# Patient Record
Sex: Male | Born: 1980
Health system: Southern US, Community
[De-identification: ages and names within clinical notes are randomized; demographics above are authoritative.]

## PROBLEM LIST (undated history)

## (undated) DIAGNOSIS — T884XXA Failed or difficult intubation, initial encounter: Secondary | ICD-10-CM

## (undated) DIAGNOSIS — J341 Cyst and mucocele of nose and nasal sinus: Secondary | ICD-10-CM

## (undated) DIAGNOSIS — I4891 Unspecified atrial fibrillation: Secondary | ICD-10-CM

## (undated) DIAGNOSIS — D649 Anemia, unspecified: Secondary | ICD-10-CM

## (undated) DIAGNOSIS — R002 Palpitations: Secondary | ICD-10-CM

## (undated) DIAGNOSIS — E236 Other disorders of pituitary gland: Secondary | ICD-10-CM

## (undated) DIAGNOSIS — H6990 Unspecified Eustachian tube disorder, unspecified ear: Secondary | ICD-10-CM

## (undated) DIAGNOSIS — K219 Gastro-esophageal reflux disease without esophagitis: Secondary | ICD-10-CM

## (undated) DIAGNOSIS — I10 Essential (primary) hypertension: Secondary | ICD-10-CM

## (undated) DIAGNOSIS — Z9989 Dependence on other enabling machines and devices: Secondary | ICD-10-CM

## (undated) DIAGNOSIS — G4733 Obstructive sleep apnea (adult) (pediatric): Secondary | ICD-10-CM

## (undated) DIAGNOSIS — E291 Testicular hypofunction: Secondary | ICD-10-CM

## (undated) DIAGNOSIS — F909 Attention-deficit hyperactivity disorder, unspecified type: Secondary | ICD-10-CM

## (undated) DIAGNOSIS — Z8619 Personal history of other infectious and parasitic diseases: Secondary | ICD-10-CM

## (undated) DIAGNOSIS — F32A Depression, unspecified: Secondary | ICD-10-CM

## (undated) DIAGNOSIS — F419 Anxiety disorder, unspecified: Secondary | ICD-10-CM

## (undated) DIAGNOSIS — G43909 Migraine, unspecified, not intractable, without status migrainosus: Secondary | ICD-10-CM

## (undated) DIAGNOSIS — E538 Deficiency of other specified B group vitamins: Secondary | ICD-10-CM

## (undated) DIAGNOSIS — R112 Nausea with vomiting, unspecified: Secondary | ICD-10-CM

## (undated) DIAGNOSIS — I498 Other specified cardiac arrhythmias: Secondary | ICD-10-CM

## (undated) DIAGNOSIS — E559 Vitamin D deficiency, unspecified: Secondary | ICD-10-CM

## (undated) DIAGNOSIS — N469 Male infertility, unspecified: Secondary | ICD-10-CM

## (undated) DIAGNOSIS — K648 Other hemorrhoids: Secondary | ICD-10-CM

## (undated) DIAGNOSIS — R7303 Prediabetes: Secondary | ICD-10-CM

## (undated) DIAGNOSIS — F329 Major depressive disorder, single episode, unspecified: Secondary | ICD-10-CM

## (undated) DIAGNOSIS — E119 Type 2 diabetes mellitus without complications: Secondary | ICD-10-CM

## (undated) DIAGNOSIS — R7989 Other specified abnormal findings of blood chemistry: Secondary | ICD-10-CM

## (undated) DIAGNOSIS — H6980 Other specified disorders of Eustachian tube, unspecified ear: Secondary | ICD-10-CM

## (undated) DIAGNOSIS — B029 Zoster without complications: Secondary | ICD-10-CM

## (undated) HISTORY — DX: Cyst and mucocele of nose and nasal sinus: J34.1

## (undated) HISTORY — DX: Gastro-esophageal reflux disease without esophagitis: K21.9

## (undated) HISTORY — DX: Vitamin D deficiency, unspecified: E55.9

## (undated) HISTORY — DX: Anxiety disorder, unspecified: F41.9

## (undated) HISTORY — DX: Unspecified eustachian tube disorder, unspecified ear: H69.90

## (undated) HISTORY — DX: Other specified disorders of Eustachian tube, unspecified ear: H69.80

## (undated) HISTORY — DX: Depression, unspecified: F32.A

## (undated) HISTORY — DX: Dependence on other enabling machines and devices: Z99.89

## (undated) HISTORY — DX: Personal history of other infectious and parasitic diseases: Z86.19

## (undated) HISTORY — DX: Attention-deficit hyperactivity disorder, unspecified type: F90.9

## (undated) HISTORY — DX: Male infertility, unspecified: N46.9

## (undated) HISTORY — DX: Other disorders of pituitary gland: E23.6

## (undated) HISTORY — PX: TONSILLECTOMY AND ADENOIDECTOMY: SUR1326

## (undated) HISTORY — DX: Palpitations: R00.2

## (undated) HISTORY — DX: Obstructive sleep apnea (adult) (pediatric): G47.33

## (undated) HISTORY — DX: Failed or difficult intubation, initial encounter: T88.4XXA

## (undated) HISTORY — DX: Migraine, unspecified, not intractable, without status migrainosus: G43.909

## (undated) HISTORY — DX: Prediabetes: R73.03

## (undated) HISTORY — DX: Anemia, unspecified: D64.9

## (undated) HISTORY — DX: Other specified abnormal findings of blood chemistry: R79.89

## (undated) HISTORY — DX: Other hemorrhoids: K64.8

## (undated) HISTORY — DX: Unspecified atrial fibrillation: I48.91

## (undated) HISTORY — DX: Testicular hypofunction: E29.1

## (undated) HISTORY — DX: Deficiency of other specified B group vitamins: E53.8

## (undated) HISTORY — DX: Major depressive disorder, single episode, unspecified: F32.9

## (undated) MED FILL — Medication: Fill #2 | Status: CN

---

## 2006-03-19 ENCOUNTER — Emergency Department (HOSPITAL_COMMUNITY): Admission: EM | Admit: 2006-03-19 | Discharge: 2006-03-19 | Payer: Self-pay | Admitting: Family Medicine

## 2009-01-23 ENCOUNTER — Encounter: Admission: RE | Admit: 2009-01-23 | Discharge: 2009-01-23 | Payer: Self-pay | Admitting: Orthopaedic Surgery

## 2009-06-02 ENCOUNTER — Ambulatory Visit: Payer: Self-pay | Admitting: Family Medicine

## 2009-06-02 DIAGNOSIS — K219 Gastro-esophageal reflux disease without esophagitis: Secondary | ICD-10-CM | POA: Insufficient documentation

## 2009-06-02 DIAGNOSIS — R0789 Other chest pain: Secondary | ICD-10-CM | POA: Insufficient documentation

## 2009-06-02 DIAGNOSIS — J029 Acute pharyngitis, unspecified: Secondary | ICD-10-CM | POA: Insufficient documentation

## 2009-06-06 ENCOUNTER — Telehealth (INDEPENDENT_AMBULATORY_CARE_PROVIDER_SITE_OTHER): Payer: Self-pay | Admitting: *Deleted

## 2009-06-07 ENCOUNTER — Encounter: Payer: Self-pay | Admitting: Family Medicine

## 2009-11-17 ENCOUNTER — Emergency Department (HOSPITAL_BASED_OUTPATIENT_CLINIC_OR_DEPARTMENT_OTHER): Admission: EM | Admit: 2009-11-17 | Discharge: 2009-11-17 | Payer: Self-pay | Admitting: Emergency Medicine

## 2009-11-17 ENCOUNTER — Ambulatory Visit: Payer: Self-pay | Admitting: Diagnostic Radiology

## 2009-12-31 ENCOUNTER — Ambulatory Visit: Payer: Self-pay | Admitting: Emergency Medicine

## 2009-12-31 DIAGNOSIS — H6980 Other specified disorders of Eustachian tube, unspecified ear: Secondary | ICD-10-CM | POA: Insufficient documentation

## 2010-01-28 ENCOUNTER — Ambulatory Visit (HOSPITAL_BASED_OUTPATIENT_CLINIC_OR_DEPARTMENT_OTHER): Admission: RE | Admit: 2010-01-28 | Payer: Self-pay | Source: Home / Self Care | Admitting: Internal Medicine

## 2010-02-11 DIAGNOSIS — J189 Pneumonia, unspecified organism: Secondary | ICD-10-CM

## 2010-02-11 HISTORY — DX: Pneumonia, unspecified organism: J18.9

## 2010-03-13 NOTE — Letter (Signed)
Summary: Out of Work  [facility]  [address]   [place], [place] [address]   Phone: [phone]  Fax: [phone]    June 02, 2009   Employee:  Molly Maduro Crabbe    To Whom It May Concern:   For Medical reasons, please excuse the above named employee from work on 06/05/2009.   If you need additional information, please feel free to contact our office.         Sincerely,    Donna Christen MD

## 2010-03-13 NOTE — Assessment & Plan Note (Signed)
Summary: CONGESTION,EAR ACHE,ALLERGY/TJ   Vital Signs:  Patient Profile:   30 Years Old Male CC:      fever this AM, left sided chest pain, bilateral ear pain, sore throat X today Height:     72 inches Weight:      360 pounds O2 Sat:      98 % O2 treatment:    Room Air Temp:     97.6 degrees F oral Pulse rate:   104 / minute Resp:     22 per minute BP sitting:   152 / 93  (right arm) Cuff size:   large  Pt. in pain?   yes    Location:   chest    Intensity:   3    Type:       dull  Vitals Entered By: Lajean Saver RN (June 02, 2009 12:20 PM)                   Updated Prior Medication List: NASONEX 50 MCG/ACT SUSP (MOMETASONE FUROATE) one spra each nostril, once daily PROTONIX 40 MG TBEC (PANTOPRAZOLE SODIUM) prn VITAMIN D 1000 UNIT TABS (CHOLECALCIFEROL) weekly DEXAMETHASONE 0.5 MG/5ML SOLN (DEXAMETHASONE) 4 gtts bilateral ears qhs  Current Allergies: ! * SEASONAL ! VICODIN (HYDROCODONE-ACETAMINOPHEN) ! * LATEXHistory of Present Illness Chief Complaint: fever this AM, left sided chest pain, bilateral ear pain, sore throat X today History of Present Illness: Subjective: Patient complains of sore throat that started this morning with a low grade fever.  He states that he has had an earache for about a month.  He complains of constant left anterior chest pain for about 2 weeks, worse with movement.  He has been sneezing for about 2 weeks and is taking Nasacort. No cough No pleuritic pain No wheezing + nasal congestion ? post-nasal drainage No sinus pain/pressure No itchy/red eyes No hemoptysis No SOB + fever/chills to 100 this AM No nausea No vomiting No abdominal pain No diarrhea No skin rashes + fatigue No myalgias No headache    REVIEW OF SYSTEMS Constitutional Symptoms      Denies fever, chills, night sweats, weight loss, weight gain, and fatigue.      Comments: dizziness Eyes       Denies change in vision, eye pain, eye discharge, glasses, contact  lenses, and eye surgery. Ear/Nose/Throat/Mouth       Complains of ear pain, sinus problems, and sore throat.      Denies hearing loss/aids, change in hearing, ear discharge, dizziness, frequent runny nose, frequent nose bleeds, hoarseness, and tooth pain or bleeding.  Respiratory       Denies dry cough, productive cough, wheezing, shortness of breath, asthma, bronchitis, and emphysema/COPD.  Cardiovascular       Complains of chest pain.      Denies murmurs and tires easily with exhertion.    Gastrointestinal       Denies stomach pain, nausea/vomiting, diarrhea, constipation, blood in bowel movements, and indigestion. Genitourniary       Denies painful urination, kidney stones, and loss of urinary control. Neurological       Denies paralysis, seizures, and fainting/blackouts. Musculoskeletal       Denies muscle pain, joint pain, joint stiffness, decreased range of motion, redness, swelling, muscle weakness, and gout.  Skin       Denies bruising, unusual mles/lumps or sores, and hair/skin or nail changes.  Psych       Denies mood changes, temper/anger issues, anxiety/stress, speech problems, depression, and sleep  problems. Other Comments: recent flare up of allergies, chest pain intermittently, bilateral ear pain, sore throat   Past History:  Past Medical History: Depression Vitmain D def Pluersy GERD chronic ear aches  Past Surgical History: Hemorrhoidectomy 2004 Tonsillectomy age 78  Family History: Family History Diabetes 1st degree relative- grandparents Grandfather-glio Parents -healthy  Social History: Occupation: Engineer, civil (consulting) Single Never Smoked Alcohol use-no Drug use-no Regular exercise-no Smoking Status:  never Drug Use:  no Does Patient Exercise:  no   Objective:  No acute distress  Eyes:  Pupils are equal, round, and reactive to light and accomdation.  Extraocular movement is intact.  Conjunctivae are not inflamed.  Ears:  Canals normal.  Tympanic membranes  normal.   Nose:  Normal septum.  Normal turbinates, mildly congested.   No sinus tenderness present.  Pharynx:  Mildly erythematous Neck:  Supple.  No adenopathy is present.  No thyromegaly is present  Lungs:  Clear to auscultation.  Breath sounds are equal.  Chest:  Distinct tenderness over the left anterior/inferior ribs Heart:  Regular rate and rhythm without murmurs, rubs, or gallops.  Abdomen:  Nontender without masses or hepatosplenomegaly.  Bowel sounds are present.  No CVA or flank tenderness.  Rapid strep test negative  EKG:  Normal Chest X-ray with left rib detail:  negative  Assessment New Problems: ACUTE PHARYNGITIS (ICD-462) OTHER CHEST PAIN (ICD-786.59) FAMILY HISTORY DIABETES 1ST DEGREE RELATIVE (ICD-V18.0) GERD (ICD-530.81) DEPRESSION (ICD-311)  SUSPECT DEVELOPING VIRAL URI WITH COSTOCHONDRITIS  Plan New Orders: T-DG Ribs Unilateral w/Chest*L* [71101] Rapid Strep [60454] T-Culture, Throat [[id number]] New Patient Level III [29562] Planning Comments:   Treat symptomatically for now.  May use Tylenol for pain.  Expectorant if develops increasing congestion.  Continue Nasonex, saline nasal spray. Throat culture pending. Follow-up with PCP if not improving.   The patient and/or caregiver has been counseled thoroughly with regard to medications prescribed including dosage, schedule, interactions, rationale for use, and possible side effects and they verbalize understanding.  Diagnoses and expected course of recovery discussed and will return if not improved as expected or if the condition worsens. Patient and/or caregiver verbalized understanding.   Laboratory Results  Date/Time Received: June 02, 2009 3:24 PM  Date/Time Reported: June 02, 2009 3:24 PM   Other Tests  Rapid Strep: negative  Kit Test Internal QC: Negative   (Normal Range: Negative)

## 2010-03-13 NOTE — Assessment & Plan Note (Signed)
Summary: EAR INFEC/TM   Vital Signs:  Patient Profile:   30 Years Old Male CC:      Rt ear pain, sore thrat x 2 day Height:     72 inches Weight:      360 pounds O2 Sat:      96 % O2 treatment:    Room Air Temp:     99.2 degrees F oral Pulse rate:   92 / minute Pulse rhythm:   regular Resp:     20 per minute BP sitting:   138 / 82  (left arm) Cuff size:   large  Vitals Entered By: Emilio Math (December 31, 2009 12:21 PM)                  Current Allergies: ! * SEASONAL ! VICODIN (HYDROCODONE-ACETAMINOPHEN) ! * SSRI ! * LATEXHistory of Present Illness Chief Complaint: Rt ear pain, sore thrat x 2 day History of Present Illness: R ear pain for a few days.  Also R sore throat.  He sees ENT and has multiple ear infections every year.  They were considering putting tubes in his ear last year but didn't happen.  Also with metabolic syndrome but A1C is good at 6.2.  Mild cough, sinus congestion, and HA.  Ibu and Benedryl helps.  Current Meds NASONEX 50 MCG/ACT SUSP (MOMETASONE FUROATE) one spra each nostril, once daily PROTONIX 40 MG TBEC (PANTOPRAZOLE SODIUM) prn DEXAMETHASONE 0.5 MG/5ML SOLN (DEXAMETHASONE) 4 gtts bilateral ears qhs ADVIL 200 MG TABS (IBUPROFEN) 4 prn AMOXICILLIN 875 MG TABS (AMOXICILLIN) 1 tab by mouth two times a day for 10 days  REVIEW OF SYSTEMS Constitutional Symptoms      Denies fever, chills, night sweats, weight loss, weight gain, and fatigue.  Eyes       Denies change in vision, eye pain, eye discharge, glasses, contact lenses, and eye surgery. Ear/Nose/Throat/Mouth       Complains of ear pain, sinus problems, and sore throat.      Denies hearing loss/aids, change in hearing, ear discharge, dizziness, frequent runny nose, frequent nose bleeds, hoarseness, and tooth pain or bleeding.  Respiratory       Complains of dry cough.      Denies productive cough, wheezing, shortness of breath, asthma, bronchitis, and emphysema/COPD.  Cardiovascular  Denies murmurs, chest pain, and tires easily with exhertion.    Gastrointestinal       Denies stomach pain, nausea/vomiting, diarrhea, constipation, blood in bowel movements, and indigestion. Genitourniary       Denies painful urination, kidney stones, and loss of urinary control. Neurological       Complains of headaches.      Denies paralysis, seizures, and fainting/blackouts. Musculoskeletal       Denies muscle pain, joint pain, joint stiffness, decreased range of motion, redness, swelling, muscle weakness, and gout.  Skin       Denies bruising, unusual mles/lumps or sores, and hair/skin or nail changes.  Psych       Denies mood changes, temper/anger issues, anxiety/stress, speech problems, depression, and sleep problems.  Past History:  Past Medical History: Depression Vitmain D def Pluersy GERD chronic ear aches Sleep apnea Metabolic syndrome  Past Surgical History: Reviewed history from 06/02/2009 and no changes required. Hemorrhoidectomy 2004 Tonsillectomy age 70  Family History: Reviewed history from 06/02/2009 and no changes required. Family History Diabetes 1st degree relative- grandparents Grandfather-glio Parents -healthy  Social History: Reviewed history from 06/02/2009 and no changes required. Occupation: Nurse Single Never  Smoked Alcohol use-no Drug use-no Regular exercise-no Physical Exam General appearance: well developed, well nourished, no acute distress Ears: R TM erythema, canal normal, L TM scarring. Oral/Pharynx: mild erythema, no exudates, no tonsils present Neck: mild swelling L ant cerv LAD and preauricular Chest/Lungs: no rales, wheezes, or rhonchi bilateral, breath sounds equal without effort Heart: regular rate and  rhythm, no murmur Skin: no obvious rashes or lesions MSE: oriented to time, place, and person Assessment New Problems: OTITIS MEDIA, ACUTE (ICD-382.9)   Patient Education: Patient and/or caregiver instructed in the  following: rest, fluids, Ibuprofen prn.  Plan New Medications/Changes: AMOXICILLIN 875 MG TABS (AMOXICILLIN) 1 tab by mouth two times a day for 10 days  #20 x 0, 12/31/2009, Hoyt Koch MD  New Orders: Est. Patient Level III [phone] Planning Comments:   1)  Take the prescribed antibiotic as instructed. 2)  Use nasal saline solution (over the counter) at least 3 times a day. 3)  Use over the counter decongestants like Zyrtec-D every 12 hours as needed to help with congestion. 4)  Can take tylenol every 6 hours or motrin every 8 hours for pain or fever. 5)  Follow up with your primary doctor  if no improvement in 5-7 days, sooner if increasing pain, fever, or new symptoms.  Follow up with your ENT is suggested due to chronic problems    The patient and/or caregiver has been counseled thoroughly with regard to medications prescribed including dosage, schedule, interactions, rationale for use, and possible side effects and they verbalize understanding.  Diagnoses and expected course of recovery discussed and will return if not improved as expected or if the condition worsens. Patient and/or caregiver verbalized understanding.  Prescriptions: AMOXICILLIN 875 MG TABS (AMOXICILLIN) 1 tab by mouth two times a day for 10 days  #20 x 0   Entered and Authorized by:   Hoyt Koch MD   Signed by:   Hoyt Koch MD on 12/31/2009   Method used:   Print then Give to Patient   RxID:   [id number]   Orders Added: 1)  Est. Patient Level III [08657]

## 2010-03-13 NOTE — Progress Notes (Signed)
  Phone Note Outgoing Call Call back at [facility] Phone [phone]   Call placed by: Lajean Saver RN,  June 06, 2009 8:57 AM Call placed to: Patient Details for Reason: Lab results Details of Action Taken: message left Summary of Call: Call placed to patinet's cell phone, no answer. I left a message telling him that his throat culture was negative and to give [place] a call if he had any questions. Lajean Saver, RN

## 2010-03-13 NOTE — Letter (Signed)
Summary: Handout Printed  Printed Handout:  - Costochondritis 

## 2010-03-28 ENCOUNTER — Encounter (HOSPITAL_BASED_OUTPATIENT_CLINIC_OR_DEPARTMENT_OTHER): Payer: Self-pay

## 2010-04-11 ENCOUNTER — Encounter (HOSPITAL_BASED_OUTPATIENT_CLINIC_OR_DEPARTMENT_OTHER): Payer: Self-pay

## 2010-04-14 ENCOUNTER — Ambulatory Visit (HOSPITAL_COMMUNITY)
Admission: RE | Admit: 2010-04-14 | Discharge: 2010-04-14 | Disposition: A | Discharge status: Home / Self Care | Payer: 59 | Source: Ambulatory Visit | Attending: Emergency Medicine | Admitting: Emergency Medicine

## 2010-04-14 ENCOUNTER — Other Ambulatory Visit: Payer: Self-pay

## 2010-04-14 DIAGNOSIS — K819 Cholecystitis, unspecified: Secondary | ICD-10-CM | POA: Insufficient documentation

## 2010-04-14 DIAGNOSIS — I77811 Abdominal aortic ectasia: Secondary | ICD-10-CM | POA: Insufficient documentation

## 2010-04-14 DIAGNOSIS — K7689 Other specified diseases of liver: Secondary | ICD-10-CM | POA: Insufficient documentation

## 2010-04-14 LAB — LIPASE, BLOOD: Lipase: 29 U/L (ref 11–59)

## 2010-04-14 LAB — COMPREHENSIVE METABOLIC PANEL
ALT: 17 U/L (ref 0–53)
Alkaline Phosphatase: 65 U/L (ref 39–117)
CO2: 23 mEq/L (ref 19–32)
Chloride: 109 mEq/L (ref 96–112)
GFR calc non Af Amer: >60 mL/min (ref >60)
Glucose, Bld: 112 mg/dL — ABNORMAL HIGH (ref 70–99)
Potassium: 4.3 mEq/L (ref 3.5–5.1)
Sodium: 139 mEq/L (ref 135–145)
Total Bilirubin: 0.4 mg/dL (ref 0.3–1.2)
Total Protein: 6.7 g/dL (ref 6.0–8.3)

## 2010-04-14 LAB — CBC
HCT: 40.9 % (ref 39.0–52.0)
Hemoglobin: 13.8 g/dL (ref 13.0–17.0)
MCH: 26.8 pg (ref 26.0–34.0)
MCV: 79.6 fL (ref 78.0–100.0)
RBC: 5.14 MIL/uL (ref 4.22–5.81)
WBC: 9 10*3/uL (ref 4.0–10.5)

## 2010-04-15 ENCOUNTER — Emergency Department (INDEPENDENT_AMBULATORY_CARE_PROVIDER_SITE_OTHER): Payer: 59

## 2010-04-15 ENCOUNTER — Emergency Department (HOSPITAL_BASED_OUTPATIENT_CLINIC_OR_DEPARTMENT_OTHER)
Admission: EM | Admit: 2010-04-15 | Discharge: 2010-04-15 | Disposition: A | Discharge status: Home / Self Care | Payer: 59 | Attending: Emergency Medicine | Admitting: Emergency Medicine

## 2010-04-15 DIAGNOSIS — Z79899 Other long term (current) drug therapy: Secondary | ICD-10-CM | POA: Insufficient documentation

## 2010-04-15 DIAGNOSIS — R1011 Right upper quadrant pain: Secondary | ICD-10-CM | POA: Insufficient documentation

## 2010-04-15 DIAGNOSIS — K219 Gastro-esophageal reflux disease without esophagitis: Secondary | ICD-10-CM | POA: Insufficient documentation

## 2010-04-15 DIAGNOSIS — E119 Type 2 diabetes mellitus without complications: Secondary | ICD-10-CM | POA: Insufficient documentation

## 2010-04-15 DIAGNOSIS — R110 Nausea: Secondary | ICD-10-CM

## 2010-04-15 DIAGNOSIS — R599 Enlarged lymph nodes, unspecified: Secondary | ICD-10-CM | POA: Insufficient documentation

## 2010-04-15 DIAGNOSIS — G4730 Sleep apnea, unspecified: Secondary | ICD-10-CM | POA: Insufficient documentation

## 2010-04-15 LAB — URINALYSIS, ROUTINE W REFLEX MICROSCOPIC
Bilirubin Urine: NEGATIVE
Ketones, ur: NEGATIVE mg/dL
Nitrite: NEGATIVE
Protein, ur: NEGATIVE mg/dL
pH: 7.5 (ref 5.0–8.0)

## 2010-04-15 LAB — CBC
HCT: 40.5 % (ref 39.0–52.0)
MCHC: 34.6 g/dL (ref 30.0–36.0)
MCV: 77.6 fL — ABNORMAL LOW (ref 78.0–100.0)
RDW: 13.3 % (ref 11.5–15.5)

## 2010-04-15 LAB — DIFFERENTIAL
Basophils Absolute: 0.1 10*3/uL (ref 0.0–0.1)
Eosinophils Absolute: 0.1 10*3/uL (ref 0.0–0.7)
Eosinophils Relative: 1 % (ref 0–5)
Lymphocytes Relative: 32 % (ref 12–46)
Monocytes Absolute: 0.6 10*3/uL (ref 0.1–1.0)

## 2010-04-15 LAB — COMPREHENSIVE METABOLIC PANEL
BUN: 14 mg/dL (ref 6–23)
Calcium: 9.3 mg/dL (ref 8.4–10.5)
Glucose, Bld: 93 mg/dL (ref 70–99)
Total Protein: 7.5 g/dL (ref 6.0–8.3)

## 2010-04-15 LAB — LIPASE, BLOOD: Lipase: 75 U/L (ref 23–300)

## 2010-04-15 MED ORDER — IOHEXOL 300 MG/ML  SOLN
100.0000 mL | Freq: Once | INTRAMUSCULAR | Status: AC | PRN
  Administered 2010-04-15: 100 mL via INTRAVENOUS

## 2010-04-24 ENCOUNTER — Encounter (HOSPITAL_BASED_OUTPATIENT_CLINIC_OR_DEPARTMENT_OTHER): Payer: Self-pay

## 2010-04-24 ENCOUNTER — Ambulatory Visit (HOSPITAL_BASED_OUTPATIENT_CLINIC_OR_DEPARTMENT_OTHER): Payer: 59 | Attending: Internal Medicine

## 2010-04-24 DIAGNOSIS — G4710 Hypersomnia, unspecified: Secondary | ICD-10-CM | POA: Insufficient documentation

## 2010-04-24 DIAGNOSIS — I472 Ventricular tachycardia, unspecified: Secondary | ICD-10-CM | POA: Insufficient documentation

## 2010-04-24 DIAGNOSIS — I4729 Other ventricular tachycardia: Secondary | ICD-10-CM | POA: Insufficient documentation

## 2010-04-24 DIAGNOSIS — I491 Atrial premature depolarization: Secondary | ICD-10-CM | POA: Insufficient documentation

## 2010-04-24 DIAGNOSIS — R0989 Other specified symptoms and signs involving the circulatory and respiratory systems: Secondary | ICD-10-CM | POA: Insufficient documentation

## 2010-04-24 DIAGNOSIS — R0609 Other forms of dyspnea: Secondary | ICD-10-CM | POA: Insufficient documentation

## 2010-04-28 DIAGNOSIS — R0989 Other specified symptoms and signs involving the circulatory and respiratory systems: Secondary | ICD-10-CM

## 2010-04-28 DIAGNOSIS — I4729 Other ventricular tachycardia: Secondary | ICD-10-CM

## 2010-04-28 DIAGNOSIS — G4730 Sleep apnea, unspecified: Secondary | ICD-10-CM

## 2010-04-28 DIAGNOSIS — G4710 Hypersomnia, unspecified: Secondary | ICD-10-CM

## 2010-04-28 DIAGNOSIS — R0609 Other forms of dyspnea: Secondary | ICD-10-CM

## 2010-04-28 DIAGNOSIS — I472 Ventricular tachycardia: Secondary | ICD-10-CM

## 2010-04-28 DIAGNOSIS — I491 Atrial premature depolarization: Secondary | ICD-10-CM

## 2012-02-27 DIAGNOSIS — D649 Anemia, unspecified: Secondary | ICD-10-CM | POA: Insufficient documentation

## 2012-03-04 HISTORY — PX: PITUITARY SURGERY: SHX203

## 2012-06-26 ENCOUNTER — Encounter: Payer: Self-pay | Admitting: Family Medicine

## 2012-07-24 ENCOUNTER — Encounter: Payer: Self-pay | Admitting: Family Medicine

## 2012-07-24 ENCOUNTER — Ambulatory Visit (INDEPENDENT_AMBULATORY_CARE_PROVIDER_SITE_OTHER): Payer: BC Managed Care – PPO | Admitting: Family Medicine

## 2012-07-24 VITALS — BP 120/72 | HR 96 | Temp 98.7°F | Wt 377.0 lb

## 2012-07-24 DIAGNOSIS — J029 Acute pharyngitis, unspecified: Secondary | ICD-10-CM

## 2012-07-24 DIAGNOSIS — H60399 Other infective otitis externa, unspecified ear: Secondary | ICD-10-CM

## 2012-07-24 DIAGNOSIS — G43909 Migraine, unspecified, not intractable, without status migrainosus: Secondary | ICD-10-CM

## 2012-07-24 DIAGNOSIS — J329 Chronic sinusitis, unspecified: Secondary | ICD-10-CM

## 2012-07-24 DIAGNOSIS — H60391 Other infective otitis externa, right ear: Secondary | ICD-10-CM

## 2012-07-24 LAB — POCT RAPID STREP A (OFFICE): Rapid Strep A Screen: NEGATIVE

## 2012-07-24 MED ORDER — TOPIRAMATE 100 MG PO TABS: 100.0000 mg | ORAL_TABLET | Freq: Every day | ORAL | Status: DC

## 2012-07-24 MED ORDER — FLUTICASONE PROPIONATE 50 MCG/ACT NA SUSP: 2.0000 | Freq: Every day | NASAL | Status: DC

## 2012-07-24 MED ORDER — CEFUROXIME AXETIL 500 MG PO TABS: 500.0000 mg | ORAL_TABLET | Freq: Two times a day (BID) | ORAL | Status: AC

## 2012-07-24 MED ORDER — NEOMYCIN-POLYMYXIN-HC 3.5-10000-1 OT SOLN: 4.0000 [drp] | Freq: Three times a day (TID) | OTIC | Status: DC

## 2012-07-24 MED ORDER — OFLOXACIN 0.3 % OT SOLN: 10.0000 [drp] | Freq: Every day | OTIC | Status: DC

## 2012-07-24 MED ORDER — MOMETASONE FUROATE 50 MCG/ACT NA SUSP: 2.0000 | Freq: Every day | NASAL | Status: DC

## 2012-07-24 NOTE — Addendum Note (Signed)
Addended by: Arnette Norris on: 07/24/2012 05:01 PM   Modules accepted: Orders

## 2012-07-24 NOTE — Addendum Note (Signed)
Addended by: Arnette Norris on: 07/24/2012 04:54 PM   Modules accepted: Orders

## 2012-07-24 NOTE — Progress Notes (Signed)
  Subjective:     Julian Washington is a 32 y.o. male who presents for evaluation of sore throat. Associated symptoms include nasal blockage, post nasal drip, sinus and nasal congestion, sore throat, swollen glands and R ear pain. Onset of symptoms was 1 week ago, and have been gradually worsening since that time. He is drinking plenty of fluids. He has not had a recent close exposure to someone with proven streptococcal pharyngitis.  The following portions of the patient's history were reviewed and updated as appropriate: allergies, current medications, past family history, past medical history, past social history, past surgical history and problem list.  Review of Systems Pertinent items are noted in HPI.    Objective:    BP 120/72  Pulse 96  Temp(Src) 98.7 F (37.1 C) (Oral)  Wt 377 lb (171.006 kg)  BMI 51.12 kg/m2  SpO2 96% General appearance: alert, cooperative, appears stated age and no distress Ears: R canal errythematous Nose: green discharge, mild congestion, turbinates red, swollen, sinus tenderness bilateral Throat: abnormal findings: mild oropharyngeal erythema Neck: mild anterior cervical adenopathy, supple, symmetrical, trachea midline and thyroid not enlarged, symmetric, no tenderness/mass/nodules Lungs: clear to auscultation bilaterally Heart: S1, S2 normal  Laboratory Strep test done. Results:negative.    Assessment:    Acute pharyngitis, likely  Sinusitis with post nasal drip. -- + otitis externa   Plan:    Patient placed on antibiotics. Follow up as needed.

## 2012-07-24 NOTE — Patient Instructions (Signed)
Otitis Externa Otitis externa is a bacterial or fungal infection of the outer ear canal. This is the area from the eardrum to the outside of the ear. Otitis externa is sometimes called "swimmer's ear." CAUSES  Possible causes of infection include:  Swimming in dirty water.  Moisture remaining in the ear after swimming or bathing.  Mild injury (trauma) to the ear.  Objects stuck in the ear (foreign body).  Cuts or scrapes (abrasions) on the outside of the ear. SYMPTOMS  The first symptom of infection is often itching in the ear canal. Later signs and symptoms may include swelling and redness of the ear canal, ear pain, and yellowish-white fluid (pus) coming from the ear. The ear pain may be worse when pulling on the earlobe. DIAGNOSIS  Your caregiver will perform a physical exam. A sample of fluid may be taken from the ear and examined for bacteria or fungi. TREATMENT  Antibiotic ear drops are often given for 10 to 14 days. Treatment may also include pain medicine or corticosteroids to reduce itching and swelling. PREVENTION   Keep your ear dry. Use the corner of a towel to absorb water out of the ear canal after swimming or bathing.  Avoid scratching or putting objects inside your ear. This can damage the ear canal or remove the protective wax that lines the canal. This makes it easier for bacteria and fungi to grow.  Avoid swimming in lakes, polluted water, or poorly chlorinated pools.  You may use ear drops made of rubbing alcohol and vinegar after swimming. Combine equal parts of white vinegar and alcohol in a bottle. Put 3 or 4 drops into each ear after swimming. HOME CARE INSTRUCTIONS   Apply antibiotic ear drops to the ear canal as prescribed by your caregiver.  Only take over-the-counter or prescription medicines for pain, discomfort, or fever as directed by your caregiver.  If you have diabetes, follow any additional treatment instructions from your caregiver.  Keep all  follow-up appointments as directed by your caregiver. SEEK MEDICAL CARE IF:   You have a fever.  Your ear is still red, swollen, painful, or draining pus after 3 days.  Your redness, swelling, or pain gets worse.  You have a severe headache.  You have redness, swelling, pain, or tenderness in the area behind your ear. MAKE SURE YOU:   Understand these instructions.  Will watch your condition.  Will get help right away if you are not doing well or get worse. Document Released: 01/28/2005 Document Revised: 04/22/2011 Document Reviewed: 02/14/2011 Exit[employer] Patient Information 2014 [employer].  

## 2012-08-06 ENCOUNTER — Ambulatory Visit: Payer: 59 | Admitting: Family Medicine

## 2012-08-12 ENCOUNTER — Ambulatory Visit (INDEPENDENT_AMBULATORY_CARE_PROVIDER_SITE_OTHER): Payer: BC Managed Care – PPO | Admitting: Family Medicine

## 2012-08-12 VITALS — BP 138/80 | HR 73 | Temp 98.0°F | Resp 16 | Ht 70.0 in | Wt 376.0 lb

## 2012-08-12 DIAGNOSIS — K625 Hemorrhage of anus and rectum: Secondary | ICD-10-CM

## 2012-08-12 DIAGNOSIS — K649 Unspecified hemorrhoids: Secondary | ICD-10-CM

## 2012-08-12 LAB — POCT CBC
HCT, POC: 40.1 % — AB (ref 43.5–53.7)
Lymph, poc: 2.4 (ref 0.6–3.4)
MCHC: 31.4 g/dL — AB (ref 31.8–35.4)
MCV: 81.6 fL (ref 80–97)
POC LYMPH PERCENT: 27.7 %L (ref 10–50)
RDW, POC: 15.4 %
WBC: 8.5 10*3/uL (ref 4.6–10.2)

## 2012-08-12 LAB — IFOBT (OCCULT BLOOD): IFOBT: POSITIVE

## 2012-08-12 NOTE — Progress Notes (Signed)
[address]   [place], [place]  [address]   [phone]  Subjective:    Patient ID: Julian Washington, male    DOB: 24-Feb-1980, 32 y.o.   MRN: [id number]  HPI This 32 y.o. male presents for evaluation of rectal bleeding.  Two nights ago, had bowel movement; with wiping had large amount of blood on toilet paper.  Then yesterday, after bowel movement with wiping good amount of blood. This morning, good amount of blood with wiping.  Also blood mixed into stool today.  Moved this weekend; lots of lifting over the weekend.  Small bowel movement, none in stool; bright red on toilet paper.  History of internal hemorrhoids several years ago; completed by [facility] in [place].  With previous hemorrhoids, mostly pain and a little blood.  Soft normal stools.  No unsusual foods. No other sites of bleeding.  Takes B12 daily; increased numbness in feet.  No abdominal pain.  No previous colonoscopy; s/p flexible sigmoidoscopy.  Enlarged lymph node on CT incidental; PA at [facility] suggested possible Crohn's.  Grandparents with polyps precancerous.  No Crohn's disease.  No ulcerative colitis.  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  PCP:  None; in between; plans to establish with Gerarda Fraction.     Review of Systems  Constitutional: Negative for fever, chills, diaphoresis and fatigue.  Gastrointestinal: Positive for abdominal pain, blood in stool and anal bleeding. Negative for nausea, vomiting, diarrhea, constipation, abdominal distention and rectal pain.  Hematological: Negative for adenopathy. Does not bruise/bleed easily.    Past Medical History  Diagnosis Date  . GERD (gastroesophageal reflux disease)   . Internal hemorrhoids     s/p banding    History reviewed. No pertinent past surgical history.  Prior to Admission medications   Medication Sig Start Date End Date Taking? Authorizing Provider  esomeprazole (NEXIUM) 40 MG capsule Take  40 mg by mouth daily before breakfast.   Yes Historical Provider, MD  mometasone (NASONEX) 50 MCG/ACT nasal spray Place 2 sprays into the nose daily. 07/24/12  Yes Yvonne R Lowne, DO  topiramate (TOPAMAX) 100 MG tablet Take 1 tablet (100 mg total) by mouth daily. 07/24/12  Yes Lelon Perla, DO  vitamin B-12 (CYANOCOBALAMIN) 1000 MCG tablet Take 1,000 mcg by mouth daily.   Yes Historical Provider, MD  neomycin-polymyxin-hydrocortisone (CORTISPORIN) otic solution Place 4 drops into the right ear 3 (three) times daily. For 10 days 07/24/12   Lelon Perla, DO  ofloxacin (FLOXIN) 0.3 % otic solution Place 10 drops into the right ear daily. 07/24/12   Lelon Perla, DO    Allergies  Allergen Reactions  . Levaquin (Levofloxacin In D5w) Swelling  . Hydrocodone-Acetaminophen     REACTION: itching  . Latex   . Oxycodone Itching    History   Social History  . Marital Status: Married    Spouse Name: N/A    Number of Children: N/A  . Years of Education: N/A   Occupational History  . Not on file.   Social History Main Topics  . Smoking status: Never Smoker   . Smokeless tobacco: Never Used  . Alcohol Use: No  . Drug Use: No  . Sexually Active: Yes   Other Topics Concern  . Not on file   Social History Narrative  . No narrative on file    Family History  Problem Relation Age of Onset  . Cancer Maternal Grandfather   .  Diabetes Paternal Grandmother   . Hypertension Paternal Grandmother   . Diabetes Paternal Grandfather   . Hypertension Paternal Grandfather        Objective:   Physical Exam  Nursing note and vitals reviewed. Constitutional: He is oriented to person, place, and time. He appears well-developed and well-nourished. No distress.  Obese  Cardiovascular: Normal rate, regular rhythm, normal heart sounds and intact distal pulses.   Pulmonary/Chest: Effort normal and breath sounds normal.  Abdominal: Soft. Bowel sounds are normal. There is no tenderness. There is no  rebound and no guarding.  Genitourinary: Rectal exam shows external hemorrhoid. Rectal exam shows no internal hemorrhoid, no fissure, no mass, no tenderness and anal tone normal.  ANAL REGION:  INFERIOR ASPECT WITH LESION 2 MM X WITH RESIDUAL BLOOD; NO ACTIVE BLEEDING; MILDLY TTP.  Neurological: He is alert and oriented to person, place, and time.  Skin: Skin is warm and dry. No rash noted. He is not diaphoretic.  Psychiatric: He has a normal mood and affect. His behavior is normal.   Results for orders placed in visit on 08/12/12  POCT CBC      Result Value Range   WBC 8.5  4.6 - 10.2 K/uL   Lymph, poc 2.4  0.6 - 3.4   POC LYMPH PERCENT 27.7  10 - 50 %L   MID (cbc) 0.4  0 - 0.9   POC MID % 5.2  0 - 12 %M   POC Granulocyte 5.7  2 - 6.9   Granulocyte percent 67.1  37 - 80 %G   RBC 4.91  4.69 - 6.13 M/uL   Hemoglobin 12.6 (*) 14.1 - 18.1 g/dL   HCT, POC 16.1 (*) 09.6 - 53.7 %   MCV 81.6  80 - 97 fL   MCH, POC 25.7 (*) 27 - 31.2 pg   MCHC 31.4 (*) 31.8 - 35.4 g/dL   RDW, POC 04.5     Platelet Count, POC 190  142 - 424 K/uL   MPV 8.8  0 - 99.8 fL  IFOBT (OCCULT BLOOD)      Result Value Range   IFOBT Positive          Assessment & Plan:  Rectal bleeding - Plan: POCT CBC, POCT SEDIMENTATION RATE, IFOBT POC (occult bld, rslt in office)  Hemorrhoids - Plan: POCT CBC, POCT SEDIMENTATION RATE, IFOBT POC (occult bld, rslt in office)   1.  Rectal bleeding:  Recurrent issue for patient; moderate amount of rectal bleeding with bowel movement.  Pt desires GI referral at this time and agreeable.  Dr. Bosie Clos can see patient today. 2.  Hemorrhoids External:  New.   Likely source of bleeding yet moderate amount of blood.  Pt declined trial of Anusol; desires GI referral and agreeable. 3. Anemia:  New onset; with acute rectal bleeding; recommend repeat CBC in upcoming 24 hours.

## 2012-08-12 NOTE — Patient Instructions (Addendum)
1. You have an appointment with Dr. Hulen Shouts office today at 3:00pm.  2. Return for repeat labs in morning or later this afternoon.

## 2012-09-01 ENCOUNTER — Other Ambulatory Visit: Payer: Self-pay | Admitting: Neurosurgery

## 2012-09-01 DIAGNOSIS — D352 Benign neoplasm of pituitary gland: Secondary | ICD-10-CM

## 2012-09-05 ENCOUNTER — Ambulatory Visit
Admission: RE | Admit: 2012-09-05 | Discharge: 2012-09-05 | Disposition: A | Discharge status: Home / Self Care | Payer: BC Managed Care – PPO | Source: Ambulatory Visit | Attending: Neurosurgery | Admitting: Neurosurgery

## 2012-09-05 DIAGNOSIS — D352 Benign neoplasm of pituitary gland: Secondary | ICD-10-CM

## 2012-09-15 ENCOUNTER — Ambulatory Visit: Admission: RE | Admit: 2012-09-15 | Payer: BC Managed Care – PPO | Source: Ambulatory Visit

## 2012-10-05 ENCOUNTER — Ambulatory Visit: Payer: Self-pay | Admitting: Family Medicine

## 2012-10-13 ENCOUNTER — Encounter: Payer: Self-pay | Admitting: Internal Medicine

## 2012-10-13 ENCOUNTER — Ambulatory Visit (INDEPENDENT_AMBULATORY_CARE_PROVIDER_SITE_OTHER): Payer: BC Managed Care – PPO | Admitting: Internal Medicine

## 2012-10-13 VITALS — BP 120/80 | HR 98 | Temp 98.2°F | Ht 71.2 in | Wt 384.0 lb

## 2012-10-13 DIAGNOSIS — F32A Depression, unspecified: Secondary | ICD-10-CM | POA: Insufficient documentation

## 2012-10-13 DIAGNOSIS — G4733 Obstructive sleep apnea (adult) (pediatric): Secondary | ICD-10-CM

## 2012-10-13 DIAGNOSIS — K648 Other hemorrhoids: Secondary | ICD-10-CM | POA: Insufficient documentation

## 2012-10-13 DIAGNOSIS — F419 Anxiety disorder, unspecified: Secondary | ICD-10-CM | POA: Insufficient documentation

## 2012-10-13 DIAGNOSIS — E291 Testicular hypofunction: Secondary | ICD-10-CM

## 2012-10-13 DIAGNOSIS — R7303 Prediabetes: Secondary | ICD-10-CM

## 2012-10-13 DIAGNOSIS — E230 Hypopituitarism: Secondary | ICD-10-CM | POA: Insufficient documentation

## 2012-10-13 DIAGNOSIS — E538 Deficiency of other specified B group vitamins: Secondary | ICD-10-CM | POA: Insufficient documentation

## 2012-10-13 DIAGNOSIS — R7309 Other abnormal glucose: Secondary | ICD-10-CM

## 2012-10-13 DIAGNOSIS — F329 Major depressive disorder, single episode, unspecified: Secondary | ICD-10-CM

## 2012-10-13 DIAGNOSIS — G43909 Migraine, unspecified, not intractable, without status migrainosus: Secondary | ICD-10-CM | POA: Insufficient documentation

## 2012-10-13 DIAGNOSIS — F341 Dysthymic disorder: Secondary | ICD-10-CM

## 2012-10-13 DIAGNOSIS — E559 Vitamin D deficiency, unspecified: Secondary | ICD-10-CM | POA: Insufficient documentation

## 2012-10-13 LAB — CBC WITH DIFFERENTIAL/PLATELET
Basophils Absolute: 0.1 10*3/uL (ref 0.0–0.1)
Basophils Relative: 0.6 % (ref 0.0–3.0)
Eosinophils Absolute: 0.1 10*3/uL (ref 0.0–0.7)
Lymphocytes Relative: 28.8 % (ref 12.0–46.0)
MCHC: 33.6 g/dL (ref 30.0–36.0)
MCV: 77 fl — ABNORMAL LOW (ref 78.0–100.0)
Monocytes Absolute: 0.5 10*3/uL (ref 0.1–1.0)
Neutrophils Relative %: 64.2 % (ref 43.0–77.0)
RBC: 4.81 Mil/uL (ref 4.22–5.81)
RDW: 15.1 % — ABNORMAL HIGH (ref 11.5–14.6)

## 2012-10-13 LAB — FOLATE: Folate: 19.6 ng/mL (ref >5.9)

## 2012-10-13 MED ORDER — TOPIRAMATE 100 MG PO TABS: 100.0000 mg | ORAL_TABLET | Freq: Every day | ORAL | Status: DC

## 2012-10-13 NOTE — Assessment & Plan Note (Signed)
Likes to get established w/ a pulmonologist, referral provided

## 2012-10-13 NOTE — Assessment & Plan Note (Signed)
F/u per urology

## 2012-10-13 NOTE — Assessment & Plan Note (Signed)
Not an issue at this point 

## 2012-10-13 NOTE — Assessment & Plan Note (Signed)
On po supplements, labs  

## 2012-10-13 NOTE — Assessment & Plan Note (Signed)
S/p bleeding ~ 2 months ago, saw a specialist, was Rx a cream, doing better, Hg was low: recheck a CBC

## 2012-10-13 NOTE — Assessment & Plan Note (Signed)
Well-controlled on Topamax, Few months ago he tried to stop Topamax but went back on it because is controlled his headaches well. Needs a refill.

## 2012-10-13 NOTE — Assessment & Plan Note (Signed)
Per endocrinology, dx w/ aic ~ 6.2, last A1C was 5.9 per pt, on metformin

## 2012-10-13 NOTE — Assessment & Plan Note (Signed)
At some point to ergocalciferol, currently on OTC vitamin D 5000 units daily. Plan: Calcium 1 gr  ( History of hypogonadism), vitamin D 600, recheck levels on return to the office.

## 2012-10-13 NOTE — Patient Instructions (Addendum)
Take approximately  calcium 1 gram  and vitamin D 600 units daily Next visit in 3 months Please get records from your previous M.D.: Most recent labs, colonoscopy if available, x-rays, procedures, most recent visits

## 2012-10-13 NOTE — Progress Notes (Signed)
  Subjective:    Patient ID: Julian Washington, male    DOB: [birth year], 32 y.o.   MRN: [id number]  HPI Here to get  establish. History of prediabetes, based on the last A1c of 5.9, he was a started on metformin, previous A1c was 5.6. History of testosterone deficit, followup by urology History of vitamin B12 deficiency, on by mouth supplements. Good compliance. History of migraines, on Topamax which helps significantly, needs a refill. History of sleep apnea, needs a new pulmonologist for reassessment of his CPAP settings. Recently seen with hemorrhoids, subsequently he saw GI. Now doing better. Using a PRN cream   Past Medical History  Diagnosis Date  . GERD (gastroesophageal reflux disease)   . Internal hemorrhoids     s/p banding  . Prediabetes     A1C 6.2 years ago  . Pituitary mass     h/o increased prolactin, f/u at [facility]  . Anxiety and depression     h/o suicidality w/ citalopram  . Hypogonadism male     sees urology  . Vitamin B 12 deficiency     h/o  . Migraine     on topamax  . OSA on CPAP   . Vitamin D deficiency    Past Surgical History  Procedure Laterality Date  . 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 Marital Status: Married    Spouse Name: N/A    Number of Children: 0  . Years of Education: N/A   Occupational History  . RN    Social History Main Topics  . Smoking status: Never Smoker   . Smokeless tobacco: Never Used  . Alcohol Use: No  . Drug Use: No  . Sexual Activity: Yes   Other Topics Concern  . Not on file   Social History Narrative  . No narrative on file   Family History  Problem Relation Age of Onset  . Prostate cancer Other     GF  . Diabetes Paternal Grandmother   . Hypertension Paternal Grandmother   . Diabetes Paternal Grandfather   . CAD Other     PGF      Review of Systems In general feeling well. Anxiety and depression are not issue at this point. Denies chest pain or  shortness or breath No  nausea, vomiting, diarrhea. Still feels some fatigue.     Objective:   Physical Exam BP 120/80  Pulse 98  Temp(Src) 98.2 F (36.8 C)  Ht 5' 11.2" (1.808 m)  Wt 384 lb (174.181 kg)  BMI 53.28 kg/m2  SpO2 98%  General -- alert, well-developed, NAD.  Lungs -- normal respiratory effort, no intercostal retractions, no accessory muscle use, and normal breath sounds.  Heart-- normal rate, regular rhythm, no murmur.    Extremities-- no pretibial edema bilaterally  Neurologic-- alert & oriented X3. Speech, gait normal.  Psych-- Cognition and judgment appear intact. Alert and cooperative with normal attention span and concentration. not anxious appearing and not depressed appearing.       Assessment & Plan:  Today , I spent more than 30 min with the patient, >50% of the time counseling, and learning his complex PMH

## 2012-10-14 ENCOUNTER — Ambulatory Visit: Payer: Self-pay

## 2012-10-14 DIAGNOSIS — D649 Anemia, unspecified: Secondary | ICD-10-CM

## 2012-10-14 LAB — FERRITIN: Ferritin: 63 ng/mL (ref 22.0–322.0)

## 2012-10-14 LAB — IBC PANEL: Iron: 51 ug/dL (ref 42–165)

## 2012-10-22 ENCOUNTER — Encounter: Payer: Self-pay | Admitting: Pulmonary Disease

## 2012-10-22 ENCOUNTER — Ambulatory Visit (INDEPENDENT_AMBULATORY_CARE_PROVIDER_SITE_OTHER): Payer: BC Managed Care – PPO | Admitting: Pulmonary Disease

## 2012-10-22 VITALS — BP 124/86 | HR 80 | Temp 98.4°F | Ht 71.0 in | Wt 385.0 lb

## 2012-10-22 DIAGNOSIS — G4733 Obstructive sleep apnea (adult) (pediatric): Secondary | ICD-10-CM

## 2012-10-22 NOTE — Progress Notes (Signed)
Chief Complaint  Patient presents with  . Sleep Consult    referred by Dr. Drue Novel for OSA. Epworth Score: 8.    History of Present Illness: Julian Washington is a 32 y.o. male for evaluation of sleep problems.  He was having trouble with migraines.  He had sleep study in [facility] in 2011.  He was told he has severe sleep apnea with SpO2 low 42%.  He was started on CPAP 15 cm H2O.  He has nasal mask, and this fits well.  He is transferring care to [facility], and needed a new sleep physician.  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  He uses [employer] for his DME.  He goes to sleep at 11 pm.  He falls asleep quickly.  He wakes up 1 time during the night to take care of his dog.  He gets out of bed at 6 am.  He feels rested in the morning since using CPAP.  He denies morning headache since using CPAP.  He does not use anything to help him fall.  He drinks 1 to 2 cups of coffee during the day.  He naps for 1 to 2 hours on Sunday >> uses his CPAP machine then.  He denies sleep walking, sleep talking, bruxism, or nightmares.  There is no history of restless legs.  He denies sleep hallucinations, sleep paralysis, or cataplexy.  The Epworth score is 8 out of 24.  Tests: CPAP 09/22/12 to 10/21/12 >> Used on 30 of 30 nights with average 7 hrs 25 min.  Average AHI 0.3 with CPAP 15 cm H2O.  Julian Washington  has a past medical history of GERD (gastroesophageal reflux disease); Internal hemorrhoids; Prediabetes; Pituitary mass; Anxiety and depression; Hypogonadism male; Vitamin B 12 deficiency; Migraine; OSA on CPAP; Vitamin D deficiency; Difficult intubation; and Retention cyst of paranasal sinus.  Julian Washington  has past surgical history that includes Pituitary surgery (02-2012) and Tonsillectomy and adenoidectomy.  Prior to Admission medications   Medication Sig Start Date End Date Taking? Authorizing Provider  esomeprazole (NEXIUM) 40 MG capsule Take 40 mg by mouth daily before breakfast.    Historical  Provider, MD  METFORMIN HCL PO Take 500 mg by mouth daily.    Historical Provider, MD  mometasone (NASONEX) 50 MCG/ACT nasal spray Place 2 sprays into the nose daily. 07/24/12   Lelon Perla, DO  topiramate (TOPAMAX) 100 MG tablet Take 1 tablet (100 mg total) by mouth daily. 10/13/12   Wanda Plump, MD  vitamin B-12 (CYANOCOBALAMIN) 1000 MCG tablet Take 1,000 mcg by mouth daily.    Historical Provider, MD    Allergies  Allergen Reactions  . Levaquin [Levofloxacin In D5w] Swelling  . Hydrocodone-Acetaminophen     REACTION: itching  . Latex   . Oxycodone Itching  . Selective Estrogen Receptor Modulators     His family history includes CAD in his other; Diabetes in his paternal grandfather and paternal grandmother; Hypertension in his paternal grandmother; Prostate cancer in his other.  He  reports that he has never smoked. He has never used smokeless tobacco. He reports that he does not drink alcohol or use illicit drugs.  Review of Systems  Constitutional: Positive for unexpected weight change. Negative for fever, chills, diaphoresis, activity change, appetite change and fatigue.  HENT: Negative for hearing loss, ear pain, nosebleeds, congestion, sore throat, facial swelling, rhinorrhea, sneezing, mouth sores, trouble swallowing, neck pain, neck stiffness, dental problem, voice change, postnasal  drip, sinus pressure, tinnitus and ear discharge.   Eyes: Negative for photophobia, discharge, itching and visual disturbance.  Respiratory: Positive for shortness of breath. Negative for apnea, cough, choking, chest tightness, wheezing and stridor.   Cardiovascular: Negative for chest pain, palpitations and leg swelling.  Gastrointestinal: Negative for nausea, vomiting, abdominal pain, constipation, blood in stool and abdominal distention.  Genitourinary: Negative for dysuria, urgency, frequency, hematuria, flank pain, decreased urine volume and difficulty urinating.  Musculoskeletal: Negative for  myalgias, back pain, joint swelling, arthralgias and gait problem.  Skin: Negative for color change, pallor and rash.  Neurological: Negative for dizziness, tremors, seizures, syncope, speech difficulty, weakness, light-headedness, numbness and headaches.  Hematological: Negative for adenopathy. Does not bruise/bleed easily.  Psychiatric/Behavioral: Negative for confusion, sleep disturbance and agitation. The patient is not nervous/anxious.    Physical Exam:  General - No distress ENT - No sinus tenderness, no oral exudate, no LAN, no thyromegaly, TM clear, pupils equal/reactive Cardiac - s1s2 regular, no murmur, pulses symmetric Chest - No wheeze/rales/dullness, good air entry, normal respiratory excursion Back - No focal tenderness Abd - Soft, non-tender, no organomegaly, + bowel sounds Ext - No edema Neuro - Normal strength, cranial nerves intact Skin - No rashes Psych - Normal mood, and behavior  Assessment/Plan:  Coralyn Helling, MD [facility] Pulmonary/Critical Care 10/22/2012, 4:14 PM Pager:  [phone] After 3pm call: [phone]

## 2012-10-22 NOTE — Assessment & Plan Note (Signed)
He has reported diagnosis of severe sleep apnea.  He has been doing very well with CPAP 15 cm H2O.  This is documented on current CPAP download.  Will ensure his DME gets new supplies for him.  We discussed how sleep apnea can affect various health problems including risks for hypertension, cardiovascular disease, and diabetes.  We also discussed how sleep disruption can increase risks for accident, such as while driving.  Weight loss as a means of improving sleep apnea was also reviewed.  Additional treatment options discussed were CPAP therapy, oral appliance, and surgical intervention.

## 2012-10-22 NOTE — Progress Notes (Deleted)
  Subjective:    Patient ID: Julian Washington, male    DOB: 1980-06-28, 32 y.o.   MRN: [id number]  HPI    Review of Systems  Constitutional: Positive for unexpected weight change. Negative for fever, chills, diaphoresis, activity change, appetite change and fatigue.  HENT: Negative for hearing loss, ear pain, nosebleeds, congestion, sore throat, facial swelling, rhinorrhea, sneezing, mouth sores, trouble swallowing, neck pain, neck stiffness, dental problem, voice change, postnasal drip, sinus pressure, tinnitus and ear discharge.   Eyes: Negative for photophobia, discharge, itching and visual disturbance.  Respiratory: Positive for shortness of breath. Negative for apnea, cough, choking, chest tightness, wheezing and stridor.   Cardiovascular: Negative for chest pain, palpitations and leg swelling.  Gastrointestinal: Negative for nausea, vomiting, abdominal pain, constipation, blood in stool and abdominal distention.  Genitourinary: Negative for dysuria, urgency, frequency, hematuria, flank pain, decreased urine volume and difficulty urinating.  Musculoskeletal: Negative for myalgias, back pain, joint swelling, arthralgias and gait problem.  Skin: Negative for color change, pallor and rash.  Neurological: Negative for dizziness, tremors, seizures, syncope, speech difficulty, weakness, light-headedness, numbness and headaches.  Hematological: Negative for adenopathy. Does not bruise/bleed easily.  Psychiatric/Behavioral: Negative for confusion, sleep disturbance and agitation. The patient is not nervous/anxious.        Objective:   Physical Exam        Assessment & Plan:

## 2012-10-22 NOTE — Patient Instructions (Signed)
Will get copy for sleep study Will arrange for new CPAP supplies Follow up in 1 year >> call if help needed sooner

## 2012-10-27 ENCOUNTER — Encounter: Payer: Self-pay | Admitting: Pulmonary Disease

## 2012-12-17 ENCOUNTER — Other Ambulatory Visit: Payer: Self-pay

## 2012-12-29 ENCOUNTER — Ambulatory Visit (INDEPENDENT_AMBULATORY_CARE_PROVIDER_SITE_OTHER): Payer: BC Managed Care – PPO | Admitting: Internal Medicine

## 2012-12-29 ENCOUNTER — Encounter: Payer: Self-pay | Admitting: Internal Medicine

## 2012-12-29 VITALS — BP 148/82 | HR 100 | Temp 97.8°F | Wt 381.0 lb

## 2012-12-29 DIAGNOSIS — J069 Acute upper respiratory infection, unspecified: Secondary | ICD-10-CM

## 2012-12-29 MED ORDER — AMOXICILLIN 500 MG PO CAPS: 1000.0000 mg | ORAL_CAPSULE | Freq: Two times a day (BID) | ORAL | Status: DC

## 2012-12-29 MED ORDER — MOMETASONE FUROATE 50 MCG/ACT NA SUSP: 2.0000 | Freq: Every day | NASAL | Status: DC

## 2012-12-29 NOTE — Patient Instructions (Addendum)
Rest, fluids , tylenol For congestion take Mucinex DM twice a day as needed  Also take your nasal spray daily  Take the antibiotic as prescribed  (Amoxicillin) if not better in 3-4 days  Call if no better in few days Call anytime if the symptoms are severe

## 2012-12-29 NOTE — Progress Notes (Signed)
Pre visit review using our clinic review tool, if applicable. No additional management support is needed unless otherwise documented below in the visit note. 

## 2012-12-29 NOTE — Progress Notes (Signed)
  Subjective:    Patient ID: Julian Washington, male    DOB: 01/28/1981, 32 y.o.   MRN: [id number]  HPI Acute visit Symptoms started 5 days ago with sinus congestion; later on developed sore throat and right ear ache. Using Nasonex. Also some burning in the upper anterior chest mostly when he takes deep breaths.  Past Medical History  Diagnosis Date  . GERD (gastroesophageal reflux disease)   . Internal hemorrhoids     s/p banding  . Prediabetes     A1C 6.2 years ago  . Pituitary mass     h/o increased prolactin, f/u at [facility]  . Anxiety and depression     h/o suicidality w/ citalopram  . Hypogonadism male     sees urology  . Vitamin B 12 deficiency     h/o  . Migraine     on topamax  . OSA on CPAP   . Vitamin D deficiency   . Difficult intubation   . 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 Tonsillectomy and adenoidectomy       Review of Systems Denies fevers or chills No cough Denies nausea, vomiting, diarrhea. No sinus pain but admits to postnasal dripping.     Objective:   Physical Exam BP 148/82  Pulse 100  Temp(Src) 97.8 F (36.6 C)  Wt 381 lb (172.82 kg)  SpO2 98% General -- alert, well-developed, NAD.  Neck -- no LAD HEENT-- Not pale. TM R slt bulge, on the L wnl; throat symmetric, mild redness , no  discharge. Face symmetric, sinuses not tender to palpation. Nose mildly  congested. Lungs -- normal respiratory effort, no intercostal retractions, no accessory muscle use, and normal breath sounds.  Heart-- normal rate, regular rhythm, no murmur.  Extremities-- no pretibial edema bilaterally  Psych-- Cognition and judgment appear intact. Cooperative with normal attention span and concentration. No anxious appearing , no depressed appearing.      Assessment & Plan:  URI, Strep a (-) See instructions

## 2013-01-13 ENCOUNTER — Ambulatory Visit (INDEPENDENT_AMBULATORY_CARE_PROVIDER_SITE_OTHER): Payer: BC Managed Care – PPO | Admitting: Internal Medicine

## 2013-01-13 ENCOUNTER — Encounter: Payer: Self-pay | Admitting: Internal Medicine

## 2013-01-13 VITALS — BP 127/78 | HR 90 | Temp 97.9°F | Wt 387.0 lb

## 2013-01-13 DIAGNOSIS — H6980 Other specified disorders of Eustachian tube, unspecified ear: Secondary | ICD-10-CM

## 2013-01-13 DIAGNOSIS — H6990 Unspecified Eustachian tube disorder, unspecified ear: Secondary | ICD-10-CM

## 2013-01-13 DIAGNOSIS — K219 Gastro-esophageal reflux disease without esophagitis: Secondary | ICD-10-CM

## 2013-01-13 DIAGNOSIS — K648 Other hemorrhoids: Secondary | ICD-10-CM

## 2013-01-13 DIAGNOSIS — G43909 Migraine, unspecified, not intractable, without status migrainosus: Secondary | ICD-10-CM

## 2013-01-13 DIAGNOSIS — E559 Vitamin D deficiency, unspecified: Secondary | ICD-10-CM

## 2013-01-13 DIAGNOSIS — E291 Testicular hypofunction: Secondary | ICD-10-CM

## 2013-01-13 MED ORDER — ESOMEPRAZOLE MAGNESIUM 40 MG PO CPDR: 40.0000 mg | DELAYED_RELEASE_CAPSULE | Freq: Every day | ORAL | Status: DC

## 2013-01-13 NOTE — Assessment & Plan Note (Signed)
Labs

## 2013-01-13 NOTE — Patient Instructions (Signed)
Get your blood work before you leave  Next visit for a physical exam, fasting, in 6 months  Please make an appointment

## 2013-01-13 NOTE — Progress Notes (Signed)
   Subjective:    Patient ID: Julian Washington, male    DOB: 07/06/1980, 32 y.o.   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  Past Medical History  Diagnosis Date  . GERD (gastroesophageal reflux disease)   . Internal hemorrhoids     s/p banding  . Prediabetes     A1C 6.2 years ago  . Pituitary mass     h/o increased prolactin, f/u at [facility]  . Anxiety and depression     h/o suicidality w/ citalopram  . Hypogonadism male     sees urology  . Vitamin B 12 deficiency     h/o  . Migraine     on topamax  . OSA on CPAP   . Vitamin D deficiency   . Difficult intubation   . 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 ETD (eustachian tube dysfunction)     s/p ENT, declined ear tuves before    Past Surgical History  Procedure Laterality Date  . 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 Tonsillectomy and adenoidectomy       Review of Systems Denies fever or ear discharge No further blood per rectum    Objective:   Physical Exam BP 127/78  Pulse 90  Temp(Src) 97.9 F (36.6 C)  Wt 387 lb (175.542 kg)  SpO2 99% General -- alert, well-developed, NAD.  HEENT--  Chronic changes in the right tympanic membrane, canal normal, negative trago sign, L TM normal. Lungs -- normal respiratory effort, no intercostal retractions, no accessory muscle use, and normal breath sounds.  Heart-- normal rate, regular rhythm, no murmur.  Psych-- Cognition and judgment appear intact. Cooperative with normal attention span and concentration. No anxious appearing , no depressed appearing.      Assessment & Plan:  Today I review more than 200 pages of old records are, many of them repetitive, only the relevant ones  will be scanned, the rest were gived  back to the patient for safe keeping. Bone density test 2011 and  2013-- negative. EGD 04/20/2010: Biopsy show chronic gastric inflammation, no Barrett's esophagus. Was recommended no followup EGD Sleep study 04/24/2010 per Dr. Maple Hudson Labs from 02/17/2011: Total cholesterol 128, triglycerides 89, HDL 58, LDL 72. TSH normal, CMP normal except for a blood sugar 116, hemoglobin 12.3, mg 1.8, A1c 5.8 Today , I spent more than 30 min with the patient, >50% of the time counseling, and /or reviewing the chart and labs ordered by other providers

## 2013-01-13 NOTE — Progress Notes (Signed)
Pre visit review using our clinic review tool, if applicable. No additional management support is needed unless otherwise documented below in the visit note. 

## 2013-01-13 NOTE — Assessment & Plan Note (Signed)
RF Nexium

## 2013-01-13 NOTE — Assessment & Plan Note (Signed)
Status post extensive eval by ENT ([facility]), At some point they recommend ear tubes, pt declined Today no evidence of acute infex,. . Recommend to see ENT if symptoms persist

## 2013-01-13 NOTE — Assessment & Plan Note (Signed)
Today the patient reports these issue is followup by endocrinology

## 2013-01-13 NOTE — Assessment & Plan Note (Signed)
Currently asymptomatic 

## 2013-01-14 ENCOUNTER — Telehealth: Payer: Self-pay | Admitting: *Deleted

## 2013-01-14 ENCOUNTER — Encounter: Payer: Self-pay | Admitting: Internal Medicine

## 2013-01-14 DIAGNOSIS — E559 Vitamin D deficiency, unspecified: Secondary | ICD-10-CM

## 2013-01-14 MED ORDER — VITAMIN D (ERGOCALCIFEROL) 1.25 MG (50000 UNIT) PO CAPS: 50000.0000 [IU] | ORAL_CAPSULE | ORAL | Status: DC

## 2013-01-14 NOTE — Telephone Encounter (Signed)
rx ordered. DJR

## 2013-01-14 NOTE — Telephone Encounter (Signed)
Message copied by Eustace Quail on Thu Jan 14, 2013  9:00 AM ------      Message from: Julian Washington      Created: Thu Jan 14, 2013  8:21 AM       Send a prescription for ergocalciferol 50,000 units one tablet every week, #12 no RF ------

## 2013-02-15 ENCOUNTER — Encounter: Payer: Self-pay | Admitting: Internal Medicine

## 2013-02-16 ENCOUNTER — Other Ambulatory Visit: Payer: Self-pay | Admitting: Internal Medicine

## 2013-02-16 MED ORDER — PANTOPRAZOLE SODIUM 40 MG PO TBEC: 40.0000 mg | DELAYED_RELEASE_TABLET | Freq: Every day | ORAL | Status: DC

## 2013-03-08 ENCOUNTER — Encounter: Payer: Self-pay | Admitting: Pulmonary Disease

## 2013-03-08 DIAGNOSIS — G4733 Obstructive sleep apnea (adult) (pediatric): Secondary | ICD-10-CM

## 2013-03-21 ENCOUNTER — Emergency Department (HOSPITAL_COMMUNITY)
Admission: EM | Admit: 2013-03-21 | Discharge: 2013-03-21 | Disposition: A | Discharge status: Home / Self Care | Payer: 59 | Source: Home / Self Care | Attending: Emergency Medicine | Admitting: Emergency Medicine

## 2013-03-21 ENCOUNTER — Encounter (HOSPITAL_COMMUNITY): Payer: Self-pay | Admitting: Emergency Medicine

## 2013-03-21 DIAGNOSIS — R1011 Right upper quadrant pain: Secondary | ICD-10-CM

## 2013-03-21 LAB — CBC WITH DIFFERENTIAL/PLATELET
BASOS ABS: 0 10*3/uL (ref 0.0–0.1)
BASOS PCT: 0 % (ref 0–1)
EOS ABS: 0.1 10*3/uL (ref 0.0–0.7)
Eosinophils Relative: 1 % (ref 0–5)
HCT: 38.6 % — ABNORMAL LOW (ref 39.0–52.0)
Hemoglobin: 12.8 g/dL — ABNORMAL LOW (ref 13.0–17.0)
Lymphocytes Relative: 28 % (ref 12–46)
Lymphs Abs: 2.9 10*3/uL (ref 0.7–4.0)
MCH: 26.2 pg (ref 26.0–34.0)
MCHC: 33.2 g/dL (ref 30.0–36.0)
MCV: 79.1 fL (ref 78.0–100.0)
Monocytes Absolute: 0.5 10*3/uL (ref 0.1–1.0)
Monocytes Relative: 5 % (ref 3–12)
NEUTROS ABS: 6.6 10*3/uL (ref 1.7–7.7)
NEUTROS PCT: 66 % (ref 43–77)
Platelets: 186 10*3/uL (ref 150–400)
RBC: 4.88 MIL/uL (ref 4.22–5.81)
RDW: 14 % (ref 11.5–15.5)
WBC: 10.1 10*3/uL (ref 4.0–10.5)

## 2013-03-21 LAB — COMPREHENSIVE METABOLIC PANEL
ALBUMIN: 3.9 g/dL (ref 3.5–5.2)
ALT: 22 U/L (ref 0–53)
AST: 15 U/L (ref 0–37)
Alkaline Phosphatase: 87 U/L (ref 39–117)
BUN: 17 mg/dL (ref 6–23)
CHLORIDE: 103 meq/L (ref 96–112)
CO2: 21 mEq/L (ref 19–32)
Calcium: 9.3 mg/dL (ref 8.4–10.5)
Creatinine, Ser: 1.12 mg/dL (ref 0.50–1.35)
GFR calc Af Amer: >90 mL/min (ref >90)
GFR calc non Af Amer: 86 mL/min — ABNORMAL LOW (ref >90)
Glucose, Bld: 108 mg/dL — ABNORMAL HIGH (ref 70–99)
POTASSIUM: 4.2 meq/L (ref 3.7–5.3)
Sodium: 138 mEq/L (ref 137–147)
TOTAL PROTEIN: 7.3 g/dL (ref 6.0–8.3)

## 2013-03-21 LAB — LIPASE, BLOOD: LIPASE: 30 U/L (ref 11–59)

## 2013-03-21 NOTE — ED Provider Notes (Signed)
CSN: [id number]     Arrival date & time 03/21/13  1628 History   First MD Initiated Contact with Patient 03/21/13 1644     Chief Complaint  Patient presents with  . Abdominal Pain   (Consider location/radiation/quality/duration/timing/severity/associated sxs/prior Treatment) HPI Comments: 33 year old male presents complaining of right upper quadrant abdominal pain for the past 4 days. For 3 days leading up to now, he has had constant dull pain in his right upper quadrant.  Yesterday, this pain became more severe. When he bends forward, the pain becomes stabbing in nature and is associated with nausea. The pain is exacerbated also by eating. He has not actually had any vomiting. He denies fever, chills, NVD. He has a history of similar condition 2 years ago, unknown cause, resolved with time. No known history of gallstones. He had a HIDA scan that showed delayed gallbladder emptying. His CT scan did not show any gallstones.  Patient is a 33 y.o. male presenting with abdominal pain.  Abdominal Pain Associated symptoms: nausea   Associated symptoms: no chest pain, no chills, no constipation, no cough, no diarrhea, no dysuria, no fatigue, no fever, no hematuria, no shortness of breath, no sore throat and no vomiting     Past Medical History  Diagnosis Date  . GERD (gastroesophageal reflux disease)   . Internal hemorrhoids     s/p banding  . Prediabetes     A1C 6.2 years ago  . Pituitary mass     h/o increased prolactin, f/u at [place] and depression     h/o suicidality w/ citalopram  . Hypogonadism male     sees urology  . Vitamin B 12 deficiency     h/o  . Migraine     on topamax  . OSA on CPAP   . Vitamin D deficiency   . Difficult intubation   . 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 ETD (eustachian tube dysfunction)     s/p ENT, declined ear tuves before    Past Surgical History  Procedure Laterality Date  . Pituitary surgery  03-04-2012     prolactinoma, ACTH  . Tonsillectomy and adenoidectomy     Family History  Problem Relation Age of Onset  . Prostate cancer Other     GF  . Diabetes Paternal Grandmother   . Hypertension Paternal Grandmother   . Diabetes Paternal Grandfather   . CAD Other     PGF   History  Substance Use Topics  . Smoking status: Never Smoker   . Smokeless tobacco: Never Used  . Alcohol Use: No    Review of Systems  Constitutional: Negative for fever, chills and fatigue.  HENT: Negative for sore throat.   Eyes: Negative for visual disturbance.  Respiratory: Negative for cough and shortness of breath.   Cardiovascular: Negative for chest pain, palpitations and leg swelling.  Gastrointestinal: Positive for nausea and abdominal pain. Negative for vomiting, diarrhea and constipation.  Genitourinary: Negative for dysuria, urgency, frequency and hematuria.  Musculoskeletal: Negative for arthralgias, myalgias, neck pain and neck stiffness.  Skin: Negative for rash.  Neurological: Negative for dizziness, weakness and light-headedness.    Allergies  Levaquin; Celexa; Hydrocodone-acetaminophen; Latex; and Oxycodone  Home Medications   Current Outpatient Rx  Name  Route  Sig  Dispense  Refill  . amoxicillin (AMOXIL) 500 MG capsule   Oral   Take 2 capsules (1,000 mg total) by mouth 2 (two) times daily.  28 capsule   0   . B Complex Vitamins (B COMPLEX-B12 PO)   Oral   Take by mouth.         . calcium-vitamin D (OSCAL WITH D) 500-200 MG-UNIT per tablet   Oral   Take 1 tablet by mouth.         . METFORMIN HCL PO   Oral   Take 500 mg by mouth daily.         . mometasone (NASONEX) 50 MCG/ACT nasal spray   Nasal   Place 2 sprays into the nose daily.   17 g   12   . pantoprazole (PROTONIX) 40 MG tablet   Oral   Take 1 tablet (40 mg total) by mouth daily.   90 tablet   3   . topiramate (TOPAMAX) 100 MG tablet   Oral   Take 1 tablet (100 mg total) by mouth daily.   90  tablet   2   . vitamin C (ASCORBIC ACID) 500 MG tablet   Oral   Take 500 mg by mouth daily.         . Vitamin D, Ergocalciferol, (DRISDOL) 50000 UNITS CAPS capsule   Oral   Take 1 capsule (50,000 Units total) by mouth every 7 (seven) days.   12 capsule   0    BP 102/68  Pulse 92  Temp(Src) 98.9 F (37.2 C) (Oral)  Resp 19  SpO2 96% Physical Exam  Nursing note and vitals reviewed. Constitutional: He is oriented to person, place, and time. He appears well-developed and well-nourished. No distress.  Morbidly obese body habitus  HENT:  Head: Normocephalic.  Cardiovascular: Normal rate, regular rhythm and normal heart sounds.  Exam reveals no gallop and no friction rub.   No murmur heard. Pulmonary/Chest: Effort normal and breath sounds normal. No respiratory distress. He has no wheezes. He has no rales.  Abdominal: There is tenderness in the right upper quadrant. There is no rigidity, no rebound, no guarding, no tenderness at McBurney's point and negative Murphy's sign.  Exam difficult due to habitus  Neurological: He is alert and oriented to person, place, and time. Coordination normal.  Skin: Skin is warm and dry. No rash noted. He is not diaphoretic.  Psychiatric: He has a normal mood and affect. Judgment normal.    ED Course  Procedures (including critical care time) Labs Review Labs Reviewed  CBC WITH DIFFERENTIAL - Abnormal; Notable for the following:    Hemoglobin 12.8 (*)    HCT 38.6 (*)    All other components within normal limits  COMPREHENSIVE METABOLIC PANEL - Abnormal; Notable for the following:    Glucose, Bld 108 (*)    Total Bilirubin <0.2 (*)    GFR calc non Af Amer 86 (*)    All other components within normal limits  LIPASE, BLOOD   Imaging Review No results found.    MDM   1. RUQ abdominal pain    White blood cell count, liver enzymes, and lipase are all normal. He will followup with his primary care physician tomorrow to arrange outpatient  followup for this.  he will come to the ED if the pain gets worse or he starts to get sick with fever, vomiting. Offered pain medicine but he prefers to just take Tylenol as needed for pain.      Liam Graham, PA-C 03/21/13 [phone]

## 2013-03-21 NOTE — ED Notes (Signed)
C/o ruq abd pain states pain is sharp, stabbing States not able to move states he gets nausea when he moves or bends down Did see [facility] last year for similar situation

## 2013-03-21 NOTE — Discharge Instructions (Signed)
Abdominal Pain, Adult °Many things can cause abdominal pain. Usually, abdominal pain is not caused by a disease and will improve without treatment. It can often be observed and treated at home. Your health care provider will do a physical exam and possibly order blood tests and X-rays to help determine the seriousness of your pain. However, in many cases, more time must pass before a clear cause of the pain can be found. Before that point, your health care provider may not know if you need more testing or further treatment. °HOME CARE INSTRUCTIONS  °Monitor your abdominal pain for any changes. The following actions may help to alleviate any discomfort you are experiencing: °· Only take over-the-counter or prescription medicines as directed by your health care provider. °· Do not take laxatives unless directed to do so by your health care provider. °· Try a clear liquid diet (broth, tea, or water) as directed by your health care provider. Slowly move to a bland diet as tolerated. °SEEK MEDICAL CARE IF: °· You have unexplained abdominal pain. °· You have abdominal pain associated with nausea or diarrhea. °· You have pain when you urinate or have a bowel movement. °· You experience abdominal pain that wakes you in the night. °· You have abdominal pain that is worsened or improved by eating food. °· You have abdominal pain that is worsened with eating fatty foods. °SEEK IMMEDIATE MEDICAL CARE IF:  °· Your pain does not go away within 2 hours. °· You have a fever. °· You keep throwing up (vomiting). °· Your pain is felt only in portions of the abdomen, such as the right side or the left lower portion of the abdomen. °· You pass bloody or black tarry stools. °MAKE SURE YOU: °· Understand these instructions.   °· Will watch your condition.   °· Will get help right away if you are not doing well or get worse.   °Document Released: 11/07/2004 Document Revised: 11/18/2012 Document Reviewed: 10/07/2012 °Exit[employer]® Patient  Information ©2014 [employer]. ° °

## 2013-03-22 NOTE — ED Provider Notes (Signed)
Medical screening examination/treatment/procedure(s) were performed by non-physician practitioner and as supervising physician I was immediately available for consultation/collaboration.  Philipp Deputy, M.D.  Harden Mo, MD 03/22/13 1049

## 2013-03-29 ENCOUNTER — Other Ambulatory Visit: Payer: Self-pay | Admitting: Internal Medicine

## 2013-03-29 DIAGNOSIS — E559 Vitamin D deficiency, unspecified: Secondary | ICD-10-CM

## 2013-03-31 ENCOUNTER — Ambulatory Visit (INDEPENDENT_AMBULATORY_CARE_PROVIDER_SITE_OTHER): Payer: 59 | Admitting: Internal Medicine

## 2013-03-31 ENCOUNTER — Encounter: Payer: Self-pay | Admitting: Internal Medicine

## 2013-03-31 VITALS — BP 120/82 | HR 92 | Temp 97.4°F | Wt 382.2 lb

## 2013-03-31 DIAGNOSIS — L03119 Cellulitis of unspecified part of limb: Secondary | ICD-10-CM

## 2013-03-31 DIAGNOSIS — L03115 Cellulitis of right lower limb: Secondary | ICD-10-CM | POA: Insufficient documentation

## 2013-03-31 DIAGNOSIS — L02419 Cutaneous abscess of limb, unspecified: Secondary | ICD-10-CM

## 2013-03-31 MED ORDER — DOXYCYCLINE HYCLATE 100 MG PO TABS
100.0000 mg | ORAL_TABLET | Freq: Two times a day (BID) | ORAL | Status: DC
Start: 2013-03-31 — End: 2013-07-12

## 2013-03-31 NOTE — Assessment & Plan Note (Signed)
Mild to mod, for antibx course,  to f/u any worsening symptoms or concerns 

## 2013-03-31 NOTE — Progress Notes (Signed)
Subjective:    Patient ID: Julian Washington, male    DOB: Jul 12, 1980, 33 y.o.   MRN: [id number]  HPI  Here with acute onset scratching to distal ant leg above right ankle a few days ago now with red/tender/swelling area about 3x3 cm, without red streaks or drainage, but with central small superficial wound, using polysporin. No fever. Past Medical History  Diagnosis Date  . GERD (gastroesophageal reflux disease)   . Internal hemorrhoids     s/p banding  . Prediabetes     A1C 6.2 years ago  . Pituitary mass     h/o increased prolactin, f/u at [facility] and depression     h/o suicidality w/ citalopram  . Hypogonadism male     sees urology  . Vitamin B 12 deficiency     h/o  . Migraine     on topamax  . OSA on CPAP   . Vitamin D deficiency   . Difficult intubation   . 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 ETD (eustachian tube dysfunction)     s/p ENT, declined ear tuves before    Past Surgical History  Procedure Laterality Date  . 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 Tonsillectomy and adenoidectomy      reports that he has never smoked. He has never used smokeless tobacco. He reports that he does not drink alcohol or use illicit drugs. family history includes CAD in his other; Diabetes in his paternal grandfather and paternal grandmother; Hypertension in his paternal grandmother; Prostate cancer in his other. Allergies  Allergen Reactions  . Levaquin [Levofloxacin In D5w] Swelling  . Celexa [Citalopram] Other (See Comments)    Mental status changes   . Hydrocodone-Acetaminophen     REACTION: itching  . Latex   . Oxycodone Itching   Current Outpatient Prescriptions on File Prior to Visit  Medication Sig Dispense Refill  . B Complex Vitamins (B COMPLEX-B12 PO) Take by mouth.      . METFORMIN HCL PO Take 500 mg by mouth daily.      . mometasone (NASONEX) 50 MCG/ACT nasal spray Place 2 sprays into the nose daily.  17 g  12    . pantoprazole (PROTONIX) 40 MG tablet Take 1 tablet (40 mg total) by mouth daily.  90 tablet  3  . topiramate (TOPAMAX) 100 MG tablet Take 1 tablet (100 mg total) by mouth daily.  90 tablet  2  . vitamin C (ASCORBIC ACID) 500 MG tablet Take 500 mg by mouth daily.      . Vitamin D, Ergocalciferol, (DRISDOL) 50000 UNITS CAPS capsule Take 1 capsule (50,000 Units total) by mouth every 7 (seven) days.  12 capsule  0   No current facility-administered medications on file prior to visit.   Review of Systems All otherwise neg per pt     Objective:   Physical Exam BP 120/82  Pulse 92  Temp(Src) 97.4 F (36.3 C) (Oral)  Wt 382 lb 4 oz (173.387 kg)  SpO2 97% VS noted, nor ill Constitutional: Pt appears well-developed and well-nourished./morbid obese  HENT: Head: NCAT.  Right Ear: External ear normal.  Left Ear: External ear normal.  Eyes: Conjunctivae and EOM are normal. Pupils are equal, round, and reactive to light.  Neck: Normal range of motion. Neck supple.  Cardiovascular: Normal rate and regular rhythm.   Pulmonary/Chest: Effort normal and breath sounds normal.  Neurological: Pt  is alert. Not confused  Skin: 3 x 3 cm area distal right leg approx 1 cm above ankle - red, tender, swelling with central superficial ulcer without drainage, no red streaks Psychiatric: Pt behavior is normal. Thought content normal.     Assessment & Plan:

## 2013-03-31 NOTE — Patient Instructions (Signed)
Please take all new medication as prescribed - the antibiotic  Please continue all other medications as before  Please have the pharmacy call with any other refills you may need.

## 2013-03-31 NOTE — Progress Notes (Signed)
Pre-visit discussion using our clinic review tool. No additional management support is needed unless otherwise documented below in the visit note.  

## 2013-04-06 MED ORDER — VITAMIN D (ERGOCALCIFEROL) 1.25 MG (50000 UNIT) PO CAPS: 50000.0000 [IU] | ORAL_CAPSULE | ORAL | Status: DC

## 2013-04-27 LAB — HM DIABETES EYE EXAM

## 2013-04-28 ENCOUNTER — Encounter: Payer: Self-pay | Admitting: Internal Medicine

## 2013-06-22 ENCOUNTER — Other Ambulatory Visit: Payer: Self-pay | Admitting: Internal Medicine

## 2013-06-23 ENCOUNTER — Telehealth: Payer: Self-pay | Admitting: *Deleted

## 2013-06-23 NOTE — Telephone Encounter (Signed)
Recommend OTC vitamin D 1000 units daily He is due  for a office visit 07/2013, be sure he has an appointment , will recheck vit D at that time

## 2013-06-23 NOTE — Telephone Encounter (Signed)
rx request - Vit D 50,000 Lab results 01/13/14 - was told to take rx for 3 months. Continue ?

## 2013-06-24 NOTE — Telephone Encounter (Signed)
Patient was informed what was stated below and voiced he understood.

## 2013-06-24 NOTE — Telephone Encounter (Signed)
Rx denied see (06-23-13) telephone encounter.//AB/CMA

## 2013-07-12 ENCOUNTER — Telehealth: Payer: Self-pay

## 2013-07-12 NOTE — Telephone Encounter (Signed)
Medication List and allergies:  Reviewed and updated  90 day supply/mail order: na Local prescriptions: [facility]  Immunizations due: Would like a Tdap today  A/P:   No changes to FH, PSH or Personal Hx Flu vaccine--work Tdap--today at visit Sees urology  To Discuss with Provider: Would like testosterone tested

## 2013-07-13 ENCOUNTER — Ambulatory Visit (INDEPENDENT_AMBULATORY_CARE_PROVIDER_SITE_OTHER): Payer: 59 | Admitting: Internal Medicine

## 2013-07-13 ENCOUNTER — Encounter: Payer: Self-pay | Admitting: Internal Medicine

## 2013-07-13 VITALS — BP 115/70 | HR 88 | Temp 97.9°F | Ht 70.0 in | Wt 359.0 lb

## 2013-07-13 DIAGNOSIS — E291 Testicular hypofunction: Secondary | ICD-10-CM

## 2013-07-13 DIAGNOSIS — K648 Other hemorrhoids: Secondary | ICD-10-CM

## 2013-07-13 DIAGNOSIS — Z23 Encounter for immunization: Secondary | ICD-10-CM

## 2013-07-13 DIAGNOSIS — R7303 Prediabetes: Secondary | ICD-10-CM

## 2013-07-13 DIAGNOSIS — E559 Vitamin D deficiency, unspecified: Secondary | ICD-10-CM

## 2013-07-13 DIAGNOSIS — K219 Gastro-esophageal reflux disease without esophagitis: Secondary | ICD-10-CM

## 2013-07-13 DIAGNOSIS — Z0000 Encounter for general adult medical examination without abnormal findings: Secondary | ICD-10-CM | POA: Insufficient documentation

## 2013-07-13 DIAGNOSIS — G43909 Migraine, unspecified, not intractable, without status migrainosus: Secondary | ICD-10-CM

## 2013-07-13 LAB — LIPID PANEL
Cholesterol: 129 mg/dL (ref 0–200)
HDL: 34.2 mg/dL — AB (ref >39.00)
LDL Cholesterol: 75 mg/dL (ref 0–99)
TRIGLYCERIDES: 101 mg/dL (ref 0.0–149.0)
Total CHOL/HDL Ratio: 4
VLDL: 20.2 mg/dL (ref 0.0–40.0)

## 2013-07-13 LAB — MAGNESIUM: Magnesium: 1.9 mg/dL (ref 1.5–2.5)

## 2013-07-13 MED ORDER — MOMETASONE FUROATE 50 MCG/ACT NA SUSP: 2.0000 | Freq: Every day | NASAL | Status: DC

## 2013-07-13 MED ORDER — TOPIRAMATE 100 MG PO TABS: 100.0000 mg | ORAL_TABLET | Freq: Every day | ORAL | Status: DC

## 2013-07-13 MED ORDER — PANTOPRAZOLE SODIUM 40 MG PO TBEC: 40.0000 mg | DELAYED_RELEASE_TABLET | Freq: Every day | ORAL | Status: DC

## 2013-07-13 NOTE — Assessment & Plan Note (Signed)
Sx resolved

## 2013-07-13 NOTE — Progress Notes (Signed)
Subjective:    Patient ID: Julian Washington, male    DOB: 1980-08-07, 33 y.o.   MRN: [id number]  DOS:  07/13/2013 Type of  Visit: CPX  We discussed other issues, see assessment and plan   ROS Diet, Exercise-- doing [employer] x 4 weeks, has lost some weight  No  CP, SOB Denies  nausea, vomiting diarrhea, blood in the stools No GERD  Sx on PPIs.  (-) cough, sputum production (-) wheezing, chest congestion No dysuria, gross hematuria, difficulty urinating  No anxiety, depression    Past Medical History  Diagnosis Date  . GERD (gastroesophageal reflux disease)   . Internal hemorrhoids     s/p banding  . Prediabetes     A1C 6.2 years ago  . Pituitary mass     h/o increased prolactin, f/u at [facility] and depression     h/o suicidality w/ citalopram  . Hypogonadism male     sees urology  . Vitamin B 12 deficiency     h/o  . Migraine     on topamax  . OSA on CPAP   . Vitamin D deficiency   . Difficult intubation   . 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 ETD (eustachian tube dysfunction)     s/p ENT, declined ear tuves before     Past Surgical History  Procedure Laterality Date  . 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 Tonsillectomy and adenoidectomy      History   Social History  . Marital Status: Married    Spouse Name: N/A    Number of Children: 0  . Years of Education: N/A   Occupational History  . 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 Smoking status: Never Smoker   . Smokeless tobacco: Never Used  . Alcohol Use: No  . Drug Use: No  . Sexual Activity: Yes   Other Topics Concern  . Not on file   Social History Narrative   Lives w/ wife     Family History  Problem Relation Age of Onset  . Prostate cancer Other     GF  . Diabetes Paternal Grandmother   . Hypertension Paternal Grandmother   . Diabetes Paternal Grandfather   . CAD Other     PGF  . Colon cancer Neg Hx       Medication  List       This list is accurate as of: 07/13/13 11:59 PM.  Always use your most recent med list.               B COMPLEX-B12 PO  Take by mouth.     CALTRATE 600+D 600-400 MG-UNIT per chew tablet  Generic drug:  Calcium Carbonate-Vitamin D  Chew 1 tablet by mouth daily.     MAG-OXIDE PO  Take 500 mg by mouth daily.     METFORMIN HCL PO  Take 500 mg by mouth 2 (two) times daily.     mometasone 50 MCG/ACT nasal spray  Commonly known as:  NASONEX  Place 2 sprays into the nose daily.     multivitamin tablet  Take 1 tablet by mouth daily. Contains 700u Vitamin D     pantoprazole 40 MG tablet  Commonly known as:  PROTONIX  Take 1 tablet (40 mg total) by mouth daily.     topiramate 100 MG tablet  Commonly known as:  TOPAMAX  Take 1 tablet (100 mg total) by mouth daily.     vitamin B-12 250 MCG tablet  Commonly known as:  CYANOCOBALAMIN  Take 250 mcg by mouth daily.           Objective:   Physical Exam BP 115/70  Pulse 88  Temp(Src) 97.9 F (36.6 C)  Ht 5\' 10"  (1.778 m)  Wt 359 lb (162.841 kg)  BMI 51.51 kg/m2  SpO2 99%  General -- alert, well-developed, NAD.  Neck --no thyromegaly  HEENT-- Not pale.  Lungs -- normal respiratory effort, no intercostal retractions, no accessory muscle use, and normal breath sounds.  Heart-- normal rate, regular rhythm, no murmur.  Abdomen-- Not distended, good bowel sounds,soft, non-tender.  Extremities-- no pretibial edema bilaterally  Neurologic--  alert & oriented X3. Speech normal, gait appropriate for age, strength symmetric and appropriate for age.  Psych-- Cognition and judgment appear intact. Cooperative with normal attention span and concentration. No anxious or depressed appearing.         Assessment & Plan:

## 2013-07-13 NOTE — Assessment & Plan Note (Signed)
Symptoms well-controlled, refill Topamax

## 2013-07-13 NOTE — Patient Instructions (Signed)
Get your blood work before you leave     Next visit is for routine check up    in 6 months  No need to come back fasting Please make an appointment     Testicular Self-Exam A self-examination of your testicles involves looking at and feeling your testicles for abnormal lumps or swelling. Several things can cause swelling, lumps, or pain in your testicles. Some of these causes are:  Injuries.  Inflammation.  Infection.  Accumulation of fluids around your testicle (hydrocele).  Twisted testicles (testicular torsion).  Testicular cancer. Self-examination of the testicles and groin areas may be advised if you are at risk for testicular cancer. Risks for testicular cancer include:  An undescended testicle (cryptorchidism).  A history of previous testicular cancer.  A family history of testicular cancer. The testicles are easiest to examine after warm baths or showers and are more difficult to examine when you are cold. This is because the muscles attached to the testicles retract and pull them up higher or into the abdomen. Follow these steps while you are standing:  Hold your penis away from your body.  Roll one testicle between your thumb and forefinger, feeling the entire testicle.  Roll the other testicle between your thumb and forefinger, feeling the entire testicle. Feel for lumps, swelling, or discomfort. A normal testicle is egg shaped and feels firm. It is smooth and not tender. The spermatic cord can be felt as a firm spaghetti-like cord at the back of your testicle. It is also important to examine the crease between the front of your leg and your abdomen. Feel for any bumps that are tender. These could be enlarged lymph nodes.  Document Released: 05/06/2000 Document Revised: 09/30/2012 Document Reviewed: 07/20/2012 [facility] Patient Information 2014 [place], [place].

## 2013-07-13 NOTE — Progress Notes (Signed)
Pre visit review using our clinic review tool, if applicable. No additional management support is needed unless otherwise documented below in the visit note. 

## 2013-07-13 NOTE — Assessment & Plan Note (Signed)
Status post ergocalciferol, currently on 750 units qd

## 2013-07-13 NOTE — Assessment & Plan Note (Addendum)
Testosterone is very low,will order a bone density test today. Reports he had previous bone density test at his other primary MD

## 2013-07-13 NOTE — Assessment & Plan Note (Signed)
Well controlled on PPIs, he developed leg cramps symptoms resolved by taking OTC mg. Plan: Check mg, refill medications

## 2013-07-13 NOTE — Assessment & Plan Note (Addendum)
Tdap today Had a hep B series  Never had a cscope Sees urology Labs from this office and at this reviewed Diet-exercise-STE: counsled

## 2013-07-14 LAB — VITAMIN D 25 HYDROXY (VIT D DEFICIENCY, FRACTURES): Vit D, 25-Hydroxy: 45 ng/mL (ref 30–89)

## 2013-09-10 ENCOUNTER — Ambulatory Visit (INDEPENDENT_AMBULATORY_CARE_PROVIDER_SITE_OTHER): Payer: 59 | Admitting: Family Medicine

## 2013-09-10 VITALS — BP 130/86 | HR 83 | Temp 98.2°F | Resp 16 | Ht 70.0 in | Wt 352.8 lb

## 2013-09-10 DIAGNOSIS — J029 Acute pharyngitis, unspecified: Secondary | ICD-10-CM

## 2013-09-10 DIAGNOSIS — H9201 Otalgia, right ear: Secondary | ICD-10-CM

## 2013-09-10 DIAGNOSIS — H65199 Other acute nonsuppurative otitis media, unspecified ear: Secondary | ICD-10-CM

## 2013-09-10 DIAGNOSIS — H9209 Otalgia, unspecified ear: Secondary | ICD-10-CM

## 2013-09-10 DIAGNOSIS — H65191 Other acute nonsuppurative otitis media, right ear: Secondary | ICD-10-CM

## 2013-09-10 DIAGNOSIS — J069 Acute upper respiratory infection, unspecified: Secondary | ICD-10-CM

## 2013-09-10 LAB — POCT RAPID STREP A (OFFICE): Rapid Strep A Screen: NEGATIVE

## 2013-09-10 MED ORDER — AMOXICILLIN 875 MG PO TABS: 875.0000 mg | ORAL_TABLET | Freq: Two times a day (BID) | ORAL | Status: DC

## 2013-09-10 MED ORDER — METHYLPREDNISOLONE 4 MG PO KIT: PACK | ORAL | Status: DC

## 2013-09-10 NOTE — Progress Notes (Signed)
 Chief Complaint:  Chief Complaint  Patient presents with  . Nasal Congestion    x3 days  . Sore Throat    x2 days  . Otalgia    R ear x1 1/2 days  . Fatigue    for the past week  . Prescriptions    If prescribed any medications today, Pt. would like to have printed prescriptions instead    HPI: Julian Washington is a 33 y.o. male who is here for 3-4 day histoyr of nasal congestion,  Sore throat, right side ear pain , clear and now white, started 2 days ago after CPAP tubing was changed,  He also started having Right ear pain about 1 day ago. 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 HAs tried otc meds without relief.   Past Medical History  Diagnosis Date  . GERD (gastroesophageal reflux disease)   . Internal hemorrhoids     s/p banding  . Prediabetes     A1C 6.2 years ago  . Pituitary mass     h/o increased prolactin, f/u at [place] and depression     h/o suicidality w/ citalopram  . Hypogonadism male     sees urology  . Vitamin B 12 deficiency     h/o  . Migraine     on topamax  . OSA on CPAP   . Vitamin D deficiency   . Difficult intubation   . 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 ETD (eustachian tube dysfunction)     s/p ENT, declined ear tuves before    Past Surgical History  Procedure Laterality Date  . 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 Tonsillectomy and adenoidectomy     History   Social History  . Marital Status: Married    Spouse Name: N/A    Number of Children: 0  . Years of Education: N/A   Occupational History  . 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 Smoking status: Never Smoker   . Smokeless tobacco: Never Used  . Alcohol Use: No  . Drug Use: No  . Sexual Activity: Yes   Other Topics Concern  . None   Social History Narrative   Lives w/ wife    Family History  Problem Relation Age of Onset  . Prostate cancer Other     GF  . Diabetes Paternal Grandmother   . Hypertension Paternal Grandmother   . Diabetes Paternal Grandfather   . CAD Other     PGF  . Colon cancer Neg Hx    Allergies  Allergen Reactions  . Levaquin [Levofloxacin In D5w] Swelling  . Celexa [Citalopram] Other (See Comments)    Mental status changes   . Hydrocodone-Acetaminophen     REACTION: itching  . Latex   . Oxycodone Itching   Prior to Admission medications   Medication Sig Start Date End Date Taking? Authorizing Provider  B Complex Vitamins (B COMPLEX-B12 PO) Take by mouth.   Yes Historical Provider, MD  Calcium Carbonate-Vitamin D (CALTRATE 600+D) 600-400 MG-UNIT per chew tablet Chew 1 tablet by mouth daily.   Yes Historical Provider, MD  Magnesium Oxide (MAG-OXIDE PO) Take 500 mg by mouth daily.   Yes Historical Provider, MD  METFORMIN HCL PO Take 1,000 mg by mouth 2 (two) times daily.    Yes Historical Provider, MD  mometasone (NASONEX) 50 MCG/ACT nasal spray Place 2 sprays into the nose daily. 07/13/13  Yes Colon Branch, MD  Multiple Vitamin (MULTIVITAMIN) tablet Take 1 tablet by mouth daily. Contains 700u Vitamin D   Yes Historical Provider, MD  pantoprazole (PROTONIX) 40 MG tablet Take 1 tablet (40 mg total) by mouth daily. 07/13/13  Yes Colon Branch, MD  topiramate (TOPAMAX) 100 MG tablet Take 1 tablet (100 mg total) by mouth daily. 07/13/13  Yes Colon Branch, MD  vitamin B-12 (CYANOCOBALAMIN) 250 MCG tablet Take 250 mcg by mouth daily.   Yes Historical Provider, MD     ROS: The patient denies  night sweats, unintentional weight loss, chest pain, palpitations, wheezing, dyspnea on exertion, nausea, vomiting, abdominal pain, dysuria, hematuria, melena, numbness,tingling.  All other systems have been reviewed and were otherwise negative with the exception of those mentioned in the HPI and as above.    PHYSICAL EXAM: Filed Vitals:   09/10/13 1719  BP:  130/86  Pulse: 83  Temp: 98.2 F (36.8 C)  Resp: 16   Filed Vitals:   09/10/13 1719  Height: 5\' 10"  (1.778 m)  Weight: 352 lb 12.8 oz (160.029 kg)   Body mass index is 50.62 kg/(m^2).  General: Alert, no acute distress, obese white male HEENT:  Normocephalic, atraumatic, oropharynx patent. EOMI, PERRLA. Right  Tm erythm and dull , no purulence Cardiovascular:  Regular rate and rhythm, no rubs murmurs or gallops.  No Carotid bruits, radial pulse intact. No pedal edema.  Respiratory: Clear to auscultation bilaterally.  No wheezes, rales, or rhonchi.  No cyanosis, no use of accessory musculature GI: No organomegaly, abdomen is soft and non-tender, positive bowel sounds.  No masses. Skin: No rashes. Neurologic: Facial musculature symmetric. Psychiatric: Patient is appropriate throughout our interaction. Lymphatic: No cervical lymphadenopathy Musculoskeletal: Gait intact.   LABS: Results for orders placed in visit on 09/10/13  POCT RAPID STREP A (OFFICE)      Result Value Ref Range   Rapid Strep A Screen Negative  Negative     EKG/XRAY:   Primary read interpreted by Dr. Marin Comment at [facility].   ASSESSMENT/PLAN: Encounter Diagnoses  Name Primary?  . Acute pharyngitis, unspecified pharyngitis type Yes  . Other acute nonsuppurative otitis media of right ear    RX amox, medrol dose pack OTC meds for fevers, chills, cough prn F/u prn  Gross sideeffects, risk and benefits, and alternatives of medications d/w patient. Patient is aware that all medications have potential sideeffects and we are unable to predict every sideeffect or drug-drug interaction that may occur.  , Dortches, DO 09/10/2013 5:47 PM

## 2013-09-10 NOTE — Patient Instructions (Signed)

## 2013-09-18 ENCOUNTER — Encounter: Payer: Self-pay | Admitting: Family Medicine

## 2013-09-28 ENCOUNTER — Telehealth: Payer: Self-pay | Admitting: Pulmonary Disease

## 2013-09-28 NOTE — Telephone Encounter (Signed)
Spoke with patient-- wanting to know if we have the capability of reading [employer] CPAP cards Advised the patient that he will need to take his card to his DME company as our program only [employer].  Nothing further needed.

## 2013-10-27 ENCOUNTER — Ambulatory Visit: Payer: 59 | Admitting: Pulmonary Disease

## 2013-11-10 ENCOUNTER — Encounter: Payer: Self-pay | Admitting: Pulmonary Disease

## 2013-11-10 ENCOUNTER — Ambulatory Visit (INDEPENDENT_AMBULATORY_CARE_PROVIDER_SITE_OTHER): Payer: 59 | Admitting: Pulmonary Disease

## 2013-11-10 VITALS — BP 128/90 | HR 88 | Ht 71.0 in | Wt 357.0 lb

## 2013-11-10 DIAGNOSIS — Z9989 Dependence on other enabling machines and devices: Principal | ICD-10-CM

## 2013-11-10 DIAGNOSIS — G4733 Obstructive sleep apnea (adult) (pediatric): Secondary | ICD-10-CM

## 2013-11-10 NOTE — Progress Notes (Signed)
Chief Complaint  Patient presents with  . Follow-up    Wears CPAP nightly. Denies problems with mask/pressure. Pt brought D/L    History of Present Illness: Julian Washington is a 33 y.o. male with moderate OSA.  He has nasal mask.  He has no issue with mask fit.  He has joined [employer].  He is off testosterone replacement while his wife is trying to get pregnant.  He gets about 7 to 7.5 hrs sleep.  He sleeps well, and feels rested.   TESTS: HST 12/09/09 ([facility]) >> AHI 27.3, SaO2 low 43% CPAP 10/11/13 to 11/09/13 >> Used on 30 of 30 nights with average 7 hrs 14 min. Average AHI 0.6 with CPAP 15 cm H2O.   Past medical hx, Past surgical hx, Medications, Allergies, Family hx, Social hx all reviewed.   Physical Exam:  General - No distress ENT - No sinus tenderness, no oral exudate, no LAN, MP 4 Cardiac - s1s2 regular, no murmur Chest - No wheeze/rales/dullness Back - No focal tenderness Abd - Soft, non-tender Ext - No edema Neuro - Normal strength Skin - No rashes Psych - normal mood, and behavior   Assessment/Plan:  Chesley Mires, MD  Pulmonary/Critical Care/Sleep Pager:  [phone]

## 2013-11-10 NOTE — Assessment & Plan Note (Signed)
He is compliant with therapy and reports benefit from CPAP.  Advised he could have pressure decreased if he wanted to >> he would like to continue with CPAP 15 cm H2O.  Will need to re-assess his CPAP needs if he continues to lose weight.  He will be due for a new machine after November 2016.  He might need new DME after he changes his health insurance provider.

## 2013-11-10 NOTE — Patient Instructions (Signed)
Follow up in 1 year.

## 2013-11-17 ENCOUNTER — Ambulatory Visit (INDEPENDENT_AMBULATORY_CARE_PROVIDER_SITE_OTHER): Payer: 59 | Admitting: Family Medicine

## 2013-11-17 VITALS — BP 116/72 | HR 90 | Temp 98.3°F | Resp 20 | Ht 71.0 in | Wt 353.6 lb

## 2013-11-17 DIAGNOSIS — L03116 Cellulitis of left lower limb: Secondary | ICD-10-CM

## 2013-11-17 MED ORDER — DOXYCYCLINE HYCLATE 100 MG PO TABS: 100.0000 mg | ORAL_TABLET | Freq: Two times a day (BID) | ORAL | Status: DC

## 2013-11-17 NOTE — Patient Instructions (Signed)
Warm compresses 4-5 times per day, start doxycycline twice per day and if not improving in next 48-72 hours - return to recheck. Return to the clinic or go to the nearest emergency room if any of your symptoms worsen or new symptoms occur. Cellulitis Cellulitis is an infection of the skin and the tissue beneath it. The infected area is usually red and tender. Cellulitis occurs most often in the arms and lower legs.  CAUSES  Cellulitis is caused by bacteria that enter the skin through cracks or cuts in the skin. The most common types of bacteria that cause cellulitis are staphylococci and streptococci. SIGNS AND SYMPTOMS   Redness and warmth.  Swelling.  Tenderness or pain.  Fever. DIAGNOSIS  Your health care provider can usually determine what is wrong based on a physical exam. Blood tests may also be done. TREATMENT  Treatment usually involves taking an antibiotic medicine. HOME CARE INSTRUCTIONS   Take your antibiotic medicine as directed by your health care provider. Finish the antibiotic even if you start to feel better.  Keep the infected arm or leg elevated to reduce swelling.  Apply a warm cloth to the affected area up to 4 times per day to relieve pain.  Take medicines only as directed by your health care provider.  Keep all follow-up visits as directed by your health care provider. SEEK MEDICAL CARE IF:   You notice red streaks coming from the infected area.  Your red area gets larger or turns dark in color.  Your bone or joint underneath the infected area becomes painful after the skin has healed.  Your infection returns in the same area or another area.  You notice a swollen bump in the infected area.  You develop new symptoms.  You have a fever. SEEK IMMEDIATE MEDICAL CARE IF:   You feel very sleepy.  You develop vomiting or diarrhea.  You have a general ill feeling (malaise) with muscle aches and pains. MAKE SURE YOU:   Understand these  instructions.  Will watch your condition.  Will get help right away if you are not doing well or get worse. Document Released: 11/07/2004 Document Revised: 06/14/2013 Document Reviewed: 04/15/2011 [facility] Patient Information 2015 [place], [place]. This information is not intended to replace advice given to you by your health care provider. Make sure you discuss any questions you have with your health care provider.

## 2013-11-17 NOTE — Progress Notes (Signed)
Subjective:    Patient ID: Julian Washington, male    DOB: [birth year], 33 y.o.   MRN: [id number]  HPI Julian Washington is a 33 y.o. male  Complains of redness on back of left leg and wounds. Noticed raised spot on back of leg for a couple of years, form rubbing from a tube sock.  [place] - as walking through - scraped back of L leg. Abrasions, but also scraped prior bump. Noticed more redness last night, more warm today.  No apparent drainage, but one area with possible pus. No fever/chills, feels well o/w.   No attempted treatments.  Had cellulitis in other shin few months ago - tolerated doxycycline then.    Patient Active Problem List   Diagnosis Date Noted  . Annual physical exam 07/13/2013  . Prediabetes   . Anxiety and depression   . Vitamin B 12 deficiency   . Hypogonadism male   . Migraine   . OSA on CPAP   . Vitamin D deficiency   . Internal hemorrhoids   . ETD (eustachian tube dysfunction) 12/31/2009  . GERD 06/02/2009   Past Medical History  Diagnosis Date  . GERD (gastroesophageal reflux disease)   . Internal hemorrhoids     s/p banding  . Prediabetes     A1C 6.2 years ago  . Pituitary mass     h/o increased prolactin, f/u at [facility] and depression     h/o suicidality w/ citalopram  . Hypogonadism male     sees urology  . Vitamin B 12 deficiency     h/o  . Migraine     on topamax  . OSA on CPAP   . Vitamin D deficiency   . Difficult intubation   . 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 ETD (eustachian tube dysfunction)     s/p ENT, declined ear tuves before    Past Surgical History  Procedure Laterality Date  . 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 Tonsillectomy and adenoidectomy     Allergies  Allergen Reactions  . Levaquin [Levofloxacin In D5w] Swelling  . Celexa [Citalopram] Other (See Comments)    Mental status changes   . Hydrocodone-Acetaminophen     REACTION: itching  . Latex     . Oxycodone Itching   Prior to Admission medications   Medication Sig Start Date End Date Taking? Authorizing Provider  B Complex Vitamins (B COMPLEX-B12 PO) Take by mouth.   Yes Historical Provider, MD  Calcium Carbonate-Vitamin D (CALTRATE 600+D) 600-400 MG-UNIT per chew tablet Chew 1 tablet by mouth daily.   Yes Historical Provider, MD  Magnesium Oxide (MAG-OXIDE PO) Take 500 mg by mouth daily.   Yes Historical Provider, MD  metFORMIN (GLUCOPHAGE-XR) 500 MG 24 hr tablet Take 1,000 mg by mouth daily with breakfast.   Yes Historical Provider, MD  mometasone (NASONEX) 50 MCG/ACT nasal spray Place 2 sprays into the nose daily. 07/13/13  Yes Colon Branch, MD  Multiple Vitamin (MULTIVITAMIN) tablet Take 1 tablet by mouth daily. Contains 700u Vitamin D   Yes Historical Provider, MD  pantoprazole (PROTONIX) 40 MG tablet Take 1 tablet (40 mg total) by mouth daily. 07/13/13  Yes Colon Branch, MD  topiramate (TOPAMAX) 100 MG tablet Take 1 tablet (100 mg total) by mouth daily. 07/13/13  Yes Colon Branch, MD  vitamin B-12 (CYANOCOBALAMIN) 250 MCG tablet Take 250 mcg  by mouth daily.   Yes Historical Provider, MD   History   Social History  . Marital Status: Married    Spouse Name: N/A    Number of Children: 0  . Years of Education: N/A   Occupational History  . 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 Smoking status: Never Smoker   . Smokeless tobacco: Never Used  . Alcohol Use: No  . Drug Use: No  . Sexual Activity: Yes   Other Topics Concern  . Not on file   Social History Narrative   Lives w/ wife       Review of Systems  Constitutional: Negative for fever and chills.  Musculoskeletal: Negative for arthralgias.  Skin: Positive for color change and wound.       Objective:   Physical Exam  Constitutional: He is oriented to person, place, and time. He appears well-developed and well-nourished. No distress.  Overweight.   Pulmonary/Chest: Effort normal.  Musculoskeletal:  Normal range of motion. He exhibits edema (lower legs bilterally with some stasis changes near ankles ).  Neurological: He is alert and oriented to person, place, and time.  Skin: Skin is warm and dry.     Psychiatric: He has a normal mood and affect. His behavior is normal.   Filed Vitals:   11/17/13 1900  BP: 116/72  Pulse: 90  Temp: 98.3 F (36.8 C)  TempSrc: Oral  Resp: 20  Height: 5\' 11"  (1.803 m)  Weight: 353 lb 9.6 oz (160.392 kg)  SpO2: 97%       Assessment & Plan:   Julian Washington is a 33 y.o. male Cellulitis of leg, left - Plan: Wound culture, doxycycline (VIBRA-TABS) 100 MG tablet  Early cellulitis as above, but no apparent abscess to incise today.  Lifted pustule, culture obtained. Start warm compresses, doxycycline, elevate legs as able and in future - support stockings may help with swelling. Working on weight loss. - commended on efforts and discussed swelling in legs should improve as weight improves. Recheck in 2-3 days if not improving - sooner if worse.   Meds ordered this encounter  Medications  . metFORMIN (GLUCOPHAGE-XR) 500 MG 24 hr tablet    Sig: Take 1,000 mg by mouth daily with breakfast.  . doxycycline (VIBRA-TABS) 100 MG tablet    Sig: Take 1 tablet (100 mg total) by mouth 2 (two) times daily.    Dispense:  20 tablet    Refill:  0   Patient Instructions  Warm compresses 4-5 times per day, start doxycycline twice per day and if not improving in next 48-72 hours - return to recheck. Return to the clinic or go to the nearest emergency room if any of your symptoms worsen or new symptoms occur. Cellulitis Cellulitis is an infection of the skin and the tissue beneath it. The infected area is usually red and tender. Cellulitis occurs most often in the arms and lower legs.  CAUSES  Cellulitis is caused by bacteria that enter the skin through cracks or cuts in the skin. The most common types of bacteria that cause cellulitis are staphylococci and  streptococci. SIGNS AND SYMPTOMS   Redness and warmth.  Swelling.  Tenderness or pain.  Fever. DIAGNOSIS  Your health care provider can usually determine what is wrong based on a physical exam. Blood tests may also be done. TREATMENT  Treatment usually involves taking an antibiotic medicine. HOME CARE INSTRUCTIONS   Take your antibiotic medicine as directed  by your health care provider. Finish the antibiotic even if you start to feel better.  Keep the infected arm or leg elevated to reduce swelling.  Apply a warm cloth to the affected area up to 4 times per day to relieve pain.  Take medicines only as directed by your health care provider.  Keep all follow-up visits as directed by your health care provider. SEEK MEDICAL CARE IF:   You notice red streaks coming from the infected area.  Your red area gets larger or turns dark in color.  Your bone or joint underneath the infected area becomes painful after the skin has healed.  Your infection returns in the same area or another area.  You notice a swollen bump in the infected area.  You develop new symptoms.  You have a fever. SEEK IMMEDIATE MEDICAL CARE IF:   You feel very sleepy.  You develop vomiting or diarrhea.  You have a general ill feeling (malaise) with muscle aches and pains. MAKE SURE YOU:   Understand these instructions.  Will watch your condition.  Will get help right away if you are not doing well or get worse. Document Released: 11/07/2004 Document Revised: 06/14/2013 Document Reviewed: 04/15/2011 [facility] Patient Information 2015 [place], [place]. This information is not intended to replace advice given to you by your health care provider. Make sure you discuss any questions you have with your health care provider.

## 2013-11-20 ENCOUNTER — Other Ambulatory Visit: Payer: Self-pay | Admitting: Family Medicine

## 2013-11-20 LAB — WOUND CULTURE
Gram Stain: NONE SEEN
Gram Stain: NONE SEEN

## 2013-11-23 ENCOUNTER — Encounter: Payer: Self-pay | Admitting: Family Medicine

## 2014-01-10 ENCOUNTER — Other Ambulatory Visit: Payer: Self-pay

## 2014-01-11 ENCOUNTER — Ambulatory Visit: Payer: 59 | Admitting: Internal Medicine

## 2014-01-15 ENCOUNTER — Ambulatory Visit (INDEPENDENT_AMBULATORY_CARE_PROVIDER_SITE_OTHER): Payer: 59 | Admitting: Family Medicine

## 2014-01-15 VITALS — BP 132/90 | HR 81 | Temp 97.8°F | Resp 20 | Ht 69.5 in | Wt 357.6 lb

## 2014-01-15 DIAGNOSIS — J0120 Acute ethmoidal sinusitis, unspecified: Secondary | ICD-10-CM

## 2014-01-15 MED ORDER — METHYLPREDNISOLONE ACETATE 80 MG/ML IJ SUSP
120.0000 mg | Freq: Once | INTRAMUSCULAR | Status: AC
  Administered 2014-01-15: 120 mg via INTRAMUSCULAR

## 2014-01-15 MED ORDER — GUAIFENESIN ER 1200 MG PO TB12: 1.0000 | ORAL_TABLET | Freq: Two times a day (BID) | ORAL | Status: DC | PRN

## 2014-01-15 MED ORDER — BENZONATATE 200 MG PO CAPS: 200.0000 mg | ORAL_CAPSULE | Freq: Three times a day (TID) | ORAL | Status: DC | PRN

## 2014-01-15 MED ORDER — MOMETASONE FUROATE 50 MCG/ACT NA SUSP: 2.0000 | Freq: Every day | NASAL | Status: DC

## 2014-01-15 MED ORDER — AMOXICILLIN-POT CLAVULANATE 875-125 MG PO TABS: 1.0000 | ORAL_TABLET | Freq: Two times a day (BID) | ORAL | Status: DC

## 2014-01-15 NOTE — Patient Instructions (Signed)

## 2014-01-15 NOTE — Progress Notes (Signed)
Subjective:  This chart was scribed for Delman Cheadle, MD by Dellis Filbert, ED Scribe at [facility].The patient was seen in exam room 12 and the patient's care was started at 11:02 AM.   Patient ID: Julian Washington, male    DOB: [birth year], 33 y.o.   MRN: [id number] Chief Complaint  Patient presents with  . Cough    dark yellow phlegm since Tuesday night  . Sore Throat  . Facial Pain    pressure  . Ear Pain    right ear   HPI HPI Comments: Julian Washington is a 33 y.o. male who presents to [facility] complaining of cough, sore throat, ear pain bilaterally and sinus pressure, onset 4 days ago. Pt states he first sinus congestion and chills at night. He has a productive cough with dark sputum. He has tried nasonex, dayquil, nyquil and mucinex with no relief. Pt has been unable to wear a cpap machine due to his complaint. Pt states his right ear hurts more than his left ear. He denies hearing loss.  Pt is pre-diabetic and does not check his blood sugar. He was prescribed an extended release metformin but states he has unable to take it because of scheduling conflicts  He has taken doxycycline in October because of cellulitis but is no longer taking it.   Pt does not want to take dextromethorphan because it makes him drowsy and gives him mental side effects.  Works at [employer].  Past Medical History  Diagnosis Date  . GERD (gastroesophageal reflux disease)   . Internal hemorrhoids     s/p banding  . Prediabetes     A1C 6.2 years ago  . Pituitary mass     h/o increased prolactin, f/u at [place] and depression     h/o suicidality w/ citalopram  . Hypogonadism male     sees urology  . Vitamin B 12 deficiency     h/o  . Migraine     on topamax  . OSA on CPAP   . Vitamin D deficiency   . Difficult intubation   . 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 ETD (eustachian tube dysfunction)     s/p ENT, declined ear tuves before     Current Outpatient Prescriptions on File Prior to Visit  Medication Sig Dispense Refill  . B Complex Vitamins (B COMPLEX-B12 PO) Take by mouth.    . Calcium Carbonate-Vitamin D (CALTRATE 600+D) 600-400 MG-UNIT per chew tablet Chew 1 tablet by mouth daily.    Marland Kitchen doxycycline (VIBRA-TABS) 100 MG tablet Take 1 tablet (100 mg total) by mouth 2 (two) times daily. 20 tablet 0  . Magnesium Oxide (MAG-OXIDE PO) Take 500 mg by mouth daily.    . metFORMIN (GLUCOPHAGE-XR) 500 MG 24 hr tablet Take 1,000 mg by mouth daily with breakfast.    . mometasone (NASONEX) 50 MCG/ACT nasal spray Place 2 sprays into the nose daily. 17 g 12  . Multiple Vitamin (MULTIVITAMIN) tablet Take 1 tablet by mouth daily. Contains 700u Vitamin D    . pantoprazole (PROTONIX) 40 MG tablet Take 1 tablet (40 mg total) by mouth daily. 90 tablet 2  . topiramate (TOPAMAX) 100 MG tablet Take 1 tablet (100 mg total) by mouth daily. 90 tablet 2  . vitamin B-12 (CYANOCOBALAMIN) 250 MCG tablet Take 250 mcg by mouth daily.     No current facility-administered medications on  file prior to visit.   Allergies  Allergen Reactions  . Levaquin [Levofloxacin In D5w] Swelling  . Celexa [Citalopram] Other (See Comments)    Mental status changes   . Hydrocodone-Acetaminophen     REACTION: itching  . Latex   . Oxycodone Itching   Review of Systems  Constitutional: Positive for chills.  HENT: Positive for congestion, ear pain, rhinorrhea, sinus pressure and sore throat.   Respiratory: Positive for cough.       Objective:  BP 132/90 mmHg  Pulse 81  Temp(Src) 97.8 F (36.6 C) (Oral)  Resp 20  Ht 5' 9.5" (1.765 m)  Wt 357 lb 9.6 oz (162.206 kg)  BMI 52.07 kg/m2  SpO2 98%  Physical Exam  Constitutional: He is oriented to person, place, and time. He appears well-developed and well-nourished.  HENT:  Head: Normocephalic and atraumatic.  Right Ear: Tympanic membrane is injected, scarred, erythematous and retracted. A middle ear effusion  is present.  Left Ear: Tympanic membrane is injected, scarred, erythematous and retracted. A middle ear effusion is present.  Nose: Mucosal edema and rhinorrhea present.  Mouth/Throat: Posterior oropharyngeal erythema present.  Eyes: EOM are normal.  Neck: Normal range of motion. No thyromegaly present.  Cardiovascular: Regular rhythm, S1 normal and S2 normal.  Tachycardia present.   Pulmonary/Chest: Effort normal and breath sounds normal. No respiratory distress. He has no wheezes. He has no rales.  Musculoskeletal: Normal range of motion.  Lymphadenopathy:    He has no cervical adenopathy.  Neurological: He is alert and oriented to person, place, and time.  Skin: Skin is warm and dry.  Psychiatric: He has a normal mood and affect. His behavior is normal.  Nursing note and vitals reviewed.     Assessment & Plan:   Acute ethmoidal sinusitis, recurrence not specified - Plan: methylPREDNISolone acetate (DEPO-MEDROL) injection 120 mg  Meds ordered this encounter  Medications  . DISCONTD: mometasone (NASONEX) 50 MCG/ACT nasal spray    Sig: Place 2 sprays into the nose daily.    Dispense:  17 g    Refill:  12  . DISCONTD: amoxicillin-clavulanate (AUGMENTIN) 875-125 MG per tablet    Sig: Take 1 tablet by mouth 2 (two) times daily.    Dispense:  20 tablet    Refill:  0  . DISCONTD: Guaifenesin (MUCINEX MAXIMUM STRENGTH) 1200 MG TB12    Sig: Take 1 tablet (1,200 mg total) by mouth every 12 (twelve) hours as needed.    Dispense:  14 tablet    Refill:  1  . DISCONTD: benzonatate (TESSALON) 200 MG capsule    Sig: Take 1 capsule (200 mg total) by mouth 3 (three) times daily as needed for cough.    Dispense:  40 capsule    Refill:  0  . methylPREDNISolone acetate (DEPO-MEDROL) injection 120 mg    Sig:     I personally performed the services described in this documentation, which was scribed in my presence. The recorded information has been reviewed and considered, and addended by me as  needed.  Delman Cheadle, MD MPH

## 2014-01-17 ENCOUNTER — Telehealth: Payer: Self-pay

## 2014-01-17 MED ORDER — FLUTICASONE PROPIONATE 50 MCG/ACT NA SUSP: 2.0000 | Freq: Every day | NASAL | Status: DC

## 2014-01-17 NOTE — Telephone Encounter (Signed)
Pharm faxed req to change nasonex to flonase, only one covered by ins. Pended for review w/same # RFs written for nasonex. Notes not completed yet, but no Hx of flonase use in past under Meds tab.

## 2014-01-17 NOTE — Telephone Encounter (Signed)
Meds ordered this encounter  Medications  . fluticasone (FLONASE) 50 MCG/ACT nasal spray    Sig: Place 2 sprays into both nostrils daily.    Dispense:  16 g    Refill:  12

## 2014-01-18 NOTE — Telephone Encounter (Signed)
Pt is not able to use Flonase because of his CPAP, he says flonase is diluted in alcohol and it dries his sinuses out real bad. He has been using OTC Nasacort.  He said if need be, he will try to use the Flonase again, but ENT has taken him off of it in the past.

## 2014-01-18 NOTE — Telephone Encounter (Signed)
Using OTC is perfect and yes the alcohol in Flonase can dry nasal passageways out.  If he ever needs a different nose spray in the future I have noted that about Flonase in his chart and then his insurance will cover a different one in the future.

## 2014-01-18 NOTE — Telephone Encounter (Signed)
Noted- pt advised to call if he needs anything else.

## 2014-01-27 ENCOUNTER — Other Ambulatory Visit: Payer: Self-pay

## 2014-01-28 ENCOUNTER — Encounter: Payer: Self-pay | Admitting: Internal Medicine

## 2014-01-28 ENCOUNTER — Ambulatory Visit (INDEPENDENT_AMBULATORY_CARE_PROVIDER_SITE_OTHER): Payer: 59 | Admitting: Internal Medicine

## 2014-01-28 VITALS — BP 132/77 | HR 52 | Temp 97.6°F | Wt 363.5 lb

## 2014-01-28 DIAGNOSIS — E236 Other disorders of pituitary gland: Secondary | ICD-10-CM

## 2014-01-28 DIAGNOSIS — G43809 Other migraine, not intractable, without status migrainosus: Secondary | ICD-10-CM

## 2014-01-28 DIAGNOSIS — Z87898 Personal history of other specified conditions: Secondary | ICD-10-CM | POA: Insufficient documentation

## 2014-01-28 DIAGNOSIS — K219 Gastro-esophageal reflux disease without esophagitis: Secondary | ICD-10-CM

## 2014-01-28 DIAGNOSIS — E538 Deficiency of other specified B group vitamins: Secondary | ICD-10-CM

## 2014-01-28 DIAGNOSIS — E291 Testicular hypofunction: Secondary | ICD-10-CM

## 2014-01-28 DIAGNOSIS — G43909 Migraine, unspecified, not intractable, without status migrainosus: Secondary | ICD-10-CM

## 2014-01-28 MED ORDER — PANTOPRAZOLE SODIUM 40 MG PO TBEC: 40.0000 mg | DELAYED_RELEASE_TABLET | Freq: Every day | ORAL | Status: DC

## 2014-01-28 MED ORDER — TOPIRAMATE 100 MG PO TABS: 100.0000 mg | ORAL_TABLET | Freq: Every day | ORAL | Status: DC

## 2014-01-28 NOTE — Progress Notes (Signed)
Pre visit review using our clinic review tool, if applicable. No additional management support is needed unless otherwise documented below in the visit note. 

## 2014-01-28 NOTE — Progress Notes (Signed)
Subjective:    Patient ID: Julian Washington, male    DOB: Jun 12, 1980, 33 y.o.   MRN: [id number]  DOS:  01/28/2014 Type of visit - description : rov Interval history: GERD, symptoms well-controlled, needs a refill on Protonix Migraines, on Topamax, doing well Prediabetes, follow up at [facility], good compliance with metformin, no apparent side effects Hypogonadism, had a bone density test done elsewhere, will bring the reports. Care everywhere labs reviewed.Marland Kitchen  ROS Denies chest pain or difficulty breathing No nausea, vomiting, diarrhea. Diet needs improvement  Past Medical History  Diagnosis Date  . GERD (gastroesophageal reflux disease)   . Internal hemorrhoids     s/p banding  . Prediabetes     A1C 6.2 years ago  . Pituitary mass     h/o increased prolactin, f/u at [place] and depression     h/o suicidality w/ citalopram  . Hypogonadism male     sees urology  . Vitamin B 12 deficiency     h/o  . Migraine     on topamax  . OSA on CPAP   . Vitamin D deficiency   . Difficult intubation   . 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 ETD (eustachian tube dysfunction)     s/p ENT, declined ear tuves before     Past Surgical History  Procedure Laterality Date  . 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 Tonsillectomy and adenoidectomy      History   Social History  . Marital Status: Married    Spouse Name: N/A    Number of Children: 0  . Years of Education: N/A   Occupational History  . 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 Smoking status: Never Smoker   . Smokeless tobacco: Never Used  . Alcohol Use: No  . Drug Use: No  . Sexual Activity: Yes   Other Topics Concern  . Not on file   Social History Narrative   Lives w/ wife        Medication List       This list is accurate as of: 01/28/14 11:59 PM.  Always use your most recent med list.               B COMPLEX-B12 PO  Take by mouth.     CALTRATE 600+D 600-400 MG-UNIT per chew tablet  Generic drug:  Calcium Carbonate-Vitamin D  Chew 1 tablet by mouth daily.     MAG-OXIDE PO  Take 500 mg by mouth daily.     metFORMIN 500 MG 24 hr tablet  Commonly known as:  GLUCOPHAGE-XR  Take 1,000 mg by mouth daily with breakfast.     multivitamin tablet  Take 1 tablet by mouth daily. Contains 700u Vitamin D     NASACORT ALLERGY 24HR NA  Place 2 sprays into the nose daily.     pantoprazole 40 MG tablet  Commonly known as:  PROTONIX  Take 1 tablet (40 mg total) by mouth daily.     topiramate 100 MG tablet  Commonly known as:  TOPAMAX  Take 1 tablet (100 mg total) by mouth daily.     vitamin B-12 250 MCG tablet  Commonly known as:  CYANOCOBALAMIN  Take 250 mcg by mouth daily.           Objective:   Physical Exam BP 132/77 mmHg  Pulse 52  Temp(Src) 97.6  F (36.4 C) (Oral)  Wt 363 lb 8 oz (164.883 kg)  SpO2 98% General -- alert, well-developed, NAD.  Lungs -- normal respiratory effort, no intercostal retractions, no accessory muscle use, and normal breath sounds.  Heart-- normal rate, regular rhythm, no murmur.  Neurologic--  alert & oriented X3. Speech normal, gait appropriate for age, strength symmetric and appropriate for age. Psych-- Cognition and judgment appear intact. Cooperative with normal attention span and concentration. No anxious or depressed appearing.        Assessment & Plan:

## 2014-01-28 NOTE — Patient Instructions (Signed)
Please come back by 07-2014 fasting for a physical exam

## 2014-01-28 NOTE — Assessment & Plan Note (Signed)
On by mouth supplements, last B12 level is satisfactory, no change

## 2014-01-28 NOTE — Assessment & Plan Note (Signed)
Refill Protonix, symptoms well-controlled

## 2014-01-28 NOTE — Assessment & Plan Note (Signed)
reports he had a bone density test and another clinic, will bring the report.

## 2014-01-28 NOTE — Assessment & Plan Note (Addendum)
RF Topamax, symptoms well-controlled

## 2014-01-31 ENCOUNTER — Telehealth: Payer: 59 | Admitting: Family

## 2014-01-31 DIAGNOSIS — R110 Nausea: Secondary | ICD-10-CM

## 2014-01-31 DIAGNOSIS — K219 Gastro-esophageal reflux disease without esophagitis: Secondary | ICD-10-CM

## 2014-01-31 MED ORDER — ONDANSETRON 4 MG PO TBDP: 4.0000 mg | ORAL_TABLET | Freq: Three times a day (TID) | ORAL | Status: DC | PRN

## 2014-01-31 MED ORDER — OMEPRAZOLE 20 MG PO CPDR: 20.0000 mg | DELAYED_RELEASE_CAPSULE | Freq: Every day | ORAL | Status: DC

## 2014-01-31 NOTE — Progress Notes (Signed)
We are sorry that you are not feeling well.  Here is how we plan to help!  Based on what you shared with me it looks like you most likely have Gastroesophageal Reflux Disease (GERD)  Gastroesophageal reflux disease (GERD) happens when acid from your stomach flows up into the esophagus.  When acid comes in contact with the esophagus, the acid causes sorenss (inflammation) in the esophagus.  Over time, GERD may create small holes (ulcers) in the lining of the esophagus.  I have prescribed Omeprazole, a Protein Pump inhibitor, 20 mg daily until you follow up with a provider. Also, zofran 4mg  every 8 hours as needed for nausea.   Your symptoms should improve in the next day or two.  You can use antacids as needed until symptoms resolve.  Call us if your heartburn worsens, you have trouble swallowing, weight loss, spitting up blood or recurrent vomiting.  Home Care:  May include lifestyle changes such as weight loss, quitting smoking and alcohol consumption  Avoid foods and drinks that make your symptoms worse, such as:  Caffeine or alcoholic drinks  Chocolate  Peppermint or mint flavorings  Garlic and onions  Spicy foods  Citrus fruits, such as oranges, lemons, or limes  Tomato-based foods such as sauce, chili, salsa and pizza  Fried and fatty foods  Avoid lying down for 3 hours prior to your bedtime or prior to taking a nap  Eat small, frequent meals instead of a large meals  Wear loose-fitting clothing.  Do not wear anything tight around your waist that causes pressure on your stomach.  Raise the head of your bed 6 to 8 inches with wood blocks to help you sleep.  Extra pillows will not help.  Seek Help Right Away If:  You have pain in your arms, neck, jaw, teeth or back  Your pain increases or changes in intensity or duration  You develop nausea, vomiting or sweating (diaphoresis)  You develop shortness of breath or you faint  Your vomit is green, yellow, black or  looks like coffee grounds or blood  Your stool is red, bloody or black  These symptoms could be signs of other problems, such as heart disease, gastric bleeding or esophageal bleeding.  Make sure you :  Understand these instructions.  Will watch your condition.  Will get help right away if you are not doing well or get worse.  Your e-visit answers were reviewed by a board certified advanced clinical practitioner to complete your personal care plan.  Depending on the condition, your plan could have included both over the counter or prescription medications.  Please review your pharmacy choice.  If there is a problem, you may call our nursing hot line at [phone] and have the prescription routed to another pharmacy.  Your safety is important to [place].  If you have drug allergies check your prescription carefully.    You can use MyChart to ask questions about today's visit, request a non-urgent call back, or ask for a work or school excuse.  You will get an e-mail in the next two days asking about your experience.  I hope that your e-visit has been valuable and will speed your recovery. Thank you for using e-visits.

## 2014-02-18 ENCOUNTER — Ambulatory Visit: Payer: Self-pay | Admitting: Internal Medicine

## 2014-02-24 ENCOUNTER — Other Ambulatory Visit: Payer: Self-pay | Admitting: Family

## 2014-02-25 ENCOUNTER — Ambulatory Visit (INDEPENDENT_AMBULATORY_CARE_PROVIDER_SITE_OTHER): Payer: 59 | Admitting: Physician Assistant

## 2014-02-25 VITALS — HR 88 | Temp 98.1°F | Resp 14

## 2014-02-25 DIAGNOSIS — R21 Rash and other nonspecific skin eruption: Secondary | ICD-10-CM

## 2014-02-25 LAB — POCT CBC
Granulocyte percent: 65.7 %G (ref 37–80)
HEMATOCRIT: 41.2 % — AB (ref 43.5–53.7)
Hemoglobin: 13.1 g/dL — AB (ref 14.1–18.1)
LYMPH, POC: 2.8 (ref 0.6–3.4)
MCH: 26.5 pg — AB (ref 27–31.2)
MCHC: 31.9 g/dL (ref 31.8–35.4)
MCV: 83 fL (ref 80–97)
MID (cbc): 0.4 (ref 0–0.9)
MPV: 7.3 fL (ref 0–99.8)
POC Granulocyte: 6.2 (ref 2–6.9)
POC LYMPH %: 29.6 % (ref 10–50)
POC MID %: 4.7 %M (ref 0–12)
Platelet Count, POC: 215 10*3/uL (ref 142–424)
RBC: 4.96 M/uL (ref 4.69–6.13)
RDW, POC: 14.9 %
WBC: 9.4 10*3/uL (ref 4.6–10.2)

## 2014-02-25 LAB — POCT RAPID STREP A (OFFICE): Rapid Strep A Screen: NEGATIVE

## 2014-02-25 MED ORDER — LORATADINE 10 MG PO TABS: 10.0000 mg | ORAL_TABLET | Freq: Every day | ORAL | Status: DC

## 2014-02-25 MED ORDER — RANITIDINE HCL 150 MG PO TABS: 150.0000 mg | ORAL_TABLET | Freq: Two times a day (BID) | ORAL | Status: DC

## 2014-02-25 NOTE — Progress Notes (Signed)
IDENTIFYING INFORMATION  Julian Washington / DOB: 01-Dec-1980 / MRN: [id number]  The patient has ETD (eustachian tube dysfunction); GERD; Prediabetes; Anxiety and depression; Vitamin B 12 deficiency; Hypogonadism male; Migraine; OSA on CPAP; Vitamin D deficiency; Internal hemorrhoids; Annual physical exam; and Pituitary mass on his problem list.  SUBJECTIVE  CC: Rash   HPI: Julian Washington is a 35 y.o. y.o. male presenting with a diffuse non tender, non pruritic erythematous rash that started within the last 48 hours.  He complains of a lesion on his left hand that is reddened and has a sharp border.  He complains of streaking up his arms and along his torso.  He denies any other symptoms. He has tried nothing for this problem.    He  has a past medical history of GERD (gastroesophageal reflux disease); Internal hemorrhoids; Prediabetes; Pituitary mass; Anxiety and depression; Hypogonadism male; Vitamin B 12 deficiency; Migraine; OSA on CPAP; Vitamin D deficiency; Difficult intubation; Retention cyst of paranasal sinus; and ETD (eustachian tube dysfunction).    He has a current medication list which includes the following prescription(s): b complex vitamins, calcium carbonate-vitamin d, magnesium oxide, metformin, multivitamin, omeprazole, ondansetron, pantoprazole, topiramate, triamcinolone acetonide, and vitamin b-12.  Julian Washington is allergic to levaquin; celexa; dextrans; flonase; hydrocodone-acetaminophen; latex; and oxycodone. He  reports that he has never smoked. He has never used smokeless tobacco. He reports that he does not drink alcohol or use illicit drugs. He  reports that he currently engages in sexual activity.  The patient  has past surgical history that includes Pituitary surgery (03-04-2012) and Tonsillectomy and adenoidectomy.  His family history includes CAD in his other; Diabetes in his paternal grandfather and paternal grandmother; Hypertension in his paternal grandmother; Prostate cancer  in his other. There is no history of Colon cancer.  Review of Systems  Constitutional: Negative for fever, chills and diaphoresis.  Respiratory: Negative.   Cardiovascular: Negative.   Gastrointestinal: Negative.   Genitourinary: Negative.   Skin: Positive for rash. Negative for itching.  Neurological: Negative.     OBJECTIVE  There were no vitals taken for this visit. The patient's body mass index is unknown because there is no weight on file.  Physical Exam  Constitutional: He is oriented to person, place, and time. He appears well-developed and well-nourished. No distress.  Cardiovascular: Normal rate and regular rhythm.   Respiratory: Effort normal and breath sounds normal.  Musculoskeletal: Normal range of motion.  Neurological: He is alert and oriented to person, place, and time.  Skin: Skin is warm and dry. Rash noted. He is not diaphoretic. There is erythema. No pallor.       Results for orders placed or performed in visit on 02/25/14 (from the past 24 hour(s))  POCT CBC     Status: Abnormal   Collection Time: 02/25/14 11:40 AM  Result Value Ref Range   WBC 9.4 4.6 - 10.2 K/uL   Lymph, poc 2.8 0.6 - 3.4   POC LYMPH PERCENT 29.6 10 - 50 %L   MID (cbc) 0.4 0 - 0.9   POC MID % 4.7 0 - 12 %M   POC Granulocyte 6.2 2 - 6.9   Granulocyte percent 65.7 37 - 80 %G   RBC 4.96 4.69 - 6.13 M/uL   Hemoglobin 13.1 (A) 14.1 - 18.1 g/dL   HCT, POC 41.2 (A) 43.5 - 53.7 %   MCV 83.0 80 - 97 fL   MCH, POC 26.5 (A) 27 - 31.2 pg  MCHC 31.9 31.8 - 35.4 g/dL   RDW, POC 14.9 %   Platelet Count, POC 215 142 - 424 K/uL   MPV 7.3 0 - 99.8 fL    ASSESSMENT & PLAN  Julian Washington was seen today for rash.  Diagnoses and associated orders for this visit:  Rash and nonspecific skin eruption - Cancel: CBC with Differential - POCT CBC - POCT rapid strep A - ranitidine (ZANTAC) 150 MG tablet; Take 1 tablet (150 mg total) by mouth 2 (two) times daily. - loratadine (CLARITIN) 10 MG tablet;  Take 1 tablet (10 mg total) by mouth daily.     The patient was instructed to to call or comeback to clinic as needed, or should symptoms warrant.  Philis Fendt, MHS, PA-C [facility] 02/25/2014 12:34 PM

## 2014-02-26 ENCOUNTER — Encounter: Payer: Self-pay | Admitting: Physician Assistant

## 2014-04-06 ENCOUNTER — Encounter: Payer: Self-pay | Admitting: Internal Medicine

## 2014-04-20 LAB — HM DIABETES EYE EXAM

## 2014-05-07 ENCOUNTER — Telehealth: Payer: 59 | Admitting: Family

## 2014-05-07 ENCOUNTER — Emergency Department (HOSPITAL_COMMUNITY)
Admission: EM | Admit: 2014-05-07 | Discharge: 2014-05-07 | Disposition: A | Discharge status: Home / Self Care | Payer: 59 | Source: Home / Self Care | Attending: Family Medicine | Admitting: Family Medicine

## 2014-05-07 ENCOUNTER — Encounter (HOSPITAL_COMMUNITY): Payer: Self-pay | Admitting: Emergency Medicine

## 2014-05-07 DIAGNOSIS — H109 Unspecified conjunctivitis: Secondary | ICD-10-CM | POA: Diagnosis not present

## 2014-05-07 DIAGNOSIS — H538 Other visual disturbances: Secondary | ICD-10-CM

## 2014-05-07 DIAGNOSIS — S0592XA Unspecified injury of left eye and orbit, initial encounter: Secondary | ICD-10-CM

## 2014-05-07 MED ORDER — TOBRAMYCIN-DEXAMETHASONE 0.3-0.1 % OP OINT: 1.0000 "application " | TOPICAL_OINTMENT | Freq: Three times a day (TID) | OPHTHALMIC | Status: DC

## 2014-05-07 MED ORDER — TETRACAINE HCL 0.5 % OP SOLN
OPHTHALMIC | Status: AC
  Filled 2014-05-07: qty 2

## 2014-05-07 NOTE — Discharge Instructions (Signed)
Conjunctivitis Call the opthalmologist tomorrow afternoon Conjunctivitis is commonly called "pink eye." Conjunctivitis can be caused by bacterial or viral infection, allergies, or injuries. There is usually redness of the lining of the eye, itching, discomfort, and sometimes discharge. There may be deposits of matter along the eyelids. A viral infection usually causes a watery discharge, while a bacterial infection causes a yellowish, thick discharge. Pink eye is very contagious and spreads by direct contact. You may be given antibiotic eyedrops as part of your treatment. Before using your eye medicine, remove all drainage from the eye by washing gently with warm water and cotton balls. Continue to use the medication until you have awakened 2 mornings in a row without discharge from the eye. Do not rub your eye. This increases the irritation and helps spread infection. Use separate towels from other household members. Wash your hands with soap and water before and after touching your eyes. Use cold compresses to reduce pain and sunglasses to relieve irritation from light. Do not wear contact lenses or wear eye makeup until the infection is gone. SEEK MEDICAL CARE IF:   Your symptoms are not better after 3 days of treatment.  You have increased pain or trouble seeing.  The outer eyelids become very red or swollen. Document Released: 03/07/2004 Document Revised: 04/22/2011 Document Reviewed: 01/28/2005 [facility] Patient Information 2015 [place], [place]. This information is not intended to replace advice given to you by your health care provider. Make sure you discuss any questions you have with your health care provider.  Blurred Vision You have been seen today complaining of blurred vision. This means you have a loss of ability to see small details.  CAUSES  Blurred vision can be a symptom of underlying eye problems, such as:  Aging of the eye (presbyopia).  Glaucoma.  Cataracts.  Eye  infection.  Eye-related migraine.  Diabetes mellitus.  Fatigue.  Migraine headaches.  High blood pressure.  Breakdown of the back of the eye (macular degeneration).  Problems caused by some medications. The most common cause of blurred vision is the need for eyeglasses or a new prescription. Today in the emergency department, no cause for your blurred vision can be found. SYMPTOMS  Blurred vision is the loss of visual sharpness and detail (acuity). DIAGNOSIS  Should blurred vision continue, you should see your caregiver. If your caregiver is your primary care physician, he or she may choose to refer you to another specialist.  TREATMENT  Do not ignore your blurred vision. Make sure to have it checked out to see if further treatment or referral is necessary. SEEK MEDICAL CARE IF:  You are unable to get into a specialist so we can help you with a referral. SEEK IMMEDIATE MEDICAL CARE IF: You have severe eye pain, severe headache, or sudden loss of vision. MAKE SURE YOU:   Understand these instructions.  Will watch your condition.  Will get help right away if you are not doing well or get worse. Document Released: 01/31/2003 Document Revised: 04/22/2011 Document Reviewed: 09/02/2007 [facility] Patient Information 2015 [place], [place]. This information is not intended to replace advice given to you by your health care provider. Make sure you discuss any questions you have with your health care provider.

## 2014-05-07 NOTE — ED Notes (Signed)
C/o  Irritation of the left eye x 2 days.  Pt has tried otc allergy meds with no relief.  Denies fever, n/v/d.

## 2014-05-07 NOTE — ED Provider Notes (Signed)
CSN: [id number]     Arrival date & time 05/07/14  1755 History   First MD Initiated Contact with Patient 05/07/14 1837     Chief Complaint  Patient presents with  . Eye Problem   (Consider location/radiation/quality/duration/timing/severity/associated sxs/prior Treatment) HPI Comments:  34 year old male states that after mowing the yard 3-4 days ago he experienced some redness and soreness of the upper and lower left eyelids  2 days later. Approximately 2 days ago he developed intermittent visual problems that he had difficulty describing. His initial description sounded as though he was having a scotoma in the right field of vision of the left eye. He states it was difficult to describe the change in vision. Finally he agreed that the visual problem was actually a blurring of vision to the left eye in the 2 to 4:00 position. This has been intermittent. Denies problems associated to the right eye.   Past Medical History  Diagnosis Date  . GERD (gastroesophageal reflux disease)   . Internal hemorrhoids     s/p banding  . Prediabetes     A1C 6.2 years ago  . Pituitary mass     h/o increased prolactin, f/u at [facility] and depression     h/o suicidality w/ citalopram  . Hypogonadism male     sees urology  . Vitamin B 12 deficiency     h/o  . Migraine     on topamax  . OSA on CPAP   . Vitamin D deficiency   . Difficult intubation   . 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 ETD (eustachian tube dysfunction)     s/p ENT, declined ear tuves before    Past Surgical History  Procedure Laterality Date  . 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 Tonsillectomy and adenoidectomy     Family History  Problem Relation Age of Onset  . Prostate cancer Other     GF  . Diabetes Paternal Grandmother   . Hypertension Paternal Grandmother   . Diabetes Paternal Grandfather   . CAD Other     PGF  . Colon cancer Neg Hx    History  Substance Use  Topics  . Smoking status: Never Smoker   . Smokeless tobacco: Never Used  . Alcohol Use: No    Review of Systems  Constitutional: Negative.   HENT: Negative.   Eyes: Positive for pain, redness and visual disturbance. Negative for discharge.  Neurological: Negative.     Allergies  Levaquin; Celexa; Dextrans; Flonase; Hydrocodone-acetaminophen; Latex; and Oxycodone  Home Medications   Prior to Admission medications   Medication Sig Start Date End Date Taking? Authorizing Provider  B Complex Vitamins (B COMPLEX-B12 PO) Take by mouth.   Yes Historical Provider, MD  Calcium Carbonate-Vitamin D (CALTRATE 600+D) 600-400 MG-UNIT per chew tablet Chew 1 tablet by mouth daily.   Yes Historical Provider, MD  loratadine (CLARITIN) 10 MG tablet Take 1 tablet (10 mg total) by mouth daily. 02/25/14  Yes Tereasa Coop, PA-C  Magnesium Oxide (MAG-OXIDE PO) Take 500 mg by mouth daily.   Yes Historical Provider, MD  metFORMIN (GLUCOPHAGE-XR) 500 MG 24 hr tablet Take 1,000 mg by mouth daily with breakfast.   Yes Historical Provider, MD  Multiple Vitamin (MULTIVITAMIN) tablet Take 1 tablet by mouth daily. Contains 700u Vitamin D   Yes Historical Provider, MD  omeprazole (PRILOSEC) 20 MG capsule Take 1 capsule (20 mg total) by  mouth daily. 01/31/14  Yes Sharion Balloon, FNP  ondansetron (ZOFRAN ODT) 4 MG disintegrating tablet Take 1 tablet (4 mg total) by mouth every 8 (eight) hours as needed for nausea or vomiting. 01/31/14  Yes Sharion Balloon, FNP  pantoprazole (PROTONIX) 40 MG tablet Take 1 tablet (40 mg total) by mouth daily. 01/28/14  Yes Colon Branch, MD  ranitidine (ZANTAC) 150 MG tablet Take 1 tablet (150 mg total) by mouth 2 (two) times daily. 02/25/14  Yes Tereasa Coop, PA-C  topiramate (TOPAMAX) 100 MG tablet Take 1 tablet (100 mg total) by mouth daily. 01/28/14  Yes Colon Branch, MD  Triamcinolone Acetonide (NASACORT ALLERGY 24HR NA) Place 2 sprays into the nose daily.   Yes Historical Provider,  MD  vitamin B-12 (CYANOCOBALAMIN) 250 MCG tablet Take 250 mcg by mouth daily.   Yes Historical Provider, MD  tobramycin-dexamethasone [facility]) ophthalmic ointment Place 1 application into the left eye 3 (three) times daily. 1 gtt os q 3 hours for 1 days then tid x 5 d 05/07/14   Janne Napoleon, NP   BP 139/78 mmHg  Pulse 75  Temp(Src) 98.3 F (36.8 C) (Oral)  Resp 16  SpO2 99% Physical Exam  Constitutional: He is oriented to person, place, and time. He appears well-developed and well-nourished. No distress.  Eyes: Conjunctivae and EOM are normal. Pupils are equal, round, and reactive to light.  There is minor erythema along the borders of the upper and lower conjunctiva. Also minor edema to the lower eyelid. There is tenderness to the upper and lower eyelids. The patient denies pain to the eye proper. The sclera is clear and without injection. Cornea clear. See note of exam with floor seen and blue light below.  Funduscopic exam. Disc sharp. No evidence of hemorrhage. No evidence of papilledema.  Pulmonary/Chest: Effort normal. No respiratory distress.  Neurological: He is alert and oriented to person, place, and time. He exhibits normal muscle tone.  Skin: Skin is warm and dry.  Psychiatric: He has a normal mood and affect.  Nursing note and vitals reviewed.   ED Course  Procedures (including critical care time) Labs Review Labs Reviewed - No data to display  Imaging Review No results found.   MDM   1. Conjunctivitis of left eye   2. Blurred vision, left eye    Spoke with the ophthalmologist on-call for Dr. Gillian Scarce. She states she is available at anytime this weekend for assistance and the patient may call her back. Advised the patient to call her tomorrow afternoon for phone follow-up and if necessary a visit depending on symptoms. Upon her device I anesthetized the eye with tetracaine applied flurosciene and examine the conjunctiva, cornea and the underside of the upper and lower  lids. No foreign bodies, no uptake to the upper lid. There is uptake to the lower lid with a "comet "," like shape. Again no foreign bodies, abrasions or abnormal uptake. Anterior chamber is clear. Sclera clear. The eye was irrigated with a bottle of eyewash. He is allergic to fluoroquinolones . Prescribed TobraDex.   Janne Napoleon, NP 05/07/14 1951  Janne Napoleon, NP 05/07/14 [phone]

## 2014-05-07 NOTE — Progress Notes (Signed)
Based on what you shared with me it looks like you have a serious condition that should be evaluated in a face to face office visit. You need an eye exam where a provider can take a very close look at all aspects of your eye with proper equipment and lighting. This is important due to the recent injury and lack of improvement. Unfortunately, the Evisit program is not ideal for situations like this and face-to-face is the only recommended way to evaluate you and treat you properly.   If you are having a true medical emergency please call 911.  If you need an urgent face to face visit, [facility] has four urgent care centers for your convenience.  [facility] a Provider at this Location  [place], [place] [address] 8 am to 8 pm Monday-Friday 9 am to 7 pm [facility] [place] a Provider at this Location  [place], [place], [place] [address] 8 am to 8 pm Monday-Friday 9 am to 6 pm Saturday 11 am to 6 pm Sunday   [facility] [place] Get Driving Directions  [address] [place], [place] [address] 8 am to 8 pm Monday-Friday 9 am to 4 pm Saturday-Sunday   [facility] (a walk in primary care provider)  [place] a Provider at this Location  [place], [place] [address] 8 am to 8:30 pm Monday-Thursday 8 am to 6 pm Friday 8 am to 4 pm Saturday-Sunday   Your e-visit answers were reviewed by a board certified advanced clinical practitioner to complete your personal care plan.  Depending on the condition, your plan could have included both over the counter or prescription medications.  You will get an e-mail in the next two days asking about your experience.  I hope that your e-visit has been  valuable and will speed your recovery . Thank you for choosing an e-visit.

## 2014-06-02 ENCOUNTER — Ambulatory Visit: Payer: 59 | Admitting: Internal Medicine

## 2014-06-18 ENCOUNTER — Encounter (HOSPITAL_COMMUNITY): Payer: Self-pay | Admitting: *Deleted

## 2014-06-18 ENCOUNTER — Emergency Department (INDEPENDENT_AMBULATORY_CARE_PROVIDER_SITE_OTHER)
Admission: EM | Admit: 2014-06-18 | Discharge: 2014-06-18 | Disposition: A | Discharge status: Home / Self Care | Payer: 59 | Source: Home / Self Care | Attending: Family Medicine | Admitting: Family Medicine

## 2014-06-18 DIAGNOSIS — H6093 Unspecified otitis externa, bilateral: Secondary | ICD-10-CM

## 2014-06-18 MED ORDER — NEOMYCIN-COLIST-HC-THONZONIUM 3.3-3-10-0.5 MG/ML OT SUSP: 3.0000 [drp] | Freq: Four times a day (QID) | OTIC | Status: DC

## 2014-06-18 NOTE — Discharge Instructions (Signed)
Thank you for coming in today. ° °Otitis Externa °Otitis externa is a bacterial or fungal infection of the outer ear canal. This is the area from the eardrum to the outside of the ear. Otitis externa is sometimes called "swimmer's ear." °CAUSES  °Possible causes of infection include: °· Swimming in dirty water. °· Moisture remaining in the ear after swimming or bathing. °· Mild injury (trauma) to the ear. °· Objects stuck in the ear (foreign body). °· Cuts or scrapes (abrasions) on the outside of the ear. °SIGNS AND SYMPTOMS  °The first symptom of infection is often itching in the ear canal. Later signs and symptoms may include swelling and redness of the ear canal, ear pain, and yellowish-white fluid (pus) coming from the ear. The ear pain may be worse when pulling on the earlobe. °DIAGNOSIS  °Your health care provider will perform a physical exam. A sample of fluid may be taken from the ear and examined for bacteria or fungi. °TREATMENT  °Antibiotic ear drops are often given for 10 to 14 days. Treatment may also include pain medicine or corticosteroids to reduce itching and swelling. °HOME CARE INSTRUCTIONS  °· Apply antibiotic ear drops to the ear canal as prescribed by your health care provider. °· Take medicines only as directed by your health care provider. °· If you have diabetes, follow any additional treatment instructions from your health care provider. °· Keep all follow-up visits as directed by your health care provider. °PREVENTION  °· Keep your ear dry. Use the corner of a towel to absorb water out of the ear canal after swimming or bathing. °· Avoid scratching or putting objects inside your ear. This can damage the ear canal or remove the protective wax that lines the canal. This makes it easier for bacteria and fungi to grow. °· Avoid swimming in lakes, polluted water, or poorly chlorinated pools. °· You may use ear drops made of rubbing alcohol and vinegar after swimming. Combine equal parts of  white vinegar and alcohol in a bottle. Put 3 or 4 drops into each ear after swimming. °SEEK MEDICAL CARE IF:  °· You have a fever. °· Your ear is still red, swollen, painful, or draining pus after 3 days. °· Your redness, swelling, or pain gets worse. °· You have a severe headache. °· You have redness, swelling, pain, or tenderness in the area behind your ear. °MAKE SURE YOU:  °· Understand these instructions. °· Will watch your condition. °· Will get help right away if you are not doing well or get worse. °Document Released: 01/28/2005 Document Revised: 06/14/2013 Document Reviewed: 02/14/2011 °[employer]® Patient Information ©2015 [employer]. This information is not intended to replace advice given to you by your health care provider. Make sure you discuss any questions you have with your health care provider. ° °

## 2014-06-18 NOTE — ED Provider Notes (Signed)
Julian Washington is a 34 y.o. male who presents to Urgent Care today for sore throat and ear pain present for about 3 days associated with cough and congestion. Patient notes that his ear pain bilaterally is consistent with previous episodes of ear infections. No fevers or chills vomiting or diarrhea. He's tried ibuprofen which has helped some.   Past Medical History  Diagnosis Date  . GERD (gastroesophageal reflux disease)   . Internal hemorrhoids     s/p banding  . Prediabetes     A1C 6.2 years ago  . Pituitary mass     h/o increased prolactin, f/u at [place] and depression     h/o suicidality w/ citalopram  . Hypogonadism male     sees urology  . Vitamin B 12 deficiency     h/o  . Migraine     on topamax  . OSA on CPAP   . Vitamin D deficiency   . Difficult intubation   . 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 ETD (eustachian tube dysfunction)     s/p ENT, declined ear tuves before    Past Surgical History  Procedure Laterality Date  . 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 Tonsillectomy and adenoidectomy     History  Substance Use Topics  . Smoking status: Never Smoker   . Smokeless tobacco: Never Used  . Alcohol Use: No   ROS as above Medications: No current facility-administered medications for this encounter.   Current Outpatient Prescriptions  Medication Sig Dispense Refill  . B Complex Vitamins (B COMPLEX-B12 PO) Take by mouth.    . Calcium Carbonate-Vitamin D (CALTRATE 600+D) 600-400 MG-UNIT per chew tablet Chew 1 tablet by mouth daily.    Marland Kitchen loratadine (CLARITIN) 10 MG tablet Take 1 tablet (10 mg total) by mouth daily. 30 tablet 11  . Magnesium Oxide (MAG-OXIDE PO) Take 500 mg by mouth daily.    . metFORMIN (GLUCOPHAGE-XR) 500 MG 24 hr tablet Take 1,000 mg by mouth daily with breakfast.    . Multiple Vitamin (MULTIVITAMIN) tablet Take 1 tablet by mouth daily. Contains 700u Vitamin D    .  neomycin-colistin-hydrocortisone-thonzonium (CORTISPORIN-TC) 3.04-13-08-0.5 MG/ML otic suspension Place 3 drops into both ears 4 (four) times daily. 10 mL 1  . omeprazole (PRILOSEC) 20 MG capsule Take 1 capsule (20 mg total) by mouth daily. 30 capsule 0  . ondansetron (ZOFRAN ODT) 4 MG disintegrating tablet Take 1 tablet (4 mg total) by mouth every 8 (eight) hours as needed for nausea or vomiting. 20 tablet 0  . pantoprazole (PROTONIX) 40 MG tablet Take 1 tablet (40 mg total) by mouth daily. 90 tablet 2  . ranitidine (ZANTAC) 150 MG tablet Take 1 tablet (150 mg total) by mouth 2 (two) times daily. 60 tablet 0  . tobramycin-dexamethasone (TOBRADEX) ophthalmic ointment Place 1 application into the left eye 3 (three) times daily. Apply 1/2 inch ribbon of ointment to the left eye every 3 hours for the first day and 4 times a day for 5 days. 3.5 g 0  . topiramate (TOPAMAX) 100 MG tablet Take 1 tablet (100 mg total) by mouth daily. 90 tablet 2  . Triamcinolone Acetonide (NASACORT ALLERGY 24HR NA) Place 2 sprays into the nose daily.    . vitamin B-12 (CYANOCOBALAMIN) 250 MCG tablet Take 250 mcg by mouth daily.     Allergies  Allergen Reactions  . Levaquin [Levofloxacin In D5w] Swelling  .  Celexa [Citalopram] Other (See Comments)    Mental status changes   . Dextrans Other (See Comments)    Makes the pt. Drowsy.   Asencion Islam [Fluticasone Propionate] Other (See Comments)    Makes nasal passageways to dry.  . Hydrocodone-Acetaminophen     REACTION: itching  . Latex   . Oxycodone Itching     Exam:  BP 110/73 mmHg  Pulse 86  Temp(Src) 98.3 F (36.8 C) (Oral)  Resp 22  SpO2 97% Gen: Well NAD HEENT: EOMI,  MMM ear canals are erythematous bilaterally. The tympanic membranes bilaterally are normal appearing. Posterior pharynx is relatively normal appearing Lungs: Normal work of breathing. CTABL Heart: RRR no MRG Abd: NABS, Soft. Nondistended, Nontender Exts: Brisk capillary refill, warm and well  perfused.   No results found for this or any previous visit (from the past 24 hour(s)). No results found.  Assessment and Plan: 34 y.o. male with otitis externa. Treat with Cortisporin drops. Return as needed.  Discussed warning signs or symptoms. Please see discharge instructions. Patient expresses understanding.     Gregor Hams, MD 06/18/14 [phone]

## 2014-06-18 NOTE — ED Notes (Signed)
Pharmacy called w questions about Rx, as the insurance will not cover the one that is written. Dr Georgina Snell discussed directly w pharmacist

## 2014-06-18 NOTE — ED Notes (Signed)
C/O sore throat & bilat earache since 5/5.  Has been taking IBU, throat spray, throat lozenges, hot tea without relief.  Felt feverish today.

## 2014-06-23 ENCOUNTER — Ambulatory Visit: Payer: 59 | Admitting: Internal Medicine

## 2014-06-28 ENCOUNTER — Telehealth: Payer: Self-pay | Admitting: Internal Medicine

## 2014-06-28 NOTE — Telephone Encounter (Signed)
Pre Visit letter sent  °

## 2014-06-30 ENCOUNTER — Other Ambulatory Visit: Payer: Self-pay

## 2014-07-06 ENCOUNTER — Ambulatory Visit (INDEPENDENT_AMBULATORY_CARE_PROVIDER_SITE_OTHER): Payer: 59 | Admitting: Internal Medicine

## 2014-07-06 ENCOUNTER — Encounter: Payer: Self-pay | Admitting: Internal Medicine

## 2014-07-06 VITALS — BP 124/68 | HR 86 | Temp 98.9°F | Resp 14 | Ht 69.75 in | Wt 367.2 lb

## 2014-07-06 DIAGNOSIS — R7309 Other abnormal glucose: Secondary | ICD-10-CM

## 2014-07-06 DIAGNOSIS — D352 Benign neoplasm of pituitary gland: Secondary | ICD-10-CM

## 2014-07-06 DIAGNOSIS — E230 Hypopituitarism: Secondary | ICD-10-CM | POA: Diagnosis not present

## 2014-07-06 DIAGNOSIS — R7303 Prediabetes: Secondary | ICD-10-CM

## 2014-07-06 NOTE — Progress Notes (Signed)
Patient ID: Julian Washington, male   DOB: [birth year], 34 y.o.   MRN: [id number]  HPI: Julian Washington is a 34 y.o.-year-old man, self-referred for management of hypogonadotropic hypogonadism, history of pituitary adenoma, s/p TSR, and prediabetes.  History of pituitary adenoma: The patient has been diagnosed with hypogonadotropic hypogonadism around 2012, after trying to achieve a pregnancy. He tried testosterone replacement and clomiphene (which did not work), a cousin who is a medical resident suggested to have further investigation to find out the cause for his hypogonadism. His urologist at that time ordered a pituitary MRI and he has been found to have a pituitary microadenoma on MRI in 12/2011. He was referred to neurosurgery - Dr Luther Hearing - and he ended up having TSR in 02/2012. Surgical pathology showed:  The sections show monomorphic nests of neoplastic cells with neuroendocrine nuclear features, consistent with a pituitary adenoma. The tumor is strongly and diffusely immunoreactive for prolactin (100% of cells). There is patchy staining for adrenocorticotropic hormone (less than 5% of cells). The cells are negative for TSH, HGH, FSH, and beta-LH.   His highest prolactin level was 44.7 before the surgery. This has decreased after the surgery.  I reviewed patient's pertinent labs per records from [place] at [place]: 11/2010: TSH 2 02/2011: Testosterone 0.89, free testosterone 2.2 (9.3-26.5) 11/17/2011: TSH 2.35, FSH 2.08, LH 1.44, testosterone 0.08 (2.41-8.27) 12/17/2011: Prolactin 44.7, estradiol 23, cortisol 2.9, hemoglobin A1c 6.2% 02/19/2012: TSH 1.9, total T3 106, free T4 1, hemoglobin A1c 5.8% 03/05/2012: Prolactin 1.4, cortisol 37.2, hemoglobin A1c 5.6%  04/02/2012: Prolactin 3.2, testosterone 79 ([phone]), hemoglobin A1c 6.1%  06/19/2012: Total testosterone 119 ([phone]), free testosterone 23.5 (40-224), SHBG 17 (10-50), LH  3.4, FSH 4.7, ACTH 20,  cortisol 6.5, TSH 3.072, free T4 0.9 09/23/2012: Total testosterone 115, free testosterone 20.5, SHBG 20 04/02/2013: Total testosterone 76, free testosterone 15.6, SHBG 15  04/26/2013: TSH 2.34, free T4 0.9  Pituitary MRI 12/28/2011: Heterogeneous signal intensity lesion centered in the anterior aspect of the sella measures approximately 1 cm in maximum dimension and demonstrates a fluid hematocrit level with some intrinsic T1 bright/T2 dark signal along its caudal margin consistent with subacute hemorrhage. There is no mass effect on the optic chiasm or evidence of cavernous sinus invasion.  Most recent pituitary MRI 09/2012: Right lateral sellar soft tissue, likely residual pituitary 0.6 x 0.4 x 0.9 cm, homogeneous postcontrast enhancement, abuts medial aspect of the right cavernous ICA. No mass effect.  He will have another MRI this year per Dr. Salomon Fick.  Hypogonadotropic hypogonadism: Diagnosed when he and his wife were trying to get pregnant.  He is set to see reproductive endocrinology in June: Dr Robbie Louis, MD.  He has been diagnosed with OSA in 2011. He is now on a CPAP machine.  He also worked night shifts, but now works days.   He started to have low T sxs 2011-2012.Please see available testosterone results above.   He was previously on Testosterone: axillar gel (did not like it as he was always fearing that he can transfer this to his wife) and inj (200 mg 2x a month). On this >> T level normalized, however he cannot do the injections himself. Clomid did not help (3 mo) >> lost capacity to ejaculate and got hot flushes when stopped.   He admits for decreased libido.  No difficulty obtaining but has pbs maintaining an erection No trauma to testes, testicular irradiation or surgery No h/o of mumps orchitis/h/o  autoimmune ds. No h/o cryptorchidism He grew and went through puberty like his peers No shrinking of testes. + small testes (<5 ml) No incomplete/delayed  sexual development     No breast discomfort/gynecomastia    No loss of body hair (axillary/pubic)/decreased need for shaving No height loss No abnormal sense of smell  No hot flushes No vision problems No worst HA of his life, but had HA No FH of hypogonadism/infertility  No FH of hemochromatosis or pituitary tumors No excessive weight gain or loss.  No chronic diseases No chronic pain. Not on opiates, does not take steroids.  No more than 2 drinks a day of alcohol at a time, and this is rarely No anabolic steroids use No herbal medicines Not on antidepressants  No AI ds in his family, no FH of MS.  He does not have family history of early cardiac disease. GF with AMI at 33 y/o.   He also prediabetes. Last Hba1c 5.9%, prev. 6.2% and 6.3% (see above) He started Metformin ER 1000 mg at night. Tolerates it well.  Diet: - Breakfast: Peanut butter sandwich, 2 eggs, yogurt and granola - Lunch: Meat and 2 veggies - Dinner: Meat and 2 veggies - Snacks: 1-2  He tried Weight Watchers >> reached a plateau.   He also has a history of B12 deficiency.  ROS: Constitutional: no weight gain/loss, + fatigue, no subjective hyperthermia/hypothermia Eyes: no blurry vision, no xerophthalmia ENT: no sore throat, no nodules palpated in throat, no dysphagia/odynophagia, no hoarseness, + decreased hearing Cardiovascular: no CP/SOB/+ palpitations/no leg swelling Respiratory: no cough/SOB Gastrointestinal: no N/V/D/C Musculoskeletal: no muscle/joint aches Skin: no rashes Neurological: no tremors/numbness/tingling/dizziness Psychiatric: + both: depression/anxiety + See history of present illness    Past Medical History  Diagnosis Date  . GERD (gastroesophageal reflux disease)   . Internal hemorrhoids     s/p banding  . Prediabetes     A1C 6.2 years ago  . Pituitary mass     h/o increased prolactin, f/u at [facility] and depression     h/o suicidality w/ citalopram  .  Hypogonadism male     sees urology  . Vitamin B 12 deficiency     h/o  . Migraine     on topamax  . OSA on CPAP   . Vitamin D deficiency   . Difficult intubation   . 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 ETD (eustachian tube dysfunction)     s/p ENT, declined ear tuves before    Past Surgical History  Procedure Laterality Date  . 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 Tonsillectomy and adenoidectomy     History   Social History  . Marital Status: Married    Spouse Name: N/A  . Number of Children: 0  . Years of Education: N/A   Occupational History  . 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 Smoking status: Never Smoker   . Smokeless tobacco: Never Used  . Alcohol Use: No  . Drug Use: No  . Sexual Activity: Not on file   Other Topics Concern  . Not on file   Social History Narrative   Lives w/ wife   Current Outpatient Prescriptions on File Prior to Visit  Medication Sig Dispense Refill  . B Complex Vitamins (B COMPLEX-B12 PO) Take by mouth.    . Calcium Carbonate-Vitamin D (CALTRATE 600+D) 600-400 MG-UNIT per chew  tablet Chew 1 tablet by mouth daily.    Marland Kitchen loratadine (CLARITIN) 10 MG tablet Take 1 tablet (10 mg total) by mouth daily. 30 tablet 11  . Magnesium Oxide (MAG-OXIDE PO) Take 500 mg by mouth daily.    . metFORMIN (GLUCOPHAGE-XR) 500 MG 24 hr tablet Take 1,000 mg by mouth daily with breakfast.    . Multiple Vitamin (MULTIVITAMIN) tablet Take 1 tablet by mouth daily. Contains 700u Vitamin D    . omeprazole (PRILOSEC) 20 MG capsule Take 1 capsule (20 mg total) by mouth daily. 30 capsule 0  . pantoprazole (PROTONIX) 40 MG tablet Take 1 tablet (40 mg total) by mouth daily. 90 tablet 2  . Pyridoxine HCl (VITAMIN B-6 PO) Take by mouth.    . ranitidine (ZANTAC) 150 MG tablet Take 1 tablet (150 mg total) by mouth 2 (two) times daily. 60 tablet 0  . topiramate (TOPAMAX) 100 MG tablet Take 1 tablet (100 mg total) by  mouth daily. 90 tablet 2  . Triamcinolone Acetonide (NASACORT ALLERGY 24HR NA) Place 2 sprays into the nose daily.    . vitamin B-12 (CYANOCOBALAMIN) 250 MCG tablet Take 250 mcg by mouth daily.    Marland Kitchen neomycin-colistin-hydrocortisone-thonzonium (CORTISPORIN-TC) 3.04-13-08-0.5 MG/ML otic suspension Place 3 drops into both ears 4 (four) times daily. (Patient not taking: Reported on 07/06/2014) 10 mL 1  . ondansetron (ZOFRAN ODT) 4 MG disintegrating tablet Take 1 tablet (4 mg total) by mouth every 8 (eight) hours as needed for nausea or vomiting. (Patient not taking: Reported on 07/06/2014) 20 tablet 0  . tobramycin-dexamethasone (TOBRADEX) ophthalmic ointment Place 1 application into the left eye 3 (three) times daily. Apply 1/2 inch ribbon of ointment to the left eye every 3 hours for the first day and 4 times a day for 5 days. (Patient not taking: Reported on 07/06/2014) 3.5 g 0   No current facility-administered medications on file prior to visit.   Allergies  Allergen Reactions  . Levaquin [Levofloxacin In D5w] Swelling  . Celexa [Citalopram] Other (See Comments)    Mental status changes   . Dextrans Other (See Comments)    Makes the pt. Drowsy.   . Hydrocodone-Acetaminophen     REACTION: itching  . Latex   . Oxycodone Itching   Family History  Problem Relation Age of Onset  . Prostate cancer Other     GF  . Diabetes Paternal Grandmother   . Hypertension Paternal Grandmother   . Diabetes Paternal Grandfather   . CAD Other     PGF  . Colon cancer Neg Hx     PE: BP 124/68 mmHg  Pulse 86  Temp(Src) 98.9 F (37.2 C) (Oral)  Resp 14  Ht 5' 9.75" (1.772 m)  Wt 367 lb 3.2 oz (166.561 kg)  BMI 53.05 kg/m2  SpO2 97% Wt Readings from Last 3 Encounters:  07/06/14 367 lb 3.2 oz (166.561 kg)  01/28/14 363 lb 8 oz (164.883 kg)  01/15/14 357 lb 9.6 oz (162.206 kg)   Constitutional: Obese, in NAD Eyes: PERRLA, EOMI, no exophthalmos ENT: moist mucous membranes, no thyromegaly, no cervical  lymphadenopathy Cardiovascular: RRR, No MRG Respiratory: CTA B Gastrointestinal: abdomen soft, NT, ND, BS+ Musculoskeletal: no deformities, strength intact in all 4 Skin: moist, warm, no rashes Neurological: no tremor with outstretched hands, DTR normal in all 4 Genital exam: normal male escutcheon, no inguinal LAD, small phallus, testes ~15 mL R and ~12 mL L mL, no testicular masses, no penile discharge.   ASSESSMENT: 1. H/o  Pituitary adenoma (prolactinoma) with pituitary apoplexy - status post TSR 02/2012  2. Hypogonadotropic hypogonadism  3. Prediabetes  4. Obesity  PLAN:  1. History of pituitary adenoma - Patient had a 1 cm pituitary adenoma, which was resected through TSR, with the staining revealing a prolactinoma - His prolactin levels remained controlled, per records reviewed - No other apparent hormone deficiencies after his surgery, except for [facility] and FSH - MRI follow-up is performed by Dr. Salomon Fick (neurosurgery) - I reviewed the previous MRI reports along with the patient. - I would like to recheck his pituitary hormones at 8 AM, fasting. Patient will return for this.  2. Hypogonadotropic hypogonadism  - Patient has had a long history of hypogonadotropic hypogonadism, with fairly poor response to treatment based on the available testosterone levels - I am not completely convinced that his hypogonadism is related to his pituitary tumor, since his prolactin level was only 44.7, which is elevated, but not enough to suppress the testosterone to castrate levels.  - I will repeat his testosterone level along with the LH, [place], but will also add that Beta hCG, IBC panel to look for other etiologies for his very low testosterone level. He has a small penis and also small testicles, which is reminiscent of Klinefelter's syndrome, however, his LH and FSH levels were not high. He does not offer a history of anosmia to suggest Kallman syndrome, but this is a possibility. His lack of  response to clomiphene would oppose a diagnosis of obesity-induced hypogonadism. He also has obstructive sleep apnea, but he is compliant with his CPAP now. He has prediabetes, which is mild. He has a low HDL, but his triglycerides are not elevated. He used to work night shift, but is now working days. Overall, I believe that we do not have a clear reason for his severe hypogonadism. We'll try to perform further investigation for this. If the investigation is negative,  he may need more specialized testing, which I would leave at the latitude of his reproductive endocrinologist. - We discussed about treatment of his hypogonadism, which consists of testosterone replacement. In the setting of pregnancy attempts, testosterone is not the best treatment, and he is aware of this. He will have an appointment with reproductive endocrinology and I discussed with the patient that they will treat him for his hypogonadism for the period of time for which she desires fertility. Afterwards, I can take over and we can start testosterone replacement with the formulation of his choice.  3. Prediabetes - This appears well controlled, with the last hemoglobin A1c lower than 6 - We will repeat this today - Continue metformin ER 1000 mg at night - Weight loss will greatly help  4. Obesity  - We discussed about ways to improve his weight, and I suggested that he joins the [employer] weight loss program that opened hearing [place]. I explained detailed about the program and he agrees to try this.  Will check the following tests: Orders Placed This Encounter  Procedures  . Cortisol  . ACTH  . Insulin-like growth factor  . Prolactin  . FSH/LH  . Estradiol  . Testosterone, Free, Total, SHBG  . hCG, quantitative, pregnancy  . IBC panel  . HgB A1c  . TSH  . T4, free  . CBC  . PSA  . T3, free   - time spent with the patient: 1 hour, of which >50% was spent in obtaining information about his symptoms,  reviewing his previous labs, evaluations, and  treatments, counseling him about his condition (please see the discussed topics above), and developing a plan to further investigate it; he had a number of questions which I addressed.  Component     Latest Ref Rng 07/08/2014  WBC     4.0 - 10.5 K/uL 7.8  RBC     4.22 - 5.81 Mil/uL 4.68  Hemoglobin     13.0 - 17.0 g/dL 12.6 (L)  HCT     39.0 - 52.0 % 38.0 (L)  MCV     78.0 - 100.0 fl 81.2  MCHC     30.0 - 36.0 g/dL 33.1  RDW     11.5 - 15.5 % 14.3  Platelets     150.0 - 400.0 K/uL 186.0  Testosterone     300 - 890 ng/dL 106 (L)  Sex Hormone Binding     10 - 50 nmol/L 17  Testosterone Free     47.0 - 244.0 pg/mL 27.2 (L)  Testosterone-% Free     1.6 - 2.9 % 2.6  Iron     42 - 165 ug/dL 44  Transferrin     212.0 - 360.0 mg/dL 227.0  Saturation Ratios     20.0 - 50.0 % 13.8 (L)  IGF-I, LC/MS     53 - 331 ng/mL 59  Z-Score (Male)     -2.0-+2.0 SD -1.8  FSH     1.4 - 18.1 mIU/mL 4.1  LH     1.5 - 9.3 mIU/mL 3.1  Cortisol, Plasma      5.1  C206 ACTH     6 - 50 pg/mL 21  Prolactin     2.1 - 17.1 ng/mL 2.4  Estradiol      28.3  Quantitative HCG      0.21  Hemoglobin A1C     4.6 - 6.5 % 5.9  TSH     0.35 - 4.50 uIU/mL 2.71  Free T4     0.60 - 1.60 ng/dL 0.76  PSA     0.10 - 4.00 ng/mL 0.15  T3, Free     2.3 - 4.2 pg/mL 3.3   Message sent to patient: Dear Mr. Bero, All the labs are now back: - You have hypogonadotropic hypogonadism, with inappropriately normal FSH and LH in the setting of a low testosterone.  - You also have anemia, which is chronic, and may be due to low testosterone.  - No signs of hemochromatosis.  - No signs of adrenal insufficiency. - No hyperestrogenism (which is probably why Clomid did not work for you in the past) - Normal beta hCG (no signs of testicular tumors) - Normal PSA - Normal thyroid tests - hemoglobin A1c in the low prediabetic range. I will forward these labs to your  reproductive endocrinologist, along with my note. Sincerely, Philemon Kingdom MD  We'll forward the results to his reproductive endocrinologist, per message from patient: Hi Dr. Cruzita Lederer,   Once all appropriate labs are in please also forward them to Dr. Francesca OmanRoger Shelter, MD at [facility]. She is the doctor that will be performing the IVF cycles.   Thanks,   Julian Washington. Sherren Mocha

## 2014-07-06 NOTE — Patient Instructions (Signed)
Please come back for labs between 8 and 9 am, fasting.  Please return in 1 year.

## 2014-07-07 ENCOUNTER — Telehealth: Payer: Self-pay | Admitting: Internal Medicine

## 2014-07-07 ENCOUNTER — Other Ambulatory Visit: Payer: 59

## 2014-07-07 NOTE — Telephone Encounter (Signed)
Returned pt's call. Pt stated that the ins co wants to know why Dr Cruzita Lederer is doing this testing to determine if they will cover this. The test is an $800 test. Please read message below. Thank you.

## 2014-07-07 NOTE — Telephone Encounter (Signed)
Patient stated that [facility] said the cpt codes for The genetic testing were 73532, [phone] contact care management [phone]

## 2014-07-07 NOTE — Telephone Encounter (Signed)
Julian Washington, Please tell him that looking back in the labs available, he actually had labs from before the surgery and LH and FSH were not high. This makes Klinefelter sd. less likely. With this high price tag, I would actually suggest that we do not do the test now, but will see if the reproductive endocrinologist thinks it is still necessary.

## 2014-07-07 NOTE — Telephone Encounter (Signed)
Called pt and advised him per Dr Arman Filter message below. Pt voiced understanding and will come tomorrow for labs.

## 2014-07-08 ENCOUNTER — Other Ambulatory Visit (INDEPENDENT_AMBULATORY_CARE_PROVIDER_SITE_OTHER): Payer: 59

## 2014-07-08 DIAGNOSIS — E230 Hypopituitarism: Secondary | ICD-10-CM

## 2014-07-08 DIAGNOSIS — R7309 Other abnormal glucose: Secondary | ICD-10-CM

## 2014-07-08 DIAGNOSIS — D352 Benign neoplasm of pituitary gland: Secondary | ICD-10-CM | POA: Diagnosis not present

## 2014-07-08 DIAGNOSIS — R7303 Prediabetes: Secondary | ICD-10-CM

## 2014-07-08 LAB — CBC
HCT: 38 % — ABNORMAL LOW (ref 39.0–52.0)
HEMOGLOBIN: 12.6 g/dL — AB (ref 13.0–17.0)
MCHC: 33.1 g/dL (ref 30.0–36.0)
MCV: 81.2 fl (ref 78.0–100.0)
PLATELETS: 186 10*3/uL (ref 150.0–400.0)
RBC: 4.68 Mil/uL (ref 4.22–5.81)
RDW: 14.3 % (ref 11.5–15.5)
WBC: 7.8 10*3/uL (ref 4.0–10.5)

## 2014-07-08 LAB — HCG, QUANTITATIVE, PREGNANCY: Quantitative HCG: 0.21 m[IU]/mL

## 2014-07-08 LAB — IBC PANEL
Iron: 44 ug/dL (ref 42–165)
Saturation Ratios: 13.8 % — ABNORMAL LOW (ref 20.0–50.0)
Transferrin: 227 mg/dL (ref 212.0–360.0)

## 2014-07-08 LAB — PSA: PSA: 0.15 ng/mL (ref 0.10–4.00)

## 2014-07-08 LAB — T4, FREE: FREE T4: 0.76 ng/dL (ref 0.60–1.60)

## 2014-07-08 LAB — CORTISOL: Cortisol, Plasma: 5.1 ug/dL

## 2014-07-08 LAB — HEMOGLOBIN A1C: Hgb A1c MFr Bld: 5.9 % (ref 4.6–6.5)

## 2014-07-08 LAB — TSH: TSH: 2.71 u[IU]/mL (ref 0.35–4.50)

## 2014-07-08 LAB — T3, FREE: T3, Free: 3.3 pg/mL (ref 2.3–4.2)

## 2014-07-09 ENCOUNTER — Encounter: Payer: Self-pay | Admitting: Internal Medicine

## 2014-07-09 LAB — ESTRADIOL: ESTRADIOL: 28.3 pg/mL

## 2014-07-09 LAB — PROLACTIN: Prolactin: 2.4 ng/mL (ref 2.1–17.1)

## 2014-07-09 LAB — FSH/LH
FSH: 4.1 m[IU]/mL (ref 1.4–18.1)
LH: 3.1 m[IU]/mL (ref 1.5–9.3)

## 2014-07-12 LAB — TESTOSTERONE, FREE, TOTAL, SHBG
Sex Hormone Binding: 17 nmol/L (ref 10–50)
TESTOSTERONE FREE: 27.2 pg/mL — AB (ref 47.0–244.0)
TESTOSTERONE-% FREE: 2.6 % (ref 1.6–2.9)
Testosterone: 106 ng/dL — ABNORMAL LOW (ref 300–890)

## 2014-07-12 LAB — INSULIN-LIKE GROWTH FACTOR
IGF-I, LC/MS: 59 ng/mL (ref 53–331)
Z-SCORE (MALE): -1.8 {STDV} (ref ?–2.0)

## 2014-07-12 LAB — ACTH: C206 ACTH: 21 pg/mL (ref 6–50)

## 2014-07-19 ENCOUNTER — Telehealth: Payer: Self-pay | Admitting: *Deleted

## 2014-07-19 NOTE — Telephone Encounter (Signed)
Unable to reach patient at time of Pre-Visit Call.  Left message for patient to return call when available.    

## 2014-07-20 ENCOUNTER — Encounter: Payer: Self-pay | Admitting: Internal Medicine

## 2014-07-20 ENCOUNTER — Ambulatory Visit (INDEPENDENT_AMBULATORY_CARE_PROVIDER_SITE_OTHER): Payer: 59 | Admitting: Internal Medicine

## 2014-07-20 VITALS — BP 124/72 | HR 70 | Temp 97.9°F | Ht 70.0 in | Wt 368.5 lb

## 2014-07-20 DIAGNOSIS — Z0000 Encounter for general adult medical examination without abnormal findings: Secondary | ICD-10-CM | POA: Diagnosis not present

## 2014-07-20 LAB — LIPID PANEL
Cholesterol: 117 mg/dL (ref 0–200)
HDL: 38.2 mg/dL — ABNORMAL LOW (ref >39.00)
LDL Cholesterol: 64 mg/dL (ref 0–99)
NONHDL: 78.8
Total CHOL/HDL Ratio: 3
Triglycerides: 75 mg/dL (ref 0.0–149.0)
VLDL: 15 mg/dL (ref 0.0–40.0)

## 2014-07-20 LAB — COMPREHENSIVE METABOLIC PANEL
ALK PHOS: 74 U/L (ref 39–117)
ALT: 25 U/L (ref 0–53)
AST: 19 U/L (ref 0–37)
Albumin: 4.2 g/dL (ref 3.5–5.2)
BUN: 18 mg/dL (ref 6–23)
CO2: 25 mEq/L (ref 19–32)
CREATININE: 0.78 mg/dL (ref 0.40–1.50)
Calcium: 9.2 mg/dL (ref 8.4–10.5)
Chloride: 107 mEq/L (ref 96–112)
GFR: 121.61 mL/min (ref >60.00)
Glucose, Bld: 97 mg/dL (ref 70–99)
Potassium: 4.2 mEq/L (ref 3.5–5.1)
SODIUM: 137 meq/L (ref 135–145)
Total Bilirubin: 0.4 mg/dL (ref 0.2–1.2)
Total Protein: 6.9 g/dL (ref 6.0–8.3)

## 2014-07-20 LAB — MAGNESIUM: Magnesium: 2.1 mg/dL (ref 1.5–2.5)

## 2014-07-20 LAB — VITAMIN B12: VITAMIN B 12: 1041 pg/mL — AB (ref 211–911)

## 2014-07-20 NOTE — Progress Notes (Signed)
Pre visit review using our clinic review tool, if applicable. No additional management support is needed unless otherwise documented below in the visit note. 

## 2014-07-20 NOTE — Progress Notes (Signed)
Subjective:    Patient ID: Julian Washington, male    DOB: 1980-12-08, 34 y.o.   MRN: [id number]  DOS:  07/20/2014 Type of visit - description : cpx Interval history: In general doing well except for palpitations, see below   Review of Systems  /Constitutional: No fever. No chills. No unexplained wt changes. No unusual sweats  HEENT: No dental problems, no ear discharge, no facial swelling, no voice changes. No eye discharge, no eye  redness , no  intolerance to light   Respiratory: No wheezing , no  difficulty breathing. No cough , no mucus production  Cardiovascular: No CP, no leg swelling . Having on and off palpitations, usually associated with stress, the last a couple of minutes, he felt his own pulse and it is regular and not fast. No associated symptoms such as chest pain, sweats or near syncope.    GI: no nausea, no vomiting, no diarrhea , no  abdominal pain.  No blood in the stools. No dysphagia, no odynophagia    Endocrine: No polyphagia, no polyuria , no polydipsia  GU: No dysuria, gross hematuria, difficulty urinating. No urinary urgency, no frequency.  Musculoskeletal: No joint swellings or unusual aches or pains  Skin: No change in the color of the skin, palor , no  Rash  Allergic, immunologic: No environmental allergies , no  food allergies  Neurological: No dizziness no  syncope. No headaches. No diplopia, no slurred, no slurred speech, no motor deficits, no facial  Numbness  Hematological: No enlarged lymph nodes, no easy bruising , no unusual bleedings  Psychiatry: No suicidal ideas, no hallucinations, no beavior problems, no confusion.   + Stress, work related but also family related, having some marital issues due to infertility, undergoing counseling   Past Medical History  Diagnosis Date  . GERD (gastroesophageal reflux disease)   . Internal hemorrhoids     s/p banding  . Prediabetes     A1C 6.2 years ago  . Pituitary mass     h/o increased  prolactin, Dr Cruzita Lederer (previously @ [facility])  . Anxiety and depression     h/o suicidality w/ citalopram  . Hypogonadism male     Dr Cruzita Lederer, used to see urology  . Vitamin B 12 deficiency     h/o  . Migraine     on topamax  . OSA on CPAP   . Vitamin D deficiency   . Difficult intubation   . 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 ETD (eustachian tube dysfunction)     s/p ENT, declined ear tuves before     Past Surgical History  Procedure Laterality Date  . 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 Tonsillectomy and adenoidectomy      History   Social History  . Marital Status: Married    Spouse Name: N/A  . Number of Children: 0  . Years of Education: N/A   Occupational History  . 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 Smoking status: Never Smoker   . Smokeless tobacco: Never Used  . Alcohol Use: No  . Drug Use: No  . Sexual Activity: Not on file   Other Topics Concern  . Not on file   Social History Narrative   Lives w/ wife     Family History  Problem Relation Age of Onset  . Prostate cancer Other     GF  .  Diabetes Paternal Grandmother   . Hypertension Paternal Grandmother   . Diabetes Paternal Grandfather   . CAD Other     PGF  . Colon cancer Neg Hx        Medication List       This list is accurate as of: 07/20/14 11:59 PM.  Always use your most recent med list.               B COMPLEX-B12 PO  Take by mouth.     CALTRATE 600+D 600-400 MG-UNIT per chew tablet  Generic drug:  Calcium Carbonate-Vitamin D  Chew 1 tablet by mouth daily. None Chew     fluticasone 50 MCG/ACT nasal spray  Commonly known as:  FLONASE  Place 2 sprays into both nostrils daily.     loratadine 10 MG tablet  Commonly known as:  CLARITIN  Take 1 tablet (10 mg total) by mouth daily.     MAG-OXIDE PO  Take 500 mg by mouth daily.     metFORMIN 500 MG 24 hr tablet  Commonly known as:  GLUCOPHAGE-XR  Take 1,000  mg by mouth daily with breakfast.     multivitamin tablet  Take 1 tablet by mouth daily.     NASACORT ALLERGY 24HR NA  Place 2 sprays into the nose daily.     pantoprazole 40 MG tablet  Commonly known as:  PROTONIX  Take 1 tablet (40 mg total) by mouth daily.     tobramycin-dexamethasone ophthalmic ointment  Commonly known as:  TOBRADEX  Place 1 application into the left eye 3 (three) times daily. Apply 1/2 inch ribbon of ointment to the left eye every 3 hours for the first day and 4 times a day for 5 days.     topiramate 100 MG tablet  Commonly known as:  TOPAMAX  Take 1 tablet (100 mg total) by mouth daily.     vitamin B-12 250 MCG tablet  Commonly known as:  CYANOCOBALAMIN  Take 250 mcg by mouth daily.     VITAMIN B-6 PO  Take by mouth.           Objective:   Physical Exam BP 124/72 mmHg  Pulse 70  Temp(Src) 97.9 F (36.6 C) (Oral)  Ht 5\' 10"  (1.778 m)  Wt 368 lb 8 oz (167.151 kg)  BMI 52.87 kg/m2  SpO2 97% General:   Well developed, well nourished . NAD.  HEENT:  Normocephalic . Face symmetric, atraumatic Lungs:  CTA B Normal respiratory effort, no intercostal retractions, no accessory muscle use. Heart: RRR,  no murmur.  no pretibial edema bilaterally  Abdomen:  Not distended, soft, non-tender. No rebound or rigidity. No mass,organomegaly Skin: Not pale. Not jaundice Neurologic:  alert & oriented X3.  Speech normal, gait appropriate for age and unassisted Psych--  Cognition and judgment appear intact.  Cooperative with normal attention span and concentration.  Behavior appropriate. No anxious or depressed appearing.      Assessment & Plan:

## 2014-07-20 NOTE — Assessment & Plan Note (Addendum)
Tdap 2015 Never had a cscope  Labs  Reviewed Trying to improve his diet, planning to eat healthier and do calorie counting. Trying to be more active Labs reviewed, will check few labs, see orders  Other issues. History of pituitary adenoma follow-up by endocrinology History of hypogonadism, infertility: Recently his sperm was checked and it was okay, they will attempt in vitro fertilization, would like to proceed w/ HRT after his wife gets pregnant. They cancel the appointment to see the infertility specialist for him. OSA: Good compliance with CPAP Palpitations, EKG showed sinus rhythm without acute changes,  Sx associated to stress. Patient is counseled. History of B12 deficiency, will check labs Prediabetes, on metformin,  last A1c was satisfactory

## 2014-07-20 NOTE — Patient Instructions (Signed)
Please go to the lab  If the palpitations increase in frequency, duration or they are associated with other symptoms such as chest pain: Please let me know

## 2014-07-26 ENCOUNTER — Ambulatory Visit: Payer: Self-pay | Admitting: Pharmacist

## 2014-07-27 ENCOUNTER — Ambulatory Visit: Payer: 59 | Admitting: Pharmacist

## 2014-07-28 ENCOUNTER — Ambulatory Visit (INDEPENDENT_AMBULATORY_CARE_PROVIDER_SITE_OTHER): Payer: Self-pay | Admitting: Family Medicine

## 2014-07-28 VITALS — BP 144/88 | HR 87

## 2014-07-28 DIAGNOSIS — R7303 Prediabetes: Secondary | ICD-10-CM

## 2014-07-28 DIAGNOSIS — R7309 Other abnormal glucose: Secondary | ICD-10-CM

## 2014-07-28 NOTE — Progress Notes (Signed)
Subjective:  Patient presents today for initial visit for prediabetes as part of the employer-sponsored Link to Wellness program.  Current diabetes regimen includes metformin ER 1,000 mg daily. Patient does not take an aspirin or a statin and has previous history with (but no current history of) ACEI. Most recent MD follow up was 07/20/14 with Dr. Larose Kells. No significant med changes or major health changes at this time.    Assessment/Plan:  Patient is a 34 y.o. male with prediabetes. Most recent a1c was 5.9%. Weight was not measurable on my scale, but recently weighed 368 lbs at Dr. Larose Kells' office.   Lifestyle improvements:  Physical Activity-  Fairly sedentary life which was previously more active when on Testosterone.  Nutrition-  Recently stopped attempts at Weight Watchers after frustration with new methodology offered. "You can't expect a fat person to go from eating complete junk to being perfect overnight."   Goals:  1. Set up appointment with Dietician. 2. Talk with Dr. Cruzita Lederer about other pressing matters (non diabetic matters).

## 2014-08-02 NOTE — Progress Notes (Signed)
ATTENDING PHYSICIAN NOTE:Link to wellness Program I have reviewed the chart and agree with the plan as detailed above. Dorcas Mcmurray MD Pager [phone]

## 2014-08-02 NOTE — Progress Notes (Signed)
ATTENDING PHYSICIAN NOTE: Link to [employer] I have reviewed the chart and agree with the plan as detailed above. Dorcas Mcmurray MD Pager [phone]

## 2014-09-16 NOTE — Patient Outreach (Signed)
[facility]) Care Management  09/16/2014  Julian Washington 31-Aug-1980 [id number]   Member contacted [place] to request a benefit exception to have a MRI at [facility] as he has been unable to tolerate the open MRIs that are used in the [place] system.  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  He has panic attacks when his goes into the machine and he is not advised to take sediatives before the tests due to severe obstructive sleep apnea and smaller airways.   [place] radiology to discover that the open MRI they use is the same machine that member was unable to tolerate in the past. Plan to submit request that member be covered to have MRI at the [facility] rate. Peter Garter RN, [facility] Care Management Coordinator-Link to [place] Management [phone]

## 2014-09-20 ENCOUNTER — Other Ambulatory Visit: Payer: Self-pay

## 2014-09-20 NOTE — Patient Outreach (Signed)
[facility]) Care Management  09/20/2014  ALDEAN PIPE 16-Dec-1980 [id number]  Contacted member via e-mail that his benefit exception for MRI at [facility] has been approved. Instructed to contact RNCM for any further problems or concerns.   Plan to follow up with member for Link to Wellness visit in October. Peter Garter RN, [facility] Care Management Coordinator-Link to [place] Management [phone]

## 2014-09-23 ENCOUNTER — Encounter: Payer: Self-pay | Admitting: Internal Medicine

## 2014-09-28 ENCOUNTER — Telehealth: Payer: Self-pay | Admitting: Internal Medicine

## 2014-09-28 NOTE — Telephone Encounter (Signed)
Called pt and lvm advising him per Dr Arman Filter message below. Advised pt to call back with any questions.

## 2014-09-28 NOTE — Telephone Encounter (Signed)
Please review message below and advise.  

## 2014-09-28 NOTE — Telephone Encounter (Signed)
So sorry he did not get the message!!!! I answered later that day (09/23/14) - I found the message in my Outbasket but it does not appear in MyChart:  Dear Mr Mccamish,     That is great!    I do not usually prescribe these medicines. One option is to discuss with PCP or to start the [facility] Weight loss program that recently opened in [place]:    [email or web address]     Sincerely,    Philemon Kingdom MD

## 2014-09-28 NOTE — Telephone Encounter (Signed)
Patient called stating that he had sent Dr. Cruzita Lederer a MyChart message and would like to know what her decision was   Please advise    Thank you

## 2014-09-30 ENCOUNTER — Encounter: Payer: Self-pay | Admitting: Internal Medicine

## 2014-10-07 ENCOUNTER — Encounter: Payer: 59 | Attending: Internal Medicine | Admitting: Skilled Nursing Facility1

## 2014-10-07 ENCOUNTER — Encounter: Payer: Self-pay | Admitting: Skilled Nursing Facility1

## 2014-10-07 VITALS — Ht 71.0 in | Wt 377.0 lb

## 2014-10-07 DIAGNOSIS — Z713 Dietary counseling and surveillance: Secondary | ICD-10-CM | POA: Insufficient documentation

## 2014-10-07 DIAGNOSIS — R7303 Prediabetes: Secondary | ICD-10-CM

## 2014-10-07 DIAGNOSIS — R7309 Other abnormal glucose: Secondary | ICD-10-CM | POA: Insufficient documentation

## 2014-10-07 NOTE — Progress Notes (Signed)
  Medical Nutrition Therapy:  Appt start time: 0800 end time:  0900.   Assessment:  Primary concerns today: referred for prediabetes. Pt states he has weight issues and is prediabetic. Diet hx: weight watchers 10-15 pounds lost but then the program was changed which made it more difficult (didn't allow for the foods he enjoyed). Pt states he went to [facility] for weight management but has not started any programs. Pt states he and his wife are trying to get pregnant, pt also states he has low testosterone. Pt states his sleep is good with 6-7 hours. Pt states he works 3 days a week-3 12's.   Preferred Learning Style:   No preference indicated   Learning Readiness:   Contemplating  MEDICATIONS: Metformin   DIETARY INTAKE:  Usual eating pattern includes 3 meals and 2 snacks per day.  Everyday foods include none stated.  Avoided foods include none stated.    24-hr recall:  B ( AM): sandwhich-peanutbutter and honey------2-3 eggs, toast, oatmeal  Snk ( AM): [place] bacon L ( PM): 2 boiled eggs with greek yogurt with granola Snk ( PM): pizza or meat and 2 vegetables D ( PM): left overs-meat and 2 vegetables Snk ( PM): cheese-----portion of leftover-----cereal Beverages: diet soda, coffee, tea with stevia, skim milk  Usual physical activity: ADL's  Estimated energy needs: 2000 calories 225 g carbohydrates 150 g protein 56 g fat  Progress Towards Goal(s):  In progress.   Nutritional Diagnosis:  NB-1.1 Food and nutrition-related knowledge deficit As related to no prior nutrition education from a nutrition professional.  As evidenced by pt report, 24 hr recall, BMI 52, A1C 5.9.    Intervention:  Nutrition counseling for prediabetes. Dietitian educated the pt on hunger/fullness cues, balanced/varied diet, and the importance of physical; activity.  Goals: -Motivators: get healthier, children, living a long healthy life, increased energy, shop a regular store -Barriers to  change: Time/Work schedule, appetite cravings/lack of satisfaction, and ponder other possible barriers -Plan your meals for the week on your days off -Honor your body and your hunger/fullness cues -Identify why you are eating -Activities for stress relief and to do instead of eating when you are not hungry: read, building models, and maybe backyard bowling -3 meals a day and 2-3 snacks using the MyPlate method -Increase your water consumption  Teaching Method Utilized:  Visual Auditory Handouts given during visit include:  Snack sheet  MyPlate  Low sodium seasoning options  Barriers to learning/adherence to lifestyle change: Time  Demonstrated degree of understanding via:  Teach Back   Monitoring/Evaluation:  Dietary intake, exercise, A1C, and body weight prn.

## 2014-10-07 NOTE — Patient Instructions (Signed)
-  Motivators: get healthier, children, living a long healthy life, increased energy, shop a regular store -Barriers to change: Time/Work schedule, appetite cravings/lack of satisfaction, and ponder other possible barriers -Plan your meals for the week on your days off -Honor your body and your hunger/fullness cues -Identify why you are eating -Activities for stress relief and to do instead of eating when you are not hungry: read, building models, and maybe backyard bowling -3 meals a day and 2-3 snacks using the MyPlate method -Increase your water consumption

## 2014-11-14 ENCOUNTER — Ambulatory Visit (INDEPENDENT_AMBULATORY_CARE_PROVIDER_SITE_OTHER): Payer: 59 | Admitting: Internal Medicine

## 2014-11-14 ENCOUNTER — Encounter: Payer: Self-pay | Admitting: Internal Medicine

## 2014-11-14 VITALS — BP 128/76 | HR 84 | Temp 98.9°F | Ht 70.0 in | Wt 385.0 lb

## 2014-11-14 DIAGNOSIS — Z09 Encounter for follow-up examination after completed treatment for conditions other than malignant neoplasm: Secondary | ICD-10-CM

## 2014-11-14 DIAGNOSIS — B349 Viral infection, unspecified: Secondary | ICD-10-CM | POA: Diagnosis not present

## 2014-11-14 DIAGNOSIS — J029 Acute pharyngitis, unspecified: Secondary | ICD-10-CM

## 2014-11-14 DIAGNOSIS — R509 Fever, unspecified: Secondary | ICD-10-CM

## 2014-11-14 LAB — POCT INFLUENZA A/B
INFLUENZA A, POC: NEGATIVE
INFLUENZA B, POC: NEGATIVE

## 2014-11-14 LAB — POCT RAPID STREP A (OFFICE): Rapid Strep A Screen: NEGATIVE

## 2014-11-14 MED ORDER — METFORMIN HCL ER 500 MG PO TB24: 1000.0000 mg | ORAL_TABLET | Freq: Every day | ORAL | Status: DC

## 2014-11-14 NOTE — Progress Notes (Signed)
Pre visit review using our clinic review tool, if applicable. No additional management support is needed unless otherwise documented below in the visit note. 

## 2014-11-14 NOTE — Progress Notes (Addendum)
Subjective:    Patient ID: Julian Washington, male    DOB: [birth year], 34 y.o.   MRN: [id number]  DOS:  11/14/2014 Type of visit - description : Acute visit Interval history: Symptoms started 3 days ago with sore throat, sinus congestion, ear ache, headache mostly at the posterior area. Today for the first time he had a temperature 99.3 and he was asked to be seen by PCP (he is a Marine scientist, works at the hospital).   Review of Systems No nausea or vomiting No myalgias No actual cough or chest congestion. When asked about a rash, he did have a rash on the lower extremities a week ago after walking all day long when he was visiting [place]. Rash self resolved.  No other skin abnormalities  Past Medical History  Diagnosis Date  . GERD (gastroesophageal reflux disease)   . Internal hemorrhoids     s/p banding  . Prediabetes     A1C 6.2 years ago  . 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[place])     h/o increased prolactin, Dr Cruzita Lederer (previously @ [facility])  . Anxiety and depression     h/o suicidality w/ citalopram  . Hypogonadism male     Dr Cruzita Lederer, used to see urology  . Vitamin B 12 deficiency     h/o  . Migraine     on topamax  . OSA on CPAP   . Vitamin D deficiency   . Difficult intubation   . 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 ETD (eustachian tube dysfunction)     s/p ENT, declined ear tuves before   . History of chicken pox     Titered on 08/16/2005    Past Surgical History  Procedure Laterality Date  . 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 Tonsillectomy and adenoidectomy      Social History   Social History  . Marital Status: Married    Spouse Name: N/A  . Number of Children: 0  . Years of Education: N/A   Occupational History  . 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 Smoking status: Never Smoker   . Smokeless tobacco: Never Used  . Alcohol Use: No  . Drug Use: No  . Sexual Activity: Not on file   Other Topics Concern   . Not on file   Social History Narrative   Lives w/ wife        Medication List       This list is accurate as of: 11/14/14 11:59 PM.  Always use your most recent med list.               AFRIN ALLERGY NA  Place into the nose as needed.     B COMPLEX-B12 PO  Take by mouth.     CALTRATE 600+D 600-400 MG-UNIT chew tablet  Generic drug:  Calcium Carbonate-Vitamin D  Chew 1 tablet by mouth daily. None Chew     fexofenadine 180 MG tablet  Commonly known as:  ALLEGRA  Take 180 mg by mouth daily.     MAG-OXIDE PO  Take 500 mg by mouth daily.     metFORMIN 500 MG 24 hr tablet  Commonly known as:  GLUCOPHAGE-XR  Take 2 tablets (1,000 mg total) by mouth daily with breakfast.     multivitamin tablet  Take 1 tablet by mouth daily.     NASACORT ALLERGY 24HR NA  Place  2 sprays into the nose daily.     pantoprazole 40 MG tablet  Commonly known as:  PROTONIX  Take 1 tablet (40 mg total) by mouth daily.     topiramate 100 MG tablet  Commonly known as:  TOPAMAX  Take 1 tablet (100 mg total) by mouth daily.     vitamin B-12 250 MCG tablet  Commonly known as:  CYANOCOBALAMIN  Take 250 mcg by mouth daily.     VITAMIN B-6 PO  Take by mouth.           Objective:   Physical Exam BP 128/76 mmHg  Pulse 84  Temp(Src) 98.9 F (37.2 C) (Oral)  Ht 5\' 10"  (1.778 m)  Wt 385 lb (174.635 kg)  BMI 55.24 kg/m2  SpO2 96% General:   Well developed, well nourished, no acutely ill or toxic appearing  HEENT:  Normocephalic . Face symmetric, atraumatic. TMs normal, nose congested, sinuses no TTP. Throat symmetric, tonsils absent, no red, no discharge. Lungs:  CTA B Normal respiratory effort, no intercostal retractions, no accessory muscle use. Heart: RRR,  no murmur.  No pretibial edema bilaterally  Skin: Not pale. Not jaundice Neurologic:  alert & oriented X3.  Speech normal, gait appropriate for age and unassisted Psych--  Cognition and judgment appear intact.    Cooperative with normal attention span and concentration.  Behavior appropriate. No anxious or depressed appearing.      Assessment & Plan:   Assessment > Prediabetes Anxiety depression (suicidality w/  Citalopram) Vitamin D and  B12 deficiency Endocrinology:Dr Gherghe --Pituitary mass --h/o increased prolactin --Hypogonadism Migraines, on Topamax OSA on CPAP Difficulty intubation ET  dysfunction, declined ear tubes Hemorrhoids s/p  banding  Plan  Viral syndrome Rapid strep test and flu test negative today. Likely has a viral syndrome, recommend conservative treatment, see instructions. 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

## 2014-11-14 NOTE — Patient Instructions (Signed)
Rest, fluids , tylenol  For cough: Take Mucinex DM twice a day as needed until better  For nasal congestion Use OTC Nasocort  Okay to use Afrin as needed for a few days  Call if not gradually better over the next  5-6 days  Call anytime if the symptoms are severe

## 2014-11-15 DIAGNOSIS — Z09 Encounter for follow-up examination after completed treatment for conditions other than malignant neoplasm: Secondary | ICD-10-CM | POA: Insufficient documentation

## 2014-11-15 NOTE — Addendum Note (Signed)
Addended by: Kathlene November E on: 11/15/2014 09:31 AM   Modules accepted: Miquel Dunn

## 2014-11-15 NOTE — Assessment & Plan Note (Signed)
Viral syndrome Rapid strep test and flu test negative today. Likely has a viral syndrome, recommend conservative treatment, see instructions. 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

## 2014-11-23 ENCOUNTER — Ambulatory Visit (INDEPENDENT_AMBULATORY_CARE_PROVIDER_SITE_OTHER): Payer: 59 | Admitting: Pulmonary Disease

## 2014-11-23 ENCOUNTER — Encounter: Payer: Self-pay | Admitting: Pulmonary Disease

## 2014-11-23 ENCOUNTER — Encounter: Payer: Self-pay | Admitting: *Deleted

## 2014-11-23 VITALS — BP 122/80 | HR 79 | Temp 98.8°F | Ht 71.0 in | Wt 382.4 lb

## 2014-11-23 DIAGNOSIS — R0981 Nasal congestion: Secondary | ICD-10-CM

## 2014-11-23 DIAGNOSIS — H938X1 Other specified disorders of right ear: Secondary | ICD-10-CM

## 2014-11-23 DIAGNOSIS — Z23 Encounter for immunization: Secondary | ICD-10-CM

## 2014-11-23 DIAGNOSIS — G4733 Obstructive sleep apnea (adult) (pediatric): Secondary | ICD-10-CM | POA: Diagnosis not present

## 2014-11-23 DIAGNOSIS — Z9989 Dependence on other enabling machines and devices: Principal | ICD-10-CM

## 2014-11-23 NOTE — Progress Notes (Signed)
Chief Complaint  Patient presents with  . Follow-up    pt following for OSA. pt states he is doing well. Pt using CPAP every night for about 6 - 7 hours. Pressure and mask well for pt. Pt states he is due for a new machine his is 33 years old. no firther concerns. DME: [facility]     History of Present Illness: Julian Washington is a 34 y.o. male with moderate OSA.  He has been doing well with CPAP.  He is worried about travelling with his machine.  He does not have any trouble with his mask fit.  TESTS: HST 12/09/09 ([facility]) >> AHI 27.3, SaO2 low 43% CPAP 10/19/14 to 11/17/14 >> used on 30 of 30 nights with average 7 hrs and 24 min.  Average AHI is 0.5 with median CPAP 15 cm H2O.  Past medical hx >> GERD, Pituitary mass, Anxiety, Depression, HA  Past surgical hx, Medications, Allergies, Family hx, Social hx all reviewed.   Physical Exam: BP 122/80 mmHg  Pulse 79  Temp(Src) 98.8 F (37.1 C) (Oral)  Ht 5\' 11"  (1.803 m)  Wt 382 lb 6.4 oz (173.456 kg)  BMI 53.36 kg/m2  SpO2 98%  General - No distress ENT - No sinus tenderness, no oral exudate, no LAN, MP 4, clear nasal drainage, Rt TM clear Cardiac - s1s2 regular, no murmur Chest - No wheeze/rales/dullness Back - No focal tenderness Abd - Soft, non-tender Ext - No edema Neuro - Normal strength Skin - No rashes Psych - normal mood, and behavior   Assessment/Plan:  Obstructive sleep apnea. He is compliant with CPAP and reports benefit. Plan: - continue CPAP 15 cm H2O - will arrange for new CPAP machine since his current device is more than 33 yrs old  Ear/Sinus congestion. Plan: - advised him to try NielMed sinus rinse (or equivalent) - continue nasacort and allegra  Morbid obesity. Plan: - discussed importance of weight loss  Flu shot today   Chesley Mires, MD [facility] Pager:  [phone]

## 2014-11-23 NOTE — Patient Instructions (Signed)
Will arrange for new CPAP machine  Can try NielMed sinus rinse  Flu shot today  Follow up in 1 year

## 2014-11-30 ENCOUNTER — Encounter: Payer: Self-pay | Admitting: Pulmonary Disease

## 2014-11-30 DIAGNOSIS — G4733 Obstructive sleep apnea (adult) (pediatric): Secondary | ICD-10-CM

## 2014-12-15 ENCOUNTER — Other Ambulatory Visit: Payer: Self-pay | Admitting: Internal Medicine

## 2014-12-15 NOTE — Telephone Encounter (Signed)
Pt is requesting refill on Topamax.  Last OV: 11/14/2014 Last Fill: 01/28/2014 #90 and 2 RF  Okay to refill?

## 2014-12-21 ENCOUNTER — Other Ambulatory Visit: Payer: Self-pay | Admitting: Internal Medicine

## 2015-01-03 ENCOUNTER — Ambulatory Visit (INDEPENDENT_AMBULATORY_CARE_PROVIDER_SITE_OTHER): Payer: 59 | Admitting: Family Medicine

## 2015-01-03 VITALS — BP 136/70 | HR 95 | Temp 98.2°F | Resp 18 | Ht 70.0 in | Wt 384.0 lb

## 2015-01-03 DIAGNOSIS — J0100 Acute maxillary sinusitis, unspecified: Secondary | ICD-10-CM

## 2015-01-03 DIAGNOSIS — H6091 Unspecified otitis externa, right ear: Secondary | ICD-10-CM

## 2015-01-03 DIAGNOSIS — H65191 Other acute nonsuppurative otitis media, right ear: Secondary | ICD-10-CM

## 2015-01-03 MED ORDER — CIPROFLOXACIN-DEXAMETHASONE 0.3-0.1 % OT SUSP: 4.0000 [drp] | Freq: Two times a day (BID) | OTIC | Status: DC

## 2015-01-03 MED ORDER — BENZONATATE 100 MG PO CAPS: 200.0000 mg | ORAL_CAPSULE | Freq: Two times a day (BID) | ORAL | Status: DC | PRN

## 2015-01-03 MED ORDER — AMOXICILLIN-POT CLAVULANATE 875-125 MG PO TABS: 1.0000 | ORAL_TABLET | Freq: Two times a day (BID) | ORAL | Status: DC

## 2015-01-03 NOTE — Patient Instructions (Signed)
Otitis Media, Adult  Otitis media is redness, soreness, and inflammation of the middle ear. Otitis media may be caused by allergies or, most commonly, by infection. Often it occurs as a complication of the common cold.  SIGNS AND SYMPTOMS  Symptoms of otitis media may include:   Earache.   Fever.   Ringing in your ear.   Headache.   Leakage of fluid from the ear.  DIAGNOSIS  To diagnose otitis media, your health care provider will examine your ear with an otoscope. This is an instrument that allows your health care provider to see into your ear in order to examine your eardrum. Your health care provider also will ask you questions about your symptoms.  TREATMENT   Typically, otitis media resolves on its own within 3-5 days. Your health care provider may prescribe medicine to ease your symptoms of pain. If otitis media does not resolve within 5 days or is recurrent, your health care provider may prescribe antibiotic medicines if he or she suspects that a bacterial infection is the cause.  HOME CARE INSTRUCTIONS    If you were prescribed an antibiotic medicine, finish it all even if you start to feel better.   Take medicines only as directed by your health care provider.   Keep all follow-up visits as directed by your health care provider.  SEEK MEDICAL CARE IF:   You have otitis media only in one ear, or bleeding from your nose, or both.   You notice a lump on your neck.   You are not getting better in 3-5 days.   You feel worse instead of better.  SEEK IMMEDIATE MEDICAL CARE IF:    You have pain that is not controlled with medicine.   You have swelling, redness, or pain around your ear or stiffness in your neck.   You notice that part of your face is paralyzed.   You notice that the bone behind your ear (mastoid) is tender when you touch it.  MAKE SURE YOU:    Understand these instructions.   Will watch your condition.   Will get help right away if you are not doing well or get worse.     This  information is not intended to replace advice given to you by your health care provider. Make sure you discuss any questions you have with your health care provider.     Document Released: 11/03/2003 Document Revised: 02/18/2014 Document Reviewed: 08/25/2012  Elsevier Interactive Patient Education 2016 [employer]  Sinusitis, Adult  Sinusitis is redness, soreness, and inflammation of the paranasal sinuses. Paranasal sinuses are air pockets within the bones of your face. They are located beneath your eyes, in the middle of your forehead, and above your eyes. In healthy paranasal sinuses, mucus is able to drain out, and air is able to circulate through them by way of your nose. However, when your paranasal sinuses are inflamed, mucus and air can become trapped. This can allow bacteria and other germs to grow and cause infection.  Sinusitis can develop quickly and last only a short time (acute) or continue over a long period (chronic). Sinusitis that lasts for more than 12 weeks is considered chronic.  CAUSES  Causes of sinusitis include:   Allergies.   Structural abnormalities, such as displacement of the cartilage that separates your nostrils (deviated septum), which can decrease the air flow through your nose and sinuses and affect sinus drainage.   Functional abnormalities, such as when the small hairs (cilia) that   line your sinuses and help remove mucus do not work properly or are not present.  SIGNS AND SYMPTOMS  Symptoms of acute and chronic sinusitis are the same. The primary symptoms are pain and pressure around the affected sinuses. Other symptoms include:   Upper toothache.   Earache.   Headache.   Bad breath.   Decreased sense of smell and taste.   A cough, which worsens when you are lying flat.   Fatigue.   Fever.   Thick drainage from your nose, which often is green and may contain pus (purulent).   Swelling and warmth over the affected sinuses.  DIAGNOSIS  Your health care provider will  perform a physical exam. During your exam, your health care provider may perform any of the following to help determine if you have acute sinusitis or chronic sinusitis:   Look in your nose for signs of abnormal growths in your nostrils (nasal polyps).   Tap over the affected sinus to check for signs of infection.   View the inside of your sinuses using an imaging device that has a light attached (endoscope).  If your health care provider suspects that you have chronic sinusitis, one or more of the following tests may be recommended:   Allergy tests.   Nasal culture. A sample of mucus is taken from your nose, sent to a lab, and screened for bacteria.   Nasal cytology. A sample of mucus is taken from your nose and examined by your health care provider to determine if your sinusitis is related to an allergy.  TREATMENT  Most cases of acute sinusitis are related to a viral infection and will resolve on their own within 10 days. Sometimes, medicines are prescribed to help relieve symptoms of both acute and chronic sinusitis. These may include pain medicines, decongestants, nasal steroid sprays, or saline sprays.  However, for sinusitis related to a bacterial infection, your health care provider will prescribe antibiotic medicines. These are medicines that will help kill the bacteria causing the infection.  Rarely, sinusitis is caused by a fungal infection. In these cases, your health care provider will prescribe antifungal medicine.  For some cases of chronic sinusitis, surgery is needed. Generally, these are cases in which sinusitis recurs more than 3 times per year, despite other treatments.  HOME CARE INSTRUCTIONS   Drink plenty of water. Water helps thin the mucus so your sinuses can drain more easily.   Use a humidifier.   Inhale steam 3-4 times a day (for example, sit in the bathroom with the shower running).   Apply a warm, moist washcloth to your face 3-4 times a day, or as directed by your health care  provider.   Use saline nasal sprays to help moisten and clean your sinuses.   Take medicines only as directed by your health care provider.   If you were prescribed either an antibiotic or antifungal medicine, finish it all even if you start to feel better.  SEEK IMMEDIATE MEDICAL CARE IF:   You have increasing pain or severe headaches.   You have nausea, vomiting, or drowsiness.   You have swelling around your face.   You have vision problems.   You have a stiff neck.   You have difficulty breathing.     This information is not intended to replace advice given to you by your health care provider. Make sure you discuss any questions you have with your health care provider.     Document Released: 01/28/2005 Document Revised:

## 2015-01-03 NOTE — Progress Notes (Signed)
Chief Complaint:  Chief Complaint  Patient presents with  . Otalgia    x1 week friday   . Nasal Congestion    greenish mucous   . Cough    greenish mucous     HPI: Julian Washington is a 34 y.o. male who reports to [facility] today complaining of 1 week hx of right ear pain, dinus pressure, and also green productive sputum.  He has tried ciprodex that he had leftover without relief. He has some mild sore throat. He has no fevers or chills. No msk aches, CP or SOB or wheezing. 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 He was recently ill with viral syndrome, fever and sinus congestion, thsi feels worse, ears hurt. He did not take abx at that time in early October. He feels terrible today. Flu and rapid strep from last visit was negative    Past Medical History  Diagnosis Date  . GERD (gastroesophageal reflux disease)   . Internal hemorrhoids     s/p banding  . Prediabetes     A1C 6.2 years ago  . 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 Anxiety and depression     h/o suicidality w/ citalopram  . Hypogonadism male     Dr Cruzita Lederer, used to see urology  . Vitamin B 12 deficiency     h/o  . Migraine     on topamax  . OSA on CPAP   . Vitamin D deficiency   . Difficult intubation   . 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 ETD (eustachian tube dysfunction)     s/p ENT, declined ear tuves before   . History of chicken pox     Titered on 08/16/2005   Past Surgical History  Procedure Laterality Date  . 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 Tonsillectomy and adenoidectomy     Social History   Social History  . Marital Status: Married    Spouse Name: N/A  . Number of Children: 0  . Years of Education: N/A   Occupational History  . 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 Smoking status: Never Smoker   . Smokeless tobacco: Never Used  . Alcohol Use: No  . Drug Use: No  .  Sexual Activity: Not Asked   Other Topics Concern  . None   Social History Narrative   Lives w/ wife   Family History  Problem Relation Age of Onset  . Prostate cancer Other     GF  . Diabetes Paternal Grandmother   . Hypertension Paternal Grandmother   . Diabetes Paternal Grandfather   . CAD Other     PGF  . Colon cancer Neg Hx    Allergies  Allergen Reactions  . Levaquin [Levofloxacin In D5w] Swelling  . Celexa [Citalopram] Other (See Comments)    Mental status changes   . Dextrans Other (See Comments)    Makes the pt. Drowsy.   . Hydrocodone-Acetaminophen     REACTION: itching  . Latex   . Oxycodone Itching   Prior to Admission medications   Medication Sig Start Date End Date Taking? Authorizing Provider  B Complex Vitamins (B COMPLEX-B12 PO) Take by mouth.   Yes Historical Provider, MD  Calcium Carbonate-Vitamin D (CALTRATE 600+D) 600-400 MG-UNIT per chew tablet Chew 1 tablet by  mouth daily. None Chew   Yes Historical Provider, MD  fexofenadine (ALLEGRA) 180 MG tablet Take 180 mg by mouth daily.   Yes Historical Provider, MD  fluticasone (FLONASE) 50 MCG/ACT nasal spray Place 2 sprays into both nostrils daily.   Yes Historical Provider, MD  Magnesium Oxide (MAG-OXIDE PO) Take 500 mg by mouth daily.   Yes Historical Provider, MD  metFORMIN (GLUCOPHAGE-XR) 500 MG 24 hr tablet Take 2 tablets (1,000 mg total) by mouth daily with breakfast. 11/14/14  Yes Colon Branch, MD  Multiple Vitamin (MULTIVITAMIN) tablet Take 1 tablet by mouth daily.    Yes Historical Provider, MD  pantoprazole (PROTONIX) 40 MG tablet Take 1 tablet (40 mg total) by mouth daily. 12/21/14  Yes Colon Branch, MD  topiramate (TOPAMAX) 100 MG tablet TAKE 1 TABLET BY MOUTH DAILY 12/15/14  Yes Colon Branch, MD  vitamin B-12 (CYANOCOBALAMIN) 250 MCG tablet Take 250 mcg by mouth daily.   Yes Historical Provider, MD     ROS: The patient denies  night sweats, unintentional weight loss, chest pain, palpitations,  wheezing, dyspnea on exertion, nausea, vomiting, abdominal pain, dysuria, hematuria, melena, numbness, weakness, or tingling.   All other systems have been reviewed and were otherwise negative with the exception of those mentioned in the HPI and as above.    PHYSICAL EXAM: Filed Vitals:   01/03/15 1538  BP: 136/70  Pulse: 95  Temp: 98.2 F (36.8 C)  Resp: 18   Body mass index is 55.1 kg/(m^2).   General: Alert, no acute distress. Obese white male HEENT:  Normocephalic, atraumatic, oropharynx patent. EOMI, PERRLA Erythematous throat, no exudates, TM eryhtematous and external can al narrowed, + fluid behind hazy TM , + sinus tenderness, + erythematous/boggy nasal mucosa Cardiovascular:  Regular rate and rhythm, no rubs murmurs or gallops.   Respiratory: Clear to auscultation bilaterally.  No wheezes, rales, or rhonchi.  No cyanosis, no use of accessory musculature Abdominal: No organomegaly, abdomen is soft and non-tender, positive bowel sounds. No masses. Skin: No rashes. Neurologic: Facial musculature symmetric. Psychiatric: Patient acts appropriately throughout our interaction. Lymphatic: No cervical or submandibular lymphadenopathy Musculoskeletal: Gait intact. No edema, tenderness   LABS: Results for orders placed or performed in visit on 11/14/14  POCT rapid strep A  Result Value Ref Range   Rapid Strep A Screen Negative Negative  POCT Influenza A/B  Result Value Ref Range   Influenza A, POC Negative Negative   Influenza B, POC Negative Negative     EKG/XRAY:   Primary read interpreted by Dr. Marin Comment at [facility].   ASSESSMENT/PLAN: Encounter Diagnoses  Name Primary?  . Acute maxillary sinusitis, recurrence not specified Yes  . Right otitis externa   . Acute nonsuppurative otitis media of right ear    Rx augmentin Rx Ciprodex Rx tessalon perles Fu prn   Gross sideeffects, risk and benefits, and alternatives of medications d/w patient. Patient is aware that all  medications have potential sideeffects and we are unable to predict every sideeffect or drug-drug interaction that may occur.  Zadrian Mccauley DO  01/03/2015 4:13 PM

## 2015-01-20 ENCOUNTER — Other Ambulatory Visit: Payer: Self-pay

## 2015-01-20 NOTE — Patient Outreach (Signed)
[place] [facility]) Care Management  01/20/2015  MAHARI ZELADA 02-20-1980 [id number] Left message via e-mail to contact office to schedule Link to Wellness appointment.  Plan to send appointment letter in one week if no response. Peter Garter RN, [facility] Care Management Coordinator-Link to [place] Management [phone]

## 2015-01-25 ENCOUNTER — Encounter: Payer: Self-pay | Admitting: Internal Medicine

## 2015-01-25 ENCOUNTER — Ambulatory Visit (INDEPENDENT_AMBULATORY_CARE_PROVIDER_SITE_OTHER): Payer: 59 | Admitting: Internal Medicine

## 2015-01-25 ENCOUNTER — Ambulatory Visit: Payer: 59 | Admitting: Internal Medicine

## 2015-01-25 VITALS — BP 122/70 | HR 82 | Temp 98.0°F | Ht 70.0 in | Wt 387.4 lb

## 2015-01-25 DIAGNOSIS — E119 Type 2 diabetes mellitus without complications: Secondary | ICD-10-CM | POA: Diagnosis not present

## 2015-01-25 DIAGNOSIS — Z114 Encounter for screening for human immunodeficiency virus [HIV]: Secondary | ICD-10-CM

## 2015-01-25 DIAGNOSIS — H9201 Otalgia, right ear: Secondary | ICD-10-CM

## 2015-01-25 DIAGNOSIS — R7303 Prediabetes: Secondary | ICD-10-CM

## 2015-01-25 DIAGNOSIS — Z09 Encounter for follow-up examination after completed treatment for conditions other than malignant neoplasm: Secondary | ICD-10-CM

## 2015-01-25 DIAGNOSIS — D649 Anemia, unspecified: Secondary | ICD-10-CM

## 2015-01-25 NOTE — Progress Notes (Signed)
Pre visit review using our clinic review tool, if applicable. No additional management support is needed unless otherwise documented below in the visit note. 

## 2015-01-25 NOTE — Progress Notes (Signed)
Subjective:    Patient ID: Julian Washington, male    DOB: 09/30/1980, 34 y.o.   MRN: [id number]  DOS:  01/25/2015 Type of visit - description : Routine checkup Interval history: Diabetes: Good compliance with metformin, due for labs. Obesity: Not doing very well at this time but plans to improve his diet soon. Went to urgent care, DX with sinusitis, better except for mild persistent ear ache.   Review of Systems  Denies nausea, vomiting, diarrhea. No blood in the stools. Occasional has heartburn.  Past Medical History  Diagnosis Date  . GERD (gastroesophageal reflux disease)   . Internal hemorrhoids     s/p banding  . Prediabetes     A1C 6.2 years ago  . 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 Anxiety and depression     h/o suicidality w/ citalopram  . Hypogonadism male     Dr Cruzita Lederer, used to see urology  . Vitamin B 12 deficiency     h/o  . Migraine     on topamax  . OSA on CPAP   . Vitamin D deficiency   . Difficult intubation   . 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 ETD (eustachian tube dysfunction)     s/p ENT, declined ear tuves before   . History of chicken pox     Titered on 08/16/2005    Past Surgical History  Procedure Laterality Date  . 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 Tonsillectomy and adenoidectomy      Social History   Social History  . Marital Status: Married    Spouse Name: N/A  . Number of Children: 0  . Years of Education: N/A   Occupational History  . 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 Smoking status: Never Smoker   . Smokeless tobacco: Never Used  . Alcohol Use: No  . Drug Use: No  . Sexual Activity: Not on file   Other Topics Concern  . Not on file   Social History Narrative   Lives w/ wife        Medication List       This list is accurate as of: 01/25/15 11:59 PM.  Always use your most recent med list.               B COMPLEX-B12 PO  Take by mouth.     CALTRATE 600+D 600-400 MG-UNIT chew tablet  Generic drug:  Calcium Carbonate-Vitamin D  Chew 1 tablet by mouth daily. None Chew     fexofenadine 180 MG tablet  Commonly known as:  ALLEGRA  Take 180 mg by mouth daily.     fluticasone 50 MCG/ACT nasal spray  Commonly known as:  FLONASE  Place 2 sprays into both nostrils daily.     MAG-OXIDE PO  Take 500 mg by mouth daily.     metFORMIN 500 MG 24 hr tablet  Commonly known as:  GLUCOPHAGE-XR  Take 2 tablets (1,000 mg total) by mouth daily with breakfast.     multivitamin tablet  Take 1 tablet by mouth daily.     pantoprazole 40 MG tablet  Commonly known as:  PROTONIX  Take 1 tablet (40 mg total) by mouth daily.     topiramate 100 MG tablet  Commonly known as:  TOPAMAX  TAKE 1 TABLET BY MOUTH DAILY  vitamin B-12 250 MCG tablet  Commonly known as:  CYANOCOBALAMIN  Take 500 mcg by mouth daily.           Objective:   Physical Exam BP 122/70 mmHg  Pulse 82  Temp(Src) 98 F (36.7 C) (Oral)  Ht 5\' 10"  (1.778 m)  Wt 387 lb 6 oz (175.712 kg)  BMI 55.58 kg/m2  SpO2 97% General:   Well developed, well nourished . NAD.  HEENT:  Normocephalic . Face symmetric, atraumatic. TMs: Normal Nose: Not congested Lungs:  CTA B Normal respiratory effort, no intercostal retractions, no accessory muscle use. Heart: RRR,  no murmur.  No pretibial edema bilaterally  Skin: Not pale. Not jaundice Neurologic:  alert & oriented X3.  Speech normal, gait appropriate for age and unassisted Psych--  Cognition and judgment appear intact.  Cooperative with normal attention span and concentration.  Behavior appropriate. No anxious or depressed appearing.      Assessment & Plan:   Assessment > Prediabetes Anxiety depression (suicidality w/  Citalopram)  morbid obesity Vitamin D and  B12 deficiency Chronic anemia  Endocrinology:Dr Gherghe --Pituitary mass --h/o increased  prolactin --Hypogonadism Migraines, on Topamax OSA on CPAP -- DR Halford Chessman Difficulty intubation ET  dysfunction, declined ear tubes Hemorrhoids s/p  banding  Plan  Prediabetes: Check bmp, A1c, LFTs, continue metformin. Morbid obesity: Extensively advise about diet, also low impact exercise; plans to start a new diet in few weeks Vitamin deficiency: Good compliance with supplements Ear discomfort: Exam normal Anemia: GI ROS negative, iron not low, B12 def corrected. Unclear etiology, reasses on RTC primay care-- had a flu shot, check a hiv  RTC 6 months

## 2015-01-25 NOTE — Patient Instructions (Signed)
Get your blood work before you leave      Next visit  for a  Physical exam, fasting in 6 months Please schedule an appointment at the front desk

## 2015-01-26 LAB — BASIC METABOLIC PANEL
BUN: 13 mg/dL (ref 6–23)
CHLORIDE: 111 meq/L (ref 96–112)
CO2: 23 mEq/L (ref 19–32)
Calcium: 8.8 mg/dL (ref 8.4–10.5)
Creatinine, Ser: 0.83 mg/dL (ref 0.40–1.50)
GFR: 112.84 mL/min (ref >60.00)
Glucose, Bld: 128 mg/dL — ABNORMAL HIGH (ref 70–99)
POTASSIUM: 4.1 meq/L (ref 3.5–5.1)
Sodium: 140 mEq/L (ref 135–145)

## 2015-01-26 LAB — HEMOGLOBIN A1C: Hgb A1c MFr Bld: 6.2 % (ref 4.6–6.5)

## 2015-01-26 LAB — AST: AST: 15 U/L (ref 0–37)

## 2015-01-26 LAB — HIV ANTIBODY (ROUTINE TESTING W REFLEX): HIV: NONREACTIVE

## 2015-01-26 LAB — ALT: ALT: 19 U/L (ref 0–53)

## 2015-01-26 NOTE — Assessment & Plan Note (Signed)
Prediabetes: Check bmp, A1c, LFTs, continue metformin. Morbid obesity: Extensively advise about diet, also low impact exercise; plans to start a new diet in few weeks Vitamin deficiency: Good compliance with supplements Ear discomfort: Exam normal Anemia: GI ROS negative, iron not low, B12 def corrected. Unclear etiology, reasses on RTC primay care-- had a flu shot, check a hiv  RTC 6 months

## 2015-01-30 ENCOUNTER — Telehealth: Payer: Self-pay | Admitting: Internal Medicine

## 2015-01-30 NOTE — Telephone Encounter (Signed)
Pt came in late for appt 01/25/15. appt at 8:45am pt came in 9:15am. Pt came in later same day at 2:00pm. Charge or no charge?

## 2015-01-30 NOTE — Telephone Encounter (Signed)
No, thank you

## 2015-02-22 ENCOUNTER — Ambulatory Visit (INDEPENDENT_AMBULATORY_CARE_PROVIDER_SITE_OTHER): Payer: 59 | Admitting: Internal Medicine

## 2015-02-22 ENCOUNTER — Encounter: Payer: Self-pay | Admitting: Internal Medicine

## 2015-02-22 ENCOUNTER — Ambulatory Visit (HOSPITAL_BASED_OUTPATIENT_CLINIC_OR_DEPARTMENT_OTHER)
Admission: RE | Admit: 2015-02-22 | Discharge: 2015-02-22 | Disposition: A | Discharge status: Home / Self Care | Payer: 59 | Source: Ambulatory Visit | Attending: Internal Medicine | Admitting: Internal Medicine

## 2015-02-22 VITALS — BP 118/64 | HR 74 | Temp 98.5°F | Ht 70.0 in | Wt 382.4 lb

## 2015-02-22 DIAGNOSIS — R110 Nausea: Secondary | ICD-10-CM | POA: Diagnosis not present

## 2015-02-22 DIAGNOSIS — K760 Fatty (change of) liver, not elsewhere classified: Secondary | ICD-10-CM | POA: Insufficient documentation

## 2015-02-22 DIAGNOSIS — R1011 Right upper quadrant pain: Secondary | ICD-10-CM | POA: Diagnosis not present

## 2015-02-22 DIAGNOSIS — R197 Diarrhea, unspecified: Secondary | ICD-10-CM | POA: Diagnosis not present

## 2015-02-22 LAB — CBC WITH DIFFERENTIAL/PLATELET
BASOS ABS: 0 10*3/uL (ref 0.0–0.1)
Basophils Relative: 0.5 % (ref 0.0–3.0)
EOS PCT: 1.2 % (ref 0.0–5.0)
Eosinophils Absolute: 0.1 10*3/uL (ref 0.0–0.7)
HEMATOCRIT: 37.3 % — AB (ref 39.0–52.0)
HEMOGLOBIN: 12.4 g/dL — AB (ref 13.0–17.0)
LYMPHS ABS: 2 10*3/uL (ref 0.7–4.0)
Lymphocytes Relative: 26.8 % (ref 12.0–46.0)
MCHC: 33.3 g/dL (ref 30.0–36.0)
MCV: 79.8 fl (ref 78.0–100.0)
MONOS PCT: 6.2 % (ref 3.0–12.0)
Monocytes Absolute: 0.5 10*3/uL (ref 0.1–1.0)
NEUTROS PCT: 65.3 % (ref 43.0–77.0)
Neutro Abs: 4.8 10*3/uL (ref 1.4–7.7)
Platelets: 194 10*3/uL (ref 150.0–400.0)
RBC: 4.67 Mil/uL (ref 4.22–5.81)
RDW: 14.4 % (ref 11.5–15.5)
WBC: 7.3 10*3/uL (ref 4.0–10.5)

## 2015-02-22 LAB — COMPREHENSIVE METABOLIC PANEL
ALK PHOS: 76 U/L (ref 39–117)
ALT: 19 U/L (ref 0–53)
AST: 15 U/L (ref 0–37)
Albumin: 4.2 g/dL (ref 3.5–5.2)
BUN: 16 mg/dL (ref 6–23)
CO2: 25 meq/L (ref 19–32)
Calcium: 9.3 mg/dL (ref 8.4–10.5)
Chloride: 108 mEq/L (ref 96–112)
Creatinine, Ser: 0.79 mg/dL (ref 0.40–1.50)
GFR: 119.41 mL/min (ref >60.00)
GLUCOSE: 107 mg/dL — AB (ref 70–99)
POTASSIUM: 4 meq/L (ref 3.5–5.1)
Sodium: 140 mEq/L (ref 135–145)
TOTAL PROTEIN: 6.6 g/dL (ref 6.0–8.3)
Total Bilirubin: 0.4 mg/dL (ref 0.2–1.2)

## 2015-02-22 LAB — URINALYSIS, ROUTINE W REFLEX MICROSCOPIC
BILIRUBIN URINE: NEGATIVE
Hgb urine dipstick: NEGATIVE
KETONES UR: NEGATIVE
Leukocytes, UA: NEGATIVE
Nitrite: NEGATIVE
PH: 7 (ref 5.0–8.0)
RBC / HPF: NONE SEEN (ref 0–?)
SPECIFIC GRAVITY, URINE: 1.015 (ref 1.000–1.030)
Total Protein, Urine: NEGATIVE
Urine Glucose: NEGATIVE
Urobilinogen, UA: 0.2 (ref 0.0–1.0)
WBC UA: NONE SEEN (ref 0–?)

## 2015-02-22 NOTE — Progress Notes (Signed)
Pre visit review using our clinic review tool, if applicable. No additional management support is needed unless otherwise documented below in the visit note. 

## 2015-02-22 NOTE — Progress Notes (Signed)
Subjective:    Patient ID: Julian Washington, male    DOB: [birth year], 35 y.o.   MRN: [id number]  DOS:  02/22/2015 Type of visit - description : Acute visit Interval history: Symptoms started 5 days ago with right upper quadrant abdominal pain, usually postprandial, may linger for a few hours; also having consistent diarrhea one or 2 hours postprandially. Stools are loose, explosive. Abdominal pain usually decreased after an episode of diarrhea, no radiation, the pain is described as a dull ache.    Review of Systems Denies fever chills, appetite is somewhat decreased + Postprandial nausea, no vomiting, no blood in the stools or mucus in the stools. No recent abx,  not taking excessive Motrin. No rash No dysuria or gross hematuria  Past Medical History  Diagnosis Date  . GERD (gastroesophageal reflux disease)   . Internal hemorrhoids     s/p banding  . Prediabetes     A1C 6.2 years ago  . 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 Anxiety and depression     h/o suicidality w/ citalopram  . Hypogonadism male     Dr Cruzita Lederer, used to see urology  . Vitamin B 12 deficiency     h/o  . Migraine     on topamax  . OSA on CPAP   . Vitamin D deficiency   . Difficult intubation   . 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 ETD (eustachian tube dysfunction)     s/p ENT, declined ear tuves before   . History of chicken pox     Titered on 08/16/2005    Past Surgical History  Procedure Laterality Date  . 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 Tonsillectomy and adenoidectomy      Social History   Social History  . Marital Status: Married    Spouse Name: N/A  . Number of Children: 0  . Years of Education: N/A   Occupational History  . 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 Smoking status: Never Smoker   . Smokeless tobacco: Never Used  . Alcohol Use: No  . Drug Use: No  . Sexual  Activity: Not on file   Other Topics Concern  . Not on file   Social History Narrative   Lives w/ wife        Medication List       This list is accurate as of: 02/22/15  4:56 PM.  Always use your most recent med list.               B COMPLEX-B12 PO  Take by mouth.     CALTRATE 600+D 600-400 MG-UNIT chew tablet  Generic drug:  Calcium Carbonate-Vitamin D  Chew 1 tablet by mouth daily. None Chew     fexofenadine 180 MG tablet  Commonly known as:  ALLEGRA  Take 180 mg by mouth daily.     fluticasone 50 MCG/ACT nasal spray  Commonly known as:  FLONASE  Place 2 sprays into both nostrils daily.     MAG-OXIDE PO  Take 500 mg by mouth daily.     metFORMIN 500 MG 24 hr tablet  Commonly known as:  GLUCOPHAGE-XR  Take 2 tablets (1,000 mg total) by mouth daily with breakfast.     multivitamin tablet  Take 1 tablet by mouth daily.  pantoprazole 40 MG tablet  Commonly known as:  PROTONIX  Take 1 tablet (40 mg total) by mouth daily.     topiramate 100 MG tablet  Commonly known as:  TOPAMAX  TAKE 1 TABLET BY MOUTH DAILY     vitamin B-12 250 MCG tablet  Commonly known as:  CYANOCOBALAMIN  Take 500 mcg by mouth daily.           Objective:   Physical Exam BP 118/64 mmHg  Pulse 74  Temp(Src) 98.5 F (36.9 C) (Oral)  Ht 5\' 10"  (1.778 m)  Wt 382 lb 6 oz (173.444 kg)  BMI 54.87 kg/m2  SpO2 99% General:   Well developed, well nourished . NAD.  HEENT:  Normocephalic . Face symmetric, atraumatic Lungs:  CTA B Normal respiratory effort, no intercostal retractions, no accessory muscle use. Heart: RRR,  no murmur.  no pretibial edema bilaterally  Abdomen:  Exam limited by patient's size however he is moderately tender on the right upper quadrant without mass or rebound. Rest of the abdomen is unremarkable Skin: Not pale. Not jaundice Neurologic:  alert & oriented X3.  Speech normal, gait appropriate for age and unassisted Psych--  Cognition and judgment  appear intact.  Cooperative with normal attention span and concentration.  Behavior appropriate. No anxious or depressed appearing.    Assessment & Plan:   Assessment > Prediabetes Anxiety depression (suicidality w/  Citalopram)  morbid obesity Vitamin D and  B12 deficiency Chronic anemia  Endocrinology:Dr Gherghe --Pituitary mass --h/o increased prolactin --Hypogonadism Migraines, on Topamax OSA on CPAP -- DR Halford Chessman Difficulty intubation ET  dysfunction, declined ear tubes Hemorrhoids s/p  banding  Plan  Right upper quadrant abdominal pain and diarrhea:  DDX includes GB, focal colitis, less likely diverticulitis, others. We'll get a CBC, CMP, UA and ultrasound Patient to call or ER if symptoms increase, see instructions If workup negative will consider a CT noting he had similar symptoms back in 2012, had a CT, ultrasound and HIDA scan. Addendum: Workup unremarkable, results discussed with the patient, we agreed on wait-and-see for a couple of days, consider CT versus a HIDA scan

## 2015-02-22 NOTE — Patient Instructions (Signed)
BEFORE YOU LEAVE THE OFFICE:  GO TO THE LAB  Get the blood work  and provide a urine sample We are working on getting ultrasound of the abdomen today  AFTER YOU LEAVE THE OFFICE: Follow-up bland diet To the ER if severe or persistent pain, fever, chills, blood in the stools.

## 2015-03-10 MED FILL — TOPIRAMATE 100 MG TABLET: 100 | 90 days supply | Qty: 90 | Fill #1

## 2015-04-04 MED FILL — PANTOPRAZOLE SOD DR 40 MG T: 40 | 90 days supply | Qty: 90 | Fill #1

## 2015-04-06 ENCOUNTER — Other Ambulatory Visit: Payer: Self-pay

## 2015-04-06 VITALS — BP 118/76 | HR 74 | Resp 16 | Ht 71.0 in | Wt 368.8 lb

## 2015-04-06 DIAGNOSIS — R7303 Prediabetes: Secondary | ICD-10-CM

## 2015-04-06 NOTE — Patient Outreach (Signed)
[place] [facility]) Care Management   04/06/2015  YACQUB CICOTTE [birth year] [id number]  Julian Washington is an 35 y.o. male.   Member seen for follow up office visit for Link to Wellness program for self management of Prediabtes.  Subjective: Member states that he has rejoined [employer] and he has lost 10 lb this month.  States he is to see Dr.Gherghe on 07/06/15.  States he has not been exercising.   Objective:   Review of Systems  All other systems reviewed and are negative.   Physical Exam  Today's Vitals   04/06/15 0912  BP: 118/76  Pulse: 74  Resp: 16  Height: 1.803 m (5\' 11" )  Weight: 368 lb 12.8 oz (167.287 kg)  SpO2: 98%  PainSc: 0-No pain    Current Medications:   Current Outpatient Prescriptions  Medication Sig Dispense Refill  . B Complex Vitamins (B COMPLEX-B12 PO) Take by mouth.    . Calcium Carbonate-Vitamin D (CALTRATE 600+D) 600-400 MG-UNIT per chew tablet Chew 1 tablet by mouth daily. None Chew    . fexofenadine (ALLEGRA) 180 MG tablet Take 180 mg by mouth daily.    . fluticasone (FLONASE) 50 MCG/ACT nasal spray Place 2 sprays into both nostrils daily.    . Magnesium Oxide (MAG-OXIDE PO) Take 500 mg by mouth daily.    . metFORMIN (GLUCOPHAGE-XR) 500 MG 24 hr tablet Take 2 tablets (1,000 mg total) by mouth daily with breakfast. 180 tablet 2  . Multiple Vitamin (MULTIVITAMIN) tablet Take 1 tablet by mouth daily.     . pantoprazole (PROTONIX) 40 MG tablet Take 1 tablet (40 mg total) by mouth daily. 90 tablet 3  . topiramate (TOPAMAX) 100 MG tablet TAKE 1 TABLET BY MOUTH DAILY 90 tablet 1  . vitamin B-12 (CYANOCOBALAMIN) 250 MCG tablet Take 500 mcg by mouth daily.      No current facility-administered medications for this visit.    Functional Status:   In your present state of health, do you have any difficulty performing the following activities: 04/06/2015 11/14/2014  Hearing? N N  Vision? N N  Difficulty concentrating or making decisions? N N   Walking or climbing stairs? N Y  Dressing or bathing? N N  Doing errands, shopping? N N    Fall/Depression Screening:    PHQ 2/9 Scores 04/06/2015 01/03/2015 11/14/2014 10/07/2014  PHQ - 2 Score 0 0 0 0    Assessment:   Member seen for follow up office visit for Link to Wellness program for self management of prediabetes.  Member is meeting self management goal of hemoglobin A1C of 6.4 or less with last reading of 6.1.  Member is working on [employer] loss with [employer].  Member is not exercising.  Reports taking medications without difficulty.  Member is up to date with annual eye exam and dental check ups.    Plan:   1. Plan to eat 45-60 GM (3-4) servings of carbohydrate a meal and 15 GM for snacks. 2. Plan to continue to follow [employer] and lose 1-2 lbs a week 3. Plan to walk once a week for 20 minutes 4. Plan to see Dr. Cruzita Lederer on 07/06/15 5. Plan for next Link to Wellness visit at 08/31/15 at [place] Problem One        Most Recent Value   Care Plan Problem One  Potential for elevated blood glucoses   Role Documenting the Problem One  Care Management Coordinator   Care  Plan for Problem One  Active   THN Long Term Goal (31-90 days)  Member will keep hemoglobin A1C 6.4 or below until next Link to Wellness visit 08/31/15   THN Long Term Goal Start Date  04/07/15 [Last Hemoglobin A1C 6.1]   Interventions for Problem One Long Term Goal  Reviewed CHO counting and portion control, Encourged to continue to follow the [employer] program and losing weight, Reinforced importance of regular exercise for glycemic control    [facility] CM Care Plan Problem Two        Most Recent Value   Care Plan Problem Two  Member requesting benefit excetion for MRI to be covered at [facility] rate at a non [facility] facility   Role Documenting the Problem Two  Care Management [place] for Problem Two  Not Active    Peter Garter RN, [facility] Care Management Coordinator-Link to  [place] Management [phone]

## 2015-04-07 NOTE — Patient Instructions (Signed)
1. Plan to eat 45-60 GM (3-4) servings of carbohydrate a meal and 15 GM for snacks. 2. Plan to continue to follow [employer] and lose 1-2 lbs a week 3. Plan to walk once a week for 20 minutes 4. Plan to see Dr. Cruzita Lederer on 07/06/15 5. Plan to see Dr. Larose Kells on 07/26/15 6. Plan for next Link to Wellness visit at 08/31/15 at [facility]

## 2015-04-19 DIAGNOSIS — H0012 Chalazion right lower eyelid: Secondary | ICD-10-CM | POA: Diagnosis not present

## 2015-04-19 MED FILL — NEO/POLY/DEXAMET EYE OINT: 3.5-10000-0 | 7 days supply | Qty: 4 | Fill #0

## 2015-04-26 MED FILL — METFORMIN HCL ER 500 MG TAB: 500 | 90 days supply | Qty: 180 | Fill #3

## 2015-06-02 DIAGNOSIS — G4733 Obstructive sleep apnea (adult) (pediatric): Secondary | ICD-10-CM | POA: Diagnosis not present

## 2015-06-05 ENCOUNTER — Encounter: Payer: Self-pay | Admitting: Internal Medicine

## 2015-06-08 ENCOUNTER — Telehealth: Payer: Self-pay | Admitting: Internal Medicine

## 2015-06-08 MED ORDER — VERAPAMIL HCL ER 120 MG PO TBCR: 120.0000 mg | EXTENDED_RELEASE_TABLET | Freq: Every day | ORAL | Status: DC

## 2015-06-08 MED ORDER — TOPIRAMATE 50 MG PO TABS: ORAL_TABLET | ORAL | Status: DC

## 2015-06-08 MED FILL — TOPIRAMATE 50 MG TABLET: 50 | 28 days supply | Qty: 21 | Fill #0

## 2015-06-08 MED FILL — VERAPAMIL ER 120 MG TABLET: 120 | 30 days supply | Qty: 30 | Fill #0

## 2015-06-08 NOTE — Telephone Encounter (Signed)
Advise patient, we are changing his migraine preventive medication per his request. Start Calan SR 120 mg one tablet daily #30 and 3 refills Decrease  Topamax to 50 mg one tablet daily for 2 weeks, then half tablet daily for 2 weeks, then stop Advise patient to check BP and pulse every other day, call if BP less than 110/65 or pulse less than 55 or if side effects from the medication. Schedule office visit in 4 weeks

## 2015-06-08 NOTE — Telephone Encounter (Signed)
Discussed with patient and he verbalized understanding, he wrote the directions and I repeated them twice. He verbalized understanding and has agreed to follow. He will call to schedule an apt in 4 weeks.    KP

## 2015-06-23 DIAGNOSIS — H5213 Myopia, bilateral: Secondary | ICD-10-CM | POA: Diagnosis not present

## 2015-06-23 LAB — HM DIABETES EYE EXAM

## 2015-06-28 ENCOUNTER — Encounter: Payer: Self-pay | Admitting: Internal Medicine

## 2015-07-03 MED FILL — PANTOPRAZOLE SOD DR 40 MG T: 40 | 90 days supply | Qty: 90 | Fill #2

## 2015-07-06 ENCOUNTER — Ambulatory Visit (INDEPENDENT_AMBULATORY_CARE_PROVIDER_SITE_OTHER): Payer: 59 | Admitting: Internal Medicine

## 2015-07-06 ENCOUNTER — Other Ambulatory Visit (INDEPENDENT_AMBULATORY_CARE_PROVIDER_SITE_OTHER): Payer: 59 | Admitting: *Deleted

## 2015-07-06 ENCOUNTER — Encounter: Payer: Self-pay | Admitting: Internal Medicine

## 2015-07-06 VITALS — BP 118/60 | HR 93 | Temp 98.2°F | Resp 12 | Wt 364.0 lb

## 2015-07-06 DIAGNOSIS — R7303 Prediabetes: Secondary | ICD-10-CM

## 2015-07-06 DIAGNOSIS — Z87898 Personal history of other specified conditions: Secondary | ICD-10-CM

## 2015-07-06 DIAGNOSIS — E230 Hypopituitarism: Secondary | ICD-10-CM

## 2015-07-06 DIAGNOSIS — D352 Benign neoplasm of pituitary gland: Secondary | ICD-10-CM

## 2015-07-06 DIAGNOSIS — Z8639 Personal history of other endocrine, nutritional and metabolic disease: Secondary | ICD-10-CM

## 2015-07-06 LAB — POCT GLYCOSYLATED HEMOGLOBIN (HGB A1C): HEMOGLOBIN A1C: 5.8

## 2015-07-06 NOTE — Patient Instructions (Signed)
Please continue Metformin ER 1000 mg daily at dinnertime.  Please come back for a follow-up appointment in 6 months.

## 2015-07-06 NOTE — Progress Notes (Signed)
Patient ID: Julian Washington, male   DOB: 04-25-1980, 35 y.o.   MRN: [id number]  HPI: Julian GLASPELL is a 35 y.o.-year-old man, returning for f/u for hypogonadotropic hypogonadism, history of pituitary adenoma, s/p TSR, and prediabetes. Last visit 1 year ago.  History of pituitary adenoma: The patient has been diagnosed with hypogonadotropic hypogonadism around 2012, after trying to achieve a pregnancy. He tried testosterone replacement and clomiphene (which did not work), a cousin who is a medical resident suggested to have further investigation to find out the cause for his hypogonadism. His urologist at that time ordered a pituitary MRI and he has been found to have a pituitary microadenoma on MRI in 12/2011. He was referred to neurosurgery - Dr Luther Hearing - and he ended up having TSR in 02/2012. Surgical pathology showed:  The sections show monomorphic nests of neoplastic cells with neuroendocrine nuclear features, consistent with a pituitary adenoma. The tumor is strongly and diffusely immunoreactive for prolactin (100% of cells). There is patchy staining for adrenocorticotropic hormone (less than 5% of cells). The cells are negative for TSH, HGH, FSH, and beta-LH.   His highest prolactin level was 44.7 before the surgery. This has decreased after the surgery.  I reviewed patient's pertinent labs per records from [place] [facility] [place]: 11/2010: TSH 2 02/2011: Testosterone 0.89, free testosterone 2.2 (9.3-26.5) 11/17/2011: TSH 2.35, FSH 2.08, LH 1.44, testosterone 0.08 (2.41-8.27) 12/17/2011: Prolactin 44.7, estradiol 23, cortisol 2.9, hemoglobin A1c 6.2% 02/19/2012: TSH 1.9, total T3 106, free T4 1, hemoglobin A1c 5.8% 03/05/2012: Prolactin 1.4, cortisol 37.2, hemoglobin A1c 5.6%  04/02/2012: Prolactin 3.2, testosterone 79 ([phone]), hemoglobin A1c 6.1%  06/19/2012: Total testosterone 119 ([phone]), free testosterone 23.5 (40-224), SHBG 17 (10-50), LH  3.4, FSH  4.7, ACTH 20, cortisol 6.5, TSH 3.072, free T4 0.9 09/23/2012: Total testosterone 115, free testosterone 20.5, SHBG 20 04/02/2013: Total testosterone 76, free testosterone 15.6, SHBG 15  04/26/2013: TSH 2.34, free T4 0.9  At last visit, we checked his pituitary tests and I returned normal, except low testosterone associated with inappropriately normal LH and FSH. Component     Latest Ref Rng 07/08/2014  Testosterone     300 - 890 ng/dL 106 (L)  Sex Hormone Binding     10 - 50 nmol/L 17  Testosterone Free     47.0 - 244.0 pg/mL 27.2 (L)  Testosterone-% Free     1.6 - 2.9 % 2.6  IGF-I, LC/MS     53 - 331 ng/mL 59  Z-Score (Male)     -2.0-+2.0 SD -1.8  FSH     1.4 - 18.1 mIU/mL 4.1  LH     1.5 - 9.3 mIU/mL 3.1  Cortisol, Plasma      5.1  C206 ACTH     6 - 50 pg/mL 21  Prolactin     2.1 - 17.1 ng/mL 2.4  Estradiol      28.3  Quantitative HCG      0.21  Hemoglobin A1C     4.6 - 6.5 % 5.9  TSH     0.35 - 4.50 uIU/mL 2.71  Free T4     0.60 - 1.60 ng/dL 0.76  PSA     0.10 - 4.00 ng/mL 0.15  T3, Free     2.3 - 4.2 pg/mL 3.3   Pituitary MRI 12/28/2011: Heterogeneous signal intensity lesion centered in the anterior aspect of the sella measures approximately 1 cm in maximum dimension and demonstrates a fluid hematocrit  level with some intrinsic T1 bright/T2 dark signal along its caudal margin consistent with subacute hemorrhage. There is no mass effect on the optic chiasm or evidence of cavernous sinus invasion.  Pituitary MRI 09/2012: Right lateral sellar soft tissue, likely residual pituitary 0.6 x 0.4 x 0.9 cm, homogeneous postcontrast enhancement, abuts medial aspect of the right cavernous ICA. No mass effect.  Pituitary MRI (10/05/2014) - which he had since last visit: No residual tumor.  Hypogonadotropic hypogonadism: Diagnosed when he and his wife were trying to get pregnant. They are preparing for IVF at [facility] >> wife's diabetes is not under control and he  needs to be between 6 and 7% for the procedure. They are seeing reproductive endocrinology: Dr Robbie Louis, MD.  He started to have low T sxs 2011-2012.  He was previously on Testosterone: axillar gel (did not like it as he was always fearing that he can transfer this to his wife) and inj (200 mg 2x a month). On this >> T level normalized, however he cannot do the injections himself. Clomid did not help (3 mo) >> lost capacity to ejaculate and got hot flushes when stopped.   He has decreased libido.  No difficulty obtaining but has pbs maintaining an erection No trauma to testes, testicular irradiation or surgery No h/o of mumps orchitis/h/o autoimmune ds. No h/o cryptorchidism He grew and went through puberty like his peers No shrinking of testes. + small testes (<5 ml) No incomplete/delayed sexual development     No breast discomfort/gynecomastia    No loss of body hair (axillary/pubic)/decreased need for shaving No height loss No abnormal sense of smell  No hot flushes No vision problems No worst HA of his life, but had HA No FH of hypogonadism/infertility  No FH of hemochromatosis or pituitary tumors No excessive weight gain or loss.  No chronic diseases No chronic pain. Not on opiates, does not take steroids.  No more than 2 drinks a day of alcohol at a time, and this is rarely No anabolic steroids use No herbal medicines Not on antidepressants  No AI ds in his family, no FH of MS.  He does not have family history of early cardiac disease. GF with AMI at 34 y/o.   He has been diagnosed with OSA in 2011. He is now on a CPAP machine. He worked night shifts, but now works days.   He also prediabetes. Last Hba1c: Lab Results  Component Value Date   HGBA1C 6.2 01/25/2015   HGBA1C 5.9 07/08/2014  Prev. 5.9%, prev. 6.2% and 6.3% (see above)  He is on Metformin ER 1000 mg at night. Tolerates it well.  Diet: - Breakfast: Peanut butter sandwich, 2 eggs, yogurt and  granola - Lunch: Meat and 2 veggies - Dinner: Meat and 2 veggies - Snacks: 1-2  Obesity: He tried Weight Watchers since last visit >> reached a plateau, however, he was able to lose 18 pounds in the last 5 months. He is trying to go through the diet along with his wife, however, she is frequently hungry and he gets into temptation about eating higher calorie foods.  He also has a history of B12 deficiency.  ROS: Constitutional: + weight loss, no fatigue, no subjective hyperthermia/hypothermia Eyes: no blurry vision, no xerophthalmia ENT: no sore throat, no nodules palpated in throat, no dysphagia/odynophagia, no hoarseness, + decreased hearing Cardiovascular: no CP/SOB/palpitations/+ leg swelling Respiratory: no cough/SOB Gastrointestinal: no N/V/D/C Musculoskeletal: no muscle/joint aches Skin: + Rash on bilateral shins-  red-purple Neurological: no tremors/numbness/tingling/dizziness  I reviewed pt's medications, allergies, PMH, social hx, family hx, and changes were documented in the history of present illness. Otherwise, unchanged from my initial visit note.  Past Medical History  Diagnosis Date  . GERD (gastroesophageal reflux disease)   . Internal hemorrhoids     s/p banding  . Prediabetes     A1C 6.2 years ago  . 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[place])     h/o increased prolactin, Dr Cruzita Lederer (previously @ [facility])  . Anxiety and depression     h/o suicidality w/ citalopram  . Hypogonadism male     Dr Cruzita Lederer, used to see urology  . Vitamin B 12 deficiency     h/o  . Migraine     on topamax  . OSA on CPAP   . Vitamin D deficiency   . Difficult intubation   . 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 ETD (eustachian tube dysfunction)     s/p ENT, declined ear tuves before   . History of chicken pox     Titered on 08/16/2005   Past Surgical History  Procedure Laterality Date  . 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 Tonsillectomy and adenoidectomy      Social History   Social History  . Marital Status: Married    Spouse Name: N/A  . Number of Children: 0  . Years of Education: N/A   Occupational History  . 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 Smoking status: Never Smoker   . Smokeless tobacco: Never Used  . Alcohol Use: No  . Drug Use: No  . Sexual Activity: Not on file   Other Topics Concern  . Not on file   Social History Narrative   Lives w/ wife   Current Outpatient Prescriptions on File Prior to Visit  Medication Sig Dispense Refill  . B Complex Vitamins (B COMPLEX-B12 PO) Take by mouth.    . Calcium Carbonate-Vitamin D (CALTRATE 600+D) 600-400 MG-UNIT per chew tablet Chew 1 tablet by mouth daily. None Chew    . fexofenadine (ALLEGRA) 180 MG tablet Take 180 mg by mouth daily.    . fluticasone (FLONASE) 50 MCG/ACT nasal spray Place 2 sprays into both nostrils daily.    . Magnesium Oxide (MAG-OXIDE PO) Take 500 mg by mouth daily.    . metFORMIN (GLUCOPHAGE-XR) 500 MG 24 hr tablet Take 2 tablets (1,000 mg total) by mouth daily with breakfast. 180 tablet 2  . Multiple Vitamin (MULTIVITAMIN) tablet Take 1 tablet by mouth daily.     . pantoprazole (PROTONIX) 40 MG tablet Take 1 tablet (40 mg total) by mouth daily. 90 tablet 3  . topiramate (TOPAMAX) 50 MG tablet 1 tablet a day x 2 weeks, then 1/2 tablet a day x 2 weeks, then stop 30 tablet 0  . verapamil (CALAN SR) 120 MG CR tablet Take 1 tablet (120 mg total) by mouth at bedtime. 30 tablet 3  . vitamin B-12 (CYANOCOBALAMIN) 250 MCG tablet Take 500 mcg by mouth daily.      No current facility-administered medications on file prior to visit.   Allergies  Allergen Reactions  . Levaquin [Levofloxacin In D5w] Swelling  . Celexa [Citalopram] Other (See Comments)    Mental status changes   . Dextrans Other (See Comments)    Makes the pt. Drowsy.   . Hydrocodone-Acetaminophen     REACTION: itching  .  Latex   . Oxycodone Itching   Family History   Problem Relation Age of Onset  . Prostate cancer Other     GF  . Diabetes Paternal Grandmother   . Hypertension Paternal Grandmother   . Diabetes Paternal Grandfather   . CAD Other     PGF  . Colon cancer Neg Hx     PE: BP 118/60 mmHg  Pulse 93  Temp(Src) 98.2 F (36.8 C) (Oral)  Resp 12  Wt 364 lb (165.109 kg)  SpO2 96% Body mass index is 50.79 kg/(m^2). Wt Readings from Last 3 Encounters:  07/06/15 364 lb (165.109 kg)  04/06/15 368 lb 12.8 oz (167.287 kg)  02/22/15 382 lb 6 oz (173.444 kg)   Constitutional: Obese, in NAD Eyes: PERRLA, EOMI, no exophthalmos ENT: moist mucous membranes, no thyromegaly, no cervical lymphadenopathy Cardiovascular: RRR, No MRG, + Bilateral leg swelling Respiratory: CTA B Gastrointestinal: abdomen soft, NT, ND, BS+ Musculoskeletal: no deformities, strength intact in all 4 Skin: moist, warm, + Rash on bilateral shins - ? necrobiosis lipoidica diabeticorum Neurological: no tremor with outstretched hands, DTR normal in all 4 Genital exam: Deferred  Reviewed genital exam results from last visit: normal male escutcheon, no inguinal LAD, small phallus, testes ~15 mL R and ~12 mL L mL, no testicular masses, no penile discharge.   ASSESSMENT: 1. H/o Pituitary adenoma (prolactinoma) with pituitary apoplexy - status post TSR 02/2012  2. Hypogonadotropic hypogonadism  3. Prediabetes  4. Obesity  PLAN:  1. History of pituitary adenoma - Patient had a 1 cm pituitary adenoma, which was resected through TSR, with the staining revealing a prolactinoma - His Pituitary labs were normal, except for low testosterone and inappropriately normal LH and FSH per last visit's check. I do not feel we need to repeat the labs now. - MRI follow-up is performed by Dr. Salomon Fick (neurosurgery). I reviewed the last MRI report from 2016 along with the patient. This shows no recurrence of the tumor.  2. Hypogonadotropic hypogonadism  - Patient has had a long history of  hypogonadotropic hypogonadism, with fairly poor response to treatment based on the available testosterone levels - I am not completely convinced that his hypogonadism is related to his pituitary tumor, since his prolactin level was only 44.7, which is elevated, but not enough to suppress the testosterone to such low levels. At last visit, we checked his testosterone, along with LH, FSH (which confirmed hypogonadotropic hypogonadism), and we also checked Beta hCG, IBC panel to look for other etiologies for his very low testosterone level (these were all normal).  - We discussed about treatment of his hypogonadism, which consists of testosterone replacement especially since he did not benefit from Clomid in the past. In the setting of pregnancy attempts, testosterone is not the best treatment, and he is aware of this. He was able to give a semen specimen for IVF, however, due to his wife's diabetes being out of control, the IVF could not be done. He tells me that he will need to give her fresh specimen for IVF in the future, therefore, testosterone treatment is not an option right now. He agrees with this and will let me know after the IVF when we can start testosterone.  3. Prediabetes - Reviewed last hemoglobin A1c, which was 6.2% - We repeated the HbA1c today >> 5.8% (decreased) - Continue metformin ER 1000 mg at night - Weight loss will greatly help  4. Obesity  - He was successful in losing 18 pounds in  the last 4 months, for which I congratulated him. I strongly advised him to continue with the Weight Watchers program  - time spent with the patient: 40 minutes, of which >50% was spent in obtaining information about his symptoms, reviewing his previous labs, evaluations, and treatments, counseling him about his conditions (please see the discussed topics above); he had a number of questions which I addressed.

## 2015-07-14 MED FILL — VERAPAMIL ER 120 MG TABLET: 120 | 30 days supply | Qty: 30 | Fill #1

## 2015-07-26 ENCOUNTER — Encounter: Payer: Self-pay | Admitting: Internal Medicine

## 2015-07-26 ENCOUNTER — Ambulatory Visit (INDEPENDENT_AMBULATORY_CARE_PROVIDER_SITE_OTHER): Payer: 59 | Admitting: Internal Medicine

## 2015-07-26 VITALS — BP 122/64 | HR 70 | Temp 98.2°F | Ht 70.0 in | Wt 373.1 lb

## 2015-07-26 DIAGNOSIS — Z0000 Encounter for general adult medical examination without abnormal findings: Secondary | ICD-10-CM

## 2015-07-26 DIAGNOSIS — D649 Anemia, unspecified: Secondary | ICD-10-CM

## 2015-07-26 DIAGNOSIS — R739 Hyperglycemia, unspecified: Secondary | ICD-10-CM

## 2015-07-26 LAB — BASIC METABOLIC PANEL
BUN: 13 mg/dL (ref 6–23)
CALCIUM: 9.1 mg/dL (ref 8.4–10.5)
CO2: 27 meq/L (ref 19–32)
Chloride: 106 mEq/L (ref 96–112)
Creatinine, Ser: 0.69 mg/dL (ref 0.40–1.50)
GFR: 139.24 mL/min (ref >60.00)
Glucose, Bld: 107 mg/dL — ABNORMAL HIGH (ref 70–99)
POTASSIUM: 4.4 meq/L (ref 3.5–5.1)
SODIUM: 139 meq/L (ref 135–145)

## 2015-07-26 LAB — HEMOGLOBIN: HEMOGLOBIN: 12.2 g/dL — AB (ref 13.0–17.0)

## 2015-07-26 LAB — MICROALBUMIN / CREATININE URINE RATIO
Creatinine,U: 79.5 mg/dL
MICROALB/CREAT RATIO: 1.6 mg/g (ref 0.0–30.0)
Microalb, Ur: 1.3 mg/dL (ref 0.0–1.9)

## 2015-07-26 LAB — LIPID PANEL
Cholesterol: 115 mg/dL (ref 0–200)
HDL: 40.6 mg/dL (ref >39.00)
LDL Cholesterol: 62 mg/dL (ref 0–99)
NonHDL: 74.39
TRIGLYCERIDES: 60 mg/dL (ref 0.0–149.0)
Total CHOL/HDL Ratio: 3
VLDL: 12 mg/dL (ref 0.0–40.0)

## 2015-07-26 LAB — FOLATE: Folate: 22.2 ng/mL (ref >5.9)

## 2015-07-26 LAB — IRON: Iron: 48 ug/dL (ref 42–165)

## 2015-07-26 LAB — FERRITIN: Ferritin: 79.2 ng/mL (ref 22.0–322.0)

## 2015-07-26 LAB — VITAMIN B12

## 2015-07-26 MED FILL — METFORMIN HCL ER 500 MG TAB: 500 | 30 days supply | Qty: 60 | Fill #4

## 2015-07-26 NOTE — Progress Notes (Signed)
Subjective:    Patient ID: Julian Washington, male    DOB: 1980/03/05, 35 y.o.   MRN: [id number]  DOS:  07/26/2015 Type of visit - description : CPX Interval history: Few weeks ago was switch from Topamax to verapamil, is working great for headache prevention but the chronic lower extremity edema is a slightly worse.  also gained weight, admits she is not eating healthy.   Wt Readings from Last 3 Encounters:  07/26/15 373 lb 2 oz (169.248 kg)  07/06/15 364 lb (165.109 kg)  04/06/15 368 lb 12.8 oz (167.287 kg)     Review of Systems Constitutional: No fever. No chills. No unexplained wt changes. No unusual sweats  HEENT: No dental problems, no ear discharge, no facial swelling, no voice changes. No eye discharge, no eye  redness , no  intolerance to light   Respiratory: No wheezing , no  difficulty breathing. No cough , no mucus production  Cardiovascular: No CP , no  Palpitations  GI: no nausea, no vomiting, no diarrhea , no  abdominal pain.  No blood in the stools. No dysphagia, no odynophagia    Endocrine: No polyphagia, no polyuria , no polydipsia  GU: No dysuria, gross hematuria, difficulty urinating. No urinary urgency, no frequency.  Musculoskeletal: No joint swellings or unusual aches or pains  Skin: No change in the color of the skin, palor , no  Rash  Allergic, immunologic: No environmental allergies , no  food allergies  Neurological: No dizziness no  syncope. No headaches. No diplopia, no slurred, no slurred speech, no motor deficits, no facial  Numbness  Hematological: No enlarged lymph nodes, no easy bruising , no unusual bleedings  Psychiatry: No suicidal ideas, no hallucinations, no beavior problems, no confusion.  No unusual/severe anxiety, no depression   Past Medical History  Diagnosis Date  . GERD (gastroesophageal reflux disease)   . Internal hemorrhoids     s/p banding  . Prediabetes     A1C 6.2 years ago  . 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[place])     h/o  increased prolactin, Dr Cruzita Lederer (previously @ [facility])  . Anxiety and depression     h/o suicidality w/ citalopram  . Hypogonadism male     Dr Cruzita Lederer, used to see urology  . Vitamin B 12 deficiency     h/o  . Migraine     on topamax  . OSA on CPAP   . Vitamin D deficiency   . Difficult intubation   . 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 ETD (eustachian tube dysfunction)     s/p ENT, declined ear tuves before   . History of chicken pox     Titered on 08/16/2005    Past Surgical History  Procedure Laterality Date  . 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 Tonsillectomy and adenoidectomy      Social History   Social History  . Marital Status: Married    Spouse Name: N/A  . Number of Children: 0  . Years of Education: N/A   Occupational History  . 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 Smoking status: Never Smoker   . Smokeless tobacco: Never Used  . Alcohol Use: No  . Drug Use: No  . Sexual Activity: Not on file   Other Topics Concern  . Not on file   Social History Narrative   Lives w/ wife  Family History  Problem Relation Age of Onset  . Prostate cancer Other     GF  . Diabetes Paternal Grandmother   . Hypertension Paternal Grandmother   . Diabetes Paternal Grandfather   . CAD Other     PGF  . Colon cancer Neg Hx        Medication List       This list is accurate as of: 07/26/15 11:59 PM.  Always use your most recent med list.               B COMPLEX-B12 PO  Take by mouth.     CALTRATE 600+D 600-400 MG-UNIT chew tablet  Generic drug:  Calcium Carbonate-Vitamin D  Chew 1 tablet by mouth daily. None Chew     fexofenadine 180 MG tablet  Commonly known as:  ALLEGRA  Take 180 mg by mouth daily.     fluticasone 50 MCG/ACT nasal spray  Commonly known as:  FLONASE  Place 2 sprays into both nostrils daily.     MAG-OXIDE PO  Take 500 mg by mouth daily.     metFORMIN 500 MG 24 hr  tablet  Commonly known as:  GLUCOPHAGE-XR  Take 2 tablets (1,000 mg total) by mouth daily with breakfast.     multivitamin tablet  Take 1 tablet by mouth daily.     pantoprazole 40 MG tablet  Commonly known as:  PROTONIX  Take 1 tablet (40 mg total) by mouth daily.     verapamil 120 MG CR tablet  Commonly known as:  CALAN SR  Take 1 tablet (120 mg total) by mouth at bedtime.     vitamin B-12 250 MCG tablet  Commonly known as:  CYANOCOBALAMIN  Take 500 mcg by mouth daily.           Objective:   Physical Exam BP 122/64 mmHg  Pulse 70  Temp(Src) 98.2 F (36.8 C) (Oral)  Ht 5\' 10"  (1.778 m)  Wt 373 lb 2 oz (169.248 kg)  BMI 53.54 kg/m2  SpO2 97%  General:   Well developed, morbidly obese . NAD.  Neck: No  thyromegaly  HEENT:  Normocephalic . Face symmetric, atraumatic Lungs:  CTA B Normal respiratory effort, no intercostal retractions, no accessory muscle use. Heart: RRR,  no murmur.  +/+++  pretibial edema bilaterally  Abdomen:  Not distended, soft, non-tender. No rebound or rigidity.   Skin: Exposed areas without rash. Not pale. Not jaundice Neurologic:  alert & oriented X3.  Speech normal, gait appropriate for age and unassisted Strength symmetric and appropriate for age.  Psych: Cognition and judgment appear intact.  Cooperative with normal attention span and concentration.  Behavior appropriate. No anxious or depressed appearing.    Assessment & Plan:   Assessment   Prediabetes Anxiety depression (suicidality w/  Citalopram) Morbid obesity Vitamin D and  B12 deficiency Chronic anemia  Endocrinology:Dr Gherghe --Pituitary mass, surgery : Prolactinoma --Hypogonadism hypogonadotropic --Infertility Migraines, d/c Topamax 05-2015 (pt concerned about short term memory), started verapamil OSA on CPAP -- DR Halford Chessman H/o ET  dysfunction, declined ear tubes H/o hemorrhoids s/p  banding H/o abd pain 2012 (w/u CT, [place], a HIDA scan), similar sx 02-2015 H/o   difficulty intubation  PLAN: Prediabetes: Last A1c satisfactory, continue metformin Vitamin deficiencies: Checking a 123456 and folic acid. On oral supplements. Migraine: On April Topamax was discontinued due to patient is concerned about short-term memory, now on verapamil, working great. Edema : Worse since he started verapamil. May be also  related to weight gain. Will DC verapamil if  edema gets worse  Right upper quadrant abdominal pain and diarrhea: quikly resolved, see previous visit RTC 6 months

## 2015-07-26 NOTE — Progress Notes (Signed)
Pre visit review using our clinic review tool, if applicable. No additional management support is needed unless otherwise documented below in the visit note. 

## 2015-07-26 NOTE — Assessment & Plan Note (Addendum)
Tdap 2015 Never had a cscope  Obesity remains his main challenge, we talk about diet again, will let me know if he is interested in taking any FDA approved wt loss  medications. See AVS Mild anemia noted--- labs  Labs: Iron, ferritin, BMP, FLP, B12 and a microalbumin.

## 2015-07-26 NOTE — Patient Instructions (Signed)
GO TO THE LAB : Get the blood work     GO TO THE FRONT DESK Schedule your next appointment for a  routine check up  In addition to continue working on your diet, think about the following medications to weight loss Belviq, Qsymia, Contrave, Saxenda

## 2015-07-27 NOTE — Assessment & Plan Note (Signed)
Prediabetes: Last A1c satisfactory, continue metformin Vitamin deficiencies: Checking a 123456 and folic acid. On oral supplements. Migraine: On April Topamax was discontinued due to patient is concerned about short-term memory, now on verapamil, working great. Edema : Worse since he started verapamil. May be also related to weight gain. Will DC verapamil if  edema gets worse  Right upper quadrant abdominal pain and diarrhea: quikly resolved, see previous visit RTC 6 months

## 2015-08-09 MED FILL — VERAPAMIL ER 120 MG TABLET: 120 | 60 days supply | Qty: 60 | Fill #2

## 2015-08-21 ENCOUNTER — Other Ambulatory Visit: Payer: Self-pay | Admitting: Internal Medicine

## 2015-08-21 MED FILL — METFORMIN HCL ER 500 MG TAB: 500 | 90 days supply | Qty: 180 | Fill #0

## 2015-08-24 DIAGNOSIS — G4733 Obstructive sleep apnea (adult) (pediatric): Secondary | ICD-10-CM | POA: Diagnosis not present

## 2015-08-31 ENCOUNTER — Other Ambulatory Visit: Payer: Self-pay

## 2015-08-31 VITALS — BP 124/80 | HR 82 | Resp 16 | Ht 71.0 in | Wt 372.6 lb

## 2015-08-31 DIAGNOSIS — R7303 Prediabetes: Secondary | ICD-10-CM

## 2015-08-31 NOTE — Patient Instructions (Signed)
1. Plan to eat 45-60 GM (3-4) servings of carbohydrate a meal and 15 GM for snacks.  Plan to eat protein with snacks 2. Plan to continue to follow Weight Watchers and lose 1-2 lbs a week 3. Plan to look into FDS approved drugs Belviq, Qsymia, Contrave, Saxenda 4. Plan to walk 7000 steps a day 5. Plan for next [facility] visit at 12/14/15 at [facility]

## 2015-08-31 NOTE — Patient Outreach (Signed)
Julian Washington) Care Management   08/31/2015  Julian Washington 11-Jul-1980 [id number]  Julian Washington is an 35 y.o. male.   Member seen for follow up office visit for Link to Wellness program for self management of prediabetes  Subjective: Member states that he has restarted Weight Watchers last week.  States that he his trying to watch his portions but he has difficulty when his wife goes off plan.  States he is going to start graduate school in a few weeks and expects he will be having more stress.  States that he saw Dr. Larose Washington last month and he did talk to him about weight loss drugs but he is undecided about them.  States he saw Dr. Cruzita Washington in May and his hemoglobin A1C was down to 5.8%.  States he is not exercising other than when he works he get over 7000 steps on his [employer].    Objective:   Review of Systems  Neurological: Positive for headaches.  All other systems reviewed and are negative.   Physical Exam Today's Vitals   08/31/15 0909  BP: 124/80  Pulse: 82  Resp: 16  Height: 1.803 m (5\' 11" )  Weight: 372 lb 9.6 oz (169.01 kg)  SpO2: 97%  PainSc: 0-No pain   Encounter Medications:   Outpatient Encounter Prescriptions as of 08/31/2015  Medication Sig  . B Complex Vitamins (B COMPLEX-B12 PO) Take by mouth.  . Calcium Carbonate-Vitamin D (CALTRATE 600+D) 600-400 MG-UNIT per chew tablet Chew 1 tablet by mouth daily. None Chew  . fexofenadine (ALLEGRA) 180 MG tablet Take 180 mg by mouth daily.  . fluticasone (FLONASE) 50 MCG/ACT nasal spray Place 2 sprays into both nostrils daily.  . Magnesium Oxide (MAG-OXIDE PO) Take 500 mg by mouth daily.  . metFORMIN (GLUCOPHAGE-XR) 500 MG 24 hr tablet TAKE 2 TABLETS (1,000 MG TOTAL) BY MOUTH DAILY WITH BREAKFAST.  . Multiple Vitamin (MULTIVITAMIN) tablet Take 1 tablet by mouth daily.   . pantoprazole (PROTONIX) 40 MG tablet Take 1 tablet (40 mg total) by mouth daily.  . vitamin B-12 (CYANOCOBALAMIN) 250 MCG tablet Take 500 mcg  by mouth daily.   . verapamil (CALAN SR) 120 MG CR tablet Take 1 tablet (120 mg total) by mouth at bedtime.   No facility-administered encounter medications on file as of 08/31/2015.    Functional Status:   In your present state of health, do you have any difficulty performing the following activities: 08/31/2015 07/26/2015  Hearing? N N  Vision? N N  Difficulty concentrating or making decisions? N N  Walking or climbing stairs? N Y  Dressing or bathing? N N  Doing errands, shopping? N N    Fall/Depression Screening:    PHQ 2/9 Scores 08/31/2015 07/26/2015 04/06/2015 01/03/2015 11/14/2014 10/07/2014  PHQ - 2 Score 0 0 0 0 0 0    Assessment:   Member seen for follow up office visit for Link to Wellness program for self management of prediabetes. Member is meeting self management goal of hemoglobin A1C of 6.4% or less with last reading of 5.8%. Hemoglobin A1C improved from prior reading of 6.1%.   Member has restarted with Weight Watchers. Member is interested in medications to assist with weight loss and plans to research them.  Member is not exercising other than walking over 4000 steps on the weekends. Reports taking medications without difficulty. Member is up to date with annual eye exam and dental check ups  Plan:  Plan to eat 45-60 GM (3-4)  servings of carbohydrate a meal and 15 GM for snacks.  Plan to eat protein with snacks Plan to continue to follow Weight Watchers and lose 1-2 lbs a week Plan to look into FDS approved drugs Belviq, Qsymia, Contrave, Saxenda Plan to walk 7000 steps a day Plan for next Link to Wellness visit at 12/14/15 at [place] [facility]        Most Recent Value   Care Plan Problem One  Potential for elevated blood glucoses as evidenced by hemoglobin A1C of 5.8% related to dx of prediabetes   Role Documenting the Problem One  Care Management Coordinator   Care Plan for Problem One  Active   [facility] Long Term Goal (31-90 days)  Member will keep  hemoglobin A1C 6.4 or below until next Link to Wellness visit 08/31/15   [facility] Long Term Goal Start Date  08/31/15   Interventions for Problem One Long Term Goal  Reviewed CHO counting and portion control, Discussed the use of FDA approved drugs for weight loss and encouraged to discuss with his MD if he decided to try any of them, Contacted Outpatient pharmacy manager to obtain information on insurance coverage on these medications,  Encourged to continue to follow the Weight Watchers program and losing weight, Reinforced importance of regular exercise for glycemic control, Discussed methods to relieve stress other than eating and encouraged to do deep breathing exercises      Peter Garter RN, [facility] Care Management Coordinator-Link to [place] Management [phone]

## 2015-09-28 MED FILL — PANTOPRAZOLE SOD DR 40 MG T: 40 | 90 days supply | Qty: 90 | Fill #3

## 2015-10-09 ENCOUNTER — Other Ambulatory Visit: Payer: Self-pay | Admitting: Internal Medicine

## 2015-10-09 MED FILL — VERAPAMIL ER 120 MG TABLET: 120 | 30 days supply | Qty: 30 | Fill #0

## 2015-11-07 ENCOUNTER — Telehealth: Payer: 59 | Admitting: Family

## 2015-11-07 DIAGNOSIS — L0390 Cellulitis, unspecified: Secondary | ICD-10-CM | POA: Diagnosis not present

## 2015-11-07 MED ORDER — DOXYCYCLINE HYCLATE 100 MG PO TABS: 100.0000 mg | ORAL_TABLET | Freq: Two times a day (BID) | ORAL | 0 refills | Status: DC

## 2015-11-07 NOTE — Progress Notes (Signed)
E Visit for Rash  We are sorry that you are not feeling well. Here is how we plan to help!  It appears that you have a cellulitis of your lower legs.   Doxycycline 100 mg twice per day for 7 days   HOME CARE:   Take cool showers and avoid direct sunlight.  Apply cool compress or wet dressings.  Take a bath in an oatmeal bath.  Sprinkle content of one Aveeno packet under running faucet with comfortably warm water.  Bathe for 15-20 minutes, 1-2 times daily.  Pat dry with a towel. Do not rub the rash.  Use hydrocortisone cream.  Take an antihistamine like Benadryl for widespread rashes that itch.  The adult dose of Benadryl is 25-50 mg by mouth 4 times daily.  Caution:  This type of medication may cause sleepiness.  Do not drink alcohol, drive, or operate dangerous machinery while taking antihistamines.  Do not take these medications if you have prostate enlargement.  Read package instructions thoroughly on all medications that you take.  GET HELP RIGHT AWAY IF:   Symptoms don't go away after treatment.  Severe itching that persists.  If you rash spreads or swells.  If you rash begins to smell.  If it blisters and opens or develops a yellow-brown crust.  You develop a fever.  You have a sore throat.  You become short of breath.  MAKE SURE YOU:  Understand these instructions. Will watch your condition. Will get help right away if you are not doing well or get worse.  Thank you for choosing an e-visit. Your e-visit answers were reviewed by a board certified advanced clinical practitioner to complete your personal care plan. Depending upon the condition, your plan could have included both over the counter or prescription medications. Please review your pharmacy choice. Be sure that the pharmacy you have chosen is open so that you can pick up your prescription now.  If there is a problem you may message your provider in [place] to have the prescription routed to another  pharmacy. Your safety is important to [place]. If you have drug allergies check your prescription carefully.  For the next 24 hours, you can use MyChart to ask questions about today's visit, request a non-urgent call back, or ask for a work or school excuse from your e-visit provider. You will get an email in the next two days asking about your experience. I hope that your e-visit has been valuable and will speed your recovery.

## 2015-11-10 MED FILL — VERAPAMIL ER 120 MG TABLET: 120 | 90 days supply | Qty: 90 | Fill #1

## 2015-11-17 MED FILL — METFORMIN HCL ER 500 MG TAB: 500 | 90 days supply | Qty: 180 | Fill #1 | Status: TO

## 2015-11-28 ENCOUNTER — Encounter: Payer: Self-pay | Admitting: Internal Medicine

## 2015-11-28 ENCOUNTER — Ambulatory Visit (INDEPENDENT_AMBULATORY_CARE_PROVIDER_SITE_OTHER): Payer: 59 | Admitting: Internal Medicine

## 2015-11-28 VITALS — BP 164/86 | HR 79 | Temp 97.8°F | Resp 14 | Ht 70.0 in | Wt 382.0 lb

## 2015-11-28 DIAGNOSIS — R030 Elevated blood-pressure reading, without diagnosis of hypertension: Secondary | ICD-10-CM

## 2015-11-28 DIAGNOSIS — G43809 Other migraine, not intractable, without status migrainosus: Secondary | ICD-10-CM | POA: Diagnosis not present

## 2015-11-28 MED ORDER — VERAPAMIL HCL ER 240 MG PO TBCR: 240.0000 mg | EXTENDED_RELEASE_TABLET | Freq: Every day | ORAL | 1 refills | Status: DC

## 2015-11-28 NOTE — Progress Notes (Signed)
Pre visit review using our clinic review tool, if applicable. No additional management support is needed unless otherwise documented below in the visit note. 

## 2015-11-28 NOTE — Progress Notes (Signed)
Subjective:    Patient ID: Julian Washington, male    DOB: September 13, 1980, 35 y.o.   MRN: [id number]  DOS:  11/28/2015 Type of visit - description : acute Interval history: This morning, he developed a headache, c/w a classic  migraines. He took 4 ibuprofen and later on a Excedrin and the headache gradually decreased. He decided to check his blood pressure just to see, it was 175/96, subsequently it decreased to the 130s. Has not been checking blood pressures regularly. He is on verapamil for headache prevention and is working great, last headache before today was 6 months ago.  Wt Readings from Last 3 Encounters:  11/28/15 (!) 382 lb (173.3 kg)  08/31/15 (!) 372 lb 9.6 oz (169 kg)  07/26/15 (!) 373 lb 2 oz (169.2 kg)   Temp Readings from Last 3 Encounters:  11/28/15 97.8 F (36.6 C) (Oral)  07/26/15 98.2 F (36.8 C) (Oral)  07/06/15 98.2 F (36.8 C) (Oral)   BP Readings from Last 3 Encounters:  11/28/15 (!) 164/86  08/31/15 124/80  07/26/15 122/64   Pulse Readings from Last 3 Encounters:  11/28/15 79  08/31/15 82  07/26/15 70     Review of Systems  No fever chills Some allergies but he is not taking decongestants  Past Medical History:  Diagnosis Date  . Anxiety and depression    h/o suicidality w/ citalopram  . Difficult intubation   . ETD (eustachian tube dysfunction)    s/p ENT, declined ear tuves before   . GERD (gastroesophageal reflux disease)   . History of chicken pox    Titered on 08/16/2005  . Hypogonadism male    Dr Cruzita Lederer, used to see urology  . Internal hemorrhoids    s/p banding  . Migraine    on topamax  . OSA on CPAP   . 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 Prediabetes    A1C 6.2 years ago  . 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 Vitamin B 12 deficiency    h/o  . Vitamin D deficiency     Past Surgical History:  Procedure Laterality Date  . 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 TONSILLECTOMY AND ADENOIDECTOMY      Social History   Social History  . Marital status: Married    Spouse name: N/A  . Number of children: 0  . Years of education: N/A   Occupational History  . RN, [employer]   Social History Main Topics  . Smoking status: Never Smoker  . Smokeless tobacco: Never Used  . Alcohol use No  . Drug use: No  . Sexual activity: Not on file   Other Topics Concern  . Not on file   Social History Narrative   Lives w/ wife        Medication List       Accurate as of 11/28/15  2:21 PM. Always use your most recent med list.          B COMPLEX-B12 PO Take by mouth.   CALTRATE 600+D 600-400 MG-UNIT chew tablet Generic drug:  Calcium Carbonate-Vitamin D Chew 1 tablet by mouth daily. None Chew   doxycycline 100 MG tablet Commonly known as:  VIBRA-TABS Take 1 tablet (100 mg total) by mouth 2 (two) times daily.   fexofenadine 180 MG tablet Commonly known as:  ALLEGRA Take 180 mg  by mouth daily.   fluticasone 50 MCG/ACT nasal spray Commonly known as:  FLONASE Place 2 sprays into both nostrils daily.   MAG-OXIDE PO Take 500 mg by mouth daily.   metFORMIN 500 MG 24 hr tablet Commonly known as:  GLUCOPHAGE-XR TAKE 2 TABLETS (1,000 MG TOTAL) BY MOUTH DAILY WITH BREAKFAST.   multivitamin tablet Take 1 tablet by mouth daily.   pantoprazole 40 MG tablet Commonly known as:  PROTONIX Take 1 tablet (40 mg total) by mouth daily.   verapamil 120 MG CR tablet Commonly known as:  CALAN-SR Take 1 tablet (120 mg total) by mouth at bedtime.   vitamin B-12 250 MCG tablet Commonly known as:  CYANOCOBALAMIN Take 500 mcg by mouth daily.          Objective:   Physical Exam BP (!) 164/86 (BP Location: Right Arm, Patient Position: Sitting, Cuff Size: Normal)   Pulse 79   Temp 97.8 F (36.6 C) (Oral)   Resp 14   Ht 5\' 10"  (1.778 m)   Wt (!) 382 lb (173.3 kg)   SpO2 96%   BMI 54.81 kg/m    General:   Well developed, well nourished . NAD.  HEENT:  Normocephalic . Face symmetric, atraumatic Lungs:  CTA B Normal respiratory effort, no intercostal retractions, no accessory muscle use. Heart: RRR,  no murmur.  No pretibial edema bilaterally  Skin: Not pale. Not jaundice Neurologic:  alert & oriented X3.  Speech normal, gait appropriate for age and unassisted. EOMI, motor symmetric. Psych--  Cognition and judgment appear intact.  Cooperative with normal attention span and concentration.  Behavior appropriate. No anxious or depressed appearing.      Assessment & Plan:   Assessment   Prediabetes Anxiety depression (suicidality w/  Citalopram) Morbid obesity Vitamin D and  B12 deficiency Chronic anemia  Endocrinology:Dr Gherghe --Pituitary mass, surgery : Prolactinoma --Hypogonadism hypogonadotropic --Infertility Migraines, d/c Topamax 05-2015 (pt concerned about short term memory), started verapamil OSA on CPAP -- DR Halford Chessman H/o ET  dysfunction, declined ear tubes H/o hemorrhoids s/p  banding H/o abd pain 2012 (w/u CT, [place], a HIDA scan), similar sx 02-2015 H/o  difficulty intubation  PLAN: Migraine headaches: Well-controlled on verapamil, today had the first episode the more than 6 months. Sx  are now essentially resolved. Will increase verapamil dose, see next. Elevated BP: BP today was elevated, this afternoon is still in the 160s. Will increase verapamil from 120 to  240 mg  to prevent  BP excursions in the 170s. Patient to let me know if side effects such as edema. Recommend to check ambulatory BPs. Obesity: Unable to lose weight, likes to start [facility],  will discuss with patient endocrinologist

## 2015-11-28 NOTE — Patient Instructions (Addendum)
Increase verapamil dose to 240 mg a day  Check the  blood pressure 2 or 3 times a  Week   Be sure your blood pressure is between 110/65 and  145/85.  if it is consistently higher or lower, let me know

## 2015-11-29 NOTE — Assessment & Plan Note (Signed)
Migraine headaches: Well-controlled on verapamil, today had the first episode the more than 6 months. Sx  are now essentially resolved. Will increase verapamil dose, see next. Elevated BP: BP today was elevated, this afternoon is still in the 160s. Will increase verapamil from 120 to  240 mg  to prevent  BP excursions in the 170s. Patient to let me know if side effects such as edema. Recommend to check ambulatory BPs. Obesity: Unable to lose weight, likes to start [facility],  will discuss with patient endocrinologist

## 2015-11-30 ENCOUNTER — Telehealth: Payer: Self-pay | Admitting: Internal Medicine

## 2015-11-30 MED ORDER — GLUCOSE BLOOD VI STRP: ORAL_STRIP | 12 refills | Status: DC

## 2015-11-30 MED ORDER — LIRAGLUTIDE -WEIGHT MANAGEMENT 18 MG/3ML ~~LOC~~ SOPN
3.0000 mg | PEN_INJECTOR | Freq: Every day | SUBCUTANEOUS | 1 refills | Status: DC
Start: 2015-11-30 — End: 2016-05-06

## 2015-11-30 MED ORDER — INSULIN PEN NEEDLE 32G X 6 MM MISC
1 refills | Status: DC
Start: 2015-11-30 — End: 2016-05-06

## 2015-11-30 MED ORDER — LANCETS MISC: 12 refills | Status: DC

## 2015-11-30 MED ORDER — TRUE METRIX METER W/DEVICE KIT: PACK | 0 refills | Status: DC

## 2015-11-30 NOTE — Telephone Encounter (Signed)
PA initiated via Covermymeds; KEY: [id number]. Awaiting determination.

## 2015-11-30 NOTE — Telephone Encounter (Signed)
Advise patient: Discuss with endocrinology, okay to start saxenda, let the patient know. Send a prescription for SAXENDA: 0.6 mg once daily for one week; increase to 1.2 mg qd x 1 week,then 1.8 mg qd x 1 week, then 2.4 mg qd x 1 week, then stay on 3 mg qd  Watch for side effects mostly nausea and stomach problems. Follow-up in 4 weeks. 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

## 2015-11-30 NOTE — Telephone Encounter (Signed)
Spoke w/ Pt, informed him how to take Saxenda. Rx sent to [facility]. Pt requesting blood sugar monitor kit sent to pharmacy as well (okay per PCP). Spoke w/ Pam in pharmacy, [employer] covers True Metrix blood sugar meter. Rx sent. Pt will pick up samples from office. 4 week follow-up scheduled. Awaiting PA information.

## 2015-12-01 NOTE — Telephone Encounter (Signed)
Received PA approval notification. Medication approved through 04/01/2016. Pharmacy informed. Approval notification sent for scanning.

## 2015-12-13 MED FILL — ULTICARE PEN NDL 8MM 31G: 31G X 8 MM | 90 days supply | Qty: 100 | Fill #0

## 2015-12-13 MED FILL — SAXENDA 18 MG/3 ML PEN: 18 | 30 days supply | Qty: 15 | Fill #0

## 2015-12-14 ENCOUNTER — Ambulatory Visit: Payer: Self-pay

## 2015-12-28 ENCOUNTER — Ambulatory Visit: Payer: 59 | Admitting: Internal Medicine

## 2016-01-03 ENCOUNTER — Other Ambulatory Visit: Payer: Self-pay | Admitting: Internal Medicine

## 2016-01-03 MED FILL — VERAPAMIL ER 240 MG TABLET: 240 | 90 days supply | Qty: 90 | Fill #0

## 2016-01-03 MED FILL — PANTOPRAZOLE SOD DR 40 MG T: 40 | 90 days supply | Qty: 90 | Fill #0

## 2016-01-10 ENCOUNTER — Ambulatory Visit (INDEPENDENT_AMBULATORY_CARE_PROVIDER_SITE_OTHER): Payer: 59 | Admitting: Internal Medicine

## 2016-01-10 ENCOUNTER — Encounter: Payer: Self-pay | Admitting: Internal Medicine

## 2016-01-10 VITALS — BP 122/68 | HR 88 | Ht 70.0 in | Wt 372.0 lb

## 2016-01-10 DIAGNOSIS — E291 Testicular hypofunction: Secondary | ICD-10-CM | POA: Diagnosis not present

## 2016-01-10 DIAGNOSIS — Z87898 Personal history of other specified conditions: Secondary | ICD-10-CM

## 2016-01-10 DIAGNOSIS — R7303 Prediabetes: Secondary | ICD-10-CM | POA: Diagnosis not present

## 2016-01-10 DIAGNOSIS — E230 Hypopituitarism: Secondary | ICD-10-CM

## 2016-01-10 LAB — POCT GLYCOSYLATED HEMOGLOBIN (HGB A1C): Hemoglobin A1C: 5.7

## 2016-01-10 LAB — TSH: TSH: 1.27 u[IU]/mL (ref 0.35–4.50)

## 2016-01-10 LAB — CORTISOL: Cortisol, Plasma: 4.8 ug/dL

## 2016-01-10 LAB — T4, FREE: FREE T4: 0.79 ng/dL (ref 0.60–1.60)

## 2016-01-10 LAB — FOLLICLE STIMULATING HORMONE: FSH: 3.7 m[IU]/mL (ref 1.4–18.1)

## 2016-01-10 LAB — LUTEINIZING HORMONE: LH: 2.76 m[IU]/mL (ref 1.50–9.30)

## 2016-01-10 LAB — T3, FREE: T3 FREE: 3.6 pg/mL (ref 2.3–4.2)

## 2016-01-10 NOTE — Progress Notes (Signed)
Patient ID: Julian Washington, male   DOB: 1980-06-28, 35 y.o.   MRN: [id number]  HPI: Julian Washington is a 35 y.o.-year-old man, returning for f/u for hypogonadotropic hypogonadism, history of pituitary adenoma, s/p TSR, and prediabetes. Last visit 6 mo ago.  He started [place] 6 weeks ago. He had some constipation, improved. He lost 10 lbs since he started.  History of pituitary adenoma: The patient has been diagnosed with hypogonadotropic hypogonadism around 2012, after trying to achieve a pregnancy. He tried testosterone replacement and clomiphene (which did not work), a cousin who is a medical resident suggested to have further investigation to find out the cause for his hypogonadism. His urologist at that time ordered a pituitary MRI and he has been found to have a pituitary microadenoma on MRI in 12/2011. He was referred to neurosurgery - Dr Luther Hearing - and he ended up having TSR in 02/2012. Surgical pathology showed:  The sections show monomorphic nests of neoplastic cells with neuroendocrine nuclear features, consistent with a pituitary adenoma. The tumor is strongly and diffusely immunoreactive for prolactin (100% of cells). There is patchy staining for adrenocorticotropic hormone (less than 5% of cells). The cells are negative for TSH, HGH, FSH, and beta-LH.   His highest prolactin level was 44.7 before the surgery. This has decreased after the surgery.  I reviewed patient's pertinent labs per records from [place] [facility] [place] [facility]: 11/2010: TSH 2 02/2011: Testosterone 0.89, free testosterone 2.2 (9.3-26.5) 11/17/2011: TSH 2.35, FSH 2.08, LH 1.44, testosterone 0.08 (2.41-8.27) 12/17/2011: Prolactin 44.7, estradiol 23, cortisol 2.9, hemoglobin A1c 6.2% 02/19/2012: TSH 1.9, total T3 106, free T4 1, hemoglobin A1c 5.8% 03/05/2012: Prolactin 1.4, cortisol 37.2, hemoglobin A1c 5.6%  04/02/2012: Prolactin 3.2, testosterone 79 ([phone]), hemoglobin A1c 6.1%  06/19/2012:  Total testosterone 119 [phone]), free testosterone 23.5 (40-224), SHBG 17 (10-50), LH  3.4, FSH 4.7, ACTH 20, cortisol 6.5, TSH 3.072, free T4 0.9 09/23/2012: Total testosterone 115, free testosterone 20.5, SHBG 20 04/02/2013: Total testosterone 76, free testosterone 15.6, SHBG 15  04/26/2013: TSH 2.34, free T4 0.9  At last visit, we checked his pituitary tests and I returned normal, except low testosterone associated with inappropriately normal LH and FSH. Component     Latest Ref Rng 07/08/2014  Testosterone     300 - 890 ng/dL 106 (L)  Sex Hormone Binding     10 - 50 nmol/L 17  Testosterone Free     47.0 - 244.0 pg/mL 27.2 (L)  Testosterone-% Free     1.6 - 2.9 % 2.6  IGF-I, LC/MS     53 - 331 ng/mL 59  Z-Score (Male)     -2.0-+2.0 SD -1.8  FSH     1.4 - 18.1 mIU/mL 4.1  LH     1.5 - 9.3 mIU/mL 3.1  Cortisol, Plasma      5.1  C206 ACTH     6 - 50 pg/mL 21  Prolactin     2.1 - 17.1 ng/mL 2.4  Estradiol      28.3  Quantitative HCG      0.21  Hemoglobin A1C     4.6 - 6.5 % 5.9  TSH     0.35 - 4.50 uIU/mL 2.71  Free T4     0.60 - 1.60 ng/dL 0.76  PSA     0.10 - 4.00 ng/mL 0.15  T3, Free     2.3 - 4.2 pg/mL 3.3   Pituitary MRI 12/28/2011: Heterogeneous signal intensity lesion centered  in the anterior aspect of the sella measures approximately 1 cm in maximum dimension and demonstrates a fluid hematocrit level with some intrinsic T1 bright/T2 dark signal along its caudal margin consistent with subacute hemorrhage. There is no mass effect on the optic chiasm or evidence of cavernous sinus invasion.  Pituitary MRI 09/2012: Right lateral sellar soft tissue, likely residual pituitary 0.6 x 0.4 x 0.9 cm, homogeneous postcontrast enhancement, abuts medial aspect of the right cavernous ICA. No mass effect.  Pituitary MRI (10/05/2014) - which he had since last visit: No residual tumor.  Hypogonadotropic hypogonadism: Diagnosed when he and his wife were trying to get  pregnant. They are preparing for IVF at [facility] >> wife's diabetes is not under control and he needs to be between 6 and 7% for the procedure. They are seeing reproductive endocrinology: Dr Robbie Louis, MD.  He started to have low T sxs 2011-2012.  He was previously on Testosterone: axillar gel (did not like it as he was always fearing that he can transfer this to his wife) and inj (200 mg 2x a month). On this >> T level normalized, however he cannot do the injections himself. Clomid did not help (3 mo) >> lost capacity to ejaculate and got hot flushes when stopped.   He has decreased libido.  No difficulty obtaining but has pbs maintaining an erection No trauma to testes, testicular irradiation or surgery No h/o of mumps orchitis/h/o autoimmune ds. No h/o cryptorchidism He grew and went through puberty like his peers No shrinking of testes. + small testes (<5 ml) No incomplete/delayed sexual development     No breast discomfort/gynecomastia    No loss of body hair (axillary/pubic)/decreased need for shaving No height loss No abnormal sense of smell  No hot flushes No vision problems No worst HA of his life, but had HA No FH of hypogonadism/infertility  No FH of hemochromatosis or pituitary tumors No excessive weight gain or loss.  No chronic diseases No chronic pain. Not on opiates, does not take steroids.  No more than 2 drinks a day of alcohol at a time, and this is rarely No anabolic steroids use No herbal medicines Not on antidepressants  No AI ds in his family, no FH of MS.  He does not have family history of early cardiac disease. GF with AMI at 34 y/o.   He has been diagnosed with OSA in 2011. He is now on a CPAP machine. He worked night shifts, but now works days.   He also has prediabetes: Last Hba1c: Lab Results  Component Value Date   HGBA1C 5.8 07/06/2015   HGBA1C 6.2 01/25/2015   HGBA1C 5.9 07/08/2014  Prev. 5.9%, prev. 6.2% and 6.3% (see  above)  Last eye exam: no DR (06/23/2015).  He is on Metformin ER 1000 mg at night. Tolerates it well.  Diet: - Breakfast: Peanut butter sandwich, 2 eggs, yogurt and granola - Lunch: Meat and 2 veggies - Dinner: Meat and 2 veggies - Snacks: 1-2  Obesity: He tried Weight Watchers >> reached a plateau - stopped. Now started Saxenda 1.5 mo ago >> lost 10 lbs.  He also has a history of B12 deficiency.  ROS: Constitutional: + weight loss, no fatigue, no subjective hyperthermia/hypothermia Eyes: no blurry vision, no xerophthalmia ENT: no sore throat, no nodules palpated in throat, no dysphagia/odynophagia, no hoarseness Cardiovascular: no CP/SOB/palpitations/+ leg swelling Respiratory: no cough/SOB Gastrointestinal: no N/V/D/C Musculoskeletal: no muscle/joint aches Skin: + Rash on bilateral shins- red-purple Neurological:  no tremors/numbness/tingling/dizziness  I reviewed pt's medications, allergies, PMH, social hx, family hx, and changes were documented in the history of present illness. Otherwise, unchanged from my initial visit note.  Past Medical History:  Diagnosis Date  . Anxiety and depression    h/o suicidality w/ citalopram  . Difficult intubation   . ETD (eustachian tube dysfunction)    s/p ENT, declined ear tuves before   . GERD (gastroesophageal reflux disease)   . History of chicken pox    Titered on 08/16/2005  . Hypogonadism male    Dr Cruzita Lederer, used to see urology  . Internal hemorrhoids    s/p banding  . Migraine    on topamax  . OSA on CPAP   . 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[place])    h/o increased prolactin, Dr Cruzita Lederer (previously @ [facility])  . Prediabetes    A1C 6.2 years ago  . 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 Vitamin B 12 deficiency    h/o  . Vitamin D deficiency    Past Surgical History:  Procedure Laterality Date  . 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 TONSILLECTOMY AND ADENOIDECTOMY     Social History   Social  History  . Marital status: Married    Spouse name: N/A  . Number of children: 0  . Years of education: N/A   Occupational History  . RN, [employer]   Social History Main Topics  . Smoking status: Never Smoker  . Smokeless tobacco: Never Used  . Alcohol use No  . Drug use: No  . Sexual activity: Not on file   Other Topics Concern  . Not on file   Social History Narrative   Lives w/ wife   Current Outpatient Prescriptions on File Prior to Visit  Medication Sig Dispense Refill  . B Complex Vitamins (B COMPLEX-B12 PO) Take by mouth.    . Calcium Carbonate-Vitamin D (CALTRATE 600+D) 600-400 MG-UNIT per chew tablet Chew 1 tablet by mouth daily. None Chew    . fexofenadine (ALLEGRA) 180 MG tablet Take 180 mg by mouth daily.    . fluticasone (FLONASE) 50 MCG/ACT nasal spray Place 2 sprays into both nostrils daily.    . Insulin Pen Needle 32G X 6 MM MISC Use w/ Saxenda 100 each 1  . Lancets MISC Check blood sugar no more than twice daily 100 each 12  . Liraglutide -Weight Management (SAXENDA) 18 MG/3ML SOPN Inject 3 mg into the skin daily. Use dosing schedule per MD. 5 pen 1  . Magnesium Oxide (MAG-OXIDE PO) Take 500 mg by mouth daily.    . metFORMIN (GLUCOPHAGE-XR) 500 MG 24 hr tablet TAKE 2 TABLETS (1,000 MG TOTAL) BY MOUTH DAILY WITH BREAKFAST. 180 tablet 2  . Multiple Vitamin (MULTIVITAMIN) tablet Take 1 tablet by mouth daily.     . pantoprazole (PROTONIX) 40 MG tablet Take 1 tablet (40 mg total) by mouth daily. 90 tablet 3  . verapamil (CALAN SR) 240 MG CR tablet Take 1 tablet (240 mg total) by mouth at bedtime. 90 tablet 1  . vitamin B-12 (CYANOCOBALAMIN) 250 MCG tablet Take 500 mcg by mouth daily.     . Blood Glucose Monitoring Suppl (TRUE METRIX METER) w/Device KIT Check blood sugar no more than twice daily (Patient not taking: Reported on 01/10/2016) 1 kit 0  . glucose blood (TRUE METRIX BLOOD GLUCOSE TEST) test strip Check blood sugar no more than twice daily  (  Patient not taking: Reported on 01/10/2016) 100 each 12   No current facility-administered medications on file prior to visit.    Allergies  Allergen Reactions  . Levaquin [Levofloxacin In D5w] Swelling  . Celexa [Citalopram] Other (See Comments)    Mental status changes   . Dextrans Other (See Comments)    Makes the pt. Drowsy.   . Hydrocodone-Acetaminophen     REACTION: itching  . Latex   . Oxycodone Itching   Family History  Problem Relation Age of Onset  . Diabetes Paternal Grandmother   . Hypertension Paternal Grandmother   . Diabetes Paternal Grandfather   . Prostate cancer Other     GF  . CAD Other     PGF  . Colon cancer Neg Hx     PE: BP 122/68   Pulse 88   Ht _0  (1.778 m)   Wt (!) 372 lb (168.7 kg)   SpO2 96%   BMI 53.38 kg/m  Body mass index is 53.38 kg/m. Wt Readings from Last 3 Encounters:  01/10/16 (!) 372 lb (168.7 kg)  11/28/15 (!) 382 lb (173.3 kg)  08/31/15 (!) 372 lb 9.6 oz (169 kg)   Constitutional: Obese, in NAD Eyes: PERRLA, EOMI, no exophthalmos ENT: moist mucous membranes, no thyromegaly, no cervical lymphadenopathy Cardiovascular: RRR, No MRG, + Bilateral leg swelling Respiratory: CTA B Gastrointestinal: abdomen soft, NT, ND, BS+ Musculoskeletal: no deformities, strength intact in all 4 Skin: moist, warm, + Rash on bilateral shins - ? necrobiosis lipoidica diabeticorum Neurological: no tremor with outstretched hands, DTR normal in all 4  ASSESSMENT: 1. H/o Pituitary adenoma (prolactinoma) with pituitary apoplexy - status post TSR 02/2012  2. Hypogonadotropic hypogonadism  3. Prediabetes  4. Obesity  PLAN:  1. History of pituitary adenoma - Patient had a 1 cm pituitary adenoma, which was resected through TSR, with the staining revealing a prolactinoma - His Pituitary labs were normal in 06/2014, except for low testosterone and inappropriately normal LH and FSH. Will recheck them today. - MRI follow-up is performed by Dr.  Salomon Fick (neurosurgery). I reviewed the last MRI report from 2016 along with the patient. This shows no recurrence of the tumor.   2. Hypogonadotropic hypogonadism  - Patient has had a long history of hypogonadotropic hypogonadism, with fairly poor response to treatment based on the available testosterone levels - We again discussed about treatment of his hypogonadism, which consists of testosterone replacement especially since he did not benefit from Clomid in the past. However, as he and his wife are trying for a pregnancy, will hold off testosterone treatment. For now, since his wife's DM is still out of control, IVF is on hold.   3. Prediabetes - Reviewed last hemoglobin A1c, which was 5.8% - We repeated the HbA1c today >> 5.7%  - Continue metformin ER 1000 mg at night - Weight loss will greatly help  4. Obesity  - started Saxenda  Component     Latest Ref Rng & Units 01/10/2016  Testosterone     264 - 916 ng/dL 29 (L)  Sex Horm Binding Glob, Serum     16.5 - 55.9 nmol/L 22.8  Testosterone Free     8.7 - 25.1 pg/mL 1.6 (L)  IGF-I, LC/MS     53 - 331 ng/mL 61  Z-Score (Male)     -2.0 - 2.0 SD -1.7  FSH     1.4 - 18.1 mIU/ML 3.7  LH     1.50 - 9.30 mIU/mL 2.76  Cortisol, Plasma     ug/dL 4.8  C206 ACTH     6 - 50 pg/mL 18  Prolactin     2.0 - 18.0 ng/mL 4.2  Hemoglobin A1C      5.7  TSH     0.35 - 4.50 uIU/mL 1.27  T4,Free(Direct)     0.60 - 1.60 ng/dL 0.79  Triiodothyronine,Free,Serum     2.3 - 4.2 pg/mL 3.6  Labs are normal except very low testosterone level. Since we cannot use testosterone, I will check with him if he likes to try Clomid again.  Philemon Kingdom, MD PhD [facility] Endocrinology

## 2016-01-10 NOTE — Patient Instructions (Addendum)
Please stop at the lab.  Continue Metformin ER 1000 mg once a day with dinner.  Check sugars once a week.  Please return in 1 year.

## 2016-01-11 LAB — TESTOSTERONE, FREE, TOTAL, SHBG
SEX HORMONE BINDING: 22.8 nmol/L (ref 16.5–55.9)
TESTOSTERONE FREE: 1.6 pg/mL — AB (ref 8.7–25.1)
Testosterone: 29 ng/dL — ABNORMAL LOW (ref 264–916)

## 2016-01-11 LAB — PROLACTIN: Prolactin: 4.2 ng/mL (ref 2.0–18.0)

## 2016-01-12 ENCOUNTER — Ambulatory Visit: Payer: 59 | Admitting: Pulmonary Disease

## 2016-01-12 LAB — ACTH: C206 ACTH: 18 pg/mL (ref 6–50)

## 2016-01-15 LAB — INSULIN-LIKE GROWTH FACTOR
IGF-I, LC/MS: 61 ng/mL (ref 53–331)
Z-Score (Male): -1.7 SD (ref ?–2.0)

## 2016-01-16 DIAGNOSIS — Z3189 Encounter for other procreative management: Secondary | ICD-10-CM | POA: Diagnosis not present

## 2016-01-17 ENCOUNTER — Other Ambulatory Visit: Payer: Self-pay

## 2016-01-17 VITALS — BP 122/80 | HR 85 | Resp 16 | Ht 71.0 in | Wt 373.4 lb

## 2016-01-17 DIAGNOSIS — R7303 Prediabetes: Secondary | ICD-10-CM

## 2016-01-17 MED FILL — SAXENDA 18 MG/3 ML PEN: 18 | 30 days supply | Qty: 15 | Fill #1

## 2016-01-17 NOTE — Patient Instructions (Signed)
1. Plan to eat 45-60 GM (3-4) servings of carbohydrate a meal and 15 GM for snacks.  Plan to eat protein with snacks 2. Plan to continue to follow Weight Watchers and lose 1-2 lbs a week 3. Plan to walk 3000 steps a day on days that you do not work Plan to be followed by the [employer]

## 2016-01-17 NOTE — Patient Outreach (Signed)
[facility]) Care Management   01/17/2016  Julian Washington 28-Aug-1980 [id number]  Julian Washington is an 35 y.o. male  Member seen for follow up office visit for Link to Wellness program for self management of Prediabetes  Subjective: Member states that he has started [place] and it has helped with his appetite.  States that he has lost 10 lbs since starting 6 weeks ago.  States he saw Dr. Cruzita Lederer on 11/29 and his hemoglobin A1C was 5.7%.  States does not feel like eating as much and he feels full with a smaller amount of food.  States that he is not exercising regularly but he does get over 10,000 steps when he works.  states that he is studying  for school on his days off and he does not get as many steps.  Objective:   Review of Systems  All other systems reviewed and are negative.   Physical Exam Today's Vitals   01/17/16 1024 01/17/16 1032  BP: 122/80   Pulse: 85   Resp: 16   SpO2: 97%   Weight: (!) 373 lb 6.4 oz (169.4 kg)   Height: 1.803 m (_0 )   PainSc: 0-No pain 0-No pain   Encounter Medications:   Outpatient Encounter Prescriptions as of 01/17/2016  Medication Sig  . B Complex Vitamins (B COMPLEX-B12 PO) Take by mouth.  . Calcium Carbonate-Vitamin D (CALTRATE 600+D) 600-400 MG-UNIT per chew tablet Chew 1 tablet by mouth daily. None Chew  . fexofenadine (ALLEGRA) 180 MG tablet Take 180 mg by mouth daily.  . fluticasone (FLONASE) 50 MCG/ACT nasal spray Place 2 sprays into both nostrils daily.  . Insulin Pen Needle 32G X 6 MM MISC Use w/ Saxenda  . Liraglutide -Weight Management (SAXENDA) 18 MG/3ML SOPN Inject 3 mg into the skin daily. Use dosing schedule per MD.  . Magnesium Oxide (MAG-OXIDE PO) Take 500 mg by mouth daily.  . metFORMIN (GLUCOPHAGE-XR) 500 MG 24 hr tablet TAKE 2 TABLETS (1,000 MG TOTAL) BY MOUTH DAILY WITH BREAKFAST. (Patient taking differently: TAKE 2 TABLETS (1,000 MG TOTAL) BY MOUTH DAILY)  . Multiple Vitamin (MULTIVITAMIN) tablet Take 1  tablet by mouth daily.   . pantoprazole (PROTONIX) 40 MG tablet Take 1 tablet (40 mg total) by mouth daily.  . verapamil (CALAN SR) 240 MG CR tablet Take 1 tablet (240 mg total) by mouth at bedtime.  . vitamin B-12 (CYANOCOBALAMIN) 250 MCG tablet Take 500 mcg by mouth daily.   . Blood Glucose Monitoring Suppl (TRUE METRIX METER) w/Device KIT Check blood sugar no more than twice daily (Patient not taking: Reported on 01/17/2016)  . glucose blood (TRUE METRIX BLOOD GLUCOSE TEST) test strip Check blood sugar no more than twice daily (Patient not taking: Reported on 01/17/2016)  . Lancets MISC Check blood sugar no more than twice daily (Patient not taking: Reported on 01/17/2016)   No facility-administered encounter medications on file as of 01/17/2016.     Functional Status:   In your present state of health, do you have any difficulty performing the following activities: 01/17/2016 08/31/2015  Hearing? N N  Vision? N N  Difficulty concentrating or making decisions? N N  Walking or climbing stairs? N N  Dressing or bathing? N N  Doing errands, shopping? N N  Some recent data might be hidden    Fall/Depression Screening:    PHQ 2/9 Scores 01/17/2016 08/31/2015 07/26/2015 04/06/2015 01/03/2015 11/14/2014 10/07/2014  PHQ - 2 Score 0 0 0 0 0 0  0    Assessment:  Member seen for follow up office visit for Link to Wellness program for self management of prediabetes. Member is meeting self management goal of hemoglobin A1C of 6.4% or less with last reading of 5.7%. Hemoglobin A1C improved from prior reading of 5.8%.   Member has started taking Saxenda for weight loss and reports a loss of 10 lbs since starting medication.   Member is not exercising other than walking over 10,000 steps a day when working. Reports taking medications without difficulty. Member is up to date with annual eye exam and dental check ups.  Member has enrolled in the [place] program  Plan:  1. Plan to eat 45-60 GM (3-4) servings  of carbohydrate a meal and 15 GM for snacks.  Plan to eat protein with snacks 2. Plan to continue to follow Weight Watchers and lose 1-2 lbs a week 3. Plan to walk 3000 steps a day on days that you do not work Plan to be followed by the [employer] program  [facility] CM Care Plan Problem One   Flowsheet Row Most Recent Value  Care Plan Problem One  Potential for elevated blood glucoses as evidenced by hemoglobin A1C of 5.8% related to dx of prediabetes  Role Documenting the Problem One  Care Management Coordinator  Care Plan for Problem One  Active  [facility] Long Term Goal (31-90 days)  Member will keep hemoglobin A1C 6.4 or below until next Link to Wellness visit 08/31/15  [facility] Long Term Goal Start Date  01/17/16  Interventions for Problem One Long Term Goal  Reviewed CHO counting and portion control,  Encourged to continue to follow the Weight Watchers program and losing weight, Reinforced importance of regular exercise for glycemic control, Instructed that he will be followed in the [facility] program next year as he has enrolled in the program      Peter Garter RN, [facility] Care Management Coordinator-Link to [place] Management [phone]

## 2016-01-19 ENCOUNTER — Ambulatory Visit (INDEPENDENT_AMBULATORY_CARE_PROVIDER_SITE_OTHER): Payer: 59 | Admitting: Internal Medicine

## 2016-01-19 ENCOUNTER — Encounter: Payer: Self-pay | Admitting: Internal Medicine

## 2016-01-19 DIAGNOSIS — R739 Hyperglycemia, unspecified: Secondary | ICD-10-CM

## 2016-01-19 LAB — BASIC METABOLIC PANEL
BUN: 13 mg/dL (ref 7–25)
CHLORIDE: 107 mmol/L (ref 98–110)
CO2: 27 mmol/L (ref 20–31)
CREATININE: 0.73 mg/dL (ref 0.60–1.35)
Calcium: 8.8 mg/dL (ref 8.6–10.3)
Glucose, Bld: 90 mg/dL (ref 65–99)
POTASSIUM: 4.3 mmol/L (ref 3.5–5.3)
SODIUM: 139 mmol/L (ref 135–146)

## 2016-01-19 NOTE — Patient Instructions (Signed)
GO TO THE LAB : Get the blood work     GO TO THE FRONT DESK Schedule your next appointment for a checkup in 4 weeks    

## 2016-01-19 NOTE — Progress Notes (Signed)
Pre visit review using our clinic review tool, if applicable. No additional management support is needed unless otherwise documented below in the visit note. 

## 2016-01-19 NOTE — Progress Notes (Signed)
Subjective:    Patient ID: Julian Washington, male    DOB: 1981-02-09, 35 y.o.   MRN: [id number]  DOS:  01/19/2016 Type of visit - description : Follow-up Interval history: Started Saxenda few weeks ago. No major side effects except for constipation a couple of times. His his observation that has decrease his appetite significantly. Per his own scales he has lost 12 pounds, per our scales 7 pounds.    Wt Readings from Last 3 Encounters:  01/19/16 (!) 375 lb 2 oz (170.2 kg)  01/17/16 (!) 373 lb 6.4 oz (169.4 kg)  01/10/16 (!) 372 lb (168.7 kg)     Review of Systems   Past Medical History:  Diagnosis Date  . Anxiety and depression    h/o suicidality w/ citalopram  . Difficult intubation   . ETD (eustachian tube dysfunction)    s/p ENT, declined ear tuves before   . GERD (gastroesophageal reflux disease)   . History of chicken pox    Titered on 08/16/2005  . Hypogonadism male    Dr Cruzita Lederer, used to see urology  . Internal hemorrhoids    s/p banding  . Migraine    on topamax  . OSA on CPAP   . 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[place])    h/o increased prolactin, Dr Cruzita Lederer (previously @ [facility])  . Prediabetes    A1C 6.2 years ago  . 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 Vitamin B 12 deficiency    h/o  . Vitamin D deficiency     Past Surgical History:  Procedure Laterality Date  . 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 TONSILLECTOMY AND ADENOIDECTOMY      Social History   Social History  . Marital status: Married    Spouse name: N/A  . Number of children: 0  . Years of education: N/A   Occupational History  . RN, [employer]   Social History Main Topics  . Smoking status: Never Smoker  . Smokeless tobacco: Never Used  . Alcohol use No  . Drug use: No  . Sexual activity: Not on file   Other Topics Concern  . Not on file   Social History Narrative   Lives w/ wife        Medication List       Accurate as of  01/19/16  4:09 PM. Always use your most recent med list.          B COMPLEX-B12 PO Take by mouth.   CALTRATE 600+D 600-400 MG-UNIT chew tablet Generic drug:  Calcium Carbonate-Vitamin D Chew 1 tablet by mouth daily. None Chew   fexofenadine 180 MG tablet Commonly known as:  ALLEGRA Take 180 mg by mouth daily.   fluticasone 50 MCG/ACT nasal spray Commonly known as:  FLONASE Place 2 sprays into both nostrils daily.   glucose blood test strip Commonly known as:  TRUE METRIX BLOOD GLUCOSE TEST Check blood sugar no more than twice daily   Insulin Pen Needle 32G X 6 MM Misc Use w/ Saxenda   Lancets Misc Check blood sugar no more than twice daily   Liraglutide -Weight Management 18 MG/3ML Sopn Commonly known as:  SAXENDA Inject 3 mg into the skin daily. Use dosing schedule per MD.   MAG-OXIDE PO Take 500 mg by mouth daily.   metFORMIN 500 MG 24 hr tablet Commonly known as:  GLUCOPHAGE-XR TAKE 2 TABLETS (1,000 MG TOTAL) BY MOUTH DAILY  WITH BREAKFAST.   multivitamin tablet Take 1 tablet by mouth daily.   pantoprazole 40 MG tablet Commonly known as:  PROTONIX Take 1 tablet (40 mg total) by mouth daily.   TRUE METRIX METER w/Device Kit Check blood sugar no more than twice daily   verapamil 240 MG CR tablet Commonly known as:  CALAN SR Take 1 tablet (240 mg total) by mouth at bedtime.   vitamin B-12 250 MCG tablet Commonly known as:  CYANOCOBALAMIN Take 500 mcg by mouth daily.          Objective:   Physical Exam BP 132/78 (BP Location: Left Arm, Patient Position: Sitting, Cuff Size: Normal)   Pulse 78   Temp 98.1 F (36.7 C) (Oral)   Resp 14   Ht '5\' 10"'  (1.778 m)   Wt (!) 375 lb 2 oz (170.2 kg)   SpO2 96%   BMI 53.82 kg/m  General:   Well developed,   NAD.  HEENT:  Normocephalic . Face symmetric, atraumatic Skin: Not pale. Not jaundice Neurologic:  alert & oriented X3.  Speech normal, gait appropriate for age and unassisted Psych--  Cognition and  judgment appear intact.  Cooperative with normal attention span and concentration.  Behavior appropriate. No anxious or depressed appearing.      Assessment & Plan:   Assessment   Prediabetes Anxiety depression (suicidality w/  Citalopram) Morbid obesity Vitamin D and  B12 deficiency Chronic anemia  Endocrinology:Dr Gherghe --Pituitary mass, surgery : Prolactinoma --Hypogonadism hypogonadotropic --Infertility Migraines, d/c Topamax 05-2015 (pt concerned about short term memory), started verapamil OSA on CPAP -- DR Halford Chessman H/o ET  dysfunction, declined ear tubes H/o hemorrhoids s/p  banding H/o abd pain 2012 (w/u CT, [place], a HIDA scan), similar sx 02-2015 H/o  difficulty intubation   PLAN: Morbid obesity: Started saxenda ~ 6 weeks ago, has lost 7 pounds per our scales, his goal is around 13 pounds in 16 weeks. Encourage a more strict diet, calorie counting is recommended. Prediabetes: Last A1c excellent, check a BMP. RTC 4 weeks.  Today, I spent more than  17  min with the patient: >50% of the time counseling regards diet-calorie counting

## 2016-01-20 NOTE — Assessment & Plan Note (Signed)
Morbid obesity: Started saxenda ~ 6 weeks ago, has lost 7 pounds per our scales, his goal is around 13 pounds in 16 weeks. Encourage a more strict diet, calorie counting is recommended. Prediabetes: Last A1c excellent, check a BMP. RTC 4 weeks.

## 2016-01-24 ENCOUNTER — Ambulatory Visit: Payer: 59 | Admitting: Internal Medicine

## 2016-01-29 ENCOUNTER — Telehealth: Payer: 59 | Admitting: Family

## 2016-01-29 DIAGNOSIS — B9689 Other specified bacterial agents as the cause of diseases classified elsewhere: Secondary | ICD-10-CM | POA: Diagnosis not present

## 2016-01-29 DIAGNOSIS — J329 Chronic sinusitis, unspecified: Secondary | ICD-10-CM | POA: Diagnosis not present

## 2016-01-29 MED ORDER — AMOXICILLIN-POT CLAVULANATE 875-125 MG PO TABS: 1.0000 | ORAL_TABLET | Freq: Two times a day (BID) | ORAL | 0 refills | Status: AC

## 2016-01-29 MED FILL — AMOX-CLAV 875-125 MG TABLET: 875-125 | 7 days supply | Qty: 14 | Fill #0

## 2016-01-29 NOTE — Progress Notes (Signed)
We are sorry that you are not feeling well.  Here is how we plan to help!  Based on what you have shared with me it looks like you have sinusitis.  Sinusitis is inflammation and infection in the sinus cavities of the head.  Based on your presentation I believe you most likely have Acute Bacterial Sinusitis.  This is an infection caused by bacteria and is treated with antibiotics. I have prescribed Augmentin 875mg /125mg  one tablet twice daily with food, for 7 days. You may use an oral decongestant such as Mucinex D or if you have glaucoma or high blood pressure use plain Mucinex. Saline nasal spray help and can safely be used as often as needed for congestion.  If you develop worsening sinus pain, fever or notice severe headache and vision changes, or if symptoms are not better after completion of antibiotic, please schedule an appointment with a health care provider.    As far as the ear drops, the Augmentin has been show to be more effective. Please use only one antibiotic at a time (we recommend the Augmentin due to good tissue perfusion) and it would make your ear drops redundant and possibly cause other problems for you. You can use the Afrin up to 3 days, maybe 4...after that there is the risk of rebound congestion. After 3-4 days on Afrin, you can certainly use Flonase (over-the-counter) and/or saline nasal spray alternating and that can help as well.   Sinus infections are not as easily transmitted as other respiratory infection, however we still recommend that you avoid close contact with loved ones, especially the very young and elderly.  Remember to wash your hands thoroughly throughout the day as this is the number one way to prevent the spread of infection!  Home Care:  Only take medications as instructed by your medical team.  Complete the entire course of an antibiotic.  Do not take these medications with alcohol.  A steam or ultrasonic humidifier can help congestion.  You can place a  towel over your head and breathe in the steam from hot water coming from a faucet.  Avoid close contacts especially the very young and the elderly.  Cover your mouth when you cough or sneeze.  Always remember to wash your hands.  Get Help Right Away If:  You develop worsening fever or sinus pain.  You develop a severe head ache or visual changes.  Your symptoms persist after you have completed your treatment plan.  Make sure you  Understand these instructions.  Will watch your condition.  Will get help right away if you are not doing well or get worse.  Your e-visit answers were reviewed by a board certified advanced clinical practitioner to complete your personal care plan.  Depending on the condition, your plan could have included both over the counter or prescription medications.  If there is a problem please reply  once you have received a response from your provider.  Your safety is important to [place].  If you have drug allergies check your prescription carefully.    You can use MyChart to ask questions about today's visit, request a non-urgent call back, or ask for a work or school excuse for 24 hours related to this e-Visit. If it has been greater than 24 hours you will need to follow up with your provider, or enter a new e-Visit to address those concerns.  You will get an e-mail in the next two days asking about your experience.  I hope  that your e-visit has been valuable and will speed your recovery. Thank you for using e-visits.

## 2016-02-05 ENCOUNTER — Emergency Department (HOSPITAL_BASED_OUTPATIENT_CLINIC_OR_DEPARTMENT_OTHER)
Admission: EM | Admit: 2016-02-05 | Discharge: 2016-02-05 | Disposition: A | Discharge status: Home / Self Care | Payer: 59 | Attending: Emergency Medicine | Admitting: Emergency Medicine

## 2016-02-05 ENCOUNTER — Encounter (HOSPITAL_BASED_OUTPATIENT_CLINIC_OR_DEPARTMENT_OTHER): Payer: Self-pay

## 2016-02-05 ENCOUNTER — Emergency Department (HOSPITAL_BASED_OUTPATIENT_CLINIC_OR_DEPARTMENT_OTHER): Payer: 59

## 2016-02-05 DIAGNOSIS — R000 Tachycardia, unspecified: Secondary | ICD-10-CM | POA: Diagnosis not present

## 2016-02-05 DIAGNOSIS — Z794 Long term (current) use of insulin: Secondary | ICD-10-CM | POA: Insufficient documentation

## 2016-02-05 DIAGNOSIS — R002 Palpitations: Secondary | ICD-10-CM | POA: Diagnosis not present

## 2016-02-05 DIAGNOSIS — Z91040 Latex allergy status: Secondary | ICD-10-CM | POA: Diagnosis not present

## 2016-02-05 DIAGNOSIS — Z79899 Other long term (current) drug therapy: Secondary | ICD-10-CM | POA: Diagnosis not present

## 2016-02-05 DIAGNOSIS — R0602 Shortness of breath: Secondary | ICD-10-CM | POA: Insufficient documentation

## 2016-02-05 LAB — CBC
HCT: 36.5 % — ABNORMAL LOW (ref 39.0–52.0)
HEMOGLOBIN: 12.2 g/dL — AB (ref 13.0–17.0)
MCH: 27.4 pg (ref 26.0–34.0)
MCHC: 33.4 g/dL (ref 30.0–36.0)
MCV: 82 fL (ref 78.0–100.0)
Platelets: 163 10*3/uL (ref 150–400)
RBC: 4.45 MIL/uL (ref 4.22–5.81)
RDW: 13.8 % (ref 11.5–15.5)
WBC: 8 10*3/uL (ref 4.0–10.5)

## 2016-02-05 LAB — BASIC METABOLIC PANEL
ANION GAP: 7 (ref 5–15)
BUN: 16 mg/dL (ref 6–20)
CHLORIDE: 109 mmol/L (ref 101–111)
CO2: 23 mmol/L (ref 22–32)
Calcium: 8.8 mg/dL — ABNORMAL LOW (ref 8.9–10.3)
Creatinine, Ser: 0.9 mg/dL (ref 0.61–1.24)
GFR calc Af Amer: >60 mL/min (ref >60)
GFR calc non Af Amer: >60 mL/min (ref >60)
Glucose, Bld: 146 mg/dL — ABNORMAL HIGH (ref 65–99)
POTASSIUM: 4.3 mmol/L (ref 3.5–5.1)
SODIUM: 139 mmol/L (ref 135–145)

## 2016-02-05 LAB — TROPONIN I: Troponin I: <0.03 ng/mL (ref <0.03)

## 2016-02-05 LAB — MAGNESIUM: MAGNESIUM: 1.9 mg/dL (ref 1.7–2.4)

## 2016-02-05 NOTE — ED Provider Notes (Signed)
[facility] Provider Note   CSN: [id number] Arrival date & time: 02/05/16  2694  By signing my name below, I, Dolores Hoose, attest that this documentation has been prepared under the direction and in the presence of Veryl Speak, MD . Electronically Signed: Dolores Hoose, Scribe. 02/05/2016. 4:44 PM.  History   Chief Complaint Chief Complaint  Patient presents with  . Palpitations   The history is provided by the patient. No language interpreter was used.    HPI Comments:  Julian Washington is a 35 y.o. male with pmhx of hypogonadotropic hypogonadism, pituitary mass and pre-diabetes who presents to the Emergency Department complaining of intermittent worsening heart palpitations beginning one week ago. He describes his symptoms as an uncomfortable sensation of his heart racing that lasts about 6 seconds at a time. 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 He has been taking Suxion/Victosa for weight loss and tried discontinuing taking this medication to alleviate his symptoms with no relief. He reports associated SOB. No modifying factors. Pt denies any CP, abdominal pain or any other symptoms. 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  Pt still drinks caffeine. No other symptoms noted.   Past Medical History:  Diagnosis Date  . Anxiety and depression    h/o suicidality w/ citalopram  . Difficult intubation   . ETD (eustachian tube dysfunction)    s/p ENT, declined ear tuves before   . GERD (gastroesophageal reflux disease)   . History of chicken pox    Titered on 08/16/2005  . Hypogonadism male    Dr Cruzita Lederer, used to see urology  . Internal hemorrhoids    s/p banding  . Migraine    on topamax  . OSA on CPAP   . 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[place])    h/o increased prolactin, Dr Cruzita Lederer (previously @ [facility])  . Prediabetes    A1C 6.2 years ago  .  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 Vitamin B 12 deficiency    h/o  . Vitamin D deficiency     Patient Active Problem List   Diagnosis Date Noted  . Severe obesity (BMI >= 40) ([place]) 11/29/2015  . PCP NOTES >>> 11/15/2014  . H/O: pituitary tumor 01/28/2014  . Annual physical exam 07/13/2013  . Prediabetes   . Anxiety and depression   . Vitamin B 12 deficiency   . 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. Migraine   . OSA on CPAP   . Vitamin D deficiency   . Internal hemorrhoids   . Absolute anemia 02/27/2012  . ETD (eustachian tube dysfunction) 12/31/2009  . GERD 06/02/2009    Past Surgical History:  Procedure Laterality Date  . 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 TONSILLECTOMY AND ADENOIDECTOMY         Home Medications    Prior to Admission medications   Medication Sig Start Date End Date Taking? Authorizing Provider  amoxicillin-clavulanate (AUGMENTIN) 875-125 MG tablet Take 1 tablet by mouth every 12 (twelve) hours. 01/29/16 02/05/16  Benjamine Mola, FNP  B Complex Vitamins (B COMPLEX-B12 PO) Take by mouth.    Historical Provider, MD  Blood Glucose Monitoring Suppl (TRUE METRIX METER) w/Device KIT Check blood sugar no more than twice daily Patient not taking: Reported on 01/19/2016 11/30/15   Colon Branch,  MD  Calcium Carbonate-Vitamin D (CALTRATE 600+D) 600-400 MG-UNIT per chew tablet Chew 1 tablet by mouth daily. None Chew    Historical Provider, MD  fexofenadine (ALLEGRA) 180 MG tablet Take 180 mg by mouth daily.    Historical Provider, MD  fluticasone (FLONASE) 50 MCG/ACT nasal spray Place 2 sprays into both nostrils daily.    Historical Provider, MD  glucose blood (TRUE METRIX BLOOD GLUCOSE TEST) test strip Check blood sugar no more than twice daily Patient not taking: Reported on 01/19/2016 11/30/15   Colon Branch, MD  Insulin Pen Needle 32G X 6 MM MISC Use w/ Saxenda Patient not taking: Reported on 01/19/2016 11/30/15   Colon Branch, MD  Lancets MISC Check blood sugar no more than twice daily Patient not taking: Reported on 01/19/2016 11/30/15   Colon Branch, MD  Liraglutide -Weight Management (SAXENDA) 18 MG/3ML SOPN Inject 3 mg into the skin daily. Use dosing schedule per MD. 11/30/15   Colon Branch, MD  Magnesium Oxide (MAG-OXIDE PO) Take 500 mg by mouth daily.    Historical Provider, MD  metFORMIN (GLUCOPHAGE-XR) 500 MG 24 hr tablet TAKE 2 TABLETS (1,000 MG TOTAL) BY MOUTH DAILY WITH BREAKFAST. Patient taking differently: TAKE 2 TABLETS (1,000 MG TOTAL) BY MOUTH DAILY 08/21/15   Philemon Kingdom, MD  Multiple Vitamin (MULTIVITAMIN) tablet Take 1 tablet by mouth daily.     Historical Provider, MD  pantoprazole (PROTONIX) 40 MG tablet Take 1 tablet (40 mg total) by mouth daily. 01/03/16   Colon Branch, MD  verapamil (CALAN SR) 240 MG CR tablet Take 1 tablet (240 mg total) by mouth at bedtime. 11/28/15   Colon Branch, MD  vitamin B-12 (CYANOCOBALAMIN) 250 MCG tablet Take 500 mcg by mouth daily.     Historical Provider, MD    Family History Family History  Problem Relation Age of Onset  . Diabetes Paternal Grandmother   . Hypertension Paternal Grandmother   . Diabetes Paternal Grandfather   . Prostate cancer Other     GF  . CAD Other     PGF  . Colon cancer Neg Hx     Social History Social History  Substance Use Topics  . Smoking status: Never Smoker  . Smokeless tobacco: Never Used  . Alcohol use 0.0 oz/week     Comment: occ     Allergies   Levaquin [levofloxacin in d5w]; Celexa [citalopram]; Dextrans; Hydrocodone-acetaminophen; Latex; and Oxycodone   Review of Systems Review of Systems 10 Systems reviewed and are negative for acute change except as noted in the HPI.  Physical Exam Updated Vital Signs BP 163/95 (BP Location: Left Arm)   Pulse 97   Temp 97.7 F (36.5 C) (Oral)   Resp 20   Ht _0  (1.803 m)   Wt (!) 380 lb (172.4 kg)   SpO2 99%   BMI 53.00 kg/m   Physical Exam    Constitutional: He is oriented to person, place, and time. Vital signs are normal. He appears well-developed and well-nourished. No distress.  HENT:  Head: Normocephalic and atraumatic.  Right Ear: Hearing normal.  Left Ear: Hearing normal.  Eyes: Conjunctivae and EOM are normal. Pupils are equal, round, and reactive to light.  Neck: Normal range of motion. Neck supple.  Cardiovascular: Normal rate, regular rhythm, normal heart sounds and intact distal pulses.   No murmur heard. Pulmonary/Chest: Effort normal and breath sounds normal. He exhibits no tenderness.  Abdominal: He exhibits no distension.  Musculoskeletal:  Normal range of motion.  Neurological: He is alert and oriented to person, place, and time.  Skin: Skin is warm and dry.  Psychiatric: He has a normal mood and affect. His speech is normal and behavior is normal. Thought content normal.  Nursing note and vitals reviewed.  ED Treatments / Results  DIAGNOSTIC STUDIES:  Oxygen Saturation is 99% on RA, normal by my interpretation.    COORDINATION OF CARE:  5:02 PM Discussed treatment plan with pt at bedside which includes review of pt's EKG for abnormalities and pt agreed to plan.  Labs (all labs ordered are listed, but only abnormal results are displayed) Labs Reviewed  CBC - Abnormal; Notable for the following:       Result Value   Hemoglobin 12.2 (*)    HCT 36.5 (*)    All other components within normal limits  BASIC METABOLIC PANEL - Abnormal; Notable for the following:    Glucose, Bld 146 (*)    Calcium 8.8 (*)    All other components within normal limits  TROPONIN I  MAGNESIUM  TSH    EKG  EKG Interpretation  Date/Time:  Monday February 05 2016 16:46:07 EST Ventricular Rate:  92 PR Interval:    QRS Duration: 104 QT Interval:  354 QTC Calculation: 438 R Axis:   52 Text Interpretation:  Sinus rhythm RSR' in V1 or V2, right VCD or RVH Confirmed by DELO  MD, DOUGLAS ([id number]) on 02/05/2016 5:23:30 PM       Radiology Dg Chest 2 View  Result Date: 02/05/2016 CLINICAL DATA:  Shortness of breath EXAM: CHEST  2 VIEW COMPARISON:  06/02/2009 FINDINGS: Lungs are clear.  No pleural effusion or pneumothorax. The heart is normal in size. Visualized osseous structures are within normal limits. IMPRESSION: Normal chest radiograph. Electronically Signed   By: Julian Hy M.D.   On: 02/05/2016 17:47    Procedures Procedures (including critical care time)  Medications Ordered in ED Medications - No data to display   I have reviewed the triage vital signs and the nursing notes.  Pertinent labs & imaging results that were available during my care of the patient were reviewed by me and considered in my medical decision making (see chart for details).  Clinical Course    Final Clinical Impressions(s) / ED Diagnoses  {I have reviewed and evaluated the relevant laboratory values. {I have reviewed and evaluated the relevant imaging studies. {I have interpreted the relevant EKG. {I have reviewed the relevant previous healthcare records.  {I obtained HPI from historian. {Patient discussed with supervising physician.  ED Course:  Assessment: Pt is a 35yM presents with palpitations x 1 week. No CP. Notes SOB. No N/V. No diaphoresis. No hx ACS. Risk Factors Obesity.Patient is to be discharged with recommendation to follow up with PCP in regards to today's hospital visit. Chest pain is not likely of cardiac or pulmonary etiology d/t presentation, perc negative, VSS, no tracheal deviation, no JVD or new murmur, RRR, breath sounds equal bilaterally, EKG without acute abnormalities, negative troponin, and negative CXR. Discussed with supervising physician. Counseled patient on reduction of caffeine and activity modifiers to aid in palpitations. Follow up to PCP for possible Holter Monitoring. Pt has been advised to return to the ED is CP becomes exertional, associated with diaphoresis or nausea, radiates to left  jaw/arm, worsens or becomes concerning in any way. Pt appears reliable for follow up and is agreeable to discharge. Patient is in no acute distress. Vital Signs are stable.  Patient is able to ambulate. Patient able to tolerate PO.   Disposition/Plan:  DC Home Additional Verbal discharge instructions given and discussed with patient.  Pt Instructed to f/u with PCP in the next week for evaluation and treatment of symptoms. Return precautions given Pt acknowledges and agrees with plan  Supervising Physician Veryl Speak, MD  Final diagnoses:  Palpitations    New Prescriptions New Prescriptions   No medications on file   I personally performed the services described in this documentation, which was scribed in my presence. The recorded information has been reviewed and is accurate.     Shary Decamp, PA-C 02/05/16 1813    Veryl Speak, MD 02/05/16 2021

## 2016-02-05 NOTE — Discharge Instructions (Addendum)
Please read and follow all provided instructions.  Your diagnoses today include:  1. Palpitations     Tests performed today include: An EKG of your heart A chest x-ray Cardiac enzymes - a blood test for heart muscle damage Blood counts and electrolytes Vital signs. See below for your results today.   Medications prescribed:   Take any prescribed medications only as directed.  Follow-up instructions: Please follow-up with your primary care provider as soon as you can for further evaluation of your symptoms. Suggest Holter Monitoring for further evaluation   Return instructions:  SEEK IMMEDIATE MEDICAL ATTENTION IF: You have severe chest pain, especially if the pain is crushing or pressure-like and spreads to the arms, back, neck, or jaw, or if you have sweating, nausea (feeling sick to your stomach), or shortness of breath. THIS IS AN EMERGENCY. Don't wait to see if the pain will go away. Get medical help at once. Call 911 or 0 (operator). DO NOT drive yourself to the hospital.  Your chest pain gets worse and does not go away with rest.  You have an attack of chest pain lasting longer than usual, despite rest and treatment with the medications your caregiver has prescribed.  You wake from sleep with chest pain or shortness of breath. You feel dizzy or faint. You have chest pain not typical of your usual pain for which you originally saw your caregiver.  You have any other emergent concerns regarding your health.  Additional Information: Chest pain comes from many different causes. Your caregiver has diagnosed you as having chest pain that is not specific for one problem, but does not require admission.  You are at low risk for an acute heart condition or other serious illness.   Your vital signs today were: BP 163/95 (BP Location: Left Arm)    Pulse 97    Temp 97.7 F (36.5 C) (Oral)    Resp 20    Ht 5\' 11"  (1.803 m)    Wt (!) 172.4 kg    SpO2 99%    BMI 53.00 kg/m  If your blood  pressure (BP) was elevated above 135/85 this visit, please have this repeated by your doctor within one month. --------------

## 2016-02-05 NOTE — ED Triage Notes (Signed)
C/o palpitations x 1 week-SOB x today-denies CP-NAD-steady gait

## 2016-02-05 NOTE — ED Notes (Signed)
IV attempted x1 without success

## 2016-02-06 ENCOUNTER — Encounter: Payer: Self-pay | Admitting: Internal Medicine

## 2016-02-06 LAB — TSH: TSH: 2.275 u[IU]/mL (ref 0.350–4.500)

## 2016-02-16 ENCOUNTER — Ambulatory Visit: Payer: 59 | Admitting: Internal Medicine

## 2016-02-20 NOTE — Progress Notes (Signed)
Cardiology Office Note    Date:  02/21/2016   ID:  Julian Washington, DOB 1980/06/18, MRN [id number]  PCP:  Kathlene November, MD  Cardiologist:  New to Dr. Burt Knack   Chief Complaint: Palpitations  History of Present Illness:   Julian Washington is a 36 y.o. male OSA on CPAP, hypogonadotropic hypogonadism, pituitary mass s/p adenoma removal 2014 and pre-diabetes who recently seen in ER for palpitation who presents for further evaluation.   Seen in ER 02/05/16 for 1 week hx of intermittent palpitations. Negative troponin. Normal CXR. Normal TSH, electrolyte and hemoglobin. EKG sinus rhythm at rate of 92 bpm. No acute findings. He has been taking Saxenda/Victosa for weight loss and tried discontinuing taking this medication to alleviate his symptoms with no relief.   Here today for further evaluation. His palpitations occurs 3-4 times/day and last for about 15-20 minutes. He gets dizzy, dyspneic  and diaphoretic with this episode. This has been stable since beginning of December 2017. No improvement in one after discontinuation of Saxenda (takes for weight loss). He denies any chest pain, orthopnea, PND, syncope, lower extreme edema, melena or blood in his stool or urine. He drinks one cup of coffee every morning and occasionally soda/tea in afternoon. No family history of premature heart disease. Paternal grandfather had a CABG and aortic aneurysm. Maternal grandfather had brain cancer.  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 He occasionally attached himself on a monitor which showed PACs at a rate of 120s to 130s.   Past Medical History:  Diagnosis Date  . Anxiety and depression    h/o suicidality w/ citalopram  . Difficult intubation   . ETD (eustachian tube dysfunction)    s/p ENT, declined ear tuves before   . GERD (gastroesophageal reflux disease)   . History of chicken pox    Titered on 08/16/2005  . Hypogonadism male    Dr Cruzita Lederer, used to see urology  . Internal hemorrhoids    s/p banding  . Migraine    on topamax  . OSA on CPAP   . 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[place])    h/o increased prolactin, Dr Cruzita Lederer (previously @ [facility])  . Prediabetes    A1C 6.2 years ago  . 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 Vitamin B 12 deficiency    h/o  . Vitamin D deficiency     Past Surgical History:  Procedure Laterality Date  . 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 TONSILLECTOMY AND ADENOIDECTOMY      Current Medications: Prior to Admission medications   Medication Sig Start Date End Date Taking? Authorizing Provider  B Complex Vitamins (B COMPLEX-B12 PO) Take by mouth.    Historical Provider, MD  Blood Glucose Monitoring Suppl (TRUE METRIX METER) w/Device KIT Check blood sugar no more than twice daily Patient not taking: Reported on 01/19/2016 11/30/15   Colon Branch, MD  Calcium Carbonate-Vitamin D (CALTRATE 600+D) 600-400 MG-UNIT per chew tablet Chew 1 tablet by mouth daily. None Chew    Historical Provider, MD  fexofenadine (ALLEGRA) 180 MG tablet Take 180 mg by mouth daily.    Historical Provider, MD  fluticasone (FLONASE) 50 MCG/ACT nasal spray Place 2 sprays into both nostrils daily.    Historical Provider, MD  glucose blood (TRUE METRIX BLOOD GLUCOSE TEST) test strip Check blood sugar no more than twice daily Patient not taking: Reported on 01/19/2016 11/30/15   [facility]  Ladona Horns, MD  Insulin Pen Needle 32G X 6 MM MISC Use w/ Saxenda Patient not taking: Reported on 01/19/2016 11/30/15   Colon Branch, MD  Lancets MISC Check blood sugar no more than twice daily Patient not taking: Reported on 01/19/2016 11/30/15   Colon Branch, MD  Liraglutide -Weight Management (SAXENDA) 18 MG/3ML SOPN Inject 3 mg into the skin daily. Use dosing schedule per MD. 11/30/15   Colon Branch, MD  Magnesium Oxide (MAG-OXIDE PO) Take 500 mg by mouth daily.    Historical Provider, MD  metFORMIN (GLUCOPHAGE-XR) 500 MG 24 hr tablet TAKE 2 TABLETS (1,000 MG TOTAL) BY MOUTH DAILY WITH  BREAKFAST. Patient taking differently: TAKE 2 TABLETS (1,000 MG TOTAL) BY MOUTH DAILY 08/21/15   Philemon Kingdom, MD  Multiple Vitamin (MULTIVITAMIN) tablet Take 1 tablet by mouth daily.     Historical Provider, MD  pantoprazole (PROTONIX) 40 MG tablet Take 1 tablet (40 mg total) by mouth daily. 01/03/16   Colon Branch, MD  verapamil (CALAN SR) 240 MG CR tablet Take 1 tablet (240 mg total) by mouth at bedtime. 11/28/15   Colon Branch, MD  vitamin B-12 (CYANOCOBALAMIN) 250 MCG tablet Take 500 mcg by mouth daily.     Historical Provider, MD    Allergies:   Levaquin [levofloxacin in d5w]; Celexa [citalopram]; Dextrans; Hydrocodone-acetaminophen; Latex; and Oxycodone   Social History   Social History  . Marital status: Married    Spouse name: N/A  . Number of children: 0  . Years of education: N/A   Occupational History  . RN, [employer]   Social History Main Topics  . Smoking status: Never Smoker  . Smokeless tobacco: Never Used  . Alcohol use 0.0 oz/week     Comment: occ  . Drug use: No  . Sexual activity: Not Asked   Other Topics Concern  . None   Social History Narrative   Lives w/ wife     Family History:  The patient's family history includes CAD in his other; Diabetes in his paternal grandfather and paternal grandmother; Hypertension in his paternal grandmother; Prostate cancer in his other.   ROS:   Please see the history of present illness.    ROS All other systems reviewed and are negative.   PHYSICAL EXAM:   VS:  BP 140/70   Pulse 72   Ht _0  (1.803 m)   Wt (!) 382 lb 1.9 oz (173.3 kg)   SpO2 97%   BMI 53.29 kg/m    GEN: obese male  in no acute distress  HEENT: normal  Neck: no JVD, carotid bruits, or masses Cardiac: RRR; no murmurs, rubs, or gallops,no edema  Respiratory:  clear to auscultation bilaterally, normal work of breathing GI: soft, nontender, nondistended, + BS MS: no deformity or atrophy  Skin: warm and dry, no rash Neuro:   Alert and Oriented x 3, Strength and sensation are intact Psych: euthymic mood, full affect  Wt Readings from Last 3 Encounters:  02/21/16 (!) 382 lb 1.9 oz (173.3 kg)  02/05/16 (!) 380 lb (172.4 kg)  01/19/16 (!) 375 lb 2 oz (170.2 kg)      Studies/Labs Reviewed:   EKG:  EKG is ordered today.  The ekg ordered today demonstrates normal sinus rhythm at rate of 72 bpm.  Recent Labs: 02/22/2015: ALT 19 02/05/2016: BUN 16; Creatinine, Ser 0.90; Hemoglobin 12.2; Magnesium 1.9; Platelets 163; Potassium 4.3; Sodium 139; TSH 2.275   Lipid  Panel    Component Value Date/Time   CHOL 115 07/26/2015 1030   TRIG 60.0 07/26/2015 1030   HDL 40.60 07/26/2015 1030   CHOLHDL 3 07/26/2015 1030   VLDL 12.0 07/26/2015 1030   LDLCALC 62 07/26/2015 1030    Additional studies/ records that were reviewed today include:   As above   ASSESSMENT & PLAN:    1. Palpitations - This has been stable despite discontinuation of weight loss medication as described above. TSH normal in ER. Seems like he is symptomatic with PACs. Will get 48 hour Holter monitor for further evaluation. We will add Toprol-XL 12.5 mg at bedtime in addition to CCB (he takes for Migraine). Heart exam is completely normal. Advise complete cessation of caffeinated products. Discussed with DOD (Dr. Burt Knack) and he aggress with plan.   2. Elevated blood pressure - No formal diagnosis of hypertension. Blood pressure is 140/70 here. States that his blood pressure has been normal. We will continue to monitor.  3. OSA  - Compliant with CPAP     Medication Adjustments/Labs and Tests Ordered: Current medicines are reviewed at length with the patient today.  Concerns regarding medicines are outlined above.  Medication changes, Labs and Tests ordered today are listed in the Patient Instructions below. Patient Instructions  Your physician has recommended you make the following change in your medication: METOPROLOL  12.5 MG  AT BEDTIME    Your physician recommends that you schedule a follow-up appointment in: 2-3  [place] physician has recommended that you wear a holter monitor. Holter monitors are medical devices that record the heart's electrical activity. Doctors most often use these monitors to diagnose arrhythmias. Arrhythmias are problems with the speed or rhythm of the heartbeat. The monitor is a small, portable device. You can wear one while you do your normal daily activities. This is usually used to diagnose what is causing palpitations/syncope (passing out).   [address] [place], [place]  02/21/2016 8:52 AM    [facility] [place], [place], [place]  [address] Phone: [phone]; Fax: [phone]

## 2016-02-21 ENCOUNTER — Ambulatory Visit (INDEPENDENT_AMBULATORY_CARE_PROVIDER_SITE_OTHER): Payer: 59 | Admitting: Physician Assistant

## 2016-02-21 ENCOUNTER — Ambulatory Visit (INDEPENDENT_AMBULATORY_CARE_PROVIDER_SITE_OTHER): Payer: 59

## 2016-02-21 ENCOUNTER — Encounter: Payer: Self-pay | Admitting: Physician Assistant

## 2016-02-21 VITALS — BP 140/70 | HR 72 | Ht 71.0 in | Wt 382.1 lb

## 2016-02-21 DIAGNOSIS — R002 Palpitations: Secondary | ICD-10-CM

## 2016-02-21 DIAGNOSIS — Z9989 Dependence on other enabling machines and devices: Secondary | ICD-10-CM | POA: Diagnosis not present

## 2016-02-21 DIAGNOSIS — G4733 Obstructive sleep apnea (adult) (pediatric): Secondary | ICD-10-CM | POA: Diagnosis not present

## 2016-02-21 DIAGNOSIS — R030 Elevated blood-pressure reading, without diagnosis of hypertension: Secondary | ICD-10-CM | POA: Diagnosis not present

## 2016-02-21 MED ORDER — METOPROLOL SUCCINATE ER 25 MG PO TB24
12.5000 mg | ORAL_TABLET | Freq: Every day | ORAL | 3 refills | Status: DC
Start: 2016-02-21 — End: 2016-03-06

## 2016-02-21 MED FILL — METFORMIN HCL ER 500 MG TAB: 500 | 90 days supply | Qty: 180 | Fill #0 | Status: TO

## 2016-02-21 MED FILL — METOPROLOL SUCC ER 25 MG TA: 25 | 90 days supply | Qty: 45 | Fill #0

## 2016-02-21 NOTE — Patient Instructions (Addendum)
Your physician has recommended you make the following change in your medication: METOPROLOL  12.5 MG  AT BEDTIME   Your physician recommends that you schedule a follow-up appointment in: 2-3  [place] physician has recommended that you wear a holter monitor. Holter monitors are medical devices that record the heart's electrical activity. Doctors most often use these monitors to diagnose arrhythmias. Arrhythmias are problems with the speed or rhythm of the heartbeat. The monitor is a small, portable device. You can wear one while you do your normal daily activities. This is usually used to diagnose what is causing palpitations/syncope (passing out).   [place]

## 2016-02-24 DIAGNOSIS — H6501 Acute serous otitis media, right ear: Secondary | ICD-10-CM | POA: Diagnosis not present

## 2016-02-24 DIAGNOSIS — J111 Influenza due to unidentified influenza virus with other respiratory manifestations: Secondary | ICD-10-CM | POA: Diagnosis not present

## 2016-02-28 NOTE — Progress Notes (Signed)
Cardiology Office Note    Date:  03/06/2016   ID:  KAAN TOSH, DOB 10/29/1980, MRN [id number]  PCP:  Kathlene November, MD  Cardiologist:  Dr. Burt Knack  Chief Complaint: Palpitations  History of Present Illness:   Julian Washington is a 36 y.o. male Julian Washington is a 36 y.o. male OSA on CPAP, hypogonadotropic hypogonadism, pituitary mass s/p adenoma removal 2014 and pre-diabetes presents for palpitation follow up.   Seen in ER 02/05/16 for 1 week hx of intermittent palpitations. Negative troponin. Normal CXR. Normal TSH, electrolyte and hemoglobin. EKG sinus rhythm at rate of 92 bpm. No acute findings. He has been taking Saxenda/Victosa for weight loss and tried discontinuing taking this medication to alleviate his symptoms with no relief.   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 He occasionally attached himself on a monitor which showed PACs at a rate of 120s to 130s.  Seen in clinic my me for initial consultation. He is symptomatic with palpitations. Advised to complete cessation of caffinee.  Started on BB. 24 hours monitor showed PACs and rare PVCs.   Her today for follow-up. No reoccurrence of palpitations since last office visit. He has not has started metoprolol yet. He drank caffeinated soda for one day without any symptoms. He denies any dizziness, palpitations, syncope, chest pain, shortness of breath, lower extremity edema.  Paternal grandfather had a CABG and aortic aneurysm. Maternal grandfather had brain cancer.   Past Medical History:  Diagnosis Date  . Anxiety and depression    h/o suicidality w/ citalopram  . Difficult intubation   . ETD (eustachian tube dysfunction)    s/p ENT, declined ear tuves before   . GERD (gastroesophageal reflux disease)   . History of chicken pox    Titered on 08/16/2005  . Hypogonadism male    Dr Cruzita Lederer, used to see urology  . Internal hemorrhoids    s/p banding  . Migraine    on topamax  . OSA on CPAP   . Pituitary mass  ([place])    h/o increased prolactin, Dr Cruzita Lederer (previously @ [facility])  . Prediabetes    A1C 6.2 years ago  . 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 Vitamin B 12 deficiency    h/o  . Vitamin D deficiency     Past Surgical History:  Procedure Laterality Date  . 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 TONSILLECTOMY AND ADENOIDECTOMY      Current Medications: Prior to Admission medications   Medication Sig Start Date End Date Taking? Authorizing Provider  B Complex Vitamins (B COMPLEX-B12 PO) Take 1 tablet by mouth daily.     Historical Provider, MD  Blood Glucose Monitoring Suppl (TRUE METRIX METER) w/Device KIT Check blood sugar no more than twice daily 11/30/15   Colon Branch, MD  Calcium Carbonate-Vitamin D (CALTRATE 600+D) 600-400 MG-UNIT per chew tablet Chew 1 tablet by mouth daily. None Chew    Historical Provider, MD  fexofenadine (ALLEGRA) 180 MG tablet Take 180 mg by mouth daily.    Historical Provider, MD  fluticasone (FLONASE) 50 MCG/ACT nasal spray Place 2 sprays into both nostrils daily.    Historical Provider, MD  glucose blood (TRUE METRIX BLOOD GLUCOSE TEST) test strip Check blood sugar no more than twice daily 11/30/15   Colon Branch, MD  Insulin Pen Needle 32G X 6 MM MISC Use w/ Kirke Shaggy 11/30/15  Colon Branch, MD  Lancets MISC Check blood sugar no more than twice daily 11/30/15   Colon Branch, MD  Liraglutide -Weight Management (SAXENDA) 18 MG/3ML SOPN Inject 3 mg into the skin daily. Use dosing schedule per MD. 11/30/15   Colon Branch, MD  Magnesium Oxide (MAG-OXIDE PO) Take 500 mg by mouth daily.    Historical Provider, MD  metFORMIN (GLUCOPHAGE) 500 MG tablet Take 500 mg by mouth 2 (two) times daily with a meal.    Historical Provider, MD  metoprolol succinate (TOPROL-XL) 25 MG 24 hr tablet Take 0.5 tablets (12.5 mg total) by mouth daily. 02/21/16   Leanor Kail, PA  Multiple Vitamin (MULTIVITAMIN) tablet Take 1 tablet by mouth daily.      Historical Provider, MD  pantoprazole (PROTONIX) 40 MG tablet Take 1 tablet (40 mg total) by mouth daily. 01/03/16   Colon Branch, MD  verapamil (CALAN SR) 240 MG CR tablet Take 1 tablet (240 mg total) by mouth at bedtime. 11/28/15   Colon Branch, MD  vitamin B-12 (CYANOCOBALAMIN) 250 MCG tablet Take 500 mcg by mouth daily.     Historical Provider, MD    Allergies:   Levaquin [levofloxacin in d5w]; Celexa [citalopram]; Dextrans; Hydrocodone-acetaminophen; Latex; and Oxycodone   Social History   Social History  . Marital status: Married    Spouse name: N/A  . Number of children: 0  . Years of education: N/A   Occupational History  . RN, [employer]   Social History Main Topics  . Smoking status: Never Smoker  . Smokeless tobacco: Never Used  . Alcohol use 0.0 oz/week     Comment: occ  . Drug use: No  . Sexual activity: Not Asked   Other Topics Concern  . None   Social History Narrative   Lives w/ wife     Family History:  The patient's family history includes CAD in his other; Diabetes in his paternal grandfather and paternal grandmother; Hypertension in his paternal grandmother; Prostate cancer in his other.   ROS:   Please see the history of present illness.    ROS All other systems reviewed and are negative.   PHYSICAL EXAM:   VS:  BP 130/76 (BP Location: Right Arm, Patient Position: Sitting, Cuff Size: Large)   Pulse 76   Ht _0  (1.803 m)   Wt (!) 380 lb (172.4 kg)   BMI 53.00 kg/m    GEN: Well nourished, well developed, in no acute distress  HEENT: normal  Neck: no JVD, carotid bruits, or masses Cardiac:RRR; no murmurs, rubs, or gallops,no edema  Respiratory:  clear to auscultation bilaterally, normal work of breathing GI: soft, nontender, nondistended, + BS MS: no deformity or atrophy  Skin: warm and dry, no rash Neuro:  Alert and Oriented x 3, Strength and sensation are intact Psych: euthymic mood, full affect  Wt Readings from Last 3  Encounters:  03/06/16 (!) 380 lb (172.4 kg)  02/21/16 (!) 382 lb 1.9 oz (173.3 kg)  02/05/16 (!) 380 lb (172.4 kg)      Studies/Labs Reviewed:   EKG:  EKG is not ordered today.    Recent Labs: 02/05/2016: BUN 16; Creatinine, Ser 0.90; Hemoglobin 12.2; Magnesium 1.9; Platelets 163; Potassium 4.3; Sodium 139; TSH 2.275   Lipid Panel    Component Value Date/Time   CHOL 115 07/26/2015 1030   TRIG 60.0 07/26/2015 1030   HDL 40.60 07/26/2015 1030   CHOLHDL 3 07/26/2015 1030  VLDL 12.0 07/26/2015 1030   LDLCALC 62 07/26/2015 1030    Additional studies/ records that were reviewed today include:   As above   ASSESSMENT & PLAN:    1. Palpitations - No reoccurrence since last office visit. He has not started Toprol-XL yet. No episode of palpitation after drinking few 20 oz caffeinated soda. 24-hour Holter monitor showed PACs and few PVCs. No arrhythmia noted. His symptoms has been improved. Will get echocardiogram to rule out any structural abnormality given family history of CAD. Discontinue long-acting beta blocker. We will give him a prescription of short acting metoprolol 12.5 mg as needed. He will let us know if reoccurrence of palpitation/worsening symptoms.  2. OSA on CPAP - Compliant  3. Obesity - Estimated body mass index is 53 kg/m as calculated from the following:   Height as of this encounter: 5' 11" (1.803 m).   Weight as of this encounter: 380 lb (172.4 kg).   - Working on diet and exercise for weight loss.   4. Elevated BP - At times. BP normal today. He will keep eye.   Medication Adjustments/Labs and Tests Ordered: Current medicines are reviewed at length with the patient today.  Concerns regarding medicines are outlined above.  Medication changes, Labs and Tests ordered today are listed in the Patient Instructions below. There are no Patient Instructions on file for this visit.   Jarrett Soho, [place]  03/06/2016 9:53 AM    [facility] [place], [place], [place]  [address] Phone: [phone]; Fax: [phone]

## 2016-03-01 ENCOUNTER — Telehealth: Payer: Self-pay | Admitting: *Deleted

## 2016-03-01 NOTE — Telephone Encounter (Signed)
Left message for pt to cal back re: his appt with Vin, PA.  We need to make sure pt has had his heart monitor and it will be resulted before his appt on 03/06/16.

## 2016-03-01 NOTE — Telephone Encounter (Signed)
Returned pts call.  He already had wore his 48 hour monitor and turned it in last week.  We will keep f/u 03/06/16.

## 2016-03-01 NOTE — Telephone Encounter (Signed)
Follow Up ° °Pt returning call from earlier. Please call. °

## 2016-03-01 NOTE — Telephone Encounter (Signed)
-----   Message from [place], [place] sent at 02/29/2016 11:39 AM EST ----- This patient is on my schedule 03/07/15. I saw him last week for palpitations. Plan to see him after monitor. He hasn't got it yet.   Is he having worse palpitation? If not, I will see him after monitor.   Thanks  [employer]

## 2016-03-02 NOTE — Telephone Encounter (Signed)
Dr. Burt Knack will review the report prior to appointment. Please forward.

## 2016-03-06 ENCOUNTER — Ambulatory Visit (INDEPENDENT_AMBULATORY_CARE_PROVIDER_SITE_OTHER): Payer: 59 | Admitting: Physician Assistant

## 2016-03-06 ENCOUNTER — Encounter: Payer: Self-pay | Admitting: Physician Assistant

## 2016-03-06 ENCOUNTER — Telehealth: Payer: Self-pay | Admitting: Pulmonary Disease

## 2016-03-06 VITALS — BP 130/76 | HR 76 | Ht 71.0 in | Wt 380.0 lb

## 2016-03-06 DIAGNOSIS — Z9989 Dependence on other enabling machines and devices: Secondary | ICD-10-CM

## 2016-03-06 DIAGNOSIS — R002 Palpitations: Secondary | ICD-10-CM

## 2016-03-06 DIAGNOSIS — G4733 Obstructive sleep apnea (adult) (pediatric): Secondary | ICD-10-CM | POA: Diagnosis not present

## 2016-03-06 DIAGNOSIS — R030 Elevated blood-pressure reading, without diagnosis of hypertension: Secondary | ICD-10-CM | POA: Diagnosis not present

## 2016-03-06 MED ORDER — METOPROLOL SUCCINATE ER 25 MG PO TB24
12.5000 mg | ORAL_TABLET | Freq: Every day | ORAL | 2 refills | Status: DC | PRN
Start: 2016-03-06 — End: 2016-03-08

## 2016-03-06 NOTE — Patient Instructions (Addendum)
Medication Instructions:   START TAKING METOPROLOL  12.5 MG AS NEED FOR PALPITATIONS ONLY  If you need a refill on your cardiac medications before your next appointment, please call your pharmacy.  Labwork:.NONE ORDERED  TODAY    Testing/Procedures: Your physician has requested that you have an echocardiogram. Echocardiography is a painless test that uses sound waves to create images of your heart. It provides your doctor with information about the size and shape of your heart and how well your heart's chambers and valves are working. This procedure takes approximately one hour. There are no restrictions for this procedure.     Follow-Up: WILL BE DETERMINED AFTER ECHO RESULTS.Marland Kitchen   Any Other Special Instructions Will Be Listed Below (If Applicable).

## 2016-03-06 NOTE — Telephone Encounter (Signed)
Spoke with pt, wants to know if we can get cpap download off of his cpap at his Friday appt with VS.  Pt verified that he has a resmed machine and it does have a SD card.  I advised pt that we will need this SD card for download.  Pt expressed understanding, will bring sd card to office visit.  Nothing further needed.

## 2016-03-08 ENCOUNTER — Telehealth: Payer: Self-pay | Admitting: *Deleted

## 2016-03-08 ENCOUNTER — Other Ambulatory Visit: Payer: Self-pay | Admitting: *Deleted

## 2016-03-08 ENCOUNTER — Encounter: Payer: Self-pay | Admitting: Pulmonary Disease

## 2016-03-08 ENCOUNTER — Ambulatory Visit (INDEPENDENT_AMBULATORY_CARE_PROVIDER_SITE_OTHER): Payer: 59 | Admitting: Pulmonary Disease

## 2016-03-08 VITALS — BP 134/88 | HR 93 | Ht 71.0 in | Wt 381.2 lb

## 2016-03-08 DIAGNOSIS — Z6841 Body Mass Index (BMI) 40.0 and over, adult: Secondary | ICD-10-CM

## 2016-03-08 DIAGNOSIS — G4733 Obstructive sleep apnea (adult) (pediatric): Secondary | ICD-10-CM | POA: Diagnosis not present

## 2016-03-08 DIAGNOSIS — R002 Palpitations: Secondary | ICD-10-CM

## 2016-03-08 MED ORDER — METOPROLOL TARTRATE 25 MG PO TABS: 12.5000 mg | ORAL_TABLET | Freq: Every day | ORAL | 6 refills | Status: DC | PRN

## 2016-03-08 MED FILL — METOPROLOL TARTRATE 25 MG T: 25 | 60 days supply | Qty: 30 | Fill #0

## 2016-03-08 NOTE — Patient Instructions (Signed)
Follow up in 1 year.

## 2016-03-08 NOTE — Telephone Encounter (Signed)
PT  CONTACTED AND NOTIFIED ABOUT  PRN METOPROLOL TARTRATE 12.5 PRN AS NEEDED FOR  PALPITATIONS

## 2016-03-08 NOTE — Telephone Encounter (Signed)
please call pt, he stated that the wrong metoprolol was sent to the pharmacy, thanks

## 2016-03-08 NOTE — Progress Notes (Signed)
Current Outpatient Prescriptions on File Prior to Visit  Medication Sig  . B Complex Vitamins (B COMPLEX-B12 PO) Take 1 tablet by mouth daily.   . Blood Glucose Monitoring Suppl (TRUE METRIX METER) w/Device KIT Check blood sugar no more than twice daily  . Calcium Carbonate-Vitamin D (CALTRATE 600+D) 600-400 MG-UNIT per chew tablet Chew 1 tablet by mouth daily. None Chew  . fexofenadine (ALLEGRA) 180 MG tablet Take 180 mg by mouth daily.  . fluticasone (FLONASE) 50 MCG/ACT nasal spray Place 2 sprays into both nostrils daily.  Marland Kitchen glucose blood (TRUE METRIX BLOOD GLUCOSE TEST) test strip Check blood sugar no more than twice daily  . Insulin Pen Needle 32G X 6 MM MISC Use w/ Saxenda  . Lancets MISC Check blood sugar no more than twice daily  . Liraglutide -Weight Management (SAXENDA) 18 MG/3ML SOPN Inject 3 mg into the skin daily. Use dosing schedule per MD.  . Magnesium Oxide (MAG-OXIDE PO) Take 500 mg by mouth daily.  . metFORMIN (GLUCOPHAGE) 500 MG tablet Take 500 mg by mouth 2 (two) times daily with a meal.  . Multiple Vitamin (MULTIVITAMIN) tablet Take 1 tablet by mouth daily.   . pantoprazole (PROTONIX) 40 MG tablet Take 1 tablet (40 mg total) by mouth daily.  . verapamil (CALAN SR) 240 MG CR tablet Take 1 tablet (240 mg total) by mouth at bedtime.  . vitamin B-12 (CYANOCOBALAMIN) 250 MCG tablet Take 500 mcg by mouth daily.    No current facility-administered medications on file prior to visit.     Chief Complaint  Patient presents with  . Follow-up    Wearing CPAP nightly. Denies problem with mask/pressure. DME: [employer]    Sleep tests HST 12/09/09 ([facility]) >> AHI 27.3, SaO2 low 43% CPAP 02/06/16 to 03/06/16 >> used on 30 of 30 nights with average 7 hrs 39 min.  Average AHI 0.2 with CPAP 15 cm H2O  Past medical history GERD, Pituitary mass, Anxiety, Depression, HA  Past surgical history, Family history, Social history, Allergies reviewed  Vital signs BP 134/88 (BP  Location: Left Arm, Cuff Size: Normal)   Pulse 93   Ht '5\' 11"'$  (1.803 m)   Wt (!) 381 lb 3.2 oz (172.9 kg)   SpO2 96%   BMI 53.17 kg/m   History of Present Illness: Julian Washington is a 36 y.o. male with moderate OSA.  He was treated for the flu earlier this month.  He is improving, but still has some ear congestion on the Rt.  He denies cough, wheeze, fever, or chest congestion.  He uses CPAP nightly.  No issues with mask fit.  He uses nasal mask.  He had trouble with weight loss drug > caused palpitation.  He is joining [employer].  Physical Exam:  General - pleasant ENT - no sinus tenderness, MP 4, enlarged tongue, Rt ear canal with cerumen build up TM with scarring no fluid, Lt ear canal clear TM scarring Cardiac - regular, no murmur Chest - no wheeze, rales Back - no tenderness Abd - soft, non tender Ext - no edema Neuro - normal strength Skin - no rashes Psych - normal mood   Assessment/Plan:  Obstructive sleep apnea. - he is compliant with CPAP and reports benefit - continue CPAP 15 cm H2O >> advised he could try decreased pressure if he wanted to  Morbid obesity. - he will join weight watchers   Patient Instructions  Follow up in 1 year   Akiyah Eppolito,  MD [facility] Pager:  [phone] 03/08/2016, 5:01 PM

## 2016-03-20 ENCOUNTER — Ambulatory Visit (HOSPITAL_COMMUNITY): Payer: 59 | Attending: Internal Medicine

## 2016-03-20 ENCOUNTER — Other Ambulatory Visit: Payer: Self-pay

## 2016-03-20 DIAGNOSIS — I340 Nonrheumatic mitral (valve) insufficiency: Secondary | ICD-10-CM | POA: Diagnosis not present

## 2016-03-20 DIAGNOSIS — R002 Palpitations: Secondary | ICD-10-CM | POA: Diagnosis not present

## 2016-03-24 DIAGNOSIS — H66001 Acute suppurative otitis media without spontaneous rupture of ear drum, right ear: Secondary | ICD-10-CM | POA: Diagnosis not present

## 2016-03-24 DIAGNOSIS — H6502 Acute serous otitis media, left ear: Secondary | ICD-10-CM | POA: Diagnosis not present

## 2016-03-28 MED FILL — VERAPAMIL ER 240 MG TABLET: 240 | 90 days supply | Qty: 90 | Fill #1

## 2016-03-28 MED FILL — PANTOPRAZOLE SOD DR 40 MG T: 40 | 90 days supply | Qty: 90 | Fill #1

## 2016-04-03 DIAGNOSIS — G4733 Obstructive sleep apnea (adult) (pediatric): Secondary | ICD-10-CM | POA: Diagnosis not present

## 2016-05-01 ENCOUNTER — Encounter: Payer: Self-pay | Admitting: Family Medicine

## 2016-05-01 ENCOUNTER — Telehealth: Payer: 59 | Admitting: Family

## 2016-05-01 ENCOUNTER — Ambulatory Visit (INDEPENDENT_AMBULATORY_CARE_PROVIDER_SITE_OTHER): Payer: 59 | Admitting: Family Medicine

## 2016-05-01 VITALS — HR 91 | Temp 99.1°F | Ht 71.0 in | Wt 390.0 lb

## 2016-05-01 DIAGNOSIS — L03116 Cellulitis of left lower limb: Secondary | ICD-10-CM | POA: Diagnosis not present

## 2016-05-01 DIAGNOSIS — R21 Rash and other nonspecific skin eruption: Secondary | ICD-10-CM

## 2016-05-01 DIAGNOSIS — L03115 Cellulitis of right lower limb: Secondary | ICD-10-CM | POA: Diagnosis not present

## 2016-05-01 MED ORDER — KETOCONAZOLE 2 % EX CREA: 1.0000 "application " | TOPICAL_CREAM | Freq: Two times a day (BID) | CUTANEOUS | 0 refills | Status: DC

## 2016-05-01 MED ORDER — DOXYCYCLINE HYCLATE 100 MG PO CAPS: 100.0000 mg | ORAL_CAPSULE | Freq: Two times a day (BID) | ORAL | 0 refills | Status: AC

## 2016-05-01 MED FILL — DOXYCYCLINE HYC 100 MG CAP: 100 | 30 days supply | Qty: 60 | Fill #0

## 2016-05-01 MED FILL — KETOCONAZOLE 2% CREAM: 2 | 15 days supply | Qty: 30 | Fill #0

## 2016-05-01 NOTE — Progress Notes (Signed)
   Subjective:    Patient ID: Julian Washington, male    DOB: 1980-07-16, 36 y.o.   MRN: [id number]  HPI Here for one week of redness and discomfort in both lower legs. He has chronic edema and he has had a few episodes of cellulitis in the past. He is not aware of any fevers.    Review of Systems  Constitutional: Negative.   Respiratory: Negative.   Cardiovascular: Negative for chest pain and palpitations.  Skin: Positive for color change.       Objective:   Physical Exam  Constitutional:  Morbidly obese  Cardiovascular: Normal rate, regular rhythm, normal heart sounds and intact distal pulses.   Pulmonary/Chest: Effort normal and breath sounds normal.  Musculoskeletal:  4+ edema in both lower legs  Skin:  Both lower legs show erythema, warmth, and slight tenderness           Assessment & Plan:  Cellulitis, treat with Doxycycline. Follow up with Dr. Larose Kells in a week or so. Alysia Penna, MD

## 2016-05-01 NOTE — Progress Notes (Signed)
Pre visit review using our clinic review tool, if applicable. No additional management support is needed unless otherwise documented below in the visit note. 

## 2016-05-01 NOTE — Progress Notes (Signed)
Based on what you shared with me it looks like you have a serious condition that should be evaluated in a face to face office visit.  NOTE: Even if you have entered your credit card information for this eVisit, you will not be charged.   If you are having a true medical emergency please call 911.  If you need an urgent face to face visit, [place] has four urgent care centers for your convenience. I am worried about cellulitis. It is hard to tell from the pictures, but since it does not itch and is warm I do worry about an infection.   If you need care fast and have a high deductible or no insurance consider:   [email or web address]  [phone]  [address], [place], [place] [address] 8 am to 8 pm Monday-Friday 10 am to 4 pm Saturday-Sunday   The following sites will take your  insurance:    . [facility] [place] a Provider at this Location  [address] [place], [place] [address] . 10 am to 8 pm Monday-Friday . 12 pm to 8 pm Saturday-Sunday   . [facility] [place] a Provider at this Location  [address], [place], [place] [address] . 8 am to 8 pm Monday-Friday . 9 am to 6 pm Saturday . 11 am to 6 pm Sunday   . [facility] [place] Get Driving Directions  [address] [place], [place] [address] . 8 am to 8 pm Monday-Friday . 8 am to 4 pm Saturday-Sunday   Your e-visit answers were reviewed by a board certified advanced clinical practitioner to complete your personal care plan.  Thank you for using e-Visits.

## 2016-05-01 NOTE — Patient Instructions (Signed)
WE NOW OFFER   Julian Washington's FAST TRACK!!!  SAME DAY Appointments for ACUTE CARE  Such as: Sprains, Injuries, cuts, abrasions, rashes, muscle pain, joint pain, back pain Colds, flu, sore throats, headache, allergies, cough, fever  Ear pain, sinus and eye infections Abdominal pain, nausea, vomiting, diarrhea, upset stomach Animal/insect bites  3 Easy Ways to Schedule: Walk-In Scheduling Call in scheduling Mychart Sign-up: [email or web address]         

## 2016-05-06 ENCOUNTER — Ambulatory Visit (INDEPENDENT_AMBULATORY_CARE_PROVIDER_SITE_OTHER): Payer: 59 | Admitting: Internal Medicine

## 2016-05-06 ENCOUNTER — Encounter: Payer: Self-pay | Admitting: Internal Medicine

## 2016-05-06 ENCOUNTER — Other Ambulatory Visit (INDEPENDENT_AMBULATORY_CARE_PROVIDER_SITE_OTHER): Payer: 59

## 2016-05-06 VITALS — BP 128/82 | HR 91 | Temp 98.1°F | Resp 14 | Ht 71.0 in | Wt 393.1 lb

## 2016-05-06 DIAGNOSIS — L0390 Cellulitis, unspecified: Secondary | ICD-10-CM

## 2016-05-06 LAB — BASIC METABOLIC PANEL
BUN: 14 mg/dL (ref 6–23)
CHLORIDE: 106 meq/L (ref 96–112)
CO2: 27 meq/L (ref 19–32)
CREATININE: 0.72 mg/dL (ref 0.40–1.50)
Calcium: 9.2 mg/dL (ref 8.4–10.5)
GFR: 131.96 mL/min (ref >60.00)
Glucose, Bld: 153 mg/dL — ABNORMAL HIGH (ref 70–99)
POTASSIUM: 4.3 meq/L (ref 3.5–5.1)
Sodium: 140 mEq/L (ref 135–145)

## 2016-05-06 LAB — CBC WITH DIFFERENTIAL/PLATELET
BASOS PCT: 0.7 % (ref 0.0–3.0)
Basophils Absolute: 0.1 10*3/uL (ref 0.0–0.1)
EOS PCT: 1.3 % (ref 0.0–5.0)
Eosinophils Absolute: 0.1 10*3/uL (ref 0.0–0.7)
HEMATOCRIT: 38.3 % — AB (ref 39.0–52.0)
HEMOGLOBIN: 12.7 g/dL — AB (ref 13.0–17.0)
LYMPHS PCT: 28.6 % (ref 12.0–46.0)
Lymphs Abs: 2.5 10*3/uL (ref 0.7–4.0)
MCHC: 33.1 g/dL (ref 30.0–36.0)
MCV: 81.1 fl (ref 78.0–100.0)
MONOS PCT: 5.4 % (ref 3.0–12.0)
Monocytes Absolute: 0.5 10*3/uL (ref 0.1–1.0)
NEUTROS ABS: 5.5 10*3/uL (ref 1.4–7.7)
Neutrophils Relative %: 64 % (ref 43.0–77.0)
PLATELETS: 197 10*3/uL (ref 150.0–400.0)
RBC: 4.73 Mil/uL (ref 4.22–5.81)
RDW: 14.3 % (ref 11.5–15.5)
WBC: 8.6 10*3/uL (ref 4.0–10.5)

## 2016-05-06 NOTE — Progress Notes (Signed)
Pre visit review using our clinic review tool, if applicable. No additional management support is needed unless otherwise documented below in the visit note. 

## 2016-05-06 NOTE — Progress Notes (Signed)
Subjective:    Patient ID: Julian Washington, male    DOB: [birth year], 36 y.o.   MRN: [id number]  DOS:  05/06/2016 Type of visit - description : f/u Interval history: Around 04/25/2016, pretibial area went from pink to red, he noted some increased lower extremity edema and the skin was "burning". Went to see one of my colleagues 05/01/2016, diagnosed with cellulitis and athletes foot. Was prescribed doxycycline for 30 days and ketoconazole. He improved initially but last night noted the pretibial  area was burning again. This morning, the skin looks "beefy-red" and he got concerned.  Review of Systems  No fever, some chills. No blisters. No recent airplane trips or prolonged car trips.No calf pain per se.   Past Medical History:  Diagnosis Date  . Anxiety and depression    h/o suicidality w/ citalopram  . Difficult intubation   . ETD (eustachian tube dysfunction)    s/p ENT, declined ear tuves before   . GERD (gastroesophageal reflux disease)   . History of chicken pox    Titered on 08/16/2005  . Hypogonadism male    Dr Cruzita Lederer, used to see urology  . Influenza 02/2016  . Internal hemorrhoids    s/p banding  . Migraine    on topamax  . OSA on CPAP   . 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 Prediabetes    A1C 6.2 years ago  . 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 Vitamin B 12 deficiency    h/o  . Vitamin D deficiency     Past Surgical History:  Procedure Laterality Date  . 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 TONSILLECTOMY AND ADENOIDECTOMY      Social History   Social History  . Marital status: Married    Spouse name: N/A  . Number of children: 0  . Years of education: N/A   Occupational History  . RN, [employer]   Social History Main Topics  . Smoking status: Never Smoker  . Smokeless tobacco: Never Used  . Alcohol use 0.0 oz/week     Comment: occ  .  Drug use: No  . Sexual activity: Not on file   Other Topics Concern  . Not on file   Social History Narrative   Lives w/ wife      Allergies as of 05/06/2016      Reactions   Levaquin [levofloxacin In D5w] Swelling   Celexa [citalopram] Other (See Comments)   Mental status changes    Dextrans Other (See Comments)   Makes the pt. Drowsy.    Hydrocodone-acetaminophen    REACTION: itching   Latex    Oxycodone Itching      Medication List       Accurate as of 05/06/16  6:53 PM. Always use your most recent med list.          B COMPLEX-B12 PO Take 1 tablet by mouth daily.   CALTRATE 600+D 600-400 MG-UNIT chew tablet Generic drug:  Calcium Carbonate-Vitamin D Chew 1 tablet by mouth daily. None Chew   doxycycline 100 MG capsule Commonly known as:  VIBRAMYCIN Take 1 capsule (100 mg total) by mouth 2 (two) times daily.   fexofenadine 180 MG tablet Commonly known as:  ALLEGRA Take 180 mg by mouth daily.   fluticasone 50 MCG/ACT nasal spray Commonly known as:  FLONASE Place 2 sprays  into both nostrils daily.   glucose blood test strip Commonly known as:  TRUE METRIX BLOOD GLUCOSE TEST Check blood sugar no more than twice daily   ketoconazole 2 % cream Commonly known as:  NIZORAL Apply 1 application topically 2 (two) times daily.   MAG-OXIDE PO Take 500 mg by mouth daily.   metFORMIN 500 MG tablet Commonly known as:  GLUCOPHAGE Take 500 mg by mouth 2 (two) times daily with a meal.   metoprolol tartrate 25 MG tablet Commonly known as:  LOPRESSOR Take 0.5 tablets (12.5 mg total) by mouth daily as needed (PALPITATIONS).   multivitamin tablet Take 1 tablet by mouth daily.   pantoprazole 40 MG tablet Commonly known as:  PROTONIX Take 1 tablet (40 mg total) by mouth daily.   verapamil 240 MG CR tablet Commonly known as:  CALAN SR Take 1 tablet (240 mg total) by mouth at bedtime.   vitamin B-12 250 MCG tablet Commonly known as:  CYANOCOBALAMIN Take 500 mcg  by mouth daily.          Objective:   Physical Exam BP 128/82 (BP Location: Left Arm, Patient Position: Sitting, Cuff Size: Normal)   Pulse 91   Temp 98.1 F (36.7 C) (Oral)   Resp 14   Ht 5\' 11"  (1.803 m)   Wt (!) 393 lb 2 oz (178.3 kg)   SpO2 98%   BMI 54.83 kg/m  General:   Well developed, morbidly obese . NAD.  HEENT:  Normocephalic . Face symmetric, atraumatic Lungs:  CTA B Normal respiratory effort, no intercostal retractions, no accessory muscle use. Heart: RRR,  no murmur.  Trace pretibial edema bilaterally  Skin: Pretibial skin is not warm, no redness, no other medication, there is a couple of patches of violaceous skin. See picture. Neurologic:  alert & oriented X3.  Speech normal, gait appropriate for age and unassisted Psych--  Cognition and judgment appear intact.  Cooperative with normal attention span and concentration.  Behavior appropriate. No anxious or depressed appearing.              Assessment & Plan:   Assessment   Prediabetes-- on metformin x years  Anxiety depression (suicidality w/  Citalopram) Morbid obesity Vitamin D and  B12 deficiency Chronic anemia  Endocrinology:Dr Gherghe --Pituitary mass, surgery : Prolactinoma --Hypogonadism hypogonadotropic --Infertility Migraines, d/c Topamax 05-2015 (pt concerned about short term memory), started verapamil OSA on CPAP -- DR Halford Chessman H/o ET  dysfunction, declined ear tubes H/o hemorrhoids s/p  banding H/o abd pain 2012 (w/u CT, [place], a HIDA scan), similar sx 02-2015 H/o  difficulty intubation   PLAN: Cellulitis: The patient showed me a picture of his legs prior to starting antibiotics and they do look pink, different from what they look today. He probably has a combination of resolving cellulitis and stasis dermatitis. Recommend to continue doxycycline and completed two-week treatment then stop. Leg elevation, low-salt diet. Definitely call if not better. Check a CBC and BMP Morbid  obesity: Few months ago was Rx saxenda,, subsequently developed palpitations and increased BP, was seen by cardiology , although they were not sure sxs related to saxenda, he was recommended to stop it. He is on verapamil for migraine reduction and he was given metoprolol to use as needed for palpitations with have not been a problem lately.Has an appointment to see a bariatric physician. RTC 3 months , CPX

## 2016-05-06 NOTE — Patient Instructions (Addendum)
GO TO THE LAB : Get the blood work     GO TO THE FRONT DESK Schedule your next appointment for a   physical exam in 3 months   Continue doxycycline, complete a two-week course.  Leg elevation, low-salt diet; if you have fever, chills or the skin looks very red consistently please come back or let  me know.

## 2016-05-06 NOTE — Assessment & Plan Note (Signed)
Cellulitis: The patient showed me a picture of his legs prior to starting antibiotics and they do look pink, different from what they look today. He probably has a combination of resolving cellulitis and stasis dermatitis. Recommend to continue doxycycline and completed two-week treatment then stop. Leg elevation, low-salt diet. Definitely call if not better. Check a CBC and BMP Morbid obesity: Few months ago was Rx saxenda,, subsequently developed palpitations and increased BP, was seen by cardiology , although they were not sure sxs related to saxenda, he was recommended to stop it. He is on verapamil for migraine reduction and he was given metoprolol to use as needed for palpitations with have not been a problem lately.Has an appointment to see a bariatric physician. RTC 3 months , CPX

## 2016-05-11 ENCOUNTER — Encounter: Payer: Self-pay | Admitting: Internal Medicine

## 2016-05-13 ENCOUNTER — Telehealth: Payer: Self-pay

## 2016-05-13 NOTE — Telephone Encounter (Signed)
Received fax confirmation 05/13/2016 1142.

## 2016-05-13 NOTE — Telephone Encounter (Signed)
FMLA paperwork received, completed and faxed to [employer] at [phone]. Pt informed via MyChart and forms sent for scanning.

## 2016-05-20 ENCOUNTER — Other Ambulatory Visit: Payer: Self-pay | Admitting: Internal Medicine

## 2016-05-22 ENCOUNTER — Ambulatory Visit: Payer: 59 | Admitting: Internal Medicine

## 2016-05-30 ENCOUNTER — Encounter (INDEPENDENT_AMBULATORY_CARE_PROVIDER_SITE_OTHER): Payer: 59 | Admitting: Family Medicine

## 2016-05-30 ENCOUNTER — Telehealth: Payer: Self-pay | Admitting: Internal Medicine

## 2016-05-31 MED FILL — METFORMIN HCL ER 500 MG TAB: 500 | 90 days supply | Qty: 180 | Fill #0

## 2016-05-31 NOTE — Telephone Encounter (Signed)
This patient has not been seen by you since 12/2015, does he need to come in for an OV? Or is this Rx okay to fill?  Please advise.  Thornton Papas.  Thank you!

## 2016-05-31 NOTE — Telephone Encounter (Signed)
Ok to refill for now.

## 2016-06-27 ENCOUNTER — Other Ambulatory Visit: Payer: Self-pay | Admitting: Internal Medicine

## 2016-06-27 MED FILL — VERAPAMIL ER 240 MG TABLET: 240 | 90 days supply | Qty: 90 | Fill #0

## 2016-06-27 MED FILL — PANTOPRAZOLE SOD DR 40 MG T: 40 | 90 days supply | Qty: 90 | Fill #2

## 2016-07-11 ENCOUNTER — Encounter (INDEPENDENT_AMBULATORY_CARE_PROVIDER_SITE_OTHER): Payer: Self-pay | Admitting: Family Medicine

## 2016-07-11 ENCOUNTER — Ambulatory Visit (INDEPENDENT_AMBULATORY_CARE_PROVIDER_SITE_OTHER): Payer: 59 | Admitting: Family Medicine

## 2016-07-11 VITALS — BP 142/80 | HR 78 | Temp 97.9°F | Ht 69.0 in | Wt 396.0 lb

## 2016-07-11 DIAGNOSIS — R5383 Other fatigue: Secondary | ICD-10-CM | POA: Diagnosis not present

## 2016-07-11 DIAGNOSIS — Z1389 Encounter for screening for other disorder: Secondary | ICD-10-CM

## 2016-07-11 DIAGNOSIS — E559 Vitamin D deficiency, unspecified: Secondary | ICD-10-CM | POA: Diagnosis not present

## 2016-07-11 DIAGNOSIS — Z1331 Encounter for screening for depression: Secondary | ICD-10-CM

## 2016-07-11 DIAGNOSIS — R7303 Prediabetes: Secondary | ICD-10-CM

## 2016-07-11 DIAGNOSIS — Z0289 Encounter for other administrative examinations: Secondary | ICD-10-CM

## 2016-07-11 DIAGNOSIS — R0602 Shortness of breath: Secondary | ICD-10-CM

## 2016-07-11 DIAGNOSIS — I10 Essential (primary) hypertension: Secondary | ICD-10-CM

## 2016-07-11 DIAGNOSIS — Z9189 Other specified personal risk factors, not elsewhere classified: Secondary | ICD-10-CM | POA: Diagnosis not present

## 2016-07-11 MED ORDER — ACCU-CHEK SAFE-T PRO LANCETS MISC: 0 refills | Status: AC

## 2016-07-11 MED ORDER — GLUCOSE BLOOD VI STRP: ORAL_STRIP | 0 refills | Status: DC

## 2016-07-11 NOTE — Progress Notes (Signed)
Office: [phone]  /  Fax: [phone]   HPI:   Chief Complaint: OBESITY  Julian Washington (MR# [id number]) is a 36 y.o. male who presents on 07/11/2016 for obesity evaluation and treatment. Current BMI is Body mass index is 58.48 kg/m.Marland Kitchen Julian Washington has struggled with obesity for years and has been unsuccessful in either losing weight or maintaining long term weight loss. Julian Washington attended our information session and states he is currently in the action stage of change and ready to dedicate time achieving and maintaining a healthier weight.  Julian Washington states his family eats meals together he thinks his family will eat healthier with  him he struggles with family and or coworkers weight loss sabotage his desired weight is 225 to 250 lbs he has been heavy most of  his life his heaviest weight ever was 397 lbs. he has significant food cravings issues  he skips meals frequently he is frequently drinking liquids with calories he frequently makes poor food choices he has problems with excessive hunger  he frequently eats larger portions than normal  he has binge eating behaviors he struggles with emotional eating    Fatigue Julian Washington feels his energy is lower than it should be. This has worsened with weight gain and has not worsened recently. Julian Washington admits to daytime somnolence and  admits to waking up still tired. Patient is at risk for obstructive sleep apnea. Patent has a history of symptoms of daytime fatigue and hypertension. Patient generally gets 6 or 7 hours of sleep per night, and states they generally have restful sleep. Snoring is not present. Apneic episodes are not present. Epworth Sleepiness Score is 10  Dyspnea on exertion Julian Washington notes increasing shortness of breath with exercising and seems to be worsening over time with weight gain. He notes getting out of breath sooner with activity than he used to. This has not gotten worse recently. Julian Washington denies orthopnea.  Pre-Diabetes Julian Washington has  a diagnosis of pre-diabetes, no recent Hgb A1c and was informed this puts him at greater risk of developing diabetes. Julian Washington is not checking blood sugars at home. He is taking metformin currently and continues to work on diet and exercise to decrease risk of diabetes. He denies nausea or hypoglycemia.  At risk for diabetes Julian Washington is at higher than average risk for developing diabetes due to his obesity. He currently denies polyuria or polydipsia.  Hypertension Julian Washington is a 36 y.o. male with hypertension. His blood pressure is elevated today and he is on Verapamil daily and Metoprolol prn. Julian Washington denies chest pain. He is working weight loss to help control his blood pressure with the goal of decreasing his risk of heart attack and stroke. Julian Washington blood pressure is not currently controlled.  Vitamin D deficiency Julian Washington has a diagnosis of vitamin D deficiency. No recent labs. He is currently taking calcium + vit D and multi-vitamin. He notes fatigue and denies nausea, vomiting or muscle weakness.   Depression Screen Julian Washington's Food and Mood (modified PHQ-9) score was  Depression screen PHQ 2/9 07/11/2016  Decreased Interest 2  Down, Depressed, Hopeless 2  PHQ - 2 Score 4  Altered sleeping 1  Tired, decreased energy 3  Change in appetite 3  Feeling bad or failure about yourself  2  Trouble concentrating 2  Moving slowly or fidgety/restless 3  Suicidal thoughts 1  PHQ-9 Score 19    ALLERGIES: Allergies  Allergen Reactions  . Levaquin [Levofloxacin In D5w] Swelling  . Celexa [Citalopram] Other (See  Comments)    Mental status changes   . Dextrans Other (See Comments)    Makes the pt. Drowsy.   . Hydrocodone-Acetaminophen     REACTION: itching  . Latex   . Oxycodone Itching    MEDICATIONS: Current Outpatient Prescriptions on File Prior to Visit  Medication Sig Dispense Refill  . B Complex Vitamins (B COMPLEX-B12 PO) Take 1 tablet by mouth daily.     . Calcium  Carbonate-Vitamin D (CALTRATE 600+D) 600-400 MG-UNIT per chew tablet Chew 1 tablet by mouth daily. None Chew    . fexofenadine (ALLEGRA) 180 MG tablet Take 180 mg by mouth daily.    . fluticasone (FLONASE) 50 MCG/ACT nasal spray Place 2 sprays into both nostrils daily.    . Magnesium Oxide (MAG-OXIDE PO) Take 500 mg by mouth daily.    . metFORMIN (GLUCOPHAGE) 500 MG tablet Take 500 mg by mouth 2 (two) times daily with a meal.    . Multiple Vitamin (MULTIVITAMIN) tablet Take 1 tablet by mouth daily.     . pantoprazole (PROTONIX) 40 MG tablet Take 1 tablet (40 mg total) by mouth daily. 90 tablet 3  . verapamil (CALAN-SR) 240 MG CR tablet TAKE 1 TABLET BY MOUTH AT BEDTIME 90 tablet 1  . vitamin B-12 (CYANOCOBALAMIN) 250 MCG tablet Take 500 mcg by mouth daily.     Marland Kitchen glucose blood (TRUE METRIX BLOOD GLUCOSE TEST) test strip Check blood sugar no more than twice daily (Patient not taking: Reported on 05/06/2016) 100 each 12  . ketoconazole (NIZORAL) 2 % cream Apply 1 application topically 2 (two) times daily. (Patient not taking: Reported on 07/11/2016) 30 g 0  . metFORMIN (GLUCOPHAGE-XR) 500 MG 24 hr tablet TAKE 2 TABLETS BY MOUTH DAILY WITH BREAKFAST. (Patient not taking: Reported on 07/11/2016) 180 tablet 0  . metoprolol tartrate (LOPRESSOR) 25 MG tablet Take 0.5 tablets (12.5 mg total) by mouth daily as needed (PALPITATIONS). 30 tablet 6   No current facility-administered medications on file prior to visit.     PAST MEDICAL HISTORY: Past Medical History:  Diagnosis Date  . Adenoma    Piturity  . Anemia   . Anxiety   . Anxiety and depression    h/o suicidality w/ citalopram  . Back pain   . Chest pain   . Depression   . Difficult intubation   . ETD (eustachian tube dysfunction)    s/p ENT, declined ear tuves before   . GERD (gastroesophageal reflux disease)   . History of chicken pox    Titered on 08/16/2005  . Hypogonadism male    Dr Cruzita Lederer, used to see urology  . Hypogonadism male   .  Infertility male   . Influenza 02/2016  . Internal hemorrhoids    s/p banding  . Low testosterone   . Migraine    on topamax  . OSA on CPAP   . Palpitations   . 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 Prediabetes    A1C 6.2 years ago  . 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 Shortness of breath   . Sleep apnea   . Swelling   . Vitamin B 12 deficiency    h/o  . Vitamin D deficiency     PAST SURGICAL HISTORY: Past Surgical History:  Procedure Laterality Date  . 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 TONSILLECTOMY AND ADENOIDECTOMY  SOCIAL HISTORY: Social History  Substance Use Topics  . Smoking status: Never Smoker  . Smokeless tobacco: Never Used  . Alcohol use 0.0 oz/week     Comment: occ    FAMILY HISTORY: Family History  Problem Relation Age of Onset  . Obesity Mother   . Depression Father   . Anxiety disorder Father   . Bipolar disorder Father   . Drug abuse Father   . Diabetes Paternal Grandmother   . Hypertension Paternal Grandmother   . Diabetes Paternal Grandfather   . Prostate cancer Other        GF  . CAD Other        PGF  . Colon cancer Neg Hx     ROS: Review of Systems  Constitutional: Positive for malaise/fatigue.  HENT: Positive for congestion (nasal stuffiness).   Eyes:       Wear Glasses or Contacts for distance   Respiratory: Positive for shortness of breath (on exertion).   Cardiovascular: Negative for chest pain.       Leg Cramping when not on Magnesium  Gastrointestinal: Positive for heartburn. Negative for nausea and vomiting.  Musculoskeletal: Positive for back pain.       Negative muscle weakness  Skin:       Rashes dryness  Neurological: Positive for weakness and headaches.  Endo/Heme/Allergies:       Hay Fever Negative hypoglycemia    PHYSICAL EXAM: Blood pressure (!) 142/80, pulse 78, temperature 97.9 F (36.6 C), temperature  source Oral, height 5\' 9"  (1.753 m), weight (!) 396 lb (179.6 kg), SpO2 98 %. Body mass index is 58.48 kg/m. Physical Exam  Constitutional: He is oriented to person, place, and time. He appears well-developed and well-nourished.  Cardiovascular: Normal rate.   Pulmonary/Chest: Effort normal.  Musculoskeletal: Normal range of motion.  Neurological: He is oriented to person, place, and time.  Skin: Skin is warm and dry.  Vitals reviewed.   RECENT LABS AND TESTS: BMET    Component Value Date/Time   NA 140 05/06/2016 1126   K 4.3 05/06/2016 1126   CL 106 05/06/2016 1126   CO2 27 05/06/2016 1126   GLUCOSE 153 (H) 05/06/2016 1126   BUN 14 05/06/2016 1126   CREATININE 0.72 05/06/2016 1126   CREATININE 0.73 01/19/2016 1624   CALCIUM 9.2 05/06/2016 1126   GFRNONAA >60 02/05/2016 1730   GFRAA >60 02/05/2016 1730   Lab Results  Component Value Date   HGBA1C 5.7 01/10/2016   No results found for: INSULIN CBC    Component Value Date/Time   WBC 8.6 05/06/2016 1126   RBC 4.73 05/06/2016 1126   HGB 12.7 (L) 05/06/2016 1126   HCT 38.3 (L) 05/06/2016 1126   PLT 197.0 05/06/2016 1126   MCV 81.1 05/06/2016 1126   MCV 83.0 02/25/2014 1140   MCH 27.4 02/05/2016 1730   MCHC 33.1 05/06/2016 1126   RDW 14.3 05/06/2016 1126   LYMPHSABS 2.5 05/06/2016 1126   MONOABS 0.5 05/06/2016 1126   EOSABS 0.1 05/06/2016 1126   BASOSABS 0.1 05/06/2016 1126   Iron/TIBC/Ferritin/ %Sat    Component Value Date/Time   IRON 48 07/26/2015 1030   FERRITIN 79.2 07/26/2015 1030   IRONPCTSAT 13.8 (L) 07/08/2014 0848   Lipid Panel     Component Value Date/Time   CHOL 115 07/26/2015 1030   TRIG 60.0 07/26/2015 1030   HDL 40.60 07/26/2015 1030   CHOLHDL 3 07/26/2015 1030   VLDL 12.0 07/26/2015 1030   LDLCALC 62 07/26/2015  1030   Hepatic Function Panel     Component Value Date/Time   PROT 6.6 02/22/2015 0902   ALBUMIN 4.2 02/22/2015 0902   AST 15 02/22/2015 0902   ALT 19 02/22/2015 0902    ALKPHOS 76 02/22/2015 0902   BILITOT 0.4 02/22/2015 0902      Component Value Date/Time   TSH 2.275 02/05/2016 1730   TSH 1.27 01/10/2016 0835   TSH 2.71 07/08/2014 0848    ECG  shows NSR with a rate of 79 BPM INDIRECT CALORIMETER done today shows a VO2 of 376 and a REE of 2614.    ASSESSMENT AND PLAN: Other fatigue - Plan: EKG 12-Lead, Comprehensive metabolic panel, CBC With Differential, Vitamin B12, Folate, Lipid Panel With LDL/HDL Ratio, T3, T4, free, TSH  Shortness of breath on exertion  Essential hypertension  Vitamin D deficiency - Plan: VITAMIN D 25 Hydroxy (Vit-D Deficiency, Fractures)  Prediabetes - Plan: Hemoglobin A1c, Insulin, random  Depression screening  At risk for diabetes mellitus  Morbid obesity ([place])  PLAN:  Fatigue Jowell was informed that his fatigue may be related to obesity, depression or many other causes. Labs will be ordered, and in the meanwhile Piper has agreed to work on diet, exercise and weight loss to help with fatigue. Proper sleep hygiene was discussed including the need for 7-8 hours of quality sleep each night. A sleep study was not ordered based on symptoms and Epworth score.  Dyspnea on exertion Saifan's shortness of breath appears to be obesity related and exercise induced. He has agreed to work on weight loss and gradually increase exercise to treat his exercise induced shortness of breath. If Stanislaus follows our instructions and loses weight without improvement of his shortness of breath, we will plan to refer to pulmonology. We will monitor this condition regularly. Tomas agrees to this plan.  Pre-Diabetes Tkai will continue to work on weight loss, exercise, and decreasing simple carbohydrates in his diet to help decrease the risk of diabetes. We dicussed metformin including benefits and risks. He was informed that eating too many simple carbohydrates or too many calories at one sitting increases the likelihood of GI side  effects. We will refill strips and lancets #100 each and he agrees to check blood sugars bid. Rembert agreed to follow up with [place] as directed to monitor his progress.  Diabetes risk counselling Ho was given extended (at least 15 minutes) diabetes prevention counseling today. He is 36 y.o. male and has risk factors for diabetes including obesity and pre-diabetes. We discussed intensive lifestyle modifications today with an emphasis on weight loss as well as increasing exercise and decreasing simple carbohydrates in his diet.  Hypertension We discussed sodium restriction, working on healthy weight loss, and a regular exercise program as the means to achieve improved blood pressure control. Daevion agreed with this plan and agreed to follow up as directed. We will continue to monitor his blood pressure as well as his progress with the above lifestyle modifications. We will re-check blood pressure in 2 weeks. He will continue his medications as prescribed, may need to start an ACE. He will watch for signs of hypotension as he continues his lifestyle modifications.  Vitamin D Deficiency Gared was informed that low vitamin D levels contributes to fatigue and are associated with obesity, breast, and colon cancer. We will check labs  and will follow up for routine testing of vitamin D, at least 2-3 times per year. He was informed of the risk of over-replacement of vitamin  D and agrees to not increase his dose unless he discusses this with [place] first. Fount agrees to follow up with [place] in 2 weeks to monitor his progress.  Depression Screen Alix had a strongly positive depression screening. Depression is commonly associated with obesity and often results in emotional eating behaviors. We will monitor this closely and work on CBT to help improve the non-hunger eating patterns. Referral to Psychology may be required if no improvement is seen as he continues in our clinic.  Obesity Burley is currently in the  action stage of change and his goal is to continue with weight loss efforts He has agreed to follow the Category 3 plan  +200 calories Niranjan has been instructed to work up to a goal of 150 minutes of combined cardio and strengthening exercise per week for weight loss and overall health benefits. We discussed the following Behavioral Modification Strategies today: increasing lean protein intake, decrease eating out and work on meal planning and easy cooking plans  Pheng has agreed to follow up with our clinic in 2 weeks. He was informed of the importance of frequent follow up visits to maximize his success with intensive lifestyle modifications for his multiple health conditions. He was informed we would discuss his lab results at his next visit unless there is a critical issue that needs to be addressed sooner. Byran agreed to keep his next visit at the agreed upon time to discuss these results.  I, Doreene Nest, am acting as scribe for Dennard Nip, MD  I have reviewed the above documentation for accuracy and completeness, and I agree with the above. -Dennard Nip, MD  OBESITY BEHAVIORAL INTERVENTION VISIT  Today's visit was # 1 out of 22.  Starting weight: 396 lbs Starting date: 07/11/16 Today's weight : 396 lbs Today's date: 07/11/2016 Total lbs lost to date: 0 (Patients must lose 7 lbs in the first 6 months to continue with counseling)   ASK: We discussed the diagnosis of obesity with Julian Washington today and Sostenes agreed to give [place] permission to discuss obesity behavioral modification therapy today.  ASSESS: Jakobi has the diagnosis of obesity and his BMI today is 58.6 Sabian is in the action stage of change   ADVISE: Parag was educated on the multiple health risks of obesity as well as the benefit of weight loss to improve his health. He was advised of the need for long term treatment and the importance of lifestyle modifications.  AGREE: Multiple dietary modification  options and treatment options were discussed and  Kharson agreed to follow the Category 3 plan +200 calories We discussed the following Behavioral Modification Strategies today: increasing lean protein intake, decrease eating out and work on meal planning and easy cooking plans

## 2016-07-12 LAB — COMPREHENSIVE METABOLIC PANEL
A/G RATIO: 2 (ref 1.2–2.2)
ALBUMIN: 4.3 g/dL (ref 3.5–5.5)
ALK PHOS: 72 IU/L (ref 39–117)
ALT: 22 IU/L (ref 0–44)
AST: 16 IU/L (ref 0–40)
BILIRUBIN TOTAL: 0.4 mg/dL (ref 0.0–1.2)
BUN / CREAT RATIO: 19 (ref 9–20)
BUN: 13 mg/dL (ref 6–20)
CHLORIDE: 104 mmol/L (ref 96–106)
CO2: 24 mmol/L (ref 18–29)
Calcium: 9.2 mg/dL (ref 8.7–10.2)
Creatinine, Ser: 0.67 mg/dL — ABNORMAL LOW (ref 0.76–1.27)
GFR calc non Af Amer: 124 mL/min/{1.73_m2} (ref >59)
GFR, EST AFRICAN AMERICAN: 144 mL/min/{1.73_m2} (ref >59)
GLOBULIN, TOTAL: 2.1 g/dL (ref 1.5–4.5)
Glucose: 106 mg/dL — ABNORMAL HIGH (ref 65–99)
POTASSIUM: 4.6 mmol/L (ref 3.5–5.2)
SODIUM: 141 mmol/L (ref 134–144)
TOTAL PROTEIN: 6.4 g/dL (ref 6.0–8.5)

## 2016-07-12 LAB — CBC WITH DIFFERENTIAL
Basophils Absolute: 0 10*3/uL (ref 0.0–0.2)
Basos: 0 %
EOS (ABSOLUTE): 0.1 10*3/uL (ref 0.0–0.4)
EOS: 1 %
HEMATOCRIT: 37 % — AB (ref 37.5–51.0)
Hemoglobin: 12.4 g/dL — ABNORMAL LOW (ref 13.0–17.7)
IMMATURE GRANULOCYTES: 1 %
Immature Grans (Abs): 0.1 10*3/uL (ref 0.0–0.1)
LYMPHS ABS: 2.1 10*3/uL (ref 0.7–3.1)
Lymphs: 27 %
MCH: 27 pg (ref 26.6–33.0)
MCHC: 33.5 g/dL (ref 31.5–35.7)
MCV: 81 fL (ref 79–97)
MONOS ABS: 0.4 10*3/uL (ref 0.1–0.9)
Monocytes: 6 %
NEUTROS PCT: 65 %
Neutrophils Absolute: 5.1 10*3/uL (ref 1.4–7.0)
RBC: 4.59 x10E6/uL (ref 4.14–5.80)
RDW: 14.4 % (ref 12.3–15.4)
WBC: 7.7 10*3/uL (ref 3.4–10.8)

## 2016-07-12 LAB — VITAMIN D 25 HYDROXY (VIT D DEFICIENCY, FRACTURES): Vit D, 25-Hydroxy: 23.8 ng/mL — ABNORMAL LOW (ref 30.0–100.0)

## 2016-07-12 LAB — FOLATE: FOLATE: 16.6 ng/mL (ref >3.0)

## 2016-07-12 LAB — T3: T3, Total: 130 ng/dL (ref 71–180)

## 2016-07-12 LAB — VITAMIN B12: Vitamin B-12: >2000 pg/mL — ABNORMAL HIGH (ref 232–1245)

## 2016-07-12 LAB — HEMOGLOBIN A1C
Est. average glucose Bld gHb Est-mCnc: 131 mg/dL
Hgb A1c MFr Bld: 6.2 % — ABNORMAL HIGH (ref 4.8–5.6)

## 2016-07-12 LAB — T4, FREE: Free T4: 1.11 ng/dL (ref 0.82–1.77)

## 2016-07-12 LAB — LIPID PANEL WITH LDL/HDL RATIO
CHOLESTEROL TOTAL: 119 mg/dL (ref 100–199)
HDL: 37 mg/dL — ABNORMAL LOW (ref >39)
LDL CALC: 63 mg/dL (ref 0–99)
LDL/HDL RATIO: 1.7 ratio (ref 0.0–3.6)
Triglycerides: 97 mg/dL (ref 0–149)
VLDL CHOLESTEROL CAL: 19 mg/dL (ref 5–40)

## 2016-07-12 LAB — TSH: TSH: 2.95 u[IU]/mL (ref 0.450–4.500)

## 2016-07-12 LAB — INSULIN, RANDOM: INSULIN: 36.7 u[IU]/mL — ABNORMAL HIGH (ref 2.6–24.9)

## 2016-07-13 ENCOUNTER — Encounter (HOSPITAL_COMMUNITY): Payer: Self-pay | Admitting: Emergency Medicine

## 2016-07-13 ENCOUNTER — Ambulatory Visit (HOSPITAL_COMMUNITY)
Admission: EM | Admit: 2016-07-13 | Discharge: 2016-07-13 | Disposition: A | Discharge status: Home / Self Care | Payer: 59 | Attending: Internal Medicine | Admitting: Internal Medicine

## 2016-07-13 DIAGNOSIS — H6501 Acute serous otitis media, right ear: Secondary | ICD-10-CM

## 2016-07-13 DIAGNOSIS — J00 Acute nasopharyngitis [common cold]: Secondary | ICD-10-CM

## 2016-07-13 MED ORDER — METHYLPREDNISOLONE 4 MG PO TBPK
ORAL_TABLET | ORAL | 0 refills | Status: DC
Start: 2016-07-13 — End: 2016-09-25

## 2016-07-13 MED ORDER — AMOXICILLIN-POT CLAVULANATE 875-125 MG PO TABS: 1.0000 | ORAL_TABLET | Freq: Two times a day (BID) | ORAL | 0 refills | Status: DC

## 2016-07-13 NOTE — ED Triage Notes (Signed)
Pt c/o cold sx onset 3 days associated w/right ear pain, ST and felt warm  Has not had any meds   A&O x4... NAD... Ambulatory

## 2016-07-13 NOTE — ED Provider Notes (Signed)
CSN: [id number]     Arrival date & time 07/13/16  1831 History   First MD Initiated Contact with Patient 07/13/16 1904     Chief Complaint  Patient presents with  . URI   (Consider location/radiation/quality/duration/timing/severity/associated sxs/prior Treatment) Patient c/o uri and right ear pain for 2 days   The history is provided by the patient.  URI  Presenting symptoms: congestion, ear pain, fatigue and rhinorrhea   Severity:  Moderate Onset quality:  Sudden Duration:  3 days Timing:  Constant Progression:  Worsening Chronicity:  New Relieved by:  Nothing Worsened by:  Nothing   Past Medical History:  Diagnosis Date  . Adenoma    Piturity  . Anemia   . Anxiety   . Anxiety and depression    h/o suicidality w/ citalopram  . Back pain   . Chest pain   . Depression   . Difficult intubation   . ETD (eustachian tube dysfunction)    s/p ENT, declined ear tuves before   . GERD (gastroesophageal reflux disease)   . History of chicken pox    Titered on 08/16/2005  . Hypogonadism male    Dr Cruzita Lederer, used to see urology  . Hypogonadism male   . Infertility male   . Influenza 02/2016  . Internal hemorrhoids    s/p banding  . Low testosterone   . Migraine    on topamax  . OSA on CPAP   . Palpitations   . 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 Prediabetes    A1C 6.2 years ago  . 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 Shortness of breath   . Sleep apnea   . Swelling   . Vitamin B 12 deficiency    h/o  . Vitamin D deficiency    Past Surgical History:  Procedure Laterality Date  . 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 TONSILLECTOMY AND ADENOIDECTOMY     Family History  Problem Relation Age of Onset  . Obesity Mother   . Depression Father   . Anxiety disorder Father   . Bipolar disorder Father   . Drug abuse Father   . Diabetes Paternal Grandmother   . Hypertension  Paternal Grandmother   . Diabetes Paternal Grandfather   . Prostate cancer Other        GF  . CAD Other        PGF  . Colon cancer Neg Hx    Social History  Substance Use Topics  . Smoking status: Never Smoker  . Smokeless tobacco: Never Used  . Alcohol use 0.0 oz/week     Comment: occ    Review of Systems  Constitutional: Positive for fatigue.  HENT: Positive for congestion, ear pain and rhinorrhea.   Eyes: Negative.   Respiratory: Negative.   Cardiovascular: Negative.   Gastrointestinal: Negative.   Endocrine: Negative.   Genitourinary: Negative.   Musculoskeletal: Negative.   Allergic/Immunologic: Negative.   Neurological: Negative.   Hematological: Negative.   Psychiatric/Behavioral: Negative.     Allergies  Levaquin [levofloxacin in d5w]; Celexa [citalopram]; Dextrans; Hydrocodone-acetaminophen; Latex; and Oxycodone  Home Medications   Prior to Admission medications   Medication Sig Start Date End Date Taking? Authorizing Provider  B Complex Vitamins (B COMPLEX-B12 PO) Take 1 tablet by mouth daily.    Yes [provider]  fexofenadine (ALLEGRA) 180 MG tablet Take 180 mg by mouth  daily.   Yes [provider]  fluticasone (FLONASE) 50 MCG/ACT nasal spray Place 2 sprays into both nostrils daily.   Yes [provider]  glucose blood (TRUE METRIX BLOOD GLUCOSE TEST) test strip Check blood sugar no more than twice daily 11/30/15  Yes Paz, Jacqulyn Bath E, MD  glucose blood test strip Use as instructed 07/11/16  Yes Leafy Ro, Caren D, MD  Lancets (ACCU-CHEK SAFE-T PRO) lancets Use as instructed 07/11/16 08/10/16 Yes Beasley, Caren D, MD  Magnesium Oxide (MAG-OXIDE PO) Take 500 mg by mouth daily.   Yes [provider]  metFORMIN (GLUCOPHAGE) 500 MG tablet Take 500 mg by mouth 2 (two) times daily with a meal.   Yes [provider]  metFORMIN (GLUCOPHAGE-XR) 500 MG 24 hr tablet TAKE 2 TABLETS BY MOUTH DAILY WITH BREAKFAST. 05/31/16  Yes  Philemon Kingdom, MD  Multiple Vitamin (MULTIVITAMIN) tablet Take 1 tablet by mouth daily.    Yes [provider]  pantoprazole (PROTONIX) 40 MG tablet Take 1 tablet (40 mg total) by mouth daily. 01/03/16  Yes Paz, Alda Berthold, MD  verapamil (CALAN-SR) 240 MG CR tablet TAKE 1 TABLET BY MOUTH AT BEDTIME 06/27/16  Yes Paz, Alda Berthold, MD  vitamin B-12 (CYANOCOBALAMIN) 250 MCG tablet Take 500 mcg by mouth daily.    Yes [provider]  amoxicillin-clavulanate (AUGMENTIN) 875-125 MG tablet Take 1 tablet by mouth 2 (two) times daily. 07/13/16   Lysbeth Penner, FNP  Calcium Carbonate-Vitamin D (CALTRATE 600+D) 600-400 MG-UNIT per chew tablet Chew 1 tablet by mouth daily. None Chew    [provider]  ketoconazole (NIZORAL) 2 % cream Apply 1 application topically 2 (two) times daily. Patient not taking: Reported on 07/11/2016 05/01/16   Laurey Morale, MD  methylPREDNISolone (MEDROL DOSEPAK) 4 MG TBPK tablet 6-5-4-3-2-1 po qd 07/13/16   Lysbeth Penner, FNP  metoprolol succinate (TOPROL-XL) 25 MG 24 hr tablet Take 25 mg by mouth daily. As needed    [provider]  metoprolol tartrate (LOPRESSOR) 25 MG tablet Take 0.5 tablets (12.5 mg total) by mouth daily as needed (PALPITATIONS). 03/08/16 06/06/16  Leanor Kail, PA   Meds Ordered and Administered this Visit  Medications - No data to display  BP (!) 126/51 (BP Location: Right Arm)   Pulse 88   Temp 98.2 F (36.8 C) (Oral)   Resp 20   SpO2 97%  No data found.   Physical Exam  Constitutional: He is oriented to person, place, and time. He appears well-developed and well-nourished.  HENT:  Head: Normocephalic and atraumatic.  Left Ear: External ear normal.  Nose: Nose normal.  Mouth/Throat: Oropharynx is clear and moist.  Right TM bulging and with erythema  Eyes: Conjunctivae and EOM are normal. Pupils are equal, round, and reactive to light.  Neck: Normal range of motion. Neck supple.  Cardiovascular: Normal  rate, regular rhythm and normal heart sounds.   Pulmonary/Chest: Effort normal and breath sounds normal.  Neurological: He is alert and oriented to person, place, and time.  Nursing note and vitals reviewed.   Urgent Care Course     Procedures (including critical care time)  Labs Review Labs Reviewed - No data to display  Imaging Review No results found.   Visual Acuity Review  Right Eye Distance:   Left Eye Distance:   Bilateral Distance:    Right Eye Near:   Left Eye Near:    Bilateral Near:         MDM  1. Right acute serous otitis media, recurrence not specified   2. Acute nasopharyngitis    Augmentin 875mg  one po bid x 10 days #20 Medrol dose pack as directed #21 Continue flonase  Push po fluids, rest, tylenol and motrin otc prn as directed for fever, arthralgias, and myalgias.  Follow up prn if sx's continue or persist.    Lysbeth Penner, FNP 07/13/16 [phone]

## 2016-07-15 ENCOUNTER — Encounter (INDEPENDENT_AMBULATORY_CARE_PROVIDER_SITE_OTHER): Payer: Self-pay | Admitting: Family Medicine

## 2016-07-17 MED FILL — ACCU-CHEK GUIDE TEST STRIP: 50 days supply | Qty: 50 | Fill #0

## 2016-07-17 MED FILL — ACCU-CHEK FASTCLIX LANCETS: 90 days supply | Qty: 102 | Fill #0

## 2016-07-31 ENCOUNTER — Ambulatory Visit (INDEPENDENT_AMBULATORY_CARE_PROVIDER_SITE_OTHER): Payer: 59 | Admitting: Family Medicine

## 2016-07-31 VITALS — BP 140/70 | HR 70 | Temp 97.8°F | Ht 69.0 in | Wt 368.0 lb

## 2016-07-31 DIAGNOSIS — Z9189 Other specified personal risk factors, not elsewhere classified: Secondary | ICD-10-CM | POA: Diagnosis not present

## 2016-07-31 DIAGNOSIS — E119 Type 2 diabetes mellitus without complications: Secondary | ICD-10-CM

## 2016-07-31 DIAGNOSIS — E559 Vitamin D deficiency, unspecified: Secondary | ICD-10-CM

## 2016-07-31 DIAGNOSIS — R7989 Other specified abnormal findings of blood chemistry: Secondary | ICD-10-CM | POA: Diagnosis not present

## 2016-07-31 MED ORDER — VITAMIN D (ERGOCALCIFEROL) 1.25 MG (50000 UNIT) PO CAPS: 50000.0000 [IU] | ORAL_CAPSULE | ORAL | 0 refills | Status: DC

## 2016-07-31 MED ORDER — LIRAGLUTIDE 18 MG/3ML ~~LOC~~ SOPN: 0.6000 mg | PEN_INJECTOR | SUBCUTANEOUS | 0 refills | Status: DC

## 2016-07-31 MED ORDER — INSULIN PEN NEEDLE 32G X 4 MM MISC: 1.0000 | Freq: Once | 0 refills | Status: DC

## 2016-07-31 MED FILL — TECHLITE PEN NDL 32GX1/4: 32G X 6 MM | 90 days supply | Qty: 100 | Fill #0

## 2016-07-31 MED FILL — VIT D2 1.25 MG (50,000 UNIT: 1.25 MG | 28 days supply | Qty: 4 | Fill #0

## 2016-07-31 NOTE — Progress Notes (Signed)
Office: [phone]  /  Fax: [phone]   HPI:   Chief Complaint: OBESITY Julian Washington is here to discuss his progress with his obesity treatment plan. He is on the  follow the Category 3 plan +200 calories and is following his eating plan approximately 85 to 90 % of the time. He states he is exercising 0 minutes 0 times per week. Cha lost a significant amount of weight in the last 2 weeks on 1700 calorie diet. He has lost a lot of fluid in his legs and can wear sandals which was impossible previously. Hunger is mostly controlled on diet prescription but he is especially bored with breakfast options. His weight is (!) 368 lb (166.9 kg) today and has had a weight loss of 28 pounds over a period of 3 weeks since his last visit. He has lost 28 lbs since starting treatment with [place].  Elevated B12 Julian Washington is currently on OTC B12 5,000 units daily and level is over replaced. Julian Washington still notes fatigue.  Vitamin D deficiency Julian Washington has a diagnosis of vitamin D deficiency. He is not currently taking OTC vit D. He admits fatigue and denies nausea, vomiting or muscle weakness. Julian Washington is at risk of osteopenia due to low testosterone level.  At risk for osteopenia Julian Washington is at higher risk of osteopenia and osteoporosis due to vitamin D deficiency.   Diabetes II Julian Washington has a diagnosis of diabetes type II. Last A1c was at 6.2 on metformin and he has had 2 fasting glucose readings over 126 in the last 2 weeks which is indicative of DMII versus pre-diabetes. He admits polyphagia. Julian Washington has been working on intensive lifestyle modifications including diet, exercise, and weight loss to help control his blood glucose levels.  ALLERGIES: Allergies  Allergen Reactions  . Levaquin [Levofloxacin In D5w] Swelling  . Celexa [Citalopram] Other (See Comments)    Mental status changes   . Dextrans Other (See Comments)    Makes the pt. Drowsy.   . Hydrocodone-Acetaminophen     REACTION: itching  . Latex   .  Oxycodone Itching    MEDICATIONS: Current Outpatient Prescriptions on File Prior to Visit  Medication Sig Dispense Refill  . B Complex Vitamins (B COMPLEX-B12 PO) Take 1 tablet by mouth daily.     . Calcium Carbonate-Vitamin D (CALTRATE 600+D) 600-400 MG-UNIT per chew tablet Chew 1 tablet by mouth daily. None Chew    . fexofenadine (ALLEGRA) 180 MG tablet Take 180 mg by mouth daily.    . fluticasone (FLONASE) 50 MCG/ACT nasal spray Place 2 sprays into both nostrils daily.    Marland Kitchen glucose blood (TRUE METRIX BLOOD GLUCOSE TEST) test strip Check blood sugar no more than twice daily 100 each 12  . glucose blood test strip Use as instructed 100 each 0  . ketoconazole (NIZORAL) 2 % cream Apply 1 application topically 2 (two) times daily. 30 g 0  . Lancets (ACCU-CHEK SAFE-T PRO) lancets Use as instructed 1 each 0  . Magnesium Oxide (MAG-OXIDE PO) Take 500 mg by mouth daily.    . metFORMIN (GLUCOPHAGE) 500 MG tablet Take 500 mg by mouth 2 (two) times daily with a meal.    . metFORMIN (GLUCOPHAGE-XR) 500 MG 24 hr tablet TAKE 2 TABLETS BY MOUTH DAILY WITH BREAKFAST. 180 tablet 0  . metoprolol succinate (TOPROL-XL) 25 MG 24 hr tablet Take 25 mg by mouth daily. As needed    . Multiple Vitamin (MULTIVITAMIN) tablet Take 1 tablet by mouth daily.     Marland Kitchen  pantoprazole (PROTONIX) 40 MG tablet Take 1 tablet (40 mg total) by mouth daily. 90 tablet 3  . verapamil (CALAN-SR) 240 MG CR tablet TAKE 1 TABLET BY MOUTH AT BEDTIME 90 tablet 1  . vitamin B-12 (CYANOCOBALAMIN) 250 MCG tablet Take 500 mcg by mouth daily.     Marland Kitchen amoxicillin-clavulanate (AUGMENTIN) 875-125 MG tablet Take 1 tablet by mouth 2 (two) times daily. (Patient not taking: Reported on 07/31/2016) 20 tablet 0  . methylPREDNISolone (MEDROL DOSEPAK) 4 MG TBPK tablet 6-5-4-3-2-1 po qd (Patient not taking: Reported on 07/31/2016) 21 tablet 0  . metoprolol tartrate (LOPRESSOR) 25 MG tablet Take 0.5 tablets (12.5 mg total) by mouth daily as needed (PALPITATIONS).  30 tablet 6   No current facility-administered medications on file prior to visit.     PAST MEDICAL HISTORY: Past Medical History:  Diagnosis Date  . Adenoma    Piturity  . Anemia   . Anxiety   . Anxiety and depression    h/o suicidality w/ citalopram  . Back pain   . Chest pain   . Depression   . Difficult intubation   . ETD (eustachian tube dysfunction)    s/p ENT, declined ear tuves before   . GERD (gastroesophageal reflux disease)   . History of chicken pox    Titered on 08/16/2005  . Hypogonadism male    Dr Cruzita Lederer, used to see urology  . Hypogonadism male   . Infertility male   . Influenza 02/2016  . Internal hemorrhoids    s/p banding  . Low testosterone   . Migraine    on topamax  . OSA on CPAP   . Palpitations   . 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 Prediabetes    A1C 6.2 years ago  . 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 Shortness of breath   . Sleep apnea   . Swelling   . Vitamin B 12 deficiency    h/o  . Vitamin D deficiency     PAST SURGICAL HISTORY: Past Surgical History:  Procedure Laterality Date  . 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 TONSILLECTOMY AND ADENOIDECTOMY      SOCIAL HISTORY: Social History  Substance Use Topics  . Smoking status: Never Smoker  . Smokeless tobacco: Never Used  . Alcohol use 0.0 oz/week     Comment: occ    FAMILY HISTORY: Family History  Problem Relation Age of Onset  . Obesity Mother   . Depression Father   . Anxiety disorder Father   . Bipolar disorder Father   . Drug abuse Father   . Diabetes Paternal Grandmother   . Hypertension Paternal Grandmother   . Diabetes Paternal Grandfather   . Prostate cancer Other        GF  . CAD Other        PGF  . Colon cancer Neg Hx     ROS: Review of Systems  Constitutional: Positive for malaise/fatigue and weight loss.  Gastrointestinal: Negative for nausea and  vomiting.  Musculoskeletal:       Negative muscle weakness  Endo/Heme/Allergies:       Polyphagia    PHYSICAL EXAM: Blood pressure 140/70, pulse 70, temperature 97.8 F (36.6 C), temperature source Oral, height 5\' 9"  (1.753 m), weight (!) 368 lb (166.9 kg), SpO2 98 %. Body mass index is 54.34 kg/m. Physical Exam  Constitutional: He is oriented  to person, place, and time. He appears well-developed and well-nourished.  Cardiovascular: Normal rate.   Pulmonary/Chest: Effort normal.  Musculoskeletal: Normal range of motion.  Neurological: He is oriented to person, place, and time.  Skin: Skin is warm and dry.  Psychiatric: He has a normal mood and affect. His behavior is normal.  Vitals reviewed.   RECENT LABS AND TESTS: BMET    Component Value Date/Time   NA 141 07/11/2016 1018   K 4.6 07/11/2016 1018   CL 104 07/11/2016 1018   CO2 24 07/11/2016 1018   GLUCOSE 106 (H) 07/11/2016 1018   GLUCOSE 153 (H) 05/06/2016 1126   BUN 13 07/11/2016 1018   CREATININE 0.67 (L) 07/11/2016 1018   CREATININE 0.73 01/19/2016 1624   CALCIUM 9.2 07/11/2016 1018   GFRNONAA 124 07/11/2016 1018   GFRAA 144 07/11/2016 1018   Lab Results  Component Value Date   HGBA1C 6.2 (H) 07/11/2016   HGBA1C 5.7 01/10/2016   HGBA1C 5.8 07/06/2015   HGBA1C 6.2 01/25/2015   HGBA1C 5.9 07/08/2014   Lab Results  Component Value Date   INSULIN 36.7 (H) 07/11/2016   CBC    Component Value Date/Time   WBC 7.7 07/11/2016 1018   WBC 8.6 05/06/2016 1126   RBC 4.59 07/11/2016 1018   RBC 4.73 05/06/2016 1126   HGB 12.4 (L) 07/11/2016 1018   HCT 37.0 (L) 07/11/2016 1018   PLT 197.0 05/06/2016 1126   MCV 81 07/11/2016 1018   MCH 27.0 07/11/2016 1018   MCH 27.4 02/05/2016 1730   MCHC 33.5 07/11/2016 1018   MCHC 33.1 05/06/2016 1126   RDW 14.4 07/11/2016 1018   LYMPHSABS 2.1 07/11/2016 1018   MONOABS 0.5 05/06/2016 1126   EOSABS 0.1 07/11/2016 1018   BASOSABS 0.0 07/11/2016 1018   Iron/TIBC/Ferritin/  %Sat    Component Value Date/Time   IRON 48 07/26/2015 1030   FERRITIN 79.2 07/26/2015 1030   IRONPCTSAT 13.8 (L) 07/08/2014 0848   Lipid Panel     Component Value Date/Time   CHOL 119 07/11/2016 1018   TRIG 97 07/11/2016 1018   HDL 37 (L) 07/11/2016 1018   CHOLHDL 3 07/26/2015 1030   VLDL 12.0 07/26/2015 1030   LDLCALC 63 07/11/2016 1018   Hepatic Function Panel     Component Value Date/Time   PROT 6.4 07/11/2016 1018   ALBUMIN 4.3 07/11/2016 1018   AST 16 07/11/2016 1018   ALT 22 07/11/2016 1018   ALKPHOS 72 07/11/2016 1018   BILITOT 0.4 07/11/2016 1018      Component Value Date/Time   TSH 2.950 07/11/2016 1018   TSH 2.275 02/05/2016 1730   TSH 1.27 01/10/2016 0835   TSH 2.71 07/08/2014 0848    ASSESSMENT AND PLAN: Vitamin D deficiency - Plan: Vitamin D, Ergocalciferol, (DRISDOL) 50000 units CAPS capsule  Type 2 diabetes mellitus without complication, without long-term current use of insulin (HCC) - Plan: liraglutide (VICTOZA) 18 MG/3ML SOPN, Insulin Pen Needle 32G X 4 MM MISC  High serum vitamin B12  At risk for osteoporosis  Morbid obesity (HCC)  PLAN:  Elevated B12 Julian Washington agrees to decrease B12 to 5,000 units 2 times per week and we will re-check labs in 3 months. He agrees to continue with high B12 diet in the meantime and follow up with our clinic in 3 weeks.  Vitamin D Deficiency Julian Washington was informed that low vitamin D levels contributes to fatigue and are associated with obesity, breast, and colon cancer. He agrees to start  to take prescription Vit D @50 ,000 IU every week #4 with no refills and will follow up for routine testing of vitamin D, at least 2-3 times per year. He was informed of the risk of over-replacement of vitamin D and agrees to not increase his dose unless he discusses this with [place] first. Julian Washington agrees to follow up with our clinic in 3 weeks.  At risk for osteopenia Julian Washington is at risk for osteopenia and osteoporsis due to his vitamin D  deficiency. He was encouraged to take his vitamin D and follow his higher calcium diet and increase strengthening exercise to help strengthen his bones and decrease his risk of osteopenia and osteoporosis.  Diabetes II Julian Washington has been given extensive diabetes education by myself today including ideal fasting and post-prandial blood glucose readings, individual ideal Hgb A1c goals  and hypoglycemia prevention. We discussed the importance of good blood sugar control to decrease the likelihood of diabetic complications such as nephropathy, neuropathy, limb loss, blindness, coronary artery disease, and death. We discussed the importance of intensive lifestyle modification including diet, exercise and weight loss as the first line treatment for diabetes. Julian Washington agrees to start victoza 0.6 mg every morning #1 pen with no refills and nano needles #100 with no refills and will continue metformin as prescribed and will follow up at the agreed upon time.  Diabetes II Julian Washington has been given extensive diabetes education by myself today including ideal fasting and post-prandial blood glucose readings, individual ideal Hgb A1c goals  and hypoglycemia prevention. We discussed the importance of good blood sugar control to decrease the likelihood of diabetic complications such as nephropathy, neuropathy, limb loss, blindness, coronary artery disease, and death. We discussed the importance of intensive lifestyle modification including diet, exercise and weight loss as the first line treatment for diabetes. Julian Washington agrees to continue his diabetes medications and will follow up at the agreed upon time.  Obesity Julian Washington is currently in the action stage of change. As such, his goal is to continue with weight loss efforts He has agreed to keep a food journal with 400 to 650 calories and 35+ grams of protein at breakfast and follow the Category 3 plan [place] has been instructed to work up to a goal of 150 minutes of combined  cardio and strengthening exercise per week for weight loss and overall health benefits. We discussed the following Behavioral Modification Strategies today: increase H2O intake, meal planning & cooking strategies, increasing lean protein intake and holiday eating strategies   Julian Washington has agreed to follow up with our clinic in 3 weeks. He was informed of the importance of frequent follow up visits to maximize his success with intensive lifestyle modifications for his multiple health conditions.  I, Doreene Nest, am acting as transcriptionist for Dennard Nip, MD  I have reviewed the above documentation for accuracy and completeness, and I agree with the above. -Dennard Nip, MD  OBESITY BEHAVIORAL INTERVENTION VISIT  Today's visit was # 2 out of 45.  Starting weight: 396 lbs Starting date: 07/11/16 Today's weight : 368 lbs  Today's date: 07/31/2016 Total lbs lost to date: 32 (Patients must lose 7 lbs in the first 6 months to continue with counseling)   ASK: We discussed the diagnosis of obesity with Julian Washington today and Julian Washington agreed to give [place] permission to discuss obesity behavioral modification therapy today.  ASSESS: Julian Washington has the diagnosis of obesity and his BMI today is 69.5 Julian Washington is in the action stage of change  ADVISE: Julian Washington was educated on the multiple health risks of obesity as well as the benefit of weight loss to improve his health. He was advised of the need for long term treatment and the importance of lifestyle modifications.  AGREE: Multiple dietary modification options and treatment options were discussed and  Julian Washington agreed to keep a food journal with 400 to 650 calories and 35+ grams of protein at breakfast and follow the Category 3 plan We discussed the following Behavioral Modification Strategies today: increase H2O intake, meal planning & cooking strategies, increasing lean protein intake and holiday eating strategies

## 2016-08-13 ENCOUNTER — Encounter: Payer: Self-pay | Admitting: Internal Medicine

## 2016-08-16 ENCOUNTER — Encounter: Payer: 59 | Admitting: Internal Medicine

## 2016-08-21 ENCOUNTER — Ambulatory Visit (INDEPENDENT_AMBULATORY_CARE_PROVIDER_SITE_OTHER): Payer: 59 | Admitting: Family Medicine

## 2016-08-21 VITALS — BP 122/75 | HR 79 | Temp 98.9°F | Ht 69.0 in | Wt 357.0 lb

## 2016-08-21 DIAGNOSIS — E119 Type 2 diabetes mellitus without complications: Secondary | ICD-10-CM | POA: Diagnosis not present

## 2016-08-21 DIAGNOSIS — IMO0001 Reserved for inherently not codable concepts without codable children: Secondary | ICD-10-CM

## 2016-08-21 DIAGNOSIS — E559 Vitamin D deficiency, unspecified: Secondary | ICD-10-CM

## 2016-08-21 DIAGNOSIS — E669 Obesity, unspecified: Secondary | ICD-10-CM

## 2016-08-21 DIAGNOSIS — Z6841 Body Mass Index (BMI) 40.0 and over, adult: Secondary | ICD-10-CM | POA: Diagnosis not present

## 2016-08-21 MED ORDER — GLUCOSE BLOOD VI STRP: ORAL_STRIP | 0 refills | Status: DC

## 2016-08-21 MED ORDER — FREESTYLE LANCETS MISC: 12 refills | Status: DC

## 2016-08-21 MED ORDER — ACCU-CHEK SAFE-T PRO LANCETS MISC: 0 refills | Status: DC

## 2016-08-21 MED ORDER — VITAMIN D (ERGOCALCIFEROL) 1.25 MG (50000 UNIT) PO CAPS: 50000.0000 [IU] | ORAL_CAPSULE | ORAL | 0 refills | Status: DC

## 2016-08-21 NOTE — Progress Notes (Signed)
Office: [phone]  /  Fax: [phone]   HPI:   Chief Complaint: OBESITY Julian Washington is here to discuss his progress with his obesity treatment plan. He is on the keep a food journal with 400 to 650 calories and 35+ grams of protein at breakfast daily and follow the Category 3 plan and is following his eating plan approximately 70 % of the time. He states he is exercising 0 minutes 0 times per week. Julian Washington continues to do very well with weight loss. He has eaten out some but not often and is making better choices. Hunger is controlled. He is on Saxenda 0.6 mg and notes some queasiness when he eats higher calorie/carbohydrate foods. His weight is (!) 357 lb (161.9 kg) today and has had a weight loss of 11 pounds over a period of 3 weeks since his last visit. He has lost 39 lbs since starting treatment with [place].  Vitamin D deficiency Julian Washington has a diagnosis of vitamin D deficiency. He is currently stable on vit D, not yet at goal and denies nausea, vomiting or muscle weakness.  Diabetes II Julian Washington has a diagnosis of diabetes type II. Julian Washington states fasting BGs range between 94 and 110 and denies any hypoglycemic episodes. He has been working on intensive lifestyle modifications including diet, exercise, and weight loss to help control his blood glucose levels.  ALLERGIES: Allergies  Allergen Reactions  . Levaquin [Levofloxacin In D5w] Swelling  . Celexa [Citalopram] Other (See Comments)    Mental status changes   . Dextrans Other (See Comments)    Makes the pt. Drowsy.   . Hydrocodone-Acetaminophen     REACTION: itching  . Latex   . Oxycodone Itching    MEDICATIONS: Current Outpatient Prescriptions on File Prior to Visit  Medication Sig Dispense Refill  . B Complex Vitamins (B COMPLEX-B12 PO) Take 1 tablet by mouth daily.     . Calcium Carbonate-Vitamin D (CALTRATE 600+D) 600-400 MG-UNIT per chew tablet Chew 1 tablet by mouth daily. None Chew    . fexofenadine (ALLEGRA) 180 MG tablet  Take 180 mg by mouth daily.    . fluticasone (FLONASE) 50 MCG/ACT nasal spray Place 2 sprays into both nostrils daily.    Marland Kitchen ketoconazole (NIZORAL) 2 % cream Apply 1 application topically 2 (two) times daily. 30 g 0  . liraglutide (VICTOZA) 18 MG/3ML SOPN Inject 0.1 mLs (0.6 mg total) into the skin every morning. 1 pen 0  . Magnesium Oxide (MAG-OXIDE PO) Take 500 mg by mouth daily.    . metFORMIN (GLUCOPHAGE) 500 MG tablet Take 500 mg by mouth 2 (two) times daily with a meal.    . metFORMIN (GLUCOPHAGE-XR) 500 MG 24 hr tablet TAKE 2 TABLETS BY MOUTH DAILY WITH BREAKFAST. 180 tablet 0  . methylPREDNISolone (MEDROL DOSEPAK) 4 MG TBPK tablet 6-5-4-3-2-1 po qd 21 tablet 0  . metoprolol succinate (TOPROL-XL) 25 MG 24 hr tablet Take 25 mg by mouth daily. As needed    . Multiple Vitamin (MULTIVITAMIN) tablet Take 1 tablet by mouth daily.     . pantoprazole (PROTONIX) 40 MG tablet Take 1 tablet (40 mg total) by mouth daily. 90 tablet 3  . verapamil (CALAN-SR) 240 MG CR tablet TAKE 1 TABLET BY MOUTH AT BEDTIME 90 tablet 1  . vitamin B-12 (CYANOCOBALAMIN) 250 MCG tablet Take 500 mcg by mouth daily.     . metoprolol tartrate (LOPRESSOR) 25 MG tablet Take 0.5 tablets (12.5 mg total) by mouth daily as needed (PALPITATIONS). 30 tablet  6   No current facility-administered medications on file prior to visit.     PAST MEDICAL HISTORY: Past Medical History:  Diagnosis Date  . Adenoma    Piturity  . Anemia   . Anxiety   . Anxiety and depression    h/o suicidality w/ citalopram  . Back pain   . Chest pain   . Depression   . Difficult intubation   . ETD (eustachian tube dysfunction)    s/p ENT, declined ear tuves before   . GERD (gastroesophageal reflux disease)   . History of chicken pox    Titered on 08/16/2005  . Hypogonadism male    Dr Cruzita Lederer, used to see urology  . Hypogonadism male   . Infertility male   . Influenza 02/2016  . Internal hemorrhoids    s/p banding  . Low testosterone   .  Migraine    on topamax  . OSA on CPAP   . Palpitations   . 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 Prediabetes    A1C 6.2 years ago  . 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 Shortness of breath   . Sleep apnea   . Swelling   . Vitamin B 12 deficiency    h/o  . Vitamin D deficiency     PAST SURGICAL HISTORY: Past Surgical History:  Procedure Laterality Date  . 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 TONSILLECTOMY AND ADENOIDECTOMY      SOCIAL HISTORY: Social History  Substance Use Topics  . Smoking status: Never Smoker  . Smokeless tobacco: Never Used  . Alcohol use 0.0 oz/week     Comment: occ    FAMILY HISTORY: Family History  Problem Relation Age of Onset  . Obesity Mother   . Depression Father   . Anxiety disorder Father   . Bipolar disorder Father   . Drug abuse Father   . Diabetes Paternal Grandmother   . Hypertension Paternal Grandmother   . Diabetes Paternal Grandfather   . Prostate cancer Other        GF  . CAD Other        PGF  . Colon cancer Neg Hx     ROS: Review of Systems  Constitutional: Positive for weight loss.  Gastrointestinal: Negative for nausea and vomiting.  Musculoskeletal:       Negative muscle weakness  Endo/Heme/Allergies:       Negative hypoglycemia    PHYSICAL EXAM: Blood pressure 122/75, pulse 79, temperature 98.9 F (37.2 C), temperature source Oral, height 5\' 9"  (1.753 m), weight (!) 357 lb (161.9 kg), SpO2 95 %. Body mass index is 52.72 kg/m. Physical Exam  Constitutional: He is oriented to person, place, and time. He appears well-developed and well-nourished.  Cardiovascular: Normal rate.   Pulmonary/Chest: Effort normal.  Musculoskeletal: Normal range of motion.  Neurological: He is oriented to person, place, and time.  Skin: Skin is warm and dry.  Psychiatric: He has a normal mood and affect. His behavior is normal.  Vitals  reviewed.   RECENT LABS AND TESTS: BMET    Component Value Date/Time   NA 141 07/11/2016 1018   K 4.6 07/11/2016 1018   CL 104 07/11/2016 1018   CO2 24 07/11/2016 1018   GLUCOSE 106 (H) 07/11/2016 1018   GLUCOSE 153 (H) 05/06/2016 1126   BUN 13 07/11/2016 1018   CREATININE 0.67 (L) 07/11/2016  1018   CREATININE 0.73 01/19/2016 1624   CALCIUM 9.2 07/11/2016 1018   GFRNONAA 124 07/11/2016 1018   GFRAA 144 07/11/2016 1018   Lab Results  Component Value Date   HGBA1C 6.2 (H) 07/11/2016   HGBA1C 5.7 01/10/2016   HGBA1C 5.8 07/06/2015   HGBA1C 6.2 01/25/2015   HGBA1C 5.9 07/08/2014   Lab Results  Component Value Date   INSULIN 36.7 (H) 07/11/2016   CBC    Component Value Date/Time   WBC 7.7 07/11/2016 1018   WBC 8.6 05/06/2016 1126   RBC 4.59 07/11/2016 1018   RBC 4.73 05/06/2016 1126   HGB 12.4 (L) 07/11/2016 1018   HCT 37.0 (L) 07/11/2016 1018   PLT 197.0 05/06/2016 1126   MCV 81 07/11/2016 1018   MCH 27.0 07/11/2016 1018   MCH 27.4 02/05/2016 1730   MCHC 33.5 07/11/2016 1018   MCHC 33.1 05/06/2016 1126   RDW 14.4 07/11/2016 1018   LYMPHSABS 2.1 07/11/2016 1018   MONOABS 0.5 05/06/2016 1126   EOSABS 0.1 07/11/2016 1018   BASOSABS 0.0 07/11/2016 1018   Iron/TIBC/Ferritin/ %Sat    Component Value Date/Time   IRON 48 07/26/2015 1030   FERRITIN 79.2 07/26/2015 1030   IRONPCTSAT 13.8 (L) 07/08/2014 0848   Lipid Panel     Component Value Date/Time   CHOL 119 07/11/2016 1018   TRIG 97 07/11/2016 1018   HDL 37 (L) 07/11/2016 1018   CHOLHDL 3 07/26/2015 1030   VLDL 12.0 07/26/2015 1030   LDLCALC 63 07/11/2016 1018   Hepatic Function Panel     Component Value Date/Time   PROT 6.4 07/11/2016 1018   ALBUMIN 4.3 07/11/2016 1018   AST 16 07/11/2016 1018   ALT 22 07/11/2016 1018   ALKPHOS 72 07/11/2016 1018   BILITOT 0.4 07/11/2016 1018      Component Value Date/Time   TSH 2.950 07/11/2016 1018   TSH 2.275 02/05/2016 1730   TSH 1.27 01/10/2016 0835    TSH 2.71 07/08/2014 0848    ASSESSMENT AND PLAN: Type 2 diabetes mellitus without complication, without long-term current use of insulin (HCC) - Plan: Lancets (ACCU-CHEK SAFE-T PRO) lancets, glucose blood (ACCU-CHEK ACTIVE STRIPS) test strip, DISCONTINUED: glucose blood (TRUE METRIX BLOOD GLUCOSE TEST) test strip, DISCONTINUED: Lancets (FREESTYLE) lancets  Vitamin D deficiency - Plan: Vitamin D, Ergocalciferol, (DRISDOL) 50000 units CAPS capsule  Class 3 obesity with serious comorbidity and body mass index (BMI) of 50.0 to 59.9 in adult, unspecified obesity type (HCC)  PLAN:  Vitamin D Deficiency Julian Washington was informed that low vitamin D levels contributes to fatigue and are associated with obesity, breast, and colon cancer. He agrees to continue to take prescription Vit D @50 ,000 IU every week, we will refill for 1 month and will follow up for routine testing of vitamin D, at least 2-3 times per year. He was informed of the risk of over-replacement of vitamin D and agrees to not increase his dose unless he discusses this with [place] first. Julian Washington agrees to follow up with our clinic in 3 weeks.  Diabetes II Julian Washington has been given extensive diabetes education by myself today including ideal fasting and post-prandial blood glucose readings, individual ideal Hgb A1c goals  and hypoglycemia prevention. We discussed the importance of good blood sugar control to decrease the likelihood of diabetic complications such as nephropathy, neuropathy, limb loss, blindness, coronary artery disease, and death. We discussed the importance of intensive lifestyle modification including diet, exercise and weight loss as the first line  treatment for diabetes. Julian Washington agrees to continue his diabetes medications, we will refill strips and lancets and he will follow up at the agreed upon time.  Obesity Julian Washington is currently in the action stage of change. As such, his goal is to continue with weight loss efforts He has agreed to  keep a food journal with 400 to 650 calories and 35+ grams of protein  and follow the Category 3 plan Julian Washington has been instructed to work up to a goal of 150 minutes of combined cardio and strengthening exercise per week for weight loss and overall health benefits. We discussed the following Behavioral Modification Strategies today: meal planning & cooking strategies, increasing lean protein intake and dealing with family or coworker sabotage  Julian Washington has agreed to follow up with our clinic in 3 weeks. He was informed of the importance of frequent follow up visits to maximize his success with intensive lifestyle modifications for his multiple health conditions.  I, Doreene Nest, am acting as transcriptionist for Dennard Nip, MD  I have reviewed the above documentation for accuracy and completeness, and I agree with the above. -Dennard Nip, MD    OBESITY BEHAVIORAL INTERVENTION VISIT  Today's visit was # 3 out of 22.  Starting weight: 396 lbs Starting date: 07/11/16 Today's weight : 357 lbs Today's date: 08/21/2016 Total lbs lost to date: 45 (Patients must lose 7 lbs in the first 6 months to continue with counseling)   ASK: We discussed the diagnosis of obesity with Julian Washington today and Julian Washington agreed to give [place] permission to discuss obesity behavioral modification therapy today.  ASSESS: Julian Washington has the diagnosis of obesity and his BMI today is 52.8 Julian Washington is in the action stage of change   ADVISE: Julian Washington was educated on the multiple health risks of obesity as well as the benefit of weight loss to improve his health. He was advised of the need for long term treatment and the importance of lifestyle modifications.  AGREE: Multiple dietary modification options and treatment options were discussed and  Julian Washington agreed to keep a food journal with 400 to 650 calories and 35+ grams of protein at breakfast daily and follow the Category 3 plan We discussed the following Behavioral  Modification Strategies today: meal planning & cooking strategies, increasing lean protein intake and dealing with family or coworker sabotage

## 2016-08-26 ENCOUNTER — Encounter (INDEPENDENT_AMBULATORY_CARE_PROVIDER_SITE_OTHER): Payer: Self-pay | Admitting: Family Medicine

## 2016-09-02 ENCOUNTER — Other Ambulatory Visit: Payer: Self-pay | Admitting: Internal Medicine

## 2016-09-02 MED FILL — METFORMIN HCL ER 500 MG TAB: 500 | 90 days supply | Qty: 180 | Fill #0

## 2016-09-02 MED FILL — ACCU-CHEK GUIDE TEST STRIP: 50 days supply | Qty: 100 | Fill #0

## 2016-09-02 MED FILL — VIT D2 1.25 MG (50,000 UNIT: 1.25 MG | 28 days supply | Qty: 4 | Fill #0

## 2016-09-11 ENCOUNTER — Ambulatory Visit (INDEPENDENT_AMBULATORY_CARE_PROVIDER_SITE_OTHER): Payer: 59 | Admitting: Family Medicine

## 2016-09-11 VITALS — BP 123/79 | HR 83 | Temp 98.1°F | Ht 69.0 in | Wt 355.0 lb

## 2016-09-11 DIAGNOSIS — I10 Essential (primary) hypertension: Secondary | ICD-10-CM | POA: Diagnosis not present

## 2016-09-11 DIAGNOSIS — E669 Obesity, unspecified: Secondary | ICD-10-CM

## 2016-09-11 DIAGNOSIS — E119 Type 2 diabetes mellitus without complications: Secondary | ICD-10-CM

## 2016-09-11 DIAGNOSIS — Z6841 Body Mass Index (BMI) 40.0 and over, adult: Secondary | ICD-10-CM | POA: Diagnosis not present

## 2016-09-11 DIAGNOSIS — IMO0001 Reserved for inherently not codable concepts without codable children: Secondary | ICD-10-CM

## 2016-09-11 DIAGNOSIS — Z9189 Other specified personal risk factors, not elsewhere classified: Secondary | ICD-10-CM

## 2016-09-11 NOTE — Progress Notes (Signed)
Office: [phone]  /  Fax: [phone]   HPI:   Chief Complaint: OBESITY Julian Washington is here to discuss his progress with his obesity treatment plan. He is on the  keep a food journal with 400 to 650 calories and 35+ grams of protein  and follow the Category 3 plan and is following his eating plan approximately 50 to 60 % of the time. He states he is exercising 0 minutes 0 times per week. Julian Washington continues to do well with weight loss but has had extra challenges due to vacation and eating out. He tried to increase Saxenda but had palpitations again and had to cut back to 0.6 mg. Julian Washington notes decreased polyphagia. His weight is (!) 355 lb (161 kg) today and has had a weight loss of 2 pounds over a period of 3 weeks since his last visit. He has lost 41 lbs since starting treatment with [place].  Hypertension Julian Washington is a 36 y.o. male with hypertension. His blood pressure is stable on verapamil. He takes metoprolol occasionally when he has migraines. He notes palpitations with Saxenda and wonders if this would improve if he takes his metoprolol daily. Julian Washington denies chest pain or shortness of breath on exertion. He is working weight loss to help control his blood pressure with the goal of decreasing his risk of heart attack and stroke. Julian Washington blood pressure is currently controlled.  Diabetes II Julian Washington has a diagnosis of diabetes type II and is on Liraglutide and doing well. Julian Washington states fasting BGs range between 95 and 105 and 2 hour post prandial range between 100 and 120 and he denies nausea, vomiting or any hypoglycemic episodes. He has been working on intensive lifestyle modifications including diet, exercise, and weight loss to help control his blood glucose levels. Julian Washington is doing well with diabetic diet and weight loss efforts.  At risk for cardiovascular disease Julian Washington is at a higher than average risk for cardiovascular disease due to obesity, hypertension and diabetes. He currently  denies any chest pain.   ALLERGIES: Allergies  Allergen Reactions  . Levaquin [Levofloxacin In D5w] Swelling  . Celexa [Citalopram] Other (See Comments)    Mental status changes   . Dextrans Other (See Comments)    Makes the pt. Drowsy.   . Hydrocodone-Acetaminophen     REACTION: itching  . Latex   . Oxycodone Itching    MEDICATIONS: Current Outpatient Prescriptions on File Prior to Visit  Medication Sig Dispense Refill  . B Complex Vitamins (B COMPLEX-B12 PO) Take 1 tablet by mouth daily.     . Calcium Carbonate-Vitamin D (CALTRATE 600+D) 600-400 MG-UNIT per chew tablet Chew 1 tablet by mouth daily. None Chew    . fexofenadine (ALLEGRA) 180 MG tablet Take 180 mg by mouth daily.    . fluticasone (FLONASE) 50 MCG/ACT nasal spray Place 2 sprays into both nostrils daily.    Marland Kitchen glucose blood (ACCU-CHEK ACTIVE STRIPS) test strip Use as instructed twice daily 100 each 0  . ketoconazole (NIZORAL) 2 % cream Apply 1 application topically 2 (two) times daily. 30 g 0  . Lancets (ACCU-CHEK SAFE-T PRO) lancets Use as instructed twice daily 100 each 0  . liraglutide (VICTOZA) 18 MG/3ML SOPN Inject 0.1 mLs (0.6 mg total) into the skin every morning. 1 pen 0  . Magnesium Oxide (MAG-OXIDE PO) Take 500 mg by mouth daily.    . metFORMIN (GLUCOPHAGE) 500 MG tablet Take 500 mg by mouth 2 (two) times daily with a  meal.    . metFORMIN (GLUCOPHAGE-XR) 500 MG 24 hr tablet TAKE 2 TABLETS BY MOUTH DAILY WITH BREAKFAST. 180 tablet 0  . methylPREDNISolone (MEDROL DOSEPAK) 4 MG TBPK tablet 6-5-4-3-2-1 po qd 21 tablet 0  . metoprolol succinate (TOPROL-XL) 25 MG 24 hr tablet Take 25 mg by mouth daily. As needed    . Multiple Vitamin (MULTIVITAMIN) tablet Take 1 tablet by mouth daily.     . pantoprazole (PROTONIX) 40 MG tablet Take 1 tablet (40 mg total) by mouth daily. 90 tablet 3  . verapamil (CALAN-SR) 240 MG CR tablet TAKE 1 TABLET BY MOUTH AT BEDTIME 90 tablet 1  . vitamin B-12 (CYANOCOBALAMIN) 250 MCG tablet  Take 500 mcg by mouth daily.     . Vitamin D, Ergocalciferol, (DRISDOL) 50000 units CAPS capsule Take 1 capsule (50,000 Units total) by mouth every 7 (seven) days. 4 capsule 0  . metoprolol tartrate (LOPRESSOR) 25 MG tablet Take 0.5 tablets (12.5 mg total) by mouth daily as needed (PALPITATIONS). 30 tablet 6   No current facility-administered medications on file prior to visit.     PAST MEDICAL HISTORY: Past Medical History:  Diagnosis Date  . Adenoma    Piturity  . Anemia   . Anxiety   . Anxiety and depression    h/o suicidality w/ citalopram  . Back pain   . Chest pain   . Depression   . Difficult intubation   . ETD (eustachian tube dysfunction)    s/p ENT, declined ear tuves before   . GERD (gastroesophageal reflux disease)   . History of chicken pox    Titered on 08/16/2005  . Hypogonadism male    Dr Cruzita Lederer, used to see urology  . Hypogonadism male   . Infertility male   . Influenza 02/2016  . Internal hemorrhoids    s/p banding  . Low testosterone   . Migraine    on topamax  . OSA on CPAP   . Palpitations   . 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 Prediabetes    A1C 6.2 years ago  . 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 Shortness of breath   . Sleep apnea   . Swelling   . Vitamin B 12 deficiency    h/o  . Vitamin D deficiency     PAST SURGICAL HISTORY: Past Surgical History:  Procedure Laterality Date  . 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 TONSILLECTOMY AND ADENOIDECTOMY      SOCIAL HISTORY: Social History  Substance Use Topics  . Smoking status: Never Smoker  . Smokeless tobacco: Never Used  . Alcohol use 0.0 oz/week     Comment: occ    FAMILY HISTORY: Family History  Problem Relation Age of Onset  . Obesity Mother   . Depression Father   . Anxiety disorder Father   . Bipolar disorder Father   . Drug abuse Father   . Diabetes Paternal Grandmother   .  Hypertension Paternal Grandmother   . Diabetes Paternal Grandfather   . Prostate cancer Other        GF  . CAD Other        PGF  . Colon cancer Neg Hx     ROS: Review of Systems  Constitutional: Positive for weight loss.  Respiratory: Negative for shortness of breath (on exertion).   Cardiovascular: Positive for palpitations. Negative for chest pain.  Gastrointestinal: Negative for nausea and vomiting.  Endo/Heme/Allergies:       Polyphagia Negative hypoglycemia    PHYSICAL EXAM: Blood pressure 123/79, pulse 83, temperature 98.1 F (36.7 C), temperature source Oral, height 5\' 9"  (1.753 m), weight (!) 355 lb (161 kg), SpO2 96 %. Body mass index is 52.42 kg/m. Physical Exam  Constitutional: He is oriented to person, place, and time. He appears well-developed and well-nourished.  Cardiovascular: Normal rate.   Pulmonary/Chest: Effort normal.  Musculoskeletal: Normal range of motion.  Neurological: He is oriented to person, place, and time.  Skin: Skin is warm and dry.  Psychiatric: He has a normal mood and affect. His behavior is normal.  Vitals reviewed.   RECENT LABS AND TESTS: BMET    Component Value Date/Time   NA 141 07/11/2016 1018   K 4.6 07/11/2016 1018   CL 104 07/11/2016 1018   CO2 24 07/11/2016 1018   GLUCOSE 106 (H) 07/11/2016 1018   GLUCOSE 153 (H) 05/06/2016 1126   BUN 13 07/11/2016 1018   CREATININE 0.67 (L) 07/11/2016 1018   CREATININE 0.73 01/19/2016 1624   CALCIUM 9.2 07/11/2016 1018   GFRNONAA 124 07/11/2016 1018   GFRAA 144 07/11/2016 1018   Lab Results  Component Value Date   HGBA1C 6.2 (H) 07/11/2016   HGBA1C 5.7 01/10/2016   HGBA1C 5.8 07/06/2015   HGBA1C 6.2 01/25/2015   HGBA1C 5.9 07/08/2014   Lab Results  Component Value Date   INSULIN 36.7 (H) 07/11/2016   CBC    Component Value Date/Time   WBC 7.7 07/11/2016 1018   WBC 8.6 05/06/2016 1126   RBC 4.59 07/11/2016 1018   RBC 4.73 05/06/2016 1126   HGB 12.4 (L) 07/11/2016  1018   HCT 37.0 (L) 07/11/2016 1018   PLT 197.0 05/06/2016 1126   MCV 81 07/11/2016 1018   MCH 27.0 07/11/2016 1018   MCH 27.4 02/05/2016 1730   MCHC 33.5 07/11/2016 1018   MCHC 33.1 05/06/2016 1126   RDW 14.4 07/11/2016 1018   LYMPHSABS 2.1 07/11/2016 1018   MONOABS 0.5 05/06/2016 1126   EOSABS 0.1 07/11/2016 1018   BASOSABS 0.0 07/11/2016 1018   Iron/TIBC/Ferritin/ %Sat    Component Value Date/Time   IRON 48 07/26/2015 1030   FERRITIN 79.2 07/26/2015 1030   IRONPCTSAT 13.8 (L) 07/08/2014 0848   Lipid Panel     Component Value Date/Time   CHOL 119 07/11/2016 1018   TRIG 97 07/11/2016 1018   HDL 37 (L) 07/11/2016 1018   CHOLHDL 3 07/26/2015 1030   VLDL 12.0 07/26/2015 1030   LDLCALC 63 07/11/2016 1018   Hepatic Function Panel     Component Value Date/Time   PROT 6.4 07/11/2016 1018   ALBUMIN 4.3 07/11/2016 1018   AST 16 07/11/2016 1018   ALT 22 07/11/2016 1018   ALKPHOS 72 07/11/2016 1018   BILITOT 0.4 07/11/2016 1018      Component Value Date/Time   TSH 2.950 07/11/2016 1018   TSH 2.275 02/05/2016 1730   TSH 1.27 01/10/2016 0835   TSH 2.71 07/08/2014 0848    ASSESSMENT AND PLAN: Essential hypertension  Type 2 diabetes mellitus without complication, without long-term current use of insulin (HCC)  At risk for heart disease  Class 3 obesity with serious comorbidity and body mass index (BMI) of 50.0 to 59.9 in adult, unspecified obesity type ([place])  PLAN:  Hypertension We discussed sodium restriction, working on healthy weight loss, and a regular exercise program as the means to achieve  improved blood pressure control. Yuvaan agreed with this plan and agreed to follow up in 2 weeks. We will continue to monitor his blood pressure as well as his progress with the above lifestyle modifications. He will continue verapamil and re-start metoprolol XL 25 mg qd (no refill needed today) and will watch for signs of hypotension as he continues his lifestyle  modifications.  Diabetes II Braian has been given extensive diabetes education by myself today including ideal fasting and post-prandial blood glucose readings, individual ideal Hgb A1c goals  and hypoglycemia prevention. We discussed the importance of good blood sugar control to decrease the likelihood of diabetic complications such as nephropathy, neuropathy, limb loss, blindness, coronary artery disease, and death. We discussed the importance of intensive lifestyle modification including diet, exercise and weight loss as the first line treatment for diabetes. Bonner agrees to continue his diabetes medications, diet and exercise and will follow up at the agreed upon time.  Cardiovascular risk counselling Quency was given extended (15 minutes) coronary artery disease prevention counseling today. He is 36 y.o. male and has risk factors for heart disease including obesity, hypertension and diabetes. We discussed intensive lifestyle modifications today with an emphasis on specific weight loss instructions and strategies. Pt was also informed of the importance of increasing exercise and decreasing saturated fats to help prevent heart disease.  Obesity Calistro is currently in the action stage of change. As such, his goal is to continue with weight loss efforts He has agreed to follow the Category 3 plan +200 calories Ossie has been instructed to work up to a goal of 150 minutes of combined cardio and strengthening exercise per week for weight loss and overall health benefits. We discussed the following Behavioral Modification Strategies today: holiday eating strategies, travel eating strategies and meal planning & cooking strategies We discussed various medication options to help Phillipe with his weight loss efforts and we both agreed to slowly increase Saxenda to 0.9 mg now that he will be re-starting his Metoprolol.  Donne has agreed to follow up with our clinic in 2 weeks. He was informed of the  importance of frequent follow up visits to maximize his success with intensive lifestyle modifications for his multiple health conditions.  I, Doreene Nest, am acting as transcriptionist for Dennard Nip, MD  I have reviewed the above documentation for accuracy and completeness, and I agree with the above. -Dennard Nip, MD   OBESITY BEHAVIORAL INTERVENTION VISIT  Today's visit was # 4 out of 22.  Starting weight: 396 lbs Starting date: 07/11/16 Today's weight : 355 lbs Today's date: 09/11/2016 Total lbs lost to date: 19 (Patients must lose 7 lbs in the first 6 months to continue with counseling)   ASK: We discussed the diagnosis of obesity with Julian Washington today and Jenaro agreed to give [place] permission to discuss obesity behavioral modification therapy today.  ASSESS: Havier has the diagnosis of obesity and his BMI today is 52.5 Duane is in the action stage of change   ADVISE: Messi was educated on the multiple health risks of obesity as well as the benefit of weight loss to improve his health. He was advised of the need for long term treatment and the importance of lifestyle modifications.  AGREE: Multiple dietary modification options and treatment options were discussed and  Jonavin agreed to follow the Category 3 plan +200 calories We discussed the following Behavioral Modification Strategies today: holiday eating strategies, travel eating strategies and meal planning & cooking strategies

## 2016-09-12 MED FILL — VICTOZA 2-PAK 18 MG/3 ML PE: 18 | 30 days supply | Qty: 3 | Fill #0

## 2016-09-25 ENCOUNTER — Ambulatory Visit (INDEPENDENT_AMBULATORY_CARE_PROVIDER_SITE_OTHER): Payer: 59 | Admitting: Family Medicine

## 2016-09-25 VITALS — BP 124/75 | HR 80 | Temp 98.2°F | Ht 69.0 in | Wt 357.0 lb

## 2016-09-25 DIAGNOSIS — E559 Vitamin D deficiency, unspecified: Secondary | ICD-10-CM

## 2016-09-25 DIAGNOSIS — IMO0001 Reserved for inherently not codable concepts without codable children: Secondary | ICD-10-CM

## 2016-09-25 DIAGNOSIS — E669 Obesity, unspecified: Secondary | ICD-10-CM | POA: Diagnosis not present

## 2016-09-25 DIAGNOSIS — Z9189 Other specified personal risk factors, not elsewhere classified: Secondary | ICD-10-CM

## 2016-09-25 DIAGNOSIS — Z6841 Body Mass Index (BMI) 40.0 and over, adult: Secondary | ICD-10-CM

## 2016-09-25 DIAGNOSIS — E119 Type 2 diabetes mellitus without complications: Secondary | ICD-10-CM | POA: Insufficient documentation

## 2016-09-25 MED ORDER — GLUCOSE BLOOD VI STRP: ORAL_STRIP | 0 refills | Status: DC

## 2016-09-25 MED ORDER — VITAMIN D (ERGOCALCIFEROL) 1.25 MG (50000 UNIT) PO CAPS: 50000.0000 [IU] | ORAL_CAPSULE | ORAL | 0 refills | Status: DC

## 2016-09-25 MED ORDER — ACCU-CHEK SAFE-T PRO LANCETS MISC: 0 refills | Status: DC

## 2016-09-25 NOTE — Progress Notes (Signed)
Office: [phone]  /  Fax: [phone]   HPI:   Chief Complaint: OBESITY Julian Washington is here to discuss his progress with his obesity treatment plan. He is on the  follow the Category 3 plan +200 calories and is following his eating plan approximately 20 % of the time. He states he is exercising 0 minutes 0 times per week. Julian Washington had vacation over the last 2 weeks and his wife was sick. He has increased eating out and snacking but is now ready to get back on track. His weight is (!) 357 lb (161.9 kg) today and has had a weight gain of 2 pounds over a period of 2 weeks since his last visit. He has lost 39 lbs since starting treatment with [place].  Vitamin D deficiency Julian Washington has a diagnosis of vitamin D deficiency. He is currently stable vit D but not yet at goal and he denies nausea, vomiting or muscle weakness.  Diabetes II Julian Washington has a diagnosis of diabetes type II. Kash states fasting BGs range between approximately 90 and 105 on Saxenda, diet and exercise. Hedenies any hypoglycemic episodes. He still notes some pm polyphagia. He has been working on intensive lifestyle modifications including diet, exercise, and weight loss to help control his blood glucose levels.  At risk for cardiovascular disease Julian Washington is at a higher than average risk for cardiovascular disease due to obesity and diabetes. He currently denies any chest pain.  ALLERGIES: Allergies  Allergen Reactions   Levaquin [Levofloxacin In D5w] Swelling   Celexa [Citalopram] Other (See Comments)    Mental status changes    Dextrans Other (See Comments)    Makes the pt. Drowsy.    Hydrocodone-Acetaminophen     REACTION: itching   Latex    Oxycodone Itching    MEDICATIONS: Current Outpatient Prescriptions on File Prior to Visit  Medication Sig Dispense Refill   B Complex Vitamins (B COMPLEX-B12 PO) Take 1 tablet by mouth daily.      Calcium Carbonate-Vitamin D (CALTRATE 600+D) 600-400 MG-UNIT per chew tablet  Chew 1 tablet by mouth daily. None Chew     fexofenadine (ALLEGRA) 180 MG tablet Take 180 mg by mouth daily.     fluticasone (FLONASE) 50 MCG/ACT nasal spray Place 2 sprays into both nostrils daily.     ketoconazole (NIZORAL) 2 % cream Apply 1 application topically 2 (two) times daily. 30 g 0   liraglutide (VICTOZA) 18 MG/3ML SOPN Inject 0.1 mLs (0.6 mg total) into the skin every morning. (Patient taking differently: Inject 1.2 mg into the skin every morning. ) 1 pen 0   Magnesium Oxide (MAG-OXIDE PO) Take 500 mg by mouth daily.     metFORMIN (GLUCOPHAGE-XR) 500 MG 24 hr tablet TAKE 2 TABLETS BY MOUTH DAILY WITH BREAKFAST. 180 tablet 0   metoprolol succinate (TOPROL-XL) 25 MG 24 hr tablet Take 25 mg by mouth daily. As needed     Multiple Vitamin (MULTIVITAMIN) tablet Take 1 tablet by mouth daily.      pantoprazole (PROTONIX) 40 MG tablet Take 1 tablet (40 mg total) by mouth daily. 90 tablet 3   verapamil (CALAN-SR) 240 MG CR tablet TAKE 1 TABLET BY MOUTH AT BEDTIME 90 tablet 1   vitamin B-12 (CYANOCOBALAMIN) 250 MCG tablet Take 500 mcg by mouth daily.      metoprolol tartrate (LOPRESSOR) 25 MG tablet Take 0.5 tablets (12.5 mg total) by mouth daily as needed (PALPITATIONS). 30 tablet 6   No current facility-administered medications on file prior to  visit.     PAST MEDICAL HISTORY: Past Medical History:  Diagnosis Date   Adenoma    Piturity   Anemia    Anxiety    Anxiety and depression    h/o suicidality w/ citalopram   Back pain    Chest pain    Depression    Difficult intubation    ETD (eustachian tube dysfunction)    s/p ENT, declined ear tuves before    GERD (gastroesophageal reflux disease)    History of chicken pox    Titered on 08/16/2005   Hypogonadism male    Dr Cruzita Lederer, used to see urology   Hypogonadism male    Infertility male    Influenza 02/2016   Internal hemorrhoids    s/p banding   Low testosterone    Migraine    on topamax    OSA on CPAP    Palpitations    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[place])    h/o increased prolactin, Dr Cruzita Lederer (previously @ [facility])   Prediabetes    A1C 6.2 years ago   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   Shortness of breath    Sleep apnea    Swelling    Vitamin B 12 deficiency    h/o   Vitamin D deficiency     PAST SURGICAL HISTORY: Past Surgical History:  Procedure Laterality Date   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-04-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, ACTH   TONSILLECTOMY AND ADENOIDECTOMY      SOCIAL HISTORY: Social History  Substance Use Topics   Smoking status: Never Smoker   Smokeless tobacco: Never Used   Alcohol use 0.0 oz/week     Comment: occ    FAMILY HISTORY: Family History  Problem Relation Age of Onset   Obesity Mother    Depression Father    Anxiety disorder Father    Bipolar disorder Father    Drug abuse Father    Diabetes Paternal Grandmother    Hypertension Paternal Grandmother    Diabetes Paternal Grandfather    Prostate cancer Other        GF   CAD Other        PGF   Colon cancer Neg Hx     ROS: Review of Systems  Constitutional: Negative for weight loss.  Cardiovascular: Negative for chest pain.  Gastrointestinal: Negative for nausea and vomiting.  Musculoskeletal:       Negative muscle weakness  Endo/Heme/Allergies:       Polyphagia Negative hypoglycemia    PHYSICAL EXAM: Blood pressure 124/75, pulse 80, temperature 98.2 F (36.8 C), temperature source Oral, height 5\' 9"  (1.753 m), weight (!) 357 lb (161.9 kg), SpO2 97 %. Body mass index is 52.72 kg/m. Physical Exam  Constitutional: He is oriented to person, place, and time. He appears well-developed and well-nourished.  Cardiovascular: Normal rate.   Pulmonary/Chest: Effort normal.  Musculoskeletal: Normal range of motion.  Neurological: He is oriented to person, place, and time.  Skin: Skin is warm and dry.  Psychiatric: He has a normal mood and affect. His  behavior is normal.  Vitals reviewed.   RECENT LABS AND TESTS: BMET    Component Value Date/Time   NA 141 07/11/2016 1018   K 4.6 07/11/2016 1018   CL 104 07/11/2016 1018   CO2 24 07/11/2016 1018   GLUCOSE 106 (H) 07/11/2016 1018   GLUCOSE 153 (H) 05/06/2016 1126   BUN 13 07/11/2016 1018   CREATININE 0.67 (L) 07/11/2016 1018   CREATININE  0.73 01/19/2016 1624   CALCIUM 9.2 07/11/2016 1018   GFRNONAA 124 07/11/2016 1018   GFRAA 144 07/11/2016 1018   Lab Results  Component Value Date   HGBA1C 6.2 (H) 07/11/2016   HGBA1C 5.7 01/10/2016   HGBA1C 5.8 07/06/2015   HGBA1C 6.2 01/25/2015   HGBA1C 5.9 07/08/2014   Lab Results  Component Value Date   INSULIN 36.7 (H) 07/11/2016   CBC    Component Value Date/Time   WBC 7.7 07/11/2016 1018   WBC 8.6 05/06/2016 1126   RBC 4.59 07/11/2016 1018   RBC 4.73 05/06/2016 1126   HGB 12.4 (L) 07/11/2016 1018   HCT 37.0 (L) 07/11/2016 1018   PLT 197.0 05/06/2016 1126   MCV 81 07/11/2016 1018   MCH 27.0 07/11/2016 1018   MCH 27.4 02/05/2016 1730   MCHC 33.5 07/11/2016 1018   MCHC 33.1 05/06/2016 1126   RDW 14.4 07/11/2016 1018   LYMPHSABS 2.1 07/11/2016 1018   MONOABS 0.5 05/06/2016 1126   EOSABS 0.1 07/11/2016 1018   BASOSABS 0.0 07/11/2016 1018   Iron/TIBC/Ferritin/ %Sat    Component Value Date/Time   IRON 48 07/26/2015 1030   FERRITIN 79.2 07/26/2015 1030   IRONPCTSAT 13.8 (L) 07/08/2014 0848   Lipid Panel     Component Value Date/Time   CHOL 119 07/11/2016 1018   TRIG 97 07/11/2016 1018   HDL 37 (L) 07/11/2016 1018   CHOLHDL 3 07/26/2015 1030   VLDL 12.0 07/26/2015 1030   LDLCALC 63 07/11/2016 1018   Hepatic Function Panel     Component Value Date/Time   PROT 6.4 07/11/2016 1018   ALBUMIN 4.3 07/11/2016 1018   AST 16 07/11/2016 1018   ALT 22 07/11/2016 1018   ALKPHOS 72 07/11/2016 1018   BILITOT 0.4 07/11/2016 1018      Component Value Date/Time   TSH 2.950 07/11/2016 1018   TSH 2.275 02/05/2016 1730    TSH 1.27 01/10/2016 0835   TSH 2.71 07/08/2014 0848    ASSESSMENT AND PLAN: Type 2 diabetes mellitus without complication, without long-term current use of insulin (HCC) - Plan: DISCONTINUED: glucose blood (ACCU-CHEK ACTIVE STRIPS) test strip, DISCONTINUED: Lancets (ACCU-CHEK SAFE-T PRO) lancets  Vitamin D deficiency - Plan: Vitamin D, Ergocalciferol, (DRISDOL) 50000 units CAPS capsule  At risk for heart disease  Class 3 obesity with serious comorbidity and body mass index (BMI) of 50.0 to 59.9 in adult, unspecified obesity type (HCC)  PLAN:  Vitamin D Deficiency Julian Washington was informed that low vitamin D levels contributes to fatigue and are associated with obesity, breast, and colon cancer. He agrees to continue to take prescription Vit D @50 ,000 IU every week, we will refill for 1 month and will follow up for routine testing of vitamin D, at least 2-3 times per year. He was informed of the risk of over-replacement of vitamin D and agrees to not increase his dose unless he discusses this with [place] first. Julian Washington agrees to follow up with our clinic in 2 weeks.  Diabetes II Julian Washington has been given extensive diabetes education by myself today including ideal fasting and post-prandial blood glucose readings, individual ideal Hgb A1c goals  and hypoglycemia prevention. We discussed the importance of good blood sugar control to decrease the likelihood of diabetic complications such as nephropathy, neuropathy, limb loss, blindness, coronary artery disease, and death. We discussed the importance of intensive lifestyle modification including diet, exercise and weight loss as the first line treatment for diabetes. Julian Washington agrees to increase victoza to 1.2  mg qd (no refills needed), we will refill accu chek guide strips and accu chek fastclix lancets and he will follow up at the agreed upon time.  Cardiovascular risk counselling Julian Washington was given extended (15 minutes) coronary artery disease prevention counseling  today. He is 36 y.o. male and has risk factors for heart disease including obesity and diabetes. We discussed intensive lifestyle modifications today with an emphasis on specific weight loss instructions and strategies. Pt was also informed of the importance of increasing exercise and decreasing saturated fats to help prevent heart disease.  Obesity Julian Washington is currently in the action stage of change. As such, his goal is to continue with weight loss efforts He has agreed to keep a food journal with 1500 to 1800 calories and 100+ grams of protein  Julian Washington has been instructed to work up to a goal of 150 minutes of combined cardio and strengthening exercise per week for weight loss and overall health benefits. We discussed the following Behavioral Modification Strategies today: meal planning & cooking strategies, planning for success, increasing lean protein intake and decreasing simple carbohydrates   Julian Washington has agreed to follow up with our clinic in 2 weeks. He was informed of the importance of frequent follow up visits to maximize his success with intensive lifestyle modifications for his multiple health conditions.  Julian Washington, Julian Washington, am acting as transcriptionist for Julian Nip, MD  Julian Washington have reviewed the above documentation for accuracy and completeness, and Julian Washington agree with the above. -Julian Nip, MD   OBESITY BEHAVIORAL INTERVENTION VISIT  Today's visit was # 5 out of 22.  Starting weight: 396 lbs Starting date: 07/11/16 Today's weight : 357 lbs Today's date: 09/25/2016 Total lbs lost to date: 37 (Patients must lose 7 lbs in the first 6 months to continue with counseling)   ASK: We discussed the diagnosis of obesity with Julian Washington today and Julian Washington agreed to give [place] permission to discuss obesity behavioral modification therapy today.  ASSESS: Julian Washington has the diagnosis of obesity and his BMI today is 52.8 Julian Washington is in the action stage of change   ADVISE: Julian Washington was educated on the  multiple health risks of obesity as well as the benefit of weight loss to improve his health. He was advised of the need for long term treatment and the importance of lifestyle modifications.  AGREE: Multiple dietary modification options and treatment options were discussed and  Julian Washington agreed to keep a food journal with 1500 to 1800 calories and 100+ grams of protein  We discussed the following Behavioral Modification Strategies today: meal planning & cooking strategies, planning for success, increasing lean protein intake and decreasing simple carbohydrates

## 2016-09-26 DIAGNOSIS — H5213 Myopia, bilateral: Secondary | ICD-10-CM | POA: Diagnosis not present

## 2016-09-26 LAB — HM DIABETES EYE EXAM

## 2016-09-27 ENCOUNTER — Encounter: Payer: Self-pay | Admitting: Internal Medicine

## 2016-10-03 MED FILL — VERAPAMIL ER 240 MG TABLET: 240 | 90 days supply | Qty: 90 | Fill #1

## 2016-10-03 MED FILL — VIT D2 1.25 MG (50,000 UNIT: 1.25 MG | 28 days supply | Qty: 4 | Fill #0

## 2016-10-03 MED FILL — PANTOPRAZOLE SOD DR 40 MG T: 40 | 90 days supply | Qty: 90 | Fill #3

## 2016-10-03 MED FILL — ACCU-CHEK FASTCLIX LANCETS: 51 days supply | Qty: 102 | Fill #0

## 2016-10-16 ENCOUNTER — Ambulatory Visit (INDEPENDENT_AMBULATORY_CARE_PROVIDER_SITE_OTHER): Payer: 59 | Admitting: Family Medicine

## 2016-10-16 VITALS — BP 122/76 | HR 71 | Temp 98.0°F | Ht 69.0 in | Wt 350.0 lb

## 2016-10-16 DIAGNOSIS — Z6841 Body Mass Index (BMI) 40.0 and over, adult: Secondary | ICD-10-CM

## 2016-10-16 DIAGNOSIS — E669 Obesity, unspecified: Secondary | ICD-10-CM

## 2016-10-16 DIAGNOSIS — Z9189 Other specified personal risk factors, not elsewhere classified: Secondary | ICD-10-CM | POA: Diagnosis not present

## 2016-10-16 DIAGNOSIS — F3289 Other specified depressive episodes: Secondary | ICD-10-CM | POA: Diagnosis not present

## 2016-10-16 DIAGNOSIS — E119 Type 2 diabetes mellitus without complications: Secondary | ICD-10-CM | POA: Diagnosis not present

## 2016-10-16 DIAGNOSIS — E559 Vitamin D deficiency, unspecified: Secondary | ICD-10-CM | POA: Diagnosis not present

## 2016-10-16 DIAGNOSIS — IMO0001 Reserved for inherently not codable concepts without codable children: Secondary | ICD-10-CM

## 2016-10-16 MED ORDER — VITAMIN D (ERGOCALCIFEROL) 1.25 MG (50000 UNIT) PO CAPS: 50000.0000 [IU] | ORAL_CAPSULE | ORAL | 0 refills | Status: DC

## 2016-10-16 MED ORDER — LIRAGLUTIDE 18 MG/3ML ~~LOC~~ SOPN: 1.8000 mg | PEN_INJECTOR | Freq: Every morning | SUBCUTANEOUS | 0 refills | Status: DC

## 2016-10-16 MED ORDER — BUPROPION HCL ER (SR) 150 MG PO TB12: 150.0000 mg | ORAL_TABLET | Freq: Every day | ORAL | 0 refills | Status: DC

## 2016-10-16 MED FILL — VICTOZA 18 MG/3 ML INJECT P: 18 | 30 days supply | Qty: 9 | Fill #0

## 2016-10-16 MED FILL — BUPROPION SR 150 MG TABLET: 150 | 30 days supply | Qty: 30 | Fill #0

## 2016-10-16 NOTE — Progress Notes (Signed)
Office: [phone]  /  Fax: [phone]   HPI:   Chief Complaint: OBESITY Julian Washington is here to discuss his progress with his obesity treatment plan. He is on the keep a food journal with 1800 calories and 150 grams of protein and is following his eating plan approximately 50 % of the time. He states he is exercising 0 minutes 0 times per week. Julian Washington continues to do well with weight loss but has struggled more with emotional eating due to elevated work stress. His temptations have increased but he is working on increasing his protein especially in the morning. His weight is (!) 350 lb (158.8 kg) today and has had a weight loss of 7 pounds over a period of 3 weeks since his last visit. He has lost 46 lbs since starting treatment with [place].  Diabetes II Julian Washington has a diagnosis of diabetes type II. Julian Washington is doing well with diet and weight loss, but notes he still has some polyphagia, on victoza 1.2 mg. He admits mild nausea if he overeats significantly but denies any hypoglycemic episodes. Last A1c was Hemoglobin A1C Latest Ref Rng & Units 07/11/2016 01/10/2016  HGBA1C 4.8 - 5.6 % 6.2(H) 5.7  Some recent data might be hidden    He has been working on intensive lifestyle modifications including diet, exercise, and weight loss to help control his blood glucose levels.  At risk for cardiovascular disease Julian Washington is at a higher than average risk for cardiovascular disease due to obesity and diabetes. He currently denies any chest pain.  Depression with emotional eating behaviors Julian Washington has a history of major depressive disorder with suicide attempt on celexa. He notes elevated stress mostly at work and he is doing more emotional eating and using food for comfort to the extent that it is negatively impacting his health. He often snacks when he is not hungry. Julian Washington sometimes feels he is out of control and then feels guilty that he made poor food choices. He has been working on behavior modification  techniques to help reduce his emotional eating and has been somewhat successful. He shows no sign of suicidal attempts, suicidal or homicidal ideations.  Vitamin D deficiency Julian Washington has a diagnosis of vitamin D deficiency. He is stable on prescription Vit D but is not yet at goal. He denies nausea, vomiting or muscle weakness.  Depression screen [facility] 2/9 07/11/2016 01/17/2016 08/31/2015 07/26/2015 04/06/2015  Decreased Interest 2 0 0 0 0  Down, Depressed, Hopeless 2 0 0 0 0  PHQ - 2 Score 4 0 0 0 0  Altered sleeping 1 - - - -  Tired, decreased energy 3 - - - -  Change in appetite 3 - - - -  Feeling bad or failure about yourself  2 - - - -  Trouble concentrating 2 - - - -  Moving slowly or fidgety/restless 3 - - - -  Suicidal thoughts 1 - - - -  PHQ-9 Score 19 - - - -    ALLERGIES: Allergies  Allergen Reactions  . Levaquin [Levofloxacin In D5w] Swelling  . Celexa [Citalopram] Other (See Comments)    Mental status changes   . Dextrans Other (See Comments)    Makes the pt. Drowsy.   . Hydrocodone-Acetaminophen     REACTION: itching  . Latex   . Oxycodone Itching    MEDICATIONS: Current Outpatient Prescriptions on File Prior to Visit  Medication Sig Dispense Refill  . ACCU-CHEK FASTCLIX LANCETS MISC by Does not apply route.    Marland Kitchen  B Complex Vitamins (B COMPLEX-B12 PO) Take 1 tablet by mouth daily.     . fexofenadine (ALLEGRA) 180 MG tablet Take 180 mg by mouth daily.    . fluticasone (FLONASE) 50 MCG/ACT nasal spray Place 2 sprays into both nostrils daily.    Marland Kitchen glucose blood (ACCU-CHEK GUIDE) test strip 1 each by Other route 2 (two) times daily. Use as instructed    . Magnesium Oxide (MAG-OXIDE PO) Take 500 mg by mouth daily.    . metFORMIN (GLUCOPHAGE-XR) 500 MG 24 hr tablet TAKE 2 TABLETS BY MOUTH DAILY WITH BREAKFAST. 180 tablet 0  . metoprolol succinate (TOPROL-XL) 25 MG 24 hr tablet Take 25 mg by mouth daily. As needed    . metoprolol tartrate (LOPRESSOR) 25 MG tablet Take 0.5  tablets (12.5 mg total) by mouth daily as needed (PALPITATIONS). 30 tablet 6  . Multiple Vitamin (MULTIVITAMIN) tablet Take 1 tablet by mouth daily.     . pantoprazole (PROTONIX) 40 MG tablet Take 1 tablet (40 mg total) by mouth daily. 90 tablet 3  . verapamil (CALAN-SR) 240 MG CR tablet TAKE 1 TABLET BY MOUTH AT BEDTIME 90 tablet 1  . vitamin B-12 (CYANOCOBALAMIN) 250 MCG tablet Take 500 mcg by mouth daily.     . Vitamin D, Ergocalciferol, (DRISDOL) 50000 units CAPS capsule Take 1 capsule (50,000 Units total) by mouth every 7 (seven) days. 4 capsule 0   No current facility-administered medications on file prior to visit.     PAST MEDICAL HISTORY: Past Medical History:  Diagnosis Date  . Adenoma    Piturity  . Anemia   . Anxiety   . Anxiety and depression    h/o suicidality w/ citalopram  . Back pain   . Chest pain   . Depression   . Difficult intubation   . ETD (eustachian tube dysfunction)    s/p ENT, declined ear tuves before   . GERD (gastroesophageal reflux disease)   . History of chicken pox    Titered on 08/16/2005  . Hypogonadism male    Dr Cruzita Lederer, used to see urology  . Hypogonadism male   . Infertility male   . Influenza 02/2016  . Internal hemorrhoids    s/p banding  . Low testosterone   . Migraine    on topamax  . OSA on CPAP   . Palpitations   . 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 Prediabetes    A1C 6.2 years ago  . 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 Shortness of breath   . Sleep apnea   . Swelling   . Vitamin B 12 deficiency    h/o  . Vitamin D deficiency     PAST SURGICAL HISTORY: Past Surgical History:  Procedure Laterality Date  . 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 TONSILLECTOMY AND ADENOIDECTOMY      SOCIAL HISTORY: Social History  Substance Use Topics  . Smoking status: Never Smoker  . Smokeless tobacco: Never Used  . Alcohol use 0.0 oz/week       Comment: occ    FAMILY HISTORY: Family History  Problem Relation Age of Onset  . Obesity Mother   . Depression Father   . Anxiety disorder Father   . Bipolar disorder Father   . Drug abuse Father   . Diabetes Paternal Grandmother   . Hypertension Paternal Grandmother   . Diabetes Paternal Grandfather   .  Prostate cancer Other        GF  . CAD Other        PGF  . Colon cancer Neg Hx     ROS: Review of Systems  Constitutional: Positive for weight loss.  Cardiovascular: Negative for chest pain.  Gastrointestinal: Positive for nausea. Negative for vomiting.  Musculoskeletal:       Negative muscle weakness  Endo/Heme/Allergies:       Polyphagia Negative hypoglycemia  Psychiatric/Behavioral: Positive for depression. Negative for suicidal ideas.    PHYSICAL EXAM: Blood pressure 122/76, pulse 71, temperature 98 F (36.7 C), temperature source Oral, height 5\' 9"  (1.753 m), weight (!) 350 lb (158.8 kg), SpO2 97 %. Body mass index is 51.69 kg/m. Physical Exam  Constitutional: He is oriented to person, place, and time. He appears well-developed and well-nourished.  Cardiovascular: Normal rate.   Pulmonary/Chest: Effort normal.  Musculoskeletal: Normal range of motion.  Neurological: He is oriented to person, place, and time.  Skin: Skin is warm and dry.  Vitals reviewed.   RECENT LABS AND TESTS: BMET    Component Value Date/Time   NA 141 07/11/2016 1018   K 4.6 07/11/2016 1018   CL 104 07/11/2016 1018   CO2 24 07/11/2016 1018   GLUCOSE 106 (H) 07/11/2016 1018   GLUCOSE 153 (H) 05/06/2016 1126   BUN 13 07/11/2016 1018   CREATININE 0.67 (L) 07/11/2016 1018   CREATININE 0.73 01/19/2016 1624   CALCIUM 9.2 07/11/2016 1018   GFRNONAA 124 07/11/2016 1018   GFRAA 144 07/11/2016 1018   Lab Results  Component Value Date   HGBA1C 6.2 (H) 07/11/2016   HGBA1C 5.7 01/10/2016   HGBA1C 5.8 07/06/2015   HGBA1C 6.2 01/25/2015   HGBA1C 5.9 07/08/2014   Lab Results   Component Value Date   INSULIN 36.7 (H) 07/11/2016   CBC    Component Value Date/Time   WBC 7.7 07/11/2016 1018   WBC 8.6 05/06/2016 1126   RBC 4.59 07/11/2016 1018   RBC 4.73 05/06/2016 1126   HGB 12.4 (L) 07/11/2016 1018   HCT 37.0 (L) 07/11/2016 1018   PLT 197.0 05/06/2016 1126   MCV 81 07/11/2016 1018   MCH 27.0 07/11/2016 1018   MCH 27.4 02/05/2016 1730   MCHC 33.5 07/11/2016 1018   MCHC 33.1 05/06/2016 1126   RDW 14.4 07/11/2016 1018   LYMPHSABS 2.1 07/11/2016 1018   MONOABS 0.5 05/06/2016 1126   EOSABS 0.1 07/11/2016 1018   BASOSABS 0.0 07/11/2016 1018   Iron/TIBC/Ferritin/ %Sat    Component Value Date/Time   IRON 48 07/26/2015 1030   FERRITIN 79.2 07/26/2015 1030   IRONPCTSAT 13.8 (L) 07/08/2014 0848   Lipid Panel     Component Value Date/Time   CHOL 119 07/11/2016 1018   TRIG 97 07/11/2016 1018   HDL 37 (L) 07/11/2016 1018   CHOLHDL 3 07/26/2015 1030   VLDL 12.0 07/26/2015 1030   LDLCALC 63 07/11/2016 1018   Hepatic Function Panel     Component Value Date/Time   PROT 6.4 07/11/2016 1018   ALBUMIN 4.3 07/11/2016 1018   AST 16 07/11/2016 1018   ALT 22 07/11/2016 1018   ALKPHOS 72 07/11/2016 1018   BILITOT 0.4 07/11/2016 1018      Component Value Date/Time   TSH 2.950 07/11/2016 1018   TSH 2.275 02/05/2016 1730   TSH 1.27 01/10/2016 0835   TSH 2.71 07/08/2014 0848    ASSESSMENT AND PLAN: Type 2 diabetes mellitus without complication, without long-term current  use of insulin (Julian Washington) - Plan: liraglutide 18 MG/3ML SOPN  Other depression - Plan: buPROPion (WELLBUTRIN SR) 150 MG 12 hr tablet  Vitamin D deficiency - Plan: Vitamin D, Ergocalciferol, (DRISDOL) 50000 units CAPS capsule  At risk for heart disease  Class 3 obesity with serious comorbidity and body mass index (BMI) of 50.0 to 59.9 in adult, unspecified obesity type (Julian Washington)  PLAN:  Diabetes II Julian Washington has been given extensive diabetes education by myself today including ideal fasting and  post-prandial blood glucose readings, individual ideal Hgb A1c goals and hypoglycemia prevention. We discussed the importance of good blood sugar control to decrease the likelihood of diabetic complications such as nephropathy, neuropathy, limb loss, blindness, coronary artery disease, and death. We discussed the importance of intensive lifestyle modification including diet, exercise and weight loss as the first line treatment for diabetes. Julian Washington agrees to increase victoza to 1.8 mg q day #3 pack with no refills and will follow up with our clinic in 2 to 3 weeks.  Cardiovascular risk counselling Julian Washington was given extended (15 minutes) coronary artery disease prevention counseling today. He is 36 y.o. male and has risk factors for heart disease including obesity. We discussed intensive lifestyle modifications today with an emphasis on specific weight loss instructions and strategies. Pt was also informed of the importance of increasing exercise and decreasing saturated fats to help prevent heart disease.  Depression with Emotional Eating Behaviors We discussed behavior modification techniques today to help Julian Washington deal with his emotional eating and depression. He has agreed to start Wellbutrin SR 150 mg q AM #30 with no refills and will monitor very closely. Julian Washington agrees to involve his wife to look for mood changes and let us know if his mood worsens and he will follow up with our clinic in 2 to 3 weeks.  Vitamin D Deficiency Julian Washington was informed that low vitamin D levels contributes to fatigue and are associated with obesity, breast, and colon cancer. He agrees to continue to take prescription Vit D @50 ,000 IU every week and we will refill for 1 month. Olyn will follow up for routine testing of vitamin D, at least 2-3 times per year. He was informed of the risk of over-replacement of vitamin D and agrees to not increase his dose unless he discusses this with [place] first. He agrees to follow up with our clinic  in 2 to 3 weeks.  Obesity Alyaan is currently in the action stage of change. As such, his goal is to continue with weight loss efforts He has agreed to keep a food journal with 1800 calories and 150 grams of protein  Andyn has been instructed to work up to a goal of 150 minutes of combined cardio and strengthening exercise per week for weight loss and overall health benefits. We discussed the following Behavioral Modification Strategies today: ways to avoid boredom eating, emotional eating strategies, celebration eating strategies, and planning for success.   Evren has agreed to follow up with our clinic in 2 to 3 weeks. He was informed of the importance of frequent follow up visits to maximize his success with intensive lifestyle modifications for his multiple health conditions.  I, Trixie Dredge, am acting as transcriptionist for Dennard Nip, MD  I have reviewed the above documentation for accuracy and completeness, and I agree with the above. -Dennard Nip, MD     OBESITY BEHAVIORAL INTERVENTION VISIT  Today's visit was # 6 out of 22.  Starting weight: 396 lbs Starting date: 07/11/16 Today's weight:  350 lbs  Today's date: 10/16/16 Total lbs lost to date: 40 (Patients must lose 7 lbs in the first 6 months to continue with counseling)   ASK: We discussed the diagnosis of obesity with Julian Washington today and Julian Washington agreed to give [place] permission to discuss obesity behavioral modification therapy today.  ASSESS: Julian Washington has the diagnosis of obesity and his BMI today is 23 Julian Washington is in the action stage of change   ADVISE: Jago was educated on the multiple health risks of obesity as well as the benefit of weight loss to improve his health. He was advised of the need for long term treatment and the importance of lifestyle modifications.  AGREE: Multiple dietary modification options and treatment options were discussed and  Lonnell agreed to keep a food journal with 1800 calories  and 150 protein  We discussed the following Behavioral Modification Strategies today: ways to avoid boredom eating, emotional eating strategies, celebration eating strategies, and planning for success.

## 2016-10-30 ENCOUNTER — Ambulatory Visit (INDEPENDENT_AMBULATORY_CARE_PROVIDER_SITE_OTHER): Payer: 59 | Admitting: Family Medicine

## 2016-10-30 VITALS — BP 123/79 | HR 72 | Temp 98.3°F | Ht 69.0 in | Wt 349.0 lb

## 2016-10-30 DIAGNOSIS — E119 Type 2 diabetes mellitus without complications: Secondary | ICD-10-CM | POA: Diagnosis not present

## 2016-10-30 DIAGNOSIS — IMO0001 Reserved for inherently not codable concepts without codable children: Secondary | ICD-10-CM

## 2016-10-30 DIAGNOSIS — E669 Obesity, unspecified: Secondary | ICD-10-CM

## 2016-10-30 DIAGNOSIS — Z6841 Body Mass Index (BMI) 40.0 and over, adult: Secondary | ICD-10-CM | POA: Diagnosis not present

## 2016-10-30 MED ORDER — LIRAGLUTIDE 18 MG/3ML ~~LOC~~ SOPN: 1.8000 mg | PEN_INJECTOR | Freq: Every morning | SUBCUTANEOUS | 0 refills | Status: DC

## 2016-10-30 MED FILL — VIT D2 1.25 MG (50,000 UNIT: 1.25 MG | 28 days supply | Qty: 4 | Fill #0

## 2016-10-31 NOTE — Progress Notes (Signed)
Office: [phone]  /  Fax: [phone]   HPI:   Chief Complaint: OBESITY Julian Washington is here to discuss his progress with his obesity treatment plan. He is on the keep a food journal with 1800 calories and 150 grams of protein daily and is following his eating plan approximately 60 to 65 % of the time. He states he is exercising 0 minutes 0 times per week. Julian Washington continues to do well with weight loss. He is struggling with dinner more and is eating out extra. His weight is (!) 349 lb (158.3 kg) today and has had a weight loss of 1 pound over a period of 2 weeks since his last visit. He has lost 47 lbs since starting treatment with Julian Washington.  Diabetes II Julian Washington has a diagnosis of diabetes type II. Julian Washington is doing well on Victoza and denies any hypoglycemic episodes, nausea or vomiting. He had a tender, enlarged lymph node in his right anterior chain after his flu shot and mentioned it due to concerns about thyroid. He has been working on intensive lifestyle modifications including diet, exercise, and weight loss to help control his blood glucose levels.   ALLERGIES: Allergies  Allergen Reactions  . Levaquin [Levofloxacin In D5w] Swelling  . Celexa [Citalopram] Other (See Comments)    Mental status changes   . Dextrans Other (See Comments)    Makes the pt. Drowsy.   . Hydrocodone-Acetaminophen     REACTION: itching  . Latex   . Oxycodone Itching    MEDICATIONS: Current Outpatient Prescriptions on File Prior to Visit  Medication Sig Dispense Refill  . ACCU-CHEK FASTCLIX LANCETS MISC by Does not apply route.    . B Complex Vitamins (B COMPLEX-B12 PO) Take 1 tablet by mouth daily.     Marland Kitchen buPROPion (WELLBUTRIN SR) 150 MG 12 hr tablet Take 1 tablet (150 mg total) by mouth daily. 30 tablet 0  . fexofenadine (ALLEGRA) 180 MG tablet Take 180 mg by mouth daily.    . fluticasone (FLONASE) 50 MCG/ACT nasal spray Place 2 sprays into both nostrils daily.    Marland Kitchen glucose blood (ACCU-CHEK GUIDE) test  strip 1 each by Other route 2 (two) times daily. Use as instructed    . Magnesium Oxide (MAG-OXIDE PO) Take 500 mg by mouth daily.    . metFORMIN (GLUCOPHAGE-XR) 500 MG 24 hr tablet TAKE 2 TABLETS BY MOUTH DAILY WITH BREAKFAST. 180 tablet 0  . metoprolol succinate (TOPROL-XL) 25 MG 24 hr tablet Take 25 mg by mouth daily. As needed    . Multiple Vitamin (MULTIVITAMIN) tablet Take 1 tablet by mouth daily.     . pantoprazole (PROTONIX) 40 MG tablet Take 1 tablet (40 mg total) by mouth daily. 90 tablet 3  . verapamil (CALAN-SR) 240 MG CR tablet TAKE 1 TABLET BY MOUTH AT BEDTIME 90 tablet 1  . vitamin B-12 (CYANOCOBALAMIN) 250 MCG tablet Take 500 mcg by mouth daily.     . Vitamin D, Ergocalciferol, (DRISDOL) 50000 units CAPS capsule Take 1 capsule (50,000 Units total) by mouth every 7 (seven) days. 4 capsule 0  . metoprolol tartrate (LOPRESSOR) 25 MG tablet Take 0.5 tablets (12.5 mg total) by mouth daily as needed (PALPITATIONS). 30 tablet 6   No current facility-administered medications on file prior to visit.     PAST MEDICAL HISTORY: Past Medical History:  Diagnosis Date  . Adenoma    Piturity  . Anemia   . Anxiety   . Anxiety and depression    h/o suicidality  w/ citalopram  . Back pain   . Chest pain   . Depression   . Difficult intubation   . ETD (eustachian tube dysfunction)    s/p ENT, declined ear tuves before   . GERD (gastroesophageal reflux disease)   . History of chicken pox    Titered on 08/16/2005  . Hypogonadism male    Dr Cruzita Lederer, used to see urology  . Hypogonadism male   . Infertility male   . Influenza 02/2016  . Internal hemorrhoids    s/p banding  . Low testosterone   . Migraine    on topamax  . OSA on CPAP   . Palpitations   . 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 Prediabetes    A1C 6.2 years ago  . 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 Shortness of breath   . Sleep apnea   .  Swelling   . Vitamin B 12 deficiency    h/o  . Vitamin D deficiency     PAST SURGICAL HISTORY: Past Surgical History:  Procedure Laterality Date  . 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 TONSILLECTOMY AND ADENOIDECTOMY      SOCIAL HISTORY: Social History  Substance Use Topics  . Smoking status: Never Smoker  . Smokeless tobacco: Never Used  . Alcohol use 0.0 oz/week     Comment: occ    FAMILY HISTORY: Family History  Problem Relation Age of Onset  . Obesity Mother   . Depression Father   . Anxiety disorder Father   . Bipolar disorder Father   . Drug abuse Father   . Diabetes Paternal Grandmother   . Hypertension Paternal Grandmother   . Diabetes Paternal Grandfather   . Prostate cancer Other        GF  . CAD Other        PGF  . Colon cancer Neg Hx     ROS: Review of Systems  Constitutional: Positive for weight loss.  Gastrointestinal: Negative for nausea and vomiting.  Endo/Heme/Allergies:       Negative hypoglycemia    PHYSICAL EXAM: Blood pressure 123/79, pulse 72, temperature 98.3 F (36.8 C), temperature source Oral, height 5\' 9"  (1.753 m), weight (!) 349 lb (158.3 kg), SpO2 96 %. Body mass index is 51.54 kg/m. Physical Exam  Constitutional: He is oriented to person, place, and time. He appears well-developed and well-nourished.  Cardiovascular: Normal rate.   Pulmonary/Chest: Effort normal.  Musculoskeletal: Normal range of motion.  Neurological: He is oriented to person, place, and time.  Skin: Skin is warm and dry.  Psychiatric: He has a normal mood and affect. His behavior is normal.  Vitals reviewed.   RECENT LABS AND TESTS: BMET    Component Value Date/Time   NA 141 07/11/2016 1018   K 4.6 07/11/2016 1018   CL 104 07/11/2016 1018   CO2 24 07/11/2016 1018   GLUCOSE 106 (H) 07/11/2016 1018   GLUCOSE 153 (H) 05/06/2016 1126   BUN 13 07/11/2016 1018   CREATININE 0.67 (L) 07/11/2016 1018   CREATININE 0.73 01/19/2016 1624     CALCIUM 9.2 07/11/2016 1018   GFRNONAA 124 07/11/2016 1018   GFRAA 144 07/11/2016 1018   Lab Results  Component Value Date   HGBA1C 6.2 (H) 07/11/2016   HGBA1C 5.7 01/10/2016   HGBA1C 5.8 07/06/2015   HGBA1C 6.2 01/25/2015   HGBA1C 5.9 07/08/2014  Lab Results  Component Value Date   INSULIN 36.7 (H) 07/11/2016   CBC    Component Value Date/Time   WBC 7.7 07/11/2016 1018   WBC 8.6 05/06/2016 1126   RBC 4.59 07/11/2016 1018   RBC 4.73 05/06/2016 1126   HGB 12.4 (L) 07/11/2016 1018   HCT 37.0 (L) 07/11/2016 1018   PLT 197.0 05/06/2016 1126   MCV 81 07/11/2016 1018   MCH 27.0 07/11/2016 1018   MCH 27.4 02/05/2016 1730   MCHC 33.5 07/11/2016 1018   MCHC 33.1 05/06/2016 1126   RDW 14.4 07/11/2016 1018   LYMPHSABS 2.1 07/11/2016 1018   MONOABS 0.5 05/06/2016 1126   EOSABS 0.1 07/11/2016 1018   BASOSABS 0.0 07/11/2016 1018   Iron/TIBC/Ferritin/ %Sat    Component Value Date/Time   IRON 48 07/26/2015 1030   FERRITIN 79.2 07/26/2015 1030   IRONPCTSAT 13.8 (L) 07/08/2014 0848   Lipid Panel     Component Value Date/Time   CHOL 119 07/11/2016 1018   TRIG 97 07/11/2016 1018   HDL 37 (L) 07/11/2016 1018   CHOLHDL 3 07/26/2015 1030   VLDL 12.0 07/26/2015 1030   LDLCALC 63 07/11/2016 1018   Hepatic Function Panel     Component Value Date/Time   PROT 6.4 07/11/2016 1018   ALBUMIN 4.3 07/11/2016 1018   AST 16 07/11/2016 1018   ALT 22 07/11/2016 1018   ALKPHOS 72 07/11/2016 1018   BILITOT 0.4 07/11/2016 1018      Component Value Date/Time   TSH 2.950 07/11/2016 1018   TSH 2.275 02/05/2016 1730   TSH 1.27 01/10/2016 0835   TSH 2.71 07/08/2014 0848    ASSESSMENT AND PLAN: Type 2 diabetes mellitus without complication, without long-term current use of insulin (HCC) - Plan: liraglutide 18 MG/3ML SOPN  Class 3 obesity with serious comorbidity and body mass index (BMI) of 50.0 to 59.9 in adult, unspecified obesity type (Grandfather)  PLAN:  Diabetes II Zohaib has been  given extensive diabetes education by myself today including ideal fasting and post-prandial blood glucose readings, individual ideal Hgb A1c goals  and hypoglycemia prevention. We discussed the importance of good blood sugar control to decrease the likelihood of diabetic complications such as nephropathy, neuropathy, limb loss, blindness, coronary artery disease, and death. We discussed the importance of intensive lifestyle modification including diet, exercise and weight loss as the first line treatment for diabetes. Julian Washington agrees to continue Victoza 1.8 mg #3 pen with no refills and was advised to follow up in 2 to 3 weeks and if he still notes a "lump", we will re-check thyroid tests just to be extra cautious.   Obesity Julian Washington is currently in the action stage of change. As such, his goal is to continue with weight loss efforts He has agreed to keep a food journal with 400 to 650 calories and 40 grams of protein at supper daily and follow the Category 3 plan Julian Washington has been instructed to work up to a goal of 150 minutes of combined cardio and strengthening exercise per week for weight loss and overall health benefits. We discussed the following Behavioral Modification Strategies today: increasing lean protein intake, decreasing simple carbohydrates  and ways to avoid boredom eating  Ustin has agreed to follow up with our clinic in 2 to 3 weeks fasting. He was informed of the importance of frequent follow up visits to maximize his success with intensive lifestyle modifications for his multiple health conditions.  Corey Skains, am acting as transcriptionist for  Julian Nip, MD  I have reviewed the above documentation for accuracy and completeness, and I agree with the above. -Julian Nip, MD    OBESITY BEHAVIORAL INTERVENTION VISIT  Today's visit was # 7 out of 22.  Starting weight: 396 lbs Starting date: 07/11/16 Today's weight : 349 lbs Today's date: 10/30/2016 Total lbs lost to  date: 40 (Patients must lose 7 lbs in the first 6 months to continue with counseling)   ASK: We discussed the diagnosis of obesity with Julian Washington today and Julian Washington agreed to give Julian Washington permission to discuss obesity behavioral modification therapy today.  ASSESS: Evelyn has the diagnosis of obesity and his BMI today is 51.51 Darrius is in the action stage of change   ADVISE: Wong was educated on the multiple health risks of obesity as well as the benefit of weight loss to improve his health. He was advised of the need for long term treatment and the importance of lifestyle modifications.  AGREE: Multiple dietary modification options and treatment options were discussed and  Nikia agreed to keep a food journal with 450 to 650 calories and 40 grams of protein at supper daily and follow the Category 3 plan We discussed the following Behavioral Modification Strategies today: increasing lean protein intake, decreasing simple carbohydrates and ways to avoid boredom eating

## 2016-11-11 ENCOUNTER — Other Ambulatory Visit (INDEPENDENT_AMBULATORY_CARE_PROVIDER_SITE_OTHER): Payer: Self-pay | Admitting: Family Medicine

## 2016-11-11 DIAGNOSIS — F3289 Other specified depressive episodes: Secondary | ICD-10-CM

## 2016-11-11 MED FILL — VICTOZA 18 MG/3 ML INJECT P: 18 | 30 days supply | Qty: 9 | Fill #0

## 2016-11-11 MED FILL — valACYclovir HCL 1 GM TABS: 1 | 7 days supply | Qty: 21 | Fill #0

## 2016-11-11 MED FILL — BUPROPION SR 150 MG TABLET: 150 | 30 days supply | Qty: 30 | Fill #0

## 2016-11-15 ENCOUNTER — Ambulatory Visit: Payer: 59 | Admitting: Internal Medicine

## 2016-11-20 ENCOUNTER — Ambulatory Visit (INDEPENDENT_AMBULATORY_CARE_PROVIDER_SITE_OTHER): Payer: 59 | Admitting: Family Medicine

## 2016-11-20 VITALS — BP 112/72 | HR 78 | Temp 98.2°F | Ht 69.0 in | Wt 345.0 lb

## 2016-11-20 DIAGNOSIS — E119 Type 2 diabetes mellitus without complications: Secondary | ICD-10-CM | POA: Diagnosis not present

## 2016-11-20 DIAGNOSIS — Z6841 Body Mass Index (BMI) 40.0 and over, adult: Secondary | ICD-10-CM | POA: Diagnosis not present

## 2016-11-20 DIAGNOSIS — R7989 Other specified abnormal findings of blood chemistry: Secondary | ICD-10-CM | POA: Diagnosis not present

## 2016-11-20 DIAGNOSIS — D508 Other iron deficiency anemias: Secondary | ICD-10-CM | POA: Diagnosis not present

## 2016-11-20 DIAGNOSIS — Z9189 Other specified personal risk factors, not elsewhere classified: Secondary | ICD-10-CM | POA: Diagnosis not present

## 2016-11-20 DIAGNOSIS — E559 Vitamin D deficiency, unspecified: Secondary | ICD-10-CM

## 2016-11-21 LAB — ANEMIA PANEL
FERRITIN: 156 ng/mL (ref 30–400)
Folate, Hemolysate: 530.8 ng/mL
Folate, RBC: 1438 ng/mL (ref >498)
Hematocrit: 36.9 % — ABNORMAL LOW (ref 37.5–51.0)
IRON SATURATION: 15 % (ref 15–55)
Iron: 37 ug/dL — ABNORMAL LOW (ref 38–169)
RETIC CT PCT: 1.3 % (ref 0.6–2.6)
TIBC: 246 ug/dL — AB (ref 250–450)
UIBC: 209 ug/dL (ref 111–343)
VITAMIN B 12: 1695 pg/mL — AB (ref 232–1245)

## 2016-11-21 LAB — COMPREHENSIVE METABOLIC PANEL
A/G RATIO: 2.2 (ref 1.2–2.2)
ALT: 14 IU/L (ref 0–44)
AST: 10 IU/L (ref 0–40)
Albumin: 4.4 g/dL (ref 3.5–5.5)
Alkaline Phosphatase: 77 IU/L (ref 39–117)
BUN/Creatinine Ratio: 16 (ref 9–20)
BUN: 18 mg/dL (ref 6–20)
Bilirubin Total: 0.2 mg/dL (ref 0.0–1.2)
CALCIUM: 9.1 mg/dL (ref 8.7–10.2)
CO2: 21 mmol/L (ref 20–29)
CREATININE: 1.11 mg/dL (ref 0.76–1.27)
Chloride: 107 mmol/L — ABNORMAL HIGH (ref 96–106)
GFR, EST AFRICAN AMERICAN: 99 mL/min/{1.73_m2} (ref >59)
GFR, EST NON AFRICAN AMERICAN: 86 mL/min/{1.73_m2} (ref >59)
GLOBULIN, TOTAL: 2 g/dL (ref 1.5–4.5)
Glucose: 100 mg/dL — ABNORMAL HIGH (ref 65–99)
POTASSIUM: 4.5 mmol/L (ref 3.5–5.2)
Sodium: 141 mmol/L (ref 134–144)
TOTAL PROTEIN: 6.4 g/dL (ref 6.0–8.5)

## 2016-11-21 LAB — CBC WITH DIFFERENTIAL
Basophils Absolute: 0 10*3/uL (ref 0.0–0.2)
Basos: 0 %
EOS (ABSOLUTE): 0.1 10*3/uL (ref 0.0–0.4)
EOS: 1 %
HEMOGLOBIN: 12.1 g/dL — AB (ref 13.0–17.7)
IMMATURE GRANULOCYTES: 0 %
Immature Grans (Abs): 0 10*3/uL (ref 0.0–0.1)
LYMPHS ABS: 1.9 10*3/uL (ref 0.7–3.1)
Lymphs: 28 %
MCH: 26.4 pg — AB (ref 26.6–33.0)
MCHC: 32.8 g/dL (ref 31.5–35.7)
MCV: 81 fL (ref 79–97)
MONOCYTES: 5 %
MONOS ABS: 0.4 10*3/uL (ref 0.1–0.9)
NEUTROS ABS: 4.6 10*3/uL (ref 1.4–7.0)
NEUTROS PCT: 66 %
RBC: 4.58 x10E6/uL (ref 4.14–5.80)
RDW: 14.8 % (ref 12.3–15.4)
WBC: 7 10*3/uL (ref 3.4–10.8)

## 2016-11-21 LAB — LIPID PANEL WITH LDL/HDL RATIO
CHOLESTEROL TOTAL: 122 mg/dL (ref 100–199)
HDL: 33 mg/dL — ABNORMAL LOW (ref >39)
LDL CALC: 65 mg/dL (ref 0–99)
LDL/HDL RATIO: 2 ratio (ref 0.0–3.6)
Triglycerides: 118 mg/dL (ref 0–149)
VLDL CHOLESTEROL CAL: 24 mg/dL (ref 5–40)

## 2016-11-21 LAB — HEMOGLOBIN A1C
Est. average glucose Bld gHb Est-mCnc: 111 mg/dL
Hgb A1c MFr Bld: 5.5 % (ref 4.8–5.6)

## 2016-11-21 LAB — VITAMIN D 25 HYDROXY (VIT D DEFICIENCY, FRACTURES): VIT D 25 HYDROXY: 37 ng/mL (ref 30.0–100.0)

## 2016-11-21 LAB — INSULIN, RANDOM: INSULIN: 38.7 u[IU]/mL — ABNORMAL HIGH (ref 2.6–24.9)

## 2016-11-21 MED FILL — METOPROLOL SUCC ER 25 MG TA: 25 | 60 days supply | Qty: 30 | Fill #0

## 2016-11-25 MED ORDER — VITAMIN D (ERGOCALCIFEROL) 1.25 MG (50000 UNIT) PO CAPS: 50000.0000 [IU] | ORAL_CAPSULE | ORAL | 0 refills | Status: DC

## 2016-11-25 MED FILL — VIT D2 1.25 MG (50,000 UNIT: 1.25 MG | 28 days supply | Qty: 4 | Fill #0

## 2016-11-25 NOTE — Progress Notes (Signed)
Office: [phone]  /  Fax: [phone]   HPI:   Chief Complaint: OBESITY Julian Washington is here to discuss his progress with his obesity treatment plan. He is on the keep a food journal with 450 to 650 calories and 40 grams of protein at supper daily and follow the Category 3 plan and is following his eating plan approximately 70 % of the time. He states he is exercising 0 minutes 0 times per week. [name] continues to do well with weight loss. He has had increased temptations in the last 2 to 3  But was able to control his portions and make smarter food choices. He is journaling more days than not. His weight is (!) 345 lb (156.5 kg) today and has had a weight loss of 4 pounds over a period of 3 weeks since his last visit. He has lost 51 lbs since starting treatment with [place].  Vitamin D deficiency Julian Washington has a diagnosis of vitamin D deficiency. He is currently stable on vit D and is due for labs. He denies nausea, vomiting or muscle weakness.  Diabetes II Julian Washington has a diagnosis of diabetes type II. Julian Washington denies any hypoglycemic episodes. Julian Washington is currently stable on metformin.  He has been working on intensive lifestyle modifications including diet, exercise, and weight loss to help control his blood glucose levels. Julian Washington is due for labs.  At risk for cardiovascular disease Julian Washington is at a higher than average risk for cardiovascular disease due to obesity and diabetes. He currently denies any chest pain.  Elevated Vitamin B12 level Julian Washington has been on vitamin B12 supplement and level is now over replaced. Julian Washington is now off of vitamin B12 supplement and is due for labs. He is eating a B12 rich diet.  Iron Deficiency Anemia Julian Washington has a diagnosis of anemia. He is on an iron rich diet and fatigue is slowly improving. Julian Washington is due for labs.  ALLERGIES: Allergies  Allergen Reactions  . Levaquin [Levofloxacin In D5w] Swelling  . Celexa [Citalopram] Other (See Comments)    Mental status  changes   . Dextrans Other (See Comments)    Makes the pt. Drowsy.   . Hydrocodone-Acetaminophen     REACTION: itching  . Latex   . Oxycodone Itching    MEDICATIONS: Current Outpatient Prescriptions on File Prior to Visit  Medication Sig Dispense Refill  . ACCU-CHEK FASTCLIX LANCETS MISC by Does not apply route.    . B Complex Vitamins (B COMPLEX-B12 PO) Take 1 tablet by mouth daily.     Marland Kitchen buPROPion (WELLBUTRIN SR) 150 MG 12 hr tablet TAKE 1 TABLET (150 MG TOTAL) BY MOUTH DAILY. 30 tablet 0  . fexofenadine (ALLEGRA) 180 MG tablet Take 180 mg by mouth daily.    . fluticasone (FLONASE) 50 MCG/ACT nasal spray Place 2 sprays into both nostrils daily.    Marland Kitchen glucose blood (ACCU-CHEK GUIDE) test strip 1 each by Other route 2 (two) times daily. Use as instructed    . liraglutide 18 MG/3ML SOPN Inject 0.3 mLs (1.8 mg total) into the skin every morning. 3 pen 0  . Magnesium Oxide (MAG-OXIDE PO) Take 500 mg by mouth daily.    . metFORMIN (GLUCOPHAGE-XR) 500 MG 24 hr tablet TAKE 2 TABLETS BY MOUTH DAILY WITH BREAKFAST. 180 tablet 0  . metoprolol succinate (TOPROL-XL) 25 MG 24 hr tablet Take 25 mg by mouth daily. As needed    . Multiple Vitamin (MULTIVITAMIN) tablet Take 1 tablet by mouth daily.     Marland Kitchen  pantoprazole (PROTONIX) 40 MG tablet Take 1 tablet (40 mg total) by mouth daily. 90 tablet 3  . verapamil (CALAN-SR) 240 MG CR tablet TAKE 1 TABLET BY MOUTH AT BEDTIME 90 tablet 1  . vitamin B-12 (CYANOCOBALAMIN) 250 MCG tablet Take 500 mcg by mouth daily.     . metoprolol tartrate (LOPRESSOR) 25 MG tablet Take 0.5 tablets (12.5 mg total) by mouth daily as needed (PALPITATIONS). 30 tablet 6   No current facility-administered medications on file prior to visit.     PAST MEDICAL HISTORY: Past Medical History:  Diagnosis Date  . Adenoma    Piturity  . Anemia   . Anxiety   . Anxiety and depression    h/o suicidality w/ citalopram  . Back pain   . Chest pain   . Depression   . Difficult  intubation   . ETD (eustachian tube dysfunction)    s/p ENT, declined ear tuves before   . GERD (gastroesophageal reflux disease)   . History of chicken pox    Titered on 08/16/2005  . Hypogonadism male    Dr Cruzita Lederer, used to see urology  . Hypogonadism male   . Infertility male   . Influenza 02/2016  . Internal hemorrhoids    s/p banding  . Low testosterone   . Migraine    on topamax  . OSA on CPAP   . Palpitations   . 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 Prediabetes    A1C 6.2 years ago  . 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 Shortness of breath   . Sleep apnea   . Swelling   . Vitamin B 12 deficiency    h/o  . Vitamin D deficiency     PAST SURGICAL HISTORY: Past Surgical History:  Procedure Laterality Date  . 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 TONSILLECTOMY AND ADENOIDECTOMY      SOCIAL HISTORY: Social History  Substance Use Topics  . Smoking status: Never Smoker  . Smokeless tobacco: Never Used  . Alcohol use 0.0 oz/week     Comment: occ    FAMILY HISTORY: Family History  Problem Relation Age of Onset  . Obesity Mother   . Depression Father   . Anxiety disorder Father   . Bipolar disorder Father   . Drug abuse Father   . Diabetes Paternal Grandmother   . Hypertension Paternal Grandmother   . Diabetes Paternal Grandfather   . Prostate cancer Other        GF  . CAD Other        PGF  . Colon cancer Neg Hx     ROS: Review of Systems  Constitutional: Positive for malaise/fatigue and weight loss.  Cardiovascular: Negative for chest pain.  Gastrointestinal: Negative for nausea and vomiting.  Musculoskeletal:       Negative muscle weakness  Endo/Heme/Allergies:       Negative hypoglycemia    PHYSICAL EXAM: Blood pressure 112/72, pulse 78, temperature 98.2 F (36.8 C), temperature source Oral, height 5\' 9"  (1.753 m), weight (!) 345 lb (156.5 kg), SpO2 98  %. Body mass index is 50.95 kg/m. Physical Exam  Constitutional: He is oriented to person, place, and time. He appears well-developed and well-nourished.  Cardiovascular: Normal rate.   Pulmonary/Chest: Effort normal.  Musculoskeletal: Normal range of motion.  Neurological: He is oriented to person, place, and time.  Skin: Skin is  warm and dry.  Psychiatric: He has a normal mood and affect. His behavior is normal.  Vitals reviewed.   RECENT LABS AND TESTS: BMET    Component Value Date/Time   NA 141 11/20/2016 0811   K 4.5 11/20/2016 0811   CL 107 (H) 11/20/2016 0811   CO2 21 11/20/2016 0811   GLUCOSE 100 (H) 11/20/2016 0811   GLUCOSE 153 (H) 05/06/2016 1126   BUN 18 11/20/2016 0811   CREATININE 1.11 11/20/2016 0811   CREATININE 0.73 01/19/2016 1624   CALCIUM 9.1 11/20/2016 0811   GFRNONAA 86 11/20/2016 0811   GFRAA 99 11/20/2016 0811   Lab Results  Component Value Date   HGBA1C 5.5 11/20/2016   HGBA1C 6.2 (H) 07/11/2016   HGBA1C 5.7 01/10/2016   HGBA1C 5.8 07/06/2015   HGBA1C 6.2 01/25/2015   Lab Results  Component Value Date   INSULIN 38.7 (H) 11/20/2016   INSULIN 36.7 (H) 07/11/2016   CBC    Component Value Date/Time   WBC 7.0 11/20/2016 0811   WBC 8.6 05/06/2016 1126   RBC 4.58 11/20/2016 0811   RBC 4.73 05/06/2016 1126   HGB 12.1 (L) 11/20/2016 0811   HCT 36.9 (L) 11/20/2016 0811   PLT 197.0 05/06/2016 1126   MCV 81 11/20/2016 0811   MCH 26.4 (L) 11/20/2016 0811   MCH 27.4 02/05/2016 1730   MCHC 32.8 11/20/2016 0811   MCHC 33.1 05/06/2016 1126   RDW 14.8 11/20/2016 0811   LYMPHSABS 1.9 11/20/2016 0811   MONOABS 0.5 05/06/2016 1126   EOSABS 0.1 11/20/2016 0811   BASOSABS 0.0 11/20/2016 0811   Iron/TIBC/Ferritin/ %Sat    Component Value Date/Time   IRON 37 (L) 11/20/2016 0811   TIBC 246 (L) 11/20/2016 0811   FERRITIN 156 11/20/2016 0811   IRONPCTSAT 15 11/20/2016 0811   Lipid Panel     Component Value Date/Time   CHOL 122 11/20/2016 0811    TRIG 118 11/20/2016 0811   HDL 33 (L) 11/20/2016 0811   CHOLHDL 3 07/26/2015 1030   VLDL 12.0 07/26/2015 1030   LDLCALC 65 11/20/2016 0811   Hepatic Function Panel     Component Value Date/Time   PROT 6.4 11/20/2016 0811   ALBUMIN 4.4 11/20/2016 0811   AST 10 11/20/2016 0811   ALT 14 11/20/2016 0811   ALKPHOS 77 11/20/2016 0811   BILITOT 0.2 11/20/2016 0811      Component Value Date/Time   TSH 2.950 07/11/2016 1018   TSH 2.275 02/05/2016 1730   TSH 1.27 01/10/2016 0835   TSH 2.71 07/08/2014 0848    ASSESSMENT AND PLAN: Type 2 diabetes mellitus without complication, without long-term current use of insulin ( [place]) - Plan: Comprehensive metabolic panel, Hemoglobin A1c, Insulin, random, Lipid Panel With LDL/HDL Ratio  Vitamin D deficiency - Plan: VITAMIN D 25 Hydroxy (Vit-D Deficiency, Fractures), Vitamin D, Ergocalciferol, (DRISDOL) 50000 units CAPS capsule  High serum vitamin B12 - Plan: Vitamin B12  Other iron deficiency anemia - Plan: Anemia panel, CBC With Differential  At risk for heart disease  Class 3 severe obesity with serious comorbidity and body mass index (BMI) of 50.0 to 59.9 in adult, unspecified obesity type (HCC)  PLAN:  Vitamin D Deficiency Julian Washington was informed that low vitamin D levels contributes to fatigue and are associated with obesity, breast, and colon cancer. He agrees to continue to take prescription Vit D @50 ,000 IU every week #4 with no refills. We will check labs and will follow up for routine testing of vitamin  D, at least 2-3 times per year. He was informed of the risk of over-replacement of vitamin D and agrees to not increase his dose unless he discusses this with [place] first. Kasen agrees to follow up with our clinic in 2 to 3 weeks.  Diabetes II Julian Washington has been given extensive diabetes education by myself today including ideal fasting and post-prandial blood glucose readings, individual ideal Hgb A1c goals  and hypoglycemia prevention. We  discussed the importance of good blood sugar control to decrease the likelihood of diabetic complications such as nephropathy, neuropathy, limb loss, blindness, coronary artery disease, and death. We discussed the importance of intensive lifestyle modification including diet, exercise and weight loss as the first line treatment for diabetes. We will check labs and Julian Washington agrees to continue his diabetes medications and will follow up at the agreed upon time.  Cardiovascular risk counseling Julian Washington was given extended (15 minutes) coronary artery disease prevention counseling today. He is 36 y.o. male and has risk factors for heart disease including obesity and diabetes. We discussed intensive lifestyle modifications today with an emphasis on specific weight loss instructions and strategies. Pt was also informed of the importance of increasing exercise and decreasing saturated fats to help prevent heart disease.  Elevated Vitamin B12 level We will check labs and Julian Washington agrees to follow up with our clinic in 2 to 3 weeks.  Iron Deficiency Anemia The diagnosis of Iron deficiency anemia was discussed with Julian Washington and was explained in detail. He was given suggestions of iron rich foods and iron supplement was not prescribed. We will check labs and Julian Washington will follow up with our clinic in 2 to 3 weeks.  Obesity Julian Washington is currently in the action stage of change. As such, his goal is to continue with weight loss efforts He has agreed to keep a food journal with 1800 calories and 100+ grams of protein daily Julian Washington has been instructed to work up to a goal of 150 minutes of combined cardio and strengthening exercise per week for weight loss and overall health benefits. We discussed the following Behavioral Modification Strategies today: no skipping meals, celebration eating strategies, increasing lean protein intake, decreasing simple carbohydrates  and decrease eating out  Julian Washington has agreed to follow up with our  clinic in 2 to 3 weeks. He was informed of the importance of frequent follow up visits to maximize his success with intensive lifestyle modifications for his multiple health conditions.  I, Julian Washington, am acting as transcriptionist for Dennard Nip, MD  I have reviewed the above documentation for accuracy and completeness, and I agree with the above. -Dennard Nip, MD   OBESITY BEHAVIORAL INTERVENTION VISIT  Today's visit was # 8 out of 22.  Starting weight: 396 lbs Starting date: 07/11/16 Today's weight : 345 lbs Today's date: 11/20/2016 Total lbs lost to date: 89 (Patients must lose 7 lbs in the first 6 months to continue with counseling)   ASK: We discussed the diagnosis of obesity with Harl Favor today and Abran agreed to give [place] permission to discuss obesity behavioral modification therapy today.  ASSESS: Collin has the diagnosis of obesity and his BMI today is 50.92 Kasim is in the action stage of change   ADVISE: Emanual was educated on the multiple health risks of obesity as well as the benefit of weight loss to improve his health. He was advised of the need for long term treatment and the importance of lifestyle modifications.  AGREE: Multiple dietary modification options and  treatment options were discussed and  Jagjit agreed to keep a food journal with 1800 calories and 100+ grams of protein daily We discussed the following Behavioral Modification Strategies today: no skipping meals, celebration eating strategies, increasing lean protein intake, decreasing simple carbohydrates  and decrease eating out

## 2016-11-29 ENCOUNTER — Other Ambulatory Visit: Payer: Self-pay | Admitting: Internal Medicine

## 2016-11-29 MED FILL — METFORMIN HCL ER 500 MG TAB: 500 | 90 days supply | Qty: 180 | Fill #0

## 2016-12-04 ENCOUNTER — Ambulatory Visit (INDEPENDENT_AMBULATORY_CARE_PROVIDER_SITE_OTHER): Payer: 59 | Admitting: Physician Assistant

## 2016-12-04 VITALS — BP 120/77 | HR 80 | Temp 98.3°F | Ht 69.0 in | Wt 347.0 lb

## 2016-12-04 DIAGNOSIS — Z6841 Body Mass Index (BMI) 40.0 and over, adult: Secondary | ICD-10-CM

## 2016-12-04 DIAGNOSIS — F3289 Other specified depressive episodes: Secondary | ICD-10-CM

## 2016-12-04 DIAGNOSIS — D508 Other iron deficiency anemias: Secondary | ICD-10-CM | POA: Diagnosis not present

## 2016-12-04 DIAGNOSIS — Z9189 Other specified personal risk factors, not elsewhere classified: Secondary | ICD-10-CM

## 2016-12-04 DIAGNOSIS — I10 Essential (primary) hypertension: Secondary | ICD-10-CM

## 2016-12-04 DIAGNOSIS — E119 Type 2 diabetes mellitus without complications: Secondary | ICD-10-CM | POA: Diagnosis not present

## 2016-12-04 DIAGNOSIS — D649 Anemia, unspecified: Secondary | ICD-10-CM | POA: Insufficient documentation

## 2016-12-04 MED ORDER — LIRAGLUTIDE 18 MG/3ML ~~LOC~~ SOPN: 1.8000 mg | PEN_INJECTOR | Freq: Every morning | SUBCUTANEOUS | 0 refills | Status: DC

## 2016-12-04 MED ORDER — BUPROPION HCL ER (SR) 150 MG PO TB12: 150.0000 mg | ORAL_TABLET | Freq: Every day | ORAL | 0 refills | Status: DC

## 2016-12-04 NOTE — Progress Notes (Signed)
Office: [phone]  /  Fax: [phone]   HPI:   Chief Complaint: OBESITY Julian Washington is here to discuss his progress with his obesity treatment plan. He is on the keep a food journal with 1800 calories and 100+ grams of protein daily and is following his eating plan approximately 40 % of the time. He states he is exercising 0 minutes 0 times per week. Julian Washington has been busier with church activities and long work hours and has found it harder to plan his dinners. He is motivated to get back on track.  His weight is (!) 347 lb (157.4 kg) today and has gained 2 pounds since his last visit. He has lost 49 lbs since starting treatment with [place].  Diabetes II Julian Washington has a diagnosis of diabetes type II. Julian Washington states he is not checking BGs at home and denies any hypoglycemic episodes. Last A1c was 5.5 on 11/20/16. He has been working on intensive lifestyle modifications including diet, exercise, and weight loss to help control his blood glucose levels.  Iron Deficiency Anemia Julian Washington has a diagnosis of iron deficiency anemia. His Hgb is 12.1 and he denies any hematochezia, melena, chest pain or dyspnea. He is not on iron supplementation.   Hypertension Julian Washington is a 36 y.o. male with hypertension.  Julian Washington denies chest pain or shortness of breath on exertion. He is working weight loss to help control his blood pressure with the goal of decreasing his risk of heart attack and stroke. Julian Washington blood pressure is stable.  At risk for cardiovascular disease Julian Washington is at a higher than average risk for cardiovascular disease due to obesity and hypertension. He currently denies any chest pain.  Depression with emotional eating behaviors Julian Washington is struggling with emotional eating and using food for comfort to the extent that it is negatively impacting his health. He often snacks when he is not hungry. Julian Washington sometimes feels he is out of control and then feels guilty that he made poor food choices. He  has been working on behavior modification techniques to help reduce his emotional eating and has been somewhat successful. His mood is stable and he shows no sign of suicidal or homicidal ideations.  Depression screen Julian Washington 2/9 07/11/2016 01/17/2016 08/31/2015 07/26/2015 04/06/2015  Decreased Interest 2 0 0 0 0  Down, Depressed, Hopeless 2 0 0 0 0  PHQ - 2 Score 4 0 0 0 0  Altered sleeping 1 - - - -  Tired, decreased energy 3 - - - -  Change in appetite 3 - - - -  Feeling bad or failure about yourself  2 - - - -  Trouble concentrating 2 - - - -  Moving slowly or fidgety/restless 3 - - - -  Suicidal thoughts 1 - - - -  PHQ-9 Score 19 - - - -       ALLERGIES: Allergies  Allergen Reactions  . Levaquin [Levofloxacin In D5w] Swelling  . Celexa [Citalopram] Other (See Comments)    Mental status changes   . Dextrans Other (See Comments)    Makes the pt. Drowsy.   . Hydrocodone-Acetaminophen     REACTION: itching  . Latex   . Oxycodone Itching    MEDICATIONS: Current Outpatient Prescriptions on File Prior to Visit  Medication Sig Dispense Refill  . ACCU-CHEK FASTCLIX LANCETS MISC by Does not apply route.    . B Complex Vitamins (B COMPLEX-B12 PO) Take 1 tablet by mouth daily.     Marland Kitchen  fexofenadine (ALLEGRA) 180 MG tablet Take 180 mg by mouth daily.    . fluticasone (FLONASE) 50 MCG/ACT nasal spray Place 2 sprays into both nostrils daily.    Marland Kitchen glucose blood (ACCU-CHEK GUIDE) test strip 1 each by Other route 2 (two) times daily. Use as instructed    . Magnesium Oxide (MAG-OXIDE PO) Take 500 mg by mouth daily.    . metFORMIN (GLUCOPHAGE-XR) 500 MG 24 hr tablet TAKE 2 TABLETS BY MOUTH DAILY WITH BREAKFAST. 180 tablet 0  . metoprolol succinate (TOPROL-XL) 25 MG 24 hr tablet Take 25 mg by mouth daily. As needed    . Multiple Vitamin (MULTIVITAMIN) tablet Take 1 tablet by mouth daily.     . pantoprazole (PROTONIX) 40 MG tablet Take 1 tablet (40 mg total) by mouth daily. 90 tablet 3  . verapamil  (CALAN-SR) 240 MG CR tablet TAKE 1 TABLET BY MOUTH AT BEDTIME 90 tablet 1  . vitamin B-12 (CYANOCOBALAMIN) 250 MCG tablet Take 500 mcg by mouth once a week.     . Vitamin D, Ergocalciferol, (DRISDOL) 50000 units CAPS capsule Take 1 capsule (50,000 Units total) by mouth every 7 (seven) days. 4 capsule 0  . metoprolol tartrate (LOPRESSOR) 25 MG tablet Take 0.5 tablets (12.5 mg total) by mouth daily as needed (PALPITATIONS). 30 tablet 6   No current facility-administered medications on file prior to visit.     PAST MEDICAL HISTORY: Past Medical History:  Diagnosis Date  . Adenoma    Piturity  . Anemia   . Anxiety   . Anxiety and depression    h/o suicidality w/ citalopram  . Back pain   . Chest pain   . Depression   . Difficult intubation   . ETD (eustachian tube dysfunction)    s/p ENT, declined ear tuves before   . GERD (gastroesophageal reflux disease)   . History of chicken pox    Titered on 08/16/2005  . Hypogonadism male    Dr Cruzita Lederer, used to see urology  . Hypogonadism male   . Infertility male   . Influenza 02/2016  . Internal hemorrhoids    s/p banding  . Low testosterone   . Migraine    on topamax  . OSA on CPAP   . Palpitations   . 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 Prediabetes    A1C 6.2 years ago  . 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 Shortness of breath   . Sleep apnea   . Swelling   . Vitamin B 12 deficiency    h/o  . Vitamin D deficiency     PAST SURGICAL HISTORY: Past Surgical History:  Procedure Laterality Date  . 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 TONSILLECTOMY AND ADENOIDECTOMY      SOCIAL HISTORY: Social History  Substance Use Topics  . Smoking status: Never Smoker  . Smokeless tobacco: Never Used  . Alcohol use 0.0 oz/week     Comment: occ    FAMILY HISTORY: Family History  Problem Relation Age of Onset  . Obesity Mother   . Depression  Father   . Anxiety disorder Father   . Bipolar disorder Father   . Drug abuse Father   . Diabetes Paternal Grandmother   . Hypertension Paternal Grandmother   . Diabetes Paternal Grandfather   . Prostate cancer Other        GF  .  CAD Other        PGF  . Colon cancer Neg Hx     ROS: Review of Systems  Constitutional: Negative for weight loss.  Respiratory: Negative for shortness of breath.   Cardiovascular: Negative for chest pain.  Gastrointestinal: Negative for melena.       Negative hematochezia  Endo/Heme/Allergies:       Negative hypoglycemia  Psychiatric/Behavioral: Positive for depression. Negative for suicidal ideas.    PHYSICAL EXAM: Blood pressure 120/77, pulse 80, temperature 98.3 F (36.8 C), temperature source Oral, height 5\' 9"  (1.753 m), weight (!) 347 lb (157.4 kg), SpO2 97 %. Body mass index is 51.24 kg/m. Physical Exam  Constitutional: He is oriented to person, place, and time. He appears well-developed and well-nourished.  Cardiovascular: Normal rate.   Pulmonary/Chest: Effort normal.  Musculoskeletal: Normal range of motion.  Neurological: He is oriented to person, place, and time.  Skin: Skin is warm and dry.  Psychiatric: He has a normal mood and affect.  Vitals reviewed.   RECENT LABS AND TESTS: BMET    Component Value Date/Time   NA 141 11/20/2016 0811   K 4.5 11/20/2016 0811   CL 107 (H) 11/20/2016 0811   CO2 21 11/20/2016 0811   GLUCOSE 100 (H) 11/20/2016 0811   GLUCOSE 153 (H) 05/06/2016 1126   BUN 18 11/20/2016 0811   CREATININE 1.11 11/20/2016 0811   CREATININE 0.73 01/19/2016 1624   CALCIUM 9.1 11/20/2016 0811   GFRNONAA 86 11/20/2016 0811   GFRAA 99 11/20/2016 0811   Lab Results  Component Value Date   HGBA1C 5.5 11/20/2016   HGBA1C 6.2 (H) 07/11/2016   HGBA1C 5.7 01/10/2016   HGBA1C 5.8 07/06/2015   HGBA1C 6.2 01/25/2015   Lab Results  Component Value Date   INSULIN 38.7 (H) 11/20/2016   INSULIN 36.7 (H) 07/11/2016     CBC    Component Value Date/Time   WBC 7.0 11/20/2016 0811   WBC 8.6 05/06/2016 1126   RBC 4.58 11/20/2016 0811   RBC 4.73 05/06/2016 1126   HGB 12.1 (L) 11/20/2016 0811   HCT 36.9 (L) 11/20/2016 0811   PLT 197.0 05/06/2016 1126   MCV 81 11/20/2016 0811   MCH 26.4 (L) 11/20/2016 0811   MCH 27.4 02/05/2016 1730   MCHC 32.8 11/20/2016 0811   MCHC 33.1 05/06/2016 1126   RDW 14.8 11/20/2016 0811   LYMPHSABS 1.9 11/20/2016 0811   MONOABS 0.5 05/06/2016 1126   EOSABS 0.1 11/20/2016 0811   BASOSABS 0.0 11/20/2016 0811   Iron/TIBC/Ferritin/ %Sat    Component Value Date/Time   IRON 37 (L) 11/20/2016 0811   TIBC 246 (L) 11/20/2016 0811   FERRITIN 156 11/20/2016 0811   IRONPCTSAT 15 11/20/2016 0811   Lipid Panel     Component Value Date/Time   CHOL 122 11/20/2016 0811   TRIG 118 11/20/2016 0811   HDL 33 (L) 11/20/2016 0811   CHOLHDL 3 07/26/2015 1030   VLDL 12.0 07/26/2015 1030   LDLCALC 65 11/20/2016 0811   Hepatic Function Panel     Component Value Date/Time   PROT 6.4 11/20/2016 0811   ALBUMIN 4.4 11/20/2016 0811   AST 10 11/20/2016 0811   ALT 14 11/20/2016 0811   ALKPHOS 77 11/20/2016 0811   BILITOT 0.2 11/20/2016 0811      Component Value Date/Time   TSH 2.950 07/11/2016 1018   TSH 2.275 02/05/2016 1730   TSH 1.27 01/10/2016 0835   TSH 2.71 07/08/2014 0848  ASSESSMENT AND PLAN: Type 2 diabetes mellitus without complication, without long-term current use of insulin ([place]) - Plan: liraglutide 18 MG/3ML SOPN  Other iron deficiency anemia  Essential hypertension  Other depression - with emotional eating - Plan: buPROPion (WELLBUTRIN SR) 150 MG 12 hr tablet  At risk for heart disease  Class 3 severe obesity with serious comorbidity and body mass index (BMI) of 50.0 to 59.9 in adult, unspecified obesity type ([place])  PLAN:  Diabetes II Julian Washington has been given extensive diabetes education by myself today including ideal fasting and post-prandial blood  glucose readings, individual ideal Hgb A1c goals  and hypoglycemia prevention. We discussed the importance of good blood sugar control to decrease the likelihood of diabetic complications such as nephropathy, neuropathy, limb loss, blindness, coronary artery disease, and death. We discussed the importance of intensive lifestyle modification including diet, exercise and weight loss as the first line treatment for diabetes. Julian Washington agrees to continue Victoza 3 pens (Pt @ 1.8 mg) and we will refill for 1 month. Julian Washington agrees to follow up with our clinic in 2 weeks.   Iron Deficiency Anemia The diagnosis of Iron deficiency anemia was discussed with Julian Washington and was explained in detail. He was given suggestions of iron rich foods and and iron supplement was prescribed. Julian Washington agrees to start Iron 325.  Hypertension We discussed sodium restriction, working on healthy weight loss, and a regular exercise program as the means to achieve improved blood pressure control. Julian Washington agreed with this plan and agreed to follow up as directed. We will continue to monitor his blood pressure as well as his progress with the above lifestyle modifications. He will continue his medications as prescribed and will watch for signs of hypotension as he continues his lifestyle modifications. Traye agrees to follow up with our clinic in 2 weeks.  Cardiovascular risk counselling Julian Washington was given extended (15 minutes) coronary artery disease prevention counseling today. He is 36 y.o. male and has risk factors for heart disease including obesity. We discussed intensive lifestyle modifications today with an emphasis on specific weight loss instructions and strategies. Pt was also informed of the importance of increasing exercise and decreasing saturated fats to help prevent heart disease.  Depression with Emotional Eating Behaviors We discussed behavior modification techniques today to help Julian Washington deal with his emotional eating and  depression. Julian Washington agrees to continue taking Wellbutrin SR 150 mg qd #30 and we will refill for 1 month. Julian Washington agrees to follow up with our clinic in 2 weeks.  Obesity Julian Washington is currently in the action stage of change. As such, his goal is to continue with weight loss efforts He has agreed to keep a food journal with 1800 calories and 100+ grams of protein daily Julian Washington has been instructed to work up to a goal of 150 minutes of combined cardio and strengthening exercise per week for weight loss and overall health benefits. We discussed the following Behavioral Modification Strategies today: increasing lean protein intake and decrease eating out   Julian Washington has agreed to follow up with our clinic in 2 weeks. He was informed of the importance of frequent follow up visits to maximize his success with intensive lifestyle modifications for his multiple health conditions.  I, Trixie Dredge, am acting as transcriptionist for Julian Duverney, PA-C  I have reviewed the above documentation for accuracy and completeness, and I agree with the above. -Julian Duverney, PA-C  I have reviewed the above note and agree with the plan. -Dennard Nip, MD  Today's visit was # 9 out of 22.  Starting weight: 396 lbs Starting date: 07/11/16 Today's weight : 347 lbs  Today's date: 12/04/2016 Total lbs lost to date: 63 (Patients must lose 7 lbs in the first 6 months to continue with counseling)   ASK: We discussed the diagnosis of obesity with Julian Washington today and Lijah agreed to give [place] permission to discuss obesity behavioral modification therapy today.  ASSESS: Apollo has the diagnosis of obesity and his BMI today is 51.22 Xylon is in the action stage of change   ADVISE: Kodie was educated on the multiple health risks of obesity as well as the benefit of weight loss to improve his health. He was advised of the need for long term treatment and the importance of lifestyle  modifications.  AGREE: Multiple dietary modification options and treatment options were discussed and  Excell agreed to keep a food journal with 1800 calories and 100+ grams of protein daily We discussed the following Behavioral Modification Strategies today: increasing lean protein intake and decrease eating out

## 2016-12-06 ENCOUNTER — Other Ambulatory Visit (INDEPENDENT_AMBULATORY_CARE_PROVIDER_SITE_OTHER): Payer: Self-pay | Admitting: Family Medicine

## 2016-12-06 DIAGNOSIS — E119 Type 2 diabetes mellitus without complications: Secondary | ICD-10-CM

## 2016-12-06 MED FILL — VICTOZA 18 MG/3 ML INJECT P: 18 | 30 days supply | Qty: 9 | Fill #0

## 2016-12-09 ENCOUNTER — Encounter (INDEPENDENT_AMBULATORY_CARE_PROVIDER_SITE_OTHER): Payer: Self-pay | Admitting: Physician Assistant

## 2016-12-09 ENCOUNTER — Other Ambulatory Visit (INDEPENDENT_AMBULATORY_CARE_PROVIDER_SITE_OTHER): Payer: Self-pay | Admitting: Physician Assistant

## 2016-12-09 ENCOUNTER — Other Ambulatory Visit (INDEPENDENT_AMBULATORY_CARE_PROVIDER_SITE_OTHER): Payer: Self-pay

## 2016-12-09 DIAGNOSIS — D508 Other iron deficiency anemias: Secondary | ICD-10-CM

## 2016-12-09 MED ORDER — FERROUS SULFATE 325 (65 FE) MG PO TABS: 325.0000 mg | ORAL_TABLET | Freq: Every day | ORAL | 0 refills | Status: DC

## 2016-12-09 MED FILL — TECHLITE PEN NDL 32GX1/4: 32G X 6 MM | 90 days supply | Qty: 100 | Fill #0

## 2016-12-09 MED FILL — FERROUS SULFATE 325 MG TAB: 325 (65 FE) | 100 days supply | Qty: 100 | Fill #0

## 2016-12-09 MED FILL — TECHLITE PEN NDL 32GX1/4": 32G X 6 MM | 90 days supply | Qty: 100 | Fill #0

## 2016-12-13 MED FILL — BUPROPION SR 150 MG TABLET: 150 | 30 days supply | Qty: 30 | Fill #0

## 2016-12-18 ENCOUNTER — Encounter (INDEPENDENT_AMBULATORY_CARE_PROVIDER_SITE_OTHER): Payer: Self-pay

## 2016-12-18 ENCOUNTER — Ambulatory Visit (INDEPENDENT_AMBULATORY_CARE_PROVIDER_SITE_OTHER): Payer: 59 | Admitting: Physician Assistant

## 2016-12-30 ENCOUNTER — Other Ambulatory Visit: Payer: Self-pay | Admitting: Internal Medicine

## 2016-12-30 MED FILL — VERAPAMIL ER 240 MG TABLET: 240 | 90 days supply | Qty: 90 | Fill #0 | Status: TO

## 2016-12-30 MED FILL — PANTOPRAZOLE SOD DR 40 MG T: 40 | 90 days supply | Qty: 90 | Fill #0

## 2017-01-01 ENCOUNTER — Ambulatory Visit (INDEPENDENT_AMBULATORY_CARE_PROVIDER_SITE_OTHER): Payer: 59 | Admitting: Physician Assistant

## 2017-01-08 ENCOUNTER — Ambulatory Visit (INDEPENDENT_AMBULATORY_CARE_PROVIDER_SITE_OTHER): Payer: 59 | Admitting: Physician Assistant

## 2017-01-08 VITALS — BP 130/84 | HR 73 | Temp 97.6°F | Ht 69.0 in | Wt 359.0 lb

## 2017-01-08 DIAGNOSIS — E559 Vitamin D deficiency, unspecified: Secondary | ICD-10-CM | POA: Diagnosis not present

## 2017-01-08 DIAGNOSIS — Z9189 Other specified personal risk factors, not elsewhere classified: Secondary | ICD-10-CM

## 2017-01-08 DIAGNOSIS — E119 Type 2 diabetes mellitus without complications: Secondary | ICD-10-CM | POA: Diagnosis not present

## 2017-01-08 DIAGNOSIS — F3289 Other specified depressive episodes: Secondary | ICD-10-CM

## 2017-01-08 DIAGNOSIS — Z6841 Body Mass Index (BMI) 40.0 and over, adult: Secondary | ICD-10-CM

## 2017-01-08 MED ORDER — LIRAGLUTIDE 18 MG/3ML ~~LOC~~ SOPN: 1.8000 mg | PEN_INJECTOR | Freq: Every morning | SUBCUTANEOUS | 0 refills | Status: DC

## 2017-01-08 MED ORDER — VITAMIN D (ERGOCALCIFEROL) 1.25 MG (50000 UNIT) PO CAPS: 50000.0000 [IU] | ORAL_CAPSULE | ORAL | 0 refills | Status: DC

## 2017-01-08 MED ORDER — BUPROPION HCL ER (SR) 150 MG PO TB12: 150.0000 mg | ORAL_TABLET | Freq: Every day | ORAL | 0 refills | Status: DC

## 2017-01-08 MED FILL — VIT D2 1.25 MG (50,000 UNIT: 1.25 MG | 28 days supply | Qty: 4 | Fill #0

## 2017-01-08 MED FILL — VICTOZA 18 MG/3 ML INJECT P: 18 | 30 days supply | Qty: 9 | Fill #0

## 2017-01-08 MED FILL — BUPROPION SR 150 MG TABLET: 150 | 30 days supply | Qty: 30 | Fill #0

## 2017-01-08 NOTE — Progress Notes (Signed)
Office: [phone]  /  Fax: [phone]   HPI:   Chief Complaint: OBESITY Julian Washington is here to discuss his progress with his obesity treatment plan. He is on the keep a food journal with 1800 calories and 100+ grams of protein daily protein  and is following his eating plan approximately 10 % of the time. He states he is exercising 0 minutes 0 times per week. [name] states he has not been mindful of his eating and is not following the plan. He is motivated to get back on track and continue weight loss. He would like to try a different eating plan. His weight is (!) 359 lb (162.8 kg) today and has had a weight gain of 12 pounds over a period of 5 weeks since his last visit. He has lost 37 lbs since starting treatment with [place].  Diabetes II [name] has a diagnosis of diabetes type II. [name] is not checking BGs at home and denies any hypoglycemic episodes. He has been working on intensive lifestyle modifications including diet, exercise, and weight loss to help control his blood glucose levels.  Vitamin D deficiency [name] has a diagnosis of vitamin D deficiency. He is currently taking vit D and denies nausea, vomiting or muscle weakness.  At risk for osteopenia and osteoporosis [name] is at higher risk of osteopenia and osteoporosis due to vitamin D deficiency.   Depression with emotional eating behaviors [name] is struggling with emotional eating and using food for comfort to the extent that it is negatively impacting his health. He often snacks when he is not hungry. [name] sometimes feels he is out of control and then feels guilty that he made poor food choices. He has been working on behavior modification techniques to help reduce his emotional eating and has been somewhat successful. His mood is stable and he shows no sign of suicidal or homicidal ideations.  Depression screen [name] 2/9 07/11/2016 01/17/2016 08/31/2015 07/26/2015 04/06/2015  Decreased Interest 2 0 0 0 0  Down, Depressed,  Hopeless 2 0 0 0 0  PHQ - 2 Score 4 0 0 0 0  Altered sleeping 1 - - - -  Tired, decreased energy 3 - - - -  Change in appetite 3 - - - -  Feeling bad or failure about yourself  2 - - - -  Trouble concentrating 2 - - - -  Moving slowly or fidgety/restless 3 - - - -  Suicidal thoughts 1 - - - -  PHQ-9 Score 19 - - - -     ALLERGIES: Allergies  Allergen Reactions  . Levaquin [Levofloxacin In D5w] Swelling  . Celexa [Citalopram] Other (See Comments)    Mental status changes   . Dextrans Other (See Comments)    Makes the pt. Drowsy.   . Hydrocodone-Acetaminophen     REACTION: itching  . Latex   . Oxycodone Itching    MEDICATIONS: Current Outpatient Medications on File Prior to Visit  Medication Sig Dispense Refill  . ACCU-CHEK FASTCLIX LANCETS MISC by Does not apply route.    . B Complex Vitamins (B COMPLEX-B12 PO) Take 1 tablet by mouth daily.     Marland Kitchen buPROPion (WELLBUTRIN SR) 150 MG 12 hr tablet Take 1 tablet (150 mg total) by mouth daily. 30 tablet 0  . ferrous sulfate (SLOW RELEASE IRON) 160 (50 Fe) MG TBCR SR tablet Take 1 tablet by mouth daily.    . fexofenadine (ALLEGRA) 180 MG tablet Take 180 mg by mouth daily.    Marland Kitchen  fluticasone (FLONASE) 50 MCG/ACT nasal spray Place 2 sprays into both nostrils daily.    Marland Kitchen glucose blood (ACCU-CHEK GUIDE) test strip 1 each by Other route 2 (two) times daily. Use as instructed    . liraglutide 18 MG/3ML SOPN Inject 0.3 mLs (1.8 mg total) into the skin every morning. 3 pen 0  . Magnesium Oxide (MAG-OXIDE PO) Take 500 mg by mouth daily.    . metFORMIN (GLUCOPHAGE-XR) 500 MG 24 hr tablet TAKE 2 TABLETS BY MOUTH DAILY WITH BREAKFAST. 180 tablet 0  . metoprolol succinate (TOPROL-XL) 25 MG 24 hr tablet Take 25 mg by mouth daily. As needed    . Multiple Vitamin (MULTIVITAMIN) tablet Take 1 tablet by mouth daily.     . pantoprazole (PROTONIX) 40 MG tablet Take 1 tablet (40 mg total) daily by mouth. 90 tablet 3  . TECHLITE PEN NEEDLES 32G X 6 MM MISC  USE AS DIRECTED TO INJECT VICTOZA 100 each 0  . verapamil (CALAN-SR) 240 MG CR tablet Take 1 tablet (240 mg total) at bedtime by mouth. 90 tablet 1  . vitamin B-12 (CYANOCOBALAMIN) 250 MCG tablet Take 500 mcg by mouth once a week.     . Vitamin D, Ergocalciferol, (DRISDOL) 50000 units CAPS capsule Take 1 capsule (50,000 Units total) by mouth every 7 (seven) days. 4 capsule 0  . metoprolol tartrate (LOPRESSOR) 25 MG tablet Take 0.5 tablets (12.5 mg total) by mouth daily as needed (PALPITATIONS). 30 tablet 6   No current facility-administered medications on file prior to visit.     PAST MEDICAL HISTORY: Past Medical History:  Diagnosis Date  . Adenoma    Piturity  . Anemia   . Anxiety   . Anxiety and depression    h/o suicidality w/ citalopram  . Back pain   . Chest pain   . Depression   . Difficult intubation   . ETD (eustachian tube dysfunction)    s/p ENT, declined ear tuves before   . GERD (gastroesophageal reflux disease)   . History of chicken pox    Titered on 08/16/2005  . Hypogonadism male    Dr Cruzita Lederer, used to see urology  . Hypogonadism male   . Infertility male   . Influenza 02/2016  . Internal hemorrhoids    s/p banding  . Low testosterone   . Migraine    on topamax  . OSA on CPAP   . Palpitations   . 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 Prediabetes    A1C 6.2 years ago  . 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 Shortness of breath   . Sleep apnea   . Swelling   . Vitamin B 12 deficiency    h/o  . Vitamin D deficiency     PAST SURGICAL HISTORY: Past Surgical History:  Procedure Laterality Date  . 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 TONSILLECTOMY AND ADENOIDECTOMY      SOCIAL HISTORY: Social History   Tobacco Use  . Smoking status: Never Smoker  . Smokeless tobacco: Never Used  Substance Use Topics  . Alcohol use: Yes    Alcohol/week: 0.0 oz    Comment:  occ  . Drug use: No    FAMILY HISTORY: Family History  Problem Relation Age of Onset  . Obesity Mother   . Depression Father   . Anxiety disorder Father   . Bipolar disorder Father   .  Drug abuse Father   . Diabetes Paternal Grandmother   . Hypertension Paternal Grandmother   . Diabetes Paternal Grandfather   . Prostate cancer Other        GF  . CAD Other        PGF  . Colon cancer Neg Hx     ROS: Review of Systems  Constitutional: Negative for weight loss.  Gastrointestinal: Negative for nausea and vomiting.  Musculoskeletal:       Negative muscle weakness  Endo/Heme/Allergies:       Negative hypoglycemia  Psychiatric/Behavioral: Positive for depression. Negative for suicidal ideas.    PHYSICAL EXAM: Blood pressure 130/84, pulse 73, temperature 97.6 F (36.4 C), temperature source Oral, height 5\' 9"  (1.753 m), weight (!) 359 lb (162.8 kg), SpO2 97 %. Body mass index is 53.02 kg/m. Physical Exam  Constitutional: He is oriented to person, place, and time. He appears well-developed and well-nourished.  Cardiovascular: Normal rate.  Pulmonary/Chest: Effort normal.  Musculoskeletal: Normal range of motion.  Neurological: He is oriented to person, place, and time.  Skin: Skin is warm and dry.  Psychiatric: He has a normal mood and affect. His behavior is normal.  Vitals reviewed.   RECENT LABS AND TESTS: BMET    Component Value Date/Time   NA 141 11/20/2016 0811   K 4.5 11/20/2016 0811   CL 107 (H) 11/20/2016 0811   CO2 21 11/20/2016 0811   GLUCOSE 100 (H) 11/20/2016 0811   GLUCOSE 153 (H) 05/06/2016 1126   BUN 18 11/20/2016 0811   CREATININE 1.11 11/20/2016 0811   CREATININE 0.73 01/19/2016 1624   CALCIUM 9.1 11/20/2016 0811   GFRNONAA 86 11/20/2016 0811   GFRAA 99 11/20/2016 0811   Lab Results  Component Value Date   HGBA1C 5.5 11/20/2016   HGBA1C 6.2 (H) 07/11/2016   HGBA1C 5.7 01/10/2016   HGBA1C 5.8 07/06/2015   HGBA1C 6.2 01/25/2015   Lab  Results  Component Value Date   INSULIN 38.7 (H) 11/20/2016   INSULIN 36.7 (H) 07/11/2016   CBC    Component Value Date/Time   WBC 7.0 11/20/2016 0811   WBC 8.6 05/06/2016 1126   RBC 4.58 11/20/2016 0811   RBC 4.73 05/06/2016 1126   HGB 12.1 (L) 11/20/2016 0811   HCT 36.9 (L) 11/20/2016 0811   PLT 197.0 05/06/2016 1126   MCV 81 11/20/2016 0811   MCH 26.4 (L) 11/20/2016 0811   MCH 27.4 02/05/2016 1730   MCHC 32.8 11/20/2016 0811   MCHC 33.1 05/06/2016 1126   RDW 14.8 11/20/2016 0811   LYMPHSABS 1.9 11/20/2016 0811   MONOABS 0.5 05/06/2016 1126   EOSABS 0.1 11/20/2016 0811   BASOSABS 0.0 11/20/2016 0811   Iron/TIBC/Ferritin/ %Sat    Component Value Date/Time   IRON 37 (L) 11/20/2016 0811   TIBC 246 (L) 11/20/2016 0811   FERRITIN 156 11/20/2016 0811   IRONPCTSAT 15 11/20/2016 0811   Lipid Panel     Component Value Date/Time   CHOL 122 11/20/2016 0811   TRIG 118 11/20/2016 0811   HDL 33 (L) 11/20/2016 0811   CHOLHDL 3 07/26/2015 1030   VLDL 12.0 07/26/2015 1030   LDLCALC 65 11/20/2016 0811   Hepatic Function Panel     Component Value Date/Time   PROT 6.4 11/20/2016 0811   ALBUMIN 4.4 11/20/2016 0811   AST 10 11/20/2016 0811   ALT 14 11/20/2016 0811   ALKPHOS 77 11/20/2016 0811   BILITOT 0.2 11/20/2016 1191  Component Value Date/Time   TSH 2.950 07/11/2016 1018   TSH 2.275 02/05/2016 1730   TSH 1.27 01/10/2016 0835   TSH 2.71 07/08/2014 0848    ASSESSMENT AND PLAN: Type 2 diabetes mellitus without complication, without long-term current use of insulin ([place]) - Plan: liraglutide 18 MG/3ML SOPN  Vitamin D deficiency - Plan: Vitamin D, Ergocalciferol, (DRISDOL) 50000 units CAPS capsule  Other depression - with emotional eating - Plan: buPROPion (WELLBUTRIN SR) 150 MG 12 hr tablet  At risk for osteoporosis  Class 3 severe obesity with serious comorbidity and body mass index (BMI) greater than or equal to 70 in adult, unspecified obesity type  ([place])  PLAN:  Diabetes II Payten has been given extensive diabetes education by myself today including ideal fasting and post-prandial blood glucose readings, individual ideal Hgb A1c goals  and hypoglycemia prevention. We discussed the importance of good blood sugar control to decrease the likelihood of diabetic complications such as nephropathy, neuropathy, limb loss, blindness, coronary artery disease, and death. We discussed the importance of intensive lifestyle modification including diet, exercise and weight loss as the first line treatment for diabetes. Damein agrees to continue Victoza (patient at 1.8 mg) we will refill Victoza 3 pens and he will follow up at the agreed upon time.  Vitamin D Deficiency Caio was informed that low vitamin D levels contributes to fatigue and are associated with obesity, breast, and colon cancer. He agrees to continue to take prescription Vit D @50 ,000 IU every week #4 with no refills and will follow up for routine testing of vitamin D, at least 2-3 times per year. He was informed of the risk of over-replacement of vitamin D and agrees to not increase his dose unless he discusses this with [place] first. Kendrick agrees to follow up with our clinic in 3 weeks.  At risk for osteopenia and osteoporosis Steffon is at risk for osteopenia and osteoporosis due to his vitamin D deficiency. He was encouraged to take his vitamin D and follow his higher calcium diet and increase strengthening exercise to help strengthen his bones and decrease his risk of osteopenia and osteoporosis.  Depression with Emotional Eating Behaviors We discussed behavior modification techniques today to help Warrick deal with his emotional eating and depression. He has agreed to continue Wellbutrin SR 150 mg qd #30 with no refills and will follow up as directed.  Obesity Aram is currently in the action stage of change. As such, his goal is to continue with weight loss efforts He has agreed to  follow a lower carbohydrate, vegetable and lean protein rich diet plan Luisangel has been instructed to work up to a goal of 150 minutes of combined cardio and strengthening exercise per week for weight loss and overall health benefits. We discussed the following Behavioral Modification Strategies today: increasing lean protein intake and work on meal planning and easy cooking plans  Nicolis has agreed to follow up with our clinic in 3 weeks. He was informed of the importance of frequent follow up visits to maximize his success with intensive lifestyle modifications for his multiple health conditions.  I, Doreene Nest, am acting as transcriptionist for Lacy Duverney, PA-C  I have reviewed the above documentation for accuracy and completeness, and I agree with the above. -Lacy Duverney, PA-C  I have reviewed the above note and agree with the plan. -Dennard Nip, MD   OBESITY BEHAVIORAL INTERVENTION VISIT  Today's visit was # 10 out of 22.  Starting weight: 396 lbs Starting date:  07/11/16 Today's weight : 359 lbs Today's date: 01/08/2017 Total lbs lost to date: 37 (Patients must lose 7 lbs in the first 6 months to continue with counseling)   ASK: We discussed the diagnosis of obesity with Harl Favor today and Prophet agreed to give [place] permission to discuss obesity behavioral modification therapy today.  ASSESS: Calum has the diagnosis of obesity and his BMI today is 52.99 Jarin is in the action stage of change   ADVISE: Alberto was educated on the multiple health risks of obesity as well as the benefit of weight loss to improve his health. He was advised of the need for long term treatment and the importance of lifestyle modifications.  AGREE: Multiple dietary modification options and treatment options were discussed and  Rithik agreed to follow a lower carbohydrate, vegetable and lean protein rich diet plan We discussed the following Behavioral Modification Strategies today:  increasing lean protein intake and work on meal planning and easy cooking plans

## 2017-01-14 MED FILL — METOPROLOL SUCC ER 25 MG TA: 25 | 60 days supply | Qty: 30 | Fill #1

## 2017-01-16 ENCOUNTER — Ambulatory Visit: Payer: 59 | Admitting: Internal Medicine

## 2017-01-22 ENCOUNTER — Other Ambulatory Visit: Payer: Self-pay

## 2017-01-22 NOTE — Patient Outreach (Signed)
[facility]) Care Management  01/22/2017  Julian Washington November 28, 1980 [id number]   Member has requested to be withdrawn from the [facility] program.  Case closed as member has withdrawn from the program. Peter Garter RN, [facility] Care Management Coordinator-Link to [place] Management [phone]

## 2017-01-24 ENCOUNTER — Ambulatory Visit (INDEPENDENT_AMBULATORY_CARE_PROVIDER_SITE_OTHER): Payer: 59 | Admitting: Internal Medicine

## 2017-01-24 ENCOUNTER — Encounter: Payer: Self-pay | Admitting: Internal Medicine

## 2017-01-24 VITALS — BP 138/98 | HR 95 | Ht 70.0 in | Wt 370.4 lb

## 2017-01-24 DIAGNOSIS — Z87898 Personal history of other specified conditions: Secondary | ICD-10-CM | POA: Diagnosis not present

## 2017-01-24 DIAGNOSIS — R7303 Prediabetes: Secondary | ICD-10-CM

## 2017-01-24 DIAGNOSIS — E230 Hypopituitarism: Secondary | ICD-10-CM | POA: Diagnosis not present

## 2017-01-24 NOTE — Progress Notes (Addendum)
Patient ID: Julian Washington, male   DOB: 12-09-1980, 36 y.o.   MRN: [id number]  HPI: Julian Washington is a 36 y.o.-year-old man, returning for f/u for hypogonadotropic hypogonadism, history of pituitary adenoma, s/p TSR, and prediabetes. Last visit 1 year ago.  He and his wife did not to the IVF yet, they will have to wait until next year when insurance covers it better and also when his wife's diabetes becomes better controlled.  History of pituitary adenoma: Reviewed history:  The patient has been diagnosed with hypogonadotropic hypogonadism ~2012, after trying to achieve a pregnancy. He tried testosterone replacement and clomiphene (which did not work), a cousin who is a medical resident suggested to have further investigation to find out the cause for his hypogonadism. His urologist at that time ordered a pituitary MRI and he has been found to have a pituitary microadenoma on MRI in 12/2011. He was referred to neurosurgery - Dr Luther Hearing - and he ended up having TSR in 02/2012. Surgical pathology showed:  The sections show monomorphic nests of neoplastic cells with neuroendocrine nuclear features, consistent with a pituitary adenoma. The tumor is strongly and diffusely immunoreactive for prolactin (100% of cells). There is patchy staining for adrenocorticotropic hormone (less than 5% of cells). The cells are negative for TSH, HGH, FSH, and beta-LH.   His highest prolactin level was 44.7 before the surgery. This has decreased after the surgery.  I reviewed patient's pertinent labs per records from [place] [facility] [place]: 11/2010: TSH 2 02/2011: Testosterone 0.89, free testosterone 2.2 (9.3-26.5) 11/17/2011: TSH 2.35, FSH 2.08, LH 1.44, testosterone 0.08 (2.41-8.27) 12/17/2011: Prolactin 44.7, estradiol 23, cortisol 2.9, hemoglobin A1c 6.2% 02/19/2012: TSH 1.9, total T3 106, free T4 1, hemoglobin A1c 5.8% 03/05/2012: Prolactin 1.4, cortisol 37.2, hemoglobin A1c 5.6%   04/02/2012: Prolactin 3.2, testosterone 79 [phone]), hemoglobin A1c 6.1%  06/19/2012: Total testosterone 119 ([phone]), free testosterone 23.5 (40-224), SHBG 17 (10-50), LH  3.4, FSH 4.7, ACTH 20, cortisol 6.5, TSH 3.072, free T4 0.9 09/23/2012: Total testosterone 115, free testosterone 20.5, SHBG 20 04/02/2013: Total testosterone 76, free testosterone 15.6, SHBG 15  04/26/2013: TSH 2.34, free T4 0.9  Pituitary MRI 12/28/2011: Heterogeneous signal intensity lesion centered in the anterior aspect of the sella measures approximately 1 cm in maximum dimension and demonstrates a fluid hematocrit level with some intrinsic T1 bright/T2 dark signal along its caudal margin consistent with subacute hemorrhage. There is no mass effect on the optic chiasm or evidence of cavernous sinus invasion.  Pituitary MRI 09/2012: Right lateral sellar soft tissue, likely residual pituitary 0.6 x 0.4 x 0.9 cm, homogeneous postcontrast enhancement, abuts medial aspect of the right cavernous ICA. No mass effect.  Pituitary MRI (10/05/2014) - which he had since last visit: No residual tumor.  No HAs, no visual disturbance at this visit.  Hypogonadotropic hypogonadism:  Reviewed history: Diagnosed when he and his wife were trying to get pregnant. They are preparing for IVF at [facility] >> wife's diabetes is not under control and he needs to be between 6 and 7% for the procedure. They are seeing reproductive endocrinology: Dr Robbie Louis, MD.  He started to have low T sxs 2011-2012.  He was previously on Testosterone: axillar gel (did not like it as he was always fearing that he can transfer this to his wife) and inj (200 mg 2x a month). On this >> T level normalized, however he cannot do the injections himself. Clomid did not help (3  mo) >> lost capacity to ejaculate and got hot flushes when stopped.   He has decreased libido.  No difficulty obtaining but has pbs maintaining an erection No trauma to  testes, testicular irradiation or surgery No h/o of mumps orchitis/h/o autoimmune ds. No h/o cryptorchidism He grew and went through puberty like his peers No shrinking of testes. + small testes (<5 ml) No incomplete/delayed sexual development     No breast discomfort/gynecomastia    No loss of body hair (axillary/pubic)/decreased need for shaving No height loss No abnormal sense of smell  No hot flushes No vision problems No worst HA of his life, but had HA No FH of hypogonadism/infertility  No FH of hemochromatosis or pituitary tumors No excessive weight gain or loss.  No chronic diseases No chronic pain. Not on opiates, does not take steroids.  No more than 2 drinks a day of alcohol at a time, and this is rarely No anabolic steroids use No herbal medicines  No AI ds in his family, no FH of MS.  He does not have family history of early cardiac disease. GF with AMI at 36 y/o.   Reviewed the pituitary labs from last year >>  Normal except testosterone/LH/FSH: Component     Latest Ref Rng & Units 01/10/2016  Testosterone     264 - 916 ng/dL 29 (L)  Testosterone Free     8.7 - 25.1 pg/mL 1.6 (L)  Sex Horm Binding Glob, Serum     16.5 - 55.9 nmol/L 22.8  IGF-I, LC/MS     53 - 331 ng/mL 61  Z-Score (Male)     -2.0 - 2.0 SD -1.7  FSH     1.4 - 18.1 mIU/ML 3.7  LH     1.50 - 9.30 mIU/mL 2.76  T4,Free(Direct)     0.60 - 1.60 ng/dL 0.79  Triiodothyronine,Free,Serum     2.3 - 4.2 pg/mL 3.6  Cortisol, Plasma     ug/dL 4.8  C206 ACTH     6 - 50 pg/mL 18  TSH     0.35 - 4.50 uIU/mL 1.27  Prolactin     2.0 - 18.0 ng/mL 4.2   He has been diagnosed with OSA in 2011.  He now wears a CPAP machine. She used to work night shifts, but now works days.  Prediabetes: Last Hba1c: Lab Results  Component Value Date   HGBA1C 5.5 11/20/2016   HGBA1C 6.2 (H) 07/11/2016   HGBA1C 5.7 01/10/2016  Prev. 5.9%, prev. 6.2% and 6.3% (see above)  Lab Results  Component Value Date    CHOL 122 11/20/2016   HDL 33 (L) 11/20/2016   LDLCALC 65 11/20/2016   TRIG 118 11/20/2016   CHOLHDL 3 07/26/2015   Last eye exam: 09/2016: No DR  He is on Metformin ER 1000 mg at night  and also Victoza.  Obesity: He tried Weight Watchers >> reached a plateau -stopped. He is on Victoza. He is seen at the weight loss clinic. Started Welbutrin for appetite 3 mo ago.  He is normally following an 1800-calorie diet but came off during the holidays.  He also has a history of B12 deficiency. Stopped sublingual B12.   He started on iron for anemia.   ROS: Constitutional: + Weight gain/no weight loss, no fatigue, no subjective hyperthermia, no subjective hypothermia Eyes: no blurry vision, no xerophthalmia ENT: no sore throat, no nodules palpated in throat, no dysphagia, no odynophagia, no hoarseness Cardiovascular: no CP/no SOB/no palpitations/no leg swelling Respiratory:  no cough/no SOB/no wheezing Gastrointestinal: no N/no V/no D/no C/no acid reflux Musculoskeletal: no muscle aches/no joint aches Skin: no rashes, no hair loss Neurological: no tremors/no numbness/no tingling/no dizziness  I reviewed pt's medications, allergies, PMH, social hx, family hx, and changes were documented in the history of present illness. Otherwise, unchanged from my initial visit note.  Past Medical History:  Diagnosis Date  . Adenoma    Piturity  . Anemia   . Anxiety   . Anxiety and depression    h/o suicidality w/ citalopram  . Back pain   . Chest pain   . Depression   . Difficult intubation   . ETD (eustachian tube dysfunction)    s/p ENT, declined ear tuves before   . GERD (gastroesophageal reflux disease)   . History of chicken pox    Titered on 08/16/2005  . Hypogonadism male    Dr Cruzita Lederer, used to see urology  . Hypogonadism male   . Infertility male   . Influenza 02/2016  . Internal hemorrhoids    s/p banding  . Low testosterone   . Migraine    on topamax  . OSA on CPAP   .  Palpitations   . 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[place])    h/o increased prolactin, Dr Cruzita Lederer (previously @ [facility])  . Prediabetes    A1C 6.2 years ago  . 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 Shortness of breath   . Sleep apnea   . Swelling   . Vitamin B 12 deficiency    h/o  . Vitamin D deficiency    Past Surgical History:  Procedure Laterality Date  . 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 TONSILLECTOMY AND ADENOIDECTOMY     Social History   Socioeconomic History  . Marital status: Married    Spouse name: Not on file  . Number of children: 0  . Years of education: Not on file  . Highest education level: Not on file  Social Needs  . Financial resource strain: Not on file  . Food insecurity - worry: Not on file  . Food insecurity - inability: Not on file  . Transportation needs - medical: Not on file  . Transportation needs - non-medical: Not on file  Occupational History  . Occupation: Therapist, sports, [employer] short stay    Employer: [employer]  Tobacco Use  . Smoking status: Never Smoker  . Smokeless tobacco: Never Used  Substance and Sexual Activity  . Alcohol use: Yes    Alcohol/week: 0.0 oz    Comment: occ  . Drug use: No  . Sexual activity: Not on file  Other Topics Concern  . Not on file  Social History Narrative   Lives w/ wife   Current Outpatient Medications on File Prior to Visit  Medication Sig Dispense Refill  . ACCU-CHEK FASTCLIX LANCETS MISC by Does not apply route.    . B Complex Vitamins (B COMPLEX-B12 PO) Take 1 tablet by mouth daily.     Marland Kitchen buPROPion (WELLBUTRIN SR) 150 MG 12 hr tablet Take 1 tablet (150 mg total) by mouth daily. 30 tablet 0  . ferrous sulfate (SLOW RELEASE IRON) 160 (50 Fe) MG TBCR SR tablet Take 1 tablet by mouth daily.    . fexofenadine (ALLEGRA) 180 MG tablet Take 180 mg by mouth daily.    . fluticasone (FLONASE) 50 MCG/ACT nasal spray Place 2 sprays into both nostrils daily.    Marland Kitchen glucose blood (ACCU-CHEK  GUIDE)  test strip 1 each by Other route 2 (two) times daily. Use as instructed    . liraglutide 18 MG/3ML SOPN Inject 0.3 mLs (1.8 mg total) into the skin every morning. 3 pen 0  . Magnesium Oxide (MAG-OXIDE PO) Take 500 mg by mouth daily.    . metFORMIN (GLUCOPHAGE-XR) 500 MG 24 hr tablet TAKE 2 TABLETS BY MOUTH DAILY WITH BREAKFAST. 180 tablet 0  . metoprolol succinate (TOPROL-XL) 25 MG 24 hr tablet Take 25 mg by mouth daily. As needed    . Multiple Vitamin (MULTIVITAMIN) tablet Take 1 tablet by mouth daily.     . pantoprazole (PROTONIX) 40 MG tablet Take 1 tablet (40 mg total) daily by mouth. 90 tablet 3  . TECHLITE PEN NEEDLES 32G X 6 MM MISC USE AS DIRECTED TO INJECT VICTOZA 100 each 0  . verapamil (CALAN-SR) 240 MG CR tablet Take 1 tablet (240 mg total) at bedtime by mouth. 90 tablet 1  . vitamin B-12 (CYANOCOBALAMIN) 250 MCG tablet Take 500 mcg by mouth once a week.     . Vitamin D, Ergocalciferol, (DRISDOL) 50000 units CAPS capsule Take 1 capsule (50,000 Units total) by mouth every 7 (seven) days. 4 capsule 0  . metoprolol tartrate (LOPRESSOR) 25 MG tablet Take 0.5 tablets (12.5 mg total) by mouth daily as needed (PALPITATIONS). 30 tablet 6   No current facility-administered medications on file prior to visit.    Allergies  Allergen Reactions  . Levaquin [Levofloxacin In D5w] Swelling  . Celexa [Citalopram] Other (See Comments)    Mental status changes   . Dextrans Other (See Comments)    Makes the pt. Drowsy.   . Hydrocodone-Acetaminophen     REACTION: itching  . Latex   . Oxycodone Itching   Family History  Problem Relation Age of Onset  . Obesity Mother   . Depression Father   . Anxiety disorder Father   . Bipolar disorder Father   . Drug abuse Father   . Diabetes Paternal Grandmother   . Hypertension Paternal Grandmother   . Diabetes Paternal Grandfather   . Prostate cancer Other        GF  . CAD Other        PGF  . Colon cancer Neg Hx     PE: BP (!) 138/98   Pulse  95   Ht 5\' 10"  (1.778 m)   Wt (!) 370 lb 6.4 oz (168 kg)   SpO2 99%   BMI 53.15 kg/m  Body mass index is 53.15 kg/m. Wt Readings from Last 3 Encounters:  01/24/17 (!) 370 lb 6.4 oz (168 kg)  01/08/17 (!) 359 lb (162.8 kg)  12/04/16 (!) 347 lb (157.4 kg)   Constitutional: Obese in NAD Eyes: PERRLA, EOMI, no exophthalmos ENT: moist mucous membranes, no thyromegaly, no cervical lymphadenopathy Cardiovascular: Tachycardia, RR, No MRG Respiratory: CTA B Gastrointestinal: abdomen soft, NT, ND, BS+ Musculoskeletal: no deformities, strength intact in all 4 Skin: moist, warm,+ Rash on bilateral shins - ? necrobiosis lipoidica diabeticorum Neurological: no tremor with outstretched hands, DTR normal in all 4  ASSESSMENT: 1. H/o Pituitary adenoma (prolactinoma) with pituitary apoplexy - status post TSR 02/2012  2. Hypogonadotropic hypogonadism  3. Prediabetes  4. Obesity  PLAN:  1. History of pituitary adenoma - Patient has a history of a 1 cm pituitary adenoma, s/p transsphenoidal resection >> prolactinoma - We reviewed his pituitary labs from 2017 and they were normal except the gonadotropic axis - We will repeat his  pituitary labs fasting, in a.m. - He has pituitary MRIs performed by Dr. Salomon Fick (neurosurgery).  Reviewed the last MRI report from 2016 >> no recurrence.  New MRI will probably be next year per patient's report.  2. Hypogonadotropic hypogonadism  - Patient has a long history of hypogonadotropic hypogonadism, with fairly poor response to treatment based on the available testosterone levels - At last visit, he and his wife were preparing for IVF therefore, I did not suggest to start testosterone, but offered Clomid.  However, he did not benefit from Clomid in the past... He would not want to do this now, but would prefer to wait until they get the IVF and then start testosterone.  3. Prediabetes - Latest HbA1c was great 2 months ago, at 5.5% - Continue metformin ER 1000  mg at night, Victoza - Weight loss will greatly help  4. Obesity  - Unfortunately, he started to gain back weight - He is now seen by Dr. Redgie Grayer at the weight loss center. - on Victoza and also Wellbutrin to limit his appetite   Component     Latest Ref Rng & Units 02/10/2017  Testosterone, Serum (Total)     264-916 ng/dL 46 (L)  % Free Testosterone     % 1.4  Free Testosterone, S     52-280 pg/mL  6.4 (L)  Sex Hormone Binding Globulin     nmol/L 25.9  IGF-I, LC/MS     53 - 331 ng/mL 64  Z-Score (Male)     -2.0 - 2 SD -1.6  FSH     1.4 - 18.1 mIU/ML 3.4  LH     1.50 - 9.30 mIU/mL 2.65  Cortisol, Plasma     ug/dL 5.0  C206 ACTH     6 - 50 pg/mL 28  Prolactin     2.0 - 18.0 ng/mL 3.3  TSH     0.35 - 4.50 uIU/mL 2.71  T4,Free(Direct)     0.60 - 1.60 ng/dL 0.78  Triiodothyronine,Free,Serum     2.3 - 4.2 pg/mL 3.1   Labs are normal with the exception of again a very low testosterone level.  Philemon Kingdom, MD PhD [facility] Endocrinology

## 2017-01-24 NOTE — Patient Instructions (Addendum)
Please come back fasting at 8 am for labs.  Please come back for a follow-up appointment in 1 year.

## 2017-01-25 ENCOUNTER — Telehealth: Payer: 59 | Admitting: Family

## 2017-01-25 DIAGNOSIS — H6690 Otitis media, unspecified, unspecified ear: Secondary | ICD-10-CM | POA: Diagnosis not present

## 2017-01-25 MED ORDER — AMOXICILLIN-POT CLAVULANATE 875-125 MG PO TABS: 1.0000 | ORAL_TABLET | Freq: Two times a day (BID) | ORAL | 0 refills | Status: AC

## 2017-01-25 MED ORDER — PREDNISONE 5 MG PO TABS: 5.0000 mg | ORAL_TABLET | ORAL | 0 refills | Status: DC

## 2017-01-25 NOTE — Progress Notes (Signed)
Thank you for the details you included in the comment boxes. Those details are very helpful in determining the best course of treatment for you and help [place] to provide the best care. Per our telephone discussion, with feelings if discomfort approximately 5-6 days ago and history of such in addition to other comorbidities including resistance to bacterial infection (DM2), we will proceed with the Augmentin as requested. I modified the swimmer's ear template slightly for ear infection based on our discussion.   E Visit for Ear Infection  We are sorry that you are not feeling well. Here is how we plan to help!  Based on what you have shared with me it looks like you have an ear infection. This is is a redness or swelling, irritation, or infection of your outer ear canal.  These symptoms usually occur within a few days of swimming.  Your ear canal is a tube that goes from the opening of the ear to the eardrum.  When water stays in your ear canal, germs can grow.  This is a painful condition that often happens to children and swimmers of all ages.  It is not contagious and oral antibiotics are not required to treat uncomplicated swimmer's ear.  The usual symptoms include: Itching inside the ear, Redness or a sense of swelling in the ear, Pain when the ear is tugged on when pressure is placed on the ear, Pus draining from the infected ear.  Augmentin 875/125mg  by mouth twice daily for 7 days and a sterapred 5mg  dose pack.   In certain cases swimmer's ear may progress to a more serious bacterial infection of the middle or inner ear.  If you have a fever 102 and up and significantly worsening symptoms, this could indicate a more serious infection moving to the middle/inner and needs face to face evaluation in an office by a provider.  Your symptoms should improve over the next 3 days and should resolve in about 7 days.  HOME CARE:   Wash your hands frequently.  Do not place the tip of the bottle on your  ear or touch it with your fingers.  You can take Acetominophen 650 mg every 4-6 hours as needed for pain.  If pain is severe or moderate, you can apply a heating pad (set on low) or hot water bottle (wrapped in a towel) to outer ear for 20 minutes.  This will also increase drainage.  Avoid ear plugs  Do not use Q-tips  After showers, help the water run out by tilting your head to one side.  GET HELP RIGHT AWAY IF:   Fever is over 102.2 degrees.  You develop progressive ear pain or hearing loss.  Ear symptoms persist longer than 3 days after treatment.  MAKE SURE YOU:   Understand these instructions.  Will watch your condition.  Will get help right away if you are not doing well or get worse.  TO PREVENT SWIMMER'S EAR:  Use a bathing cap or custom fitted swim molds to keep your ears dry.  Towel off after swimming to dry your ears.  Tilt your head or pull your earlobes to allow the water to escape your ear canal.  If there is still water in your ears, consider using a hairdryer on the lowest setting.  Thank you for choosing an e-visit. Your e-visit answers were reviewed by a board certified advanced clinical practitioner to complete your personal care plan. Depending upon the condition, your plan could have included both over  the counter or prescription medications. Please review your pharmacy choice. Be sure that the pharmacy you have chosen is open so that you can pick up your prescription now.  If there is a problem you may message your provider in [place] to have the prescription routed to another pharmacy. Your safety is important to [place]. If you have drug allergies check your prescription carefully.  For the next 24 hours, you can use MyChart to ask questions about today's visit, request a non-urgent call back, or ask for a work or school excuse from your e-visit provider. You will get an email in the next two days asking about your experience. I hope that your e-visit  has been valuable and will speed your recovery.

## 2017-01-29 ENCOUNTER — Other Ambulatory Visit: Payer: 59

## 2017-01-29 ENCOUNTER — Ambulatory Visit (INDEPENDENT_AMBULATORY_CARE_PROVIDER_SITE_OTHER): Payer: 59 | Admitting: Physician Assistant

## 2017-01-29 VITALS — BP 124/76 | HR 77 | Temp 98.1°F | Ht 70.0 in | Wt 366.0 lb

## 2017-01-29 DIAGNOSIS — Z9189 Other specified personal risk factors, not elsewhere classified: Secondary | ICD-10-CM

## 2017-01-29 DIAGNOSIS — E559 Vitamin D deficiency, unspecified: Secondary | ICD-10-CM

## 2017-01-29 DIAGNOSIS — E119 Type 2 diabetes mellitus without complications: Secondary | ICD-10-CM | POA: Diagnosis not present

## 2017-01-29 DIAGNOSIS — Z3189 Encounter for other procreative management: Secondary | ICD-10-CM | POA: Diagnosis not present

## 2017-01-29 DIAGNOSIS — Z6841 Body Mass Index (BMI) 40.0 and over, adult: Secondary | ICD-10-CM

## 2017-01-29 MED ORDER — VITAMIN D (ERGOCALCIFEROL) 1.25 MG (50000 UNIT) PO CAPS: 50000.0000 [IU] | ORAL_CAPSULE | ORAL | 0 refills | Status: DC

## 2017-01-29 MED ORDER — LIRAGLUTIDE 18 MG/3ML ~~LOC~~ SOPN: 1.8000 mg | PEN_INJECTOR | Freq: Every morning | SUBCUTANEOUS | 0 refills | Status: DC

## 2017-01-29 NOTE — Progress Notes (Signed)
Office: [phone]  /  Fax: [phone]   HPI:   Chief Complaint: OBESITY Julian Washington is here to discuss his progress with his obesity treatment plan. He is on the lower carbohydrate, vegetable and lean protein rich diet plan and is following his eating plan approximately 5 % of the time. He states he is exercising 0 minutes 0 times per week. Julian Washington is busy with work and has not been as mindful of his eating. He has been eating out more. He would like more convenient meal options.  His weight is (!) 366 lb (166 kg) today and has gained 7 pounds since his last visit. He has lost 30 lbs since starting treatment with [place].  Diabetes II Julian Washington has a diagnosis of diabetes type II. Julian Washington states he is not checking BGs at home and he denies any hypoglycemic episodes. Last A1c was 5.5 on 11/20/16. He has been working on intensive lifestyle modifications including diet, exercise, and weight loss to help control his blood glucose levels.   Vitamin D deficiency Julian Washington has a diagnosis of vitamin D deficiency. He is currently taking prescription Vit D, level at 37.0. He denies nausea, vomiting or muscle weakness.  At risk for osteopenia and osteoporosis Julian Washington is at higher risk of osteopenia and osteoporosis due to vitamin D deficiency.   ALLERGIES: Allergies  Allergen Reactions  . Levaquin [Levofloxacin In D5w] Swelling  . Celexa [Citalopram] Other (See Comments)    Mental status changes   . Dextrans Other (See Comments)    Makes the pt. Drowsy.   . Hydrocodone-Acetaminophen     REACTION: itching  . Latex   . Oxycodone Itching    MEDICATIONS: Current Outpatient Medications on File Prior to Visit  Medication Sig Dispense Refill  . ACCU-CHEK FASTCLIX LANCETS MISC by Does not apply route.    Marland Kitchen amoxicillin-clavulanate (AUGMENTIN) 875-125 MG tablet Take 1 tablet by mouth every 12 (twelve) hours for 7 days. 14 tablet 0  . B Complex Vitamins (B COMPLEX-B12 PO) Take 1 tablet by mouth daily.     Marland Kitchen  buPROPion (WELLBUTRIN SR) 150 MG 12 hr tablet Take 1 tablet (150 mg total) by mouth daily. 30 tablet 0  . ferrous sulfate (SLOW RELEASE IRON) 160 (50 Fe) MG TBCR SR tablet Take 1 tablet by mouth daily.    . fexofenadine (ALLEGRA) 180 MG tablet Take 180 mg by mouth daily.    . fluticasone (FLONASE) 50 MCG/ACT nasal spray Place 2 sprays into both nostrils daily.    Marland Kitchen glucose blood (ACCU-CHEK GUIDE) test strip 1 each by Other route 2 (two) times daily. Use as instructed    . Magnesium Oxide (MAG-OXIDE PO) Take 500 mg by mouth daily.    . metFORMIN (GLUCOPHAGE-XR) 500 MG 24 hr tablet TAKE 2 TABLETS BY MOUTH DAILY WITH BREAKFAST. 180 tablet 0  . metoprolol succinate (TOPROL-XL) 25 MG 24 hr tablet Take 25 mg by mouth daily. As needed    . Multiple Vitamin (MULTIVITAMIN) tablet Take 1 tablet by mouth daily.     . pantoprazole (PROTONIX) 40 MG tablet Take 1 tablet (40 mg total) daily by mouth. 90 tablet 3  . predniSONE (DELTASONE) 5 MG tablet Take 1 tablet (5 mg total) by mouth as directed. sterapred generic taper 21 tablet 0  . TECHLITE PEN NEEDLES 32G X 6 MM MISC USE AS DIRECTED TO INJECT VICTOZA 100 each 0  . verapamil (CALAN-SR) 240 MG CR tablet Take 1 tablet (240 mg total) at bedtime by mouth. Julian Washington  tablet 1  . vitamin B-12 (CYANOCOBALAMIN) 250 MCG tablet Take 500 mcg by mouth once a week.     . metoprolol tartrate (LOPRESSOR) 25 MG tablet Take 0.5 tablets (12.5 mg total) by mouth daily as needed (PALPITATIONS). 30 tablet 6   No current facility-administered medications on file prior to visit.     PAST MEDICAL HISTORY: Past Medical History:  Diagnosis Date  . Adenoma    Piturity  . Anemia   . Anxiety   . Anxiety and depression    h/o suicidality w/ citalopram  . Back pain   . Chest pain   . Depression   . Difficult intubation   . ETD (eustachian tube dysfunction)    s/p ENT, declined ear tuves before   . GERD (gastroesophageal reflux disease)   . History of chicken pox    Titered on  08/16/2005  . Hypogonadism male    Dr Cruzita Lederer, used to see urology  . Hypogonadism male   . Infertility male   . Influenza 02/2016  . Internal hemorrhoids    s/p banding  . Low testosterone   . Migraine    on topamax  . OSA on CPAP   . Palpitations   . 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 Prediabetes    A1C 6.2 years ago  . 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 Shortness of breath   . Sleep apnea   . Swelling   . Vitamin B 12 deficiency    h/o  . Vitamin D deficiency     PAST SURGICAL HISTORY: Past Surgical History:  Procedure Laterality Date  . 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 TONSILLECTOMY AND ADENOIDECTOMY      SOCIAL HISTORY: Social History   Tobacco Use  . Smoking status: Never Smoker  . Smokeless tobacco: Never Used  Substance Use Topics  . Alcohol use: Yes    Alcohol/week: 0.0 oz    Comment: occ  . Drug use: No    FAMILY HISTORY: Family History  Problem Relation Age of Onset  . Obesity Mother   . Depression Father   . Anxiety disorder Father   . Bipolar disorder Father   . Drug abuse Father   . Diabetes Paternal Grandmother   . Hypertension Paternal Grandmother   . Diabetes Paternal Grandfather   . Prostate cancer Other        GF  . CAD Other        PGF  . Colon cancer Neg Hx     ROS: Review of Systems  Constitutional: Negative for weight loss.  Gastrointestinal: Negative for nausea and vomiting.  Musculoskeletal:       Negative muscle weakness  Endo/Heme/Allergies:       Negative hypoglycemia    PHYSICAL EXAM: Blood pressure 124/76, pulse 77, temperature 98.1 F (36.7 C), temperature source Oral, height 5\' 10"  (1.778 m), weight (!) 366 lb (166 kg), SpO2 96 %. Body mass index is 52.52 kg/m. Physical Exam  Constitutional: He is oriented to person, place, and time. He appears well-developed and well-nourished.  Cardiovascular: Normal rate.    Pulmonary/Chest: Effort normal.  Musculoskeletal: Normal range of motion.  Neurological: He is oriented to person, place, and time.  Skin: Skin is warm and dry.  Psychiatric: He has a normal mood and affect. His behavior is normal.  Vitals reviewed.   RECENT LABS AND TESTS: BMET  Component Value Date/Time   NA 141 11/20/2016 0811   K 4.5 11/20/2016 0811   CL 107 (H) 11/20/2016 0811   CO2 21 11/20/2016 0811   GLUCOSE 100 (H) 11/20/2016 0811   GLUCOSE 153 (H) 05/06/2016 1126   BUN 18 11/20/2016 0811   CREATININE 1.11 11/20/2016 0811   CREATININE 0.73 01/19/2016 1624   CALCIUM 9.1 11/20/2016 0811   GFRNONAA 86 11/20/2016 0811   GFRAA 99 11/20/2016 0811   Lab Results  Component Value Date   HGBA1C 5.5 11/20/2016   HGBA1C 6.2 (H) 07/11/2016   HGBA1C 5.7 01/10/2016   HGBA1C 5.8 07/06/2015   HGBA1C 6.2 01/25/2015   Lab Results  Component Value Date   INSULIN 38.7 (H) 11/20/2016   INSULIN 36.7 (H) 07/11/2016   CBC    Component Value Date/Time   WBC 7.0 11/20/2016 0811   WBC 8.6 05/06/2016 1126   RBC 4.58 11/20/2016 0811   RBC 4.73 05/06/2016 1126   HGB 12.1 (L) 11/20/2016 0811   HCT 36.9 (L) 11/20/2016 0811   PLT 197.0 05/06/2016 1126   MCV 81 11/20/2016 0811   MCH 26.4 (L) 11/20/2016 0811   MCH 27.4 02/05/2016 1730   MCHC 32.8 11/20/2016 0811   MCHC 33.1 05/06/2016 1126   RDW 14.8 11/20/2016 0811   LYMPHSABS 1.9 11/20/2016 0811   MONOABS 0.5 05/06/2016 1126   EOSABS 0.1 11/20/2016 0811   BASOSABS 0.0 11/20/2016 0811   Iron/TIBC/Ferritin/ %Sat    Component Value Date/Time   IRON 37 (L) 11/20/2016 0811   TIBC 246 (L) 11/20/2016 0811   FERRITIN 156 11/20/2016 0811   IRONPCTSAT 15 11/20/2016 0811   Lipid Panel     Component Value Date/Time   CHOL 122 11/20/2016 0811   TRIG 118 11/20/2016 0811   HDL 33 (L) 11/20/2016 0811   CHOLHDL 3 07/26/2015 1030   VLDL 12.0 07/26/2015 1030   LDLCALC 65 11/20/2016 0811   Hepatic Function Panel     Component  Value Date/Time   PROT 6.4 11/20/2016 0811   ALBUMIN 4.4 11/20/2016 0811   AST 10 11/20/2016 0811   ALT 14 11/20/2016 0811   ALKPHOS 77 11/20/2016 0811   BILITOT 0.2 11/20/2016 0811      Component Value Date/Time   TSH 2.950 07/11/2016 1018   TSH 2.275 02/05/2016 1730   TSH 1.27 01/10/2016 0835   TSH 2.71 07/08/2014 0848    ASSESSMENT AND PLAN: Type 2 diabetes mellitus without complication, without long-term current use of insulin (HCC) - Plan: liraglutide (VICTOZA) 18 MG/3ML SOPN  Vitamin D deficiency - Plan: Vitamin D, Ergocalciferol, (DRISDOL) 50000 units CAPS capsule  At risk for osteoporosis  Class 3 severe obesity with serious comorbidity and body mass index (BMI) of 50.0 to 59.9 in adult, unspecified obesity type ([place])  PLAN:  Diabetes II Burnis has been given extensive diabetes education by myself today including ideal fasting and post-prandial blood glucose readings, individual ideal Hgb A1c goals and hypoglycemia prevention. We discussed the importance of good blood sugar control to decrease the likelihood of diabetic complications such as nephropathy, neuropathy, limb loss, blindness, coronary artery disease, and death. We discussed the importance of intensive lifestyle modification including diet, exercise and weight loss as the first line treatment for diabetes. Amando agrees to continue Victoza 3 pens (Pt at 1.8 mg) and we will refill for 1 month. Avik agrees to follow up with our clinic in 4 weeks.  Vitamin D Deficiency Romin was informed that low vitamin D levels  contributes to fatigue and are associated with obesity, breast, and colon cancer. Rodrigus agrees to continue taking prescription Vit D @50 ,000 IU every week #4 and we will refill for 1 month. She will follow up for routine testing of vitamin D, at least 2-3 times per year. He was informed of the risk of over-replacement of vitamin D and agrees to not increase his dose unless he discusses this with [place] first.  Javione agrees to follow up with our clinic in 4 weeks.  At risk for osteopenia and osteoporosis Jarryd is at risk for osteopenia and osteoporsis due to his vitamin D deficiency. He was encouraged to take his vitamin D and follow his higher calcium diet and increase strengthening exercise to help strengthen his bones and decrease his risk of osteopenia and osteoporosis.  Obesity Iyad is currently in the action stage of change. As such, his goal is to continue with weight loss efforts He has agreed to change to keep a food journal with 1500 calories and 100+ grams of protein daily Anuar has been instructed to work up to a goal of 150 minutes of combined cardio and strengthening exercise per week for weight loss and overall health benefits. We discussed the following Behavioral Modification Strategies today: increasing lean protein intake and decrease eating out   Sameer has agreed to follow up with our clinic in 4 weeks. He was informed of the importance of frequent follow up visits to maximize his success with intensive lifestyle modifications for his multiple health conditions.  I, Trixie Dredge, am acting as transcriptionist for Lacy Duverney, PA-C  I have reviewed the above documentation for accuracy and completeness, and I agree with the above. -Lacy Duverney, PA-C  I have reviewed the above note and agree with the plan. -Dennard Nip, MD     Today's visit was # 11 out of 22.  Starting weight: 396 lbs Starting date: 07/11/16 Today's weight : 366 lbs  Today's date: 01/29/2017 Total lbs lost to date: 30 (Patients must lose 7 lbs in the first 6 months to continue with counseling)   ASK: We discussed the diagnosis of obesity with Harl Favor today and Andrik agreed to give [place] permission to discuss obesity behavioral modification therapy today.  ASSESS: Kuba has the diagnosis of obesity and his BMI today is 52.52 Adreyan is in the action stage of change   ADVISE: Damione was  educated on the multiple health risks of obesity as well as the benefit of weight loss to improve his health. He was advised of the need for long term treatment and the importance of lifestyle modifications.  AGREE: Multiple dietary modification options and treatment options were discussed and  Cauy agreed to keep a food journal with 1500 calories and 100+ grams of protein daily We discussed the following Behavioral Modification Strategies today: increasing lean protein intake and decrease eating out

## 2017-01-31 ENCOUNTER — Other Ambulatory Visit: Payer: 59

## 2017-02-03 MED FILL — VIT D2 1.25 MG (50,000 UNIT: 1.25 MG | 28 days supply | Qty: 4 | Fill #0

## 2017-02-03 MED FILL — VICTOZA 18 MG/3 ML INJECT P: 18 | 30 days supply | Qty: 9 | Fill #0

## 2017-02-10 ENCOUNTER — Other Ambulatory Visit (INDEPENDENT_AMBULATORY_CARE_PROVIDER_SITE_OTHER): Payer: 59

## 2017-02-10 DIAGNOSIS — Z87898 Personal history of other specified conditions: Secondary | ICD-10-CM | POA: Diagnosis not present

## 2017-02-10 DIAGNOSIS — E230 Hypopituitarism: Secondary | ICD-10-CM | POA: Diagnosis not present

## 2017-02-10 LAB — LUTEINIZING HORMONE: LH: 2.65 m[IU]/mL (ref 1.50–9.30)

## 2017-02-10 LAB — T4, FREE: FREE T4: 0.78 ng/dL (ref 0.60–1.60)

## 2017-02-10 LAB — TSH: TSH: 2.71 u[IU]/mL (ref 0.35–4.50)

## 2017-02-10 LAB — T3, FREE: T3 FREE: 3.1 pg/mL (ref 2.3–4.2)

## 2017-02-10 LAB — FOLLICLE STIMULATING HORMONE: FSH: 3.4 m[IU]/mL (ref 1.4–18.1)

## 2017-02-10 LAB — CORTISOL: CORTISOL PLASMA: 5 ug/dL

## 2017-02-14 LAB — PROLACTIN: PROLACTIN: 3.3 ng/mL (ref 2.0–18.0)

## 2017-02-14 LAB — ACTH: C206 ACTH: 28 pg/mL (ref 6–50)

## 2017-02-14 LAB — INSULIN-LIKE GROWTH FACTOR
IGF-I, LC/MS: 64 ng/mL (ref 53–331)
Z-Score (Male): -1.6 SD (ref ?–2.0)

## 2017-02-17 LAB — TESTOSTERONE, FREE AND TOTAL (INCLUDES SHBG)-(MALES)
% Free Testosterone: 1.4 %
FREE TESTOSTERONE, S: 6.4 pg/mL — AB
SEX HORMONE BINDING GLOBULIN: 25.9 nmol/L
Testosterone, Serum (Total): 46 ng/dL — ABNORMAL LOW

## 2017-02-26 ENCOUNTER — Encounter: Payer: Self-pay | Admitting: Physician Assistant

## 2017-02-26 ENCOUNTER — Encounter (INDEPENDENT_AMBULATORY_CARE_PROVIDER_SITE_OTHER): Payer: Self-pay | Admitting: Physician Assistant

## 2017-02-26 ENCOUNTER — Ambulatory Visit (INDEPENDENT_AMBULATORY_CARE_PROVIDER_SITE_OTHER): Payer: Managed Care, Other (non HMO) | Admitting: Physician Assistant

## 2017-02-26 VITALS — BP 124/79 | HR 68 | Temp 97.6°F | Ht 70.0 in | Wt 364.0 lb

## 2017-02-26 DIAGNOSIS — Z9189 Other specified personal risk factors, not elsewhere classified: Secondary | ICD-10-CM

## 2017-02-26 DIAGNOSIS — F3289 Other specified depressive episodes: Secondary | ICD-10-CM | POA: Diagnosis not present

## 2017-02-26 DIAGNOSIS — E119 Type 2 diabetes mellitus without complications: Secondary | ICD-10-CM

## 2017-02-26 DIAGNOSIS — R002 Palpitations: Secondary | ICD-10-CM

## 2017-02-26 DIAGNOSIS — Z6841 Body Mass Index (BMI) 40.0 and over, adult: Secondary | ICD-10-CM | POA: Diagnosis not present

## 2017-02-26 DIAGNOSIS — E559 Vitamin D deficiency, unspecified: Secondary | ICD-10-CM

## 2017-02-26 MED ORDER — BUPROPION HCL ER (SR) 150 MG PO TB12: 150.0000 mg | ORAL_TABLET | Freq: Every day | ORAL | 0 refills | Status: DC

## 2017-02-26 MED ORDER — METOPROLOL SUCCINATE ER 25 MG PO TB24: ORAL_TABLET | ORAL | 0 refills | Status: DC

## 2017-02-26 MED ORDER — INSULIN PEN NEEDLE 32G X 6 MM MISC
0 refills | Status: DC
Start: 2017-02-26 — End: 2017-03-06

## 2017-02-26 MED ORDER — VITAMIN D (ERGOCALCIFEROL) 1.25 MG (50000 UNIT) PO CAPS: 50000.0000 [IU] | ORAL_CAPSULE | ORAL | 0 refills | Status: DC

## 2017-02-26 NOTE — Progress Notes (Addendum)
Office: [phone]  /  Fax: [phone]   HPI:   Chief Complaint: OBESITY Julian Washington is here to discuss his progress with his obesity treatment plan. He is on the keep a food journal with 1500 calories and 100+ grams of protein daily and is following his eating plan approximately 75 % of the time. He states he is exercising 0 minutes 0 times per week. Julian Washington continues to do well with weight loss. He is back on a structured meal plan and states his hunger is well controlled. His weight is (!) 364 lb (165.1 kg) today and has had a weight loss of 2 pounds over a period of 4 weeks since his last visit. He has lost 32 lbs since starting treatment with [place].  Vitamin D deficiency Julian Washington has a diagnosis of vitamin D deficiency. He is currently taking vit D and denies nausea, vomiting or muscle weakness.   Ref. Range 11/20/2016 08:11  Vitamin D, 25-Hydroxy Latest Ref Range: 30.0 - 100.0 ng/mL 37.0   At risk for osteopenia and osteoporosis Dequavion is at higher risk of osteopenia and osteoporosis due to vitamin D deficiency.   Diabetes II Julian Washington has a diagnosis of diabetes type II. Julian Washington is not checking his blood sugar at home and denies any hypoglycemic episodes. He has been working on intensive lifestyle modifications including diet, exercise, and weight loss to help control his blood glucose levels.  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 Today, he requests refill of his Metoprolol XL. He denies any worsening of the palpitaiton, chest pain, shortness of breath.   Depression with emotional eating behaviors Julian Washington is struggling with emotional eating and using food for comfort to the extent that it is negatively impacting his health. He often snacks when he is not hungry. Julian Washington sometimes feels he is out of control and  then feels guilty that he made poor food choices. He has been working on behavior modification techniques to help reduce his emotional eating and has been somewhat successful. His mood is stable and he shows no sign of suicidal or homicidal ideations.  Depression screen Julian Washington 2/9 07/11/2016 01/17/2016 08/31/2015 07/26/2015 04/06/2015  Decreased Interest 2 0 0 0 0  Down, Depressed, Hopeless 2 0 0 0 0  PHQ - 2 Score 4 0 0 0 0  Altered sleeping 1 - - - -  Tired, decreased energy 3 - - - -  Change in appetite 3 - - - -  Feeling bad or failure about yourself  2 - - - -  Trouble concentrating 2 - - - -  Moving slowly or fidgety/restless 3 - - - -  Suicidal thoughts 1 - - - -  PHQ-9 Score 19 - - - -     ALLERGIES: Allergies  Allergen Reactions  . Levaquin [Levofloxacin In D5w] Swelling  . Celexa [Citalopram] Other (See Comments)    Mental status changes   . Dextrans Other (See Comments)    Makes the pt. Drowsy.   . Hydrocodone-Acetaminophen     REACTION: itching  . Latex   . Oxycodone Itching    MEDICATIONS: Current Outpatient Medications on File Prior to Visit  Medication Sig Dispense Refill  . ACCU-CHEK FASTCLIX LANCETS MISC by Does not apply route.    . B Complex Vitamins (B COMPLEX-B12 PO) Take 1 tablet by mouth daily.     Marland Kitchen  ferrous sulfate (SLOW RELEASE IRON) 160 (50 Fe) MG TBCR SR tablet Take 1 tablet by mouth daily.    . fexofenadine (ALLEGRA) 180 MG tablet Take 180 mg by mouth daily.    . fluticasone (FLONASE) 50 MCG/ACT nasal spray Place 2 sprays into both nostrils daily.    Marland Kitchen glucose blood (ACCU-CHEK GUIDE) test strip 1 each by Other route 2 (two) times daily. Use as instructed    . liraglutide (VICTOZA) 18 MG/3ML SOPN Inject 0.3 mLs (1.8 mg total) into the skin every morning. 3 pen 0  . Magnesium Oxide (MAG-OXIDE PO) Take 500 mg by mouth daily.    . metFORMIN (GLUCOPHAGE-XR) 500 MG 24 hr tablet TAKE 2 TABLETS BY MOUTH DAILY WITH BREAKFAST. 180 tablet 0  . Multiple Vitamin  (MULTIVITAMIN) tablet Take 1 tablet by mouth daily.     . pantoprazole (PROTONIX) 40 MG tablet Take 1 tablet (40 mg total) daily by mouth. 90 tablet 3  . predniSONE (DELTASONE) 5 MG tablet Take 1 tablet (5 mg total) by mouth as directed. sterapred generic taper 21 tablet 0  . verapamil (CALAN-SR) 240 MG CR tablet Take 1 tablet (240 mg total) at bedtime by mouth. 90 tablet 1  . vitamin B-12 (CYANOCOBALAMIN) 250 MCG tablet Take 500 mcg by mouth once a week.      No current Washington-administered medications on file prior to visit.     PAST MEDICAL HISTORY: Past Medical History:  Diagnosis Date  . Adenoma    Piturity  . Anemia   . Anxiety   . Anxiety and depression    h/o suicidality w/ citalopram  . Back pain   . Chest pain   . Depression   . Difficult intubation   . ETD (eustachian tube dysfunction)    s/p ENT, declined ear tuves before   . GERD (gastroesophageal reflux disease)   . History of chicken pox    Titered on 08/16/2005  . Hypogonadism male    Julian Washington, used to see urology  . Hypogonadism male   . Infertility male   . Influenza 02/2016  . Internal hemorrhoids    s/p banding  . Low testosterone   . Migraine    on topamax  . OSA on CPAP   . Palpitations   . 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 Prediabetes    A1C 6.2 years ago  . 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 Shortness of breath   . Sleep apnea   . Swelling   . Vitamin B 12 deficiency    h/o  . Vitamin D deficiency     PAST SURGICAL HISTORY: Past Surgical History:  Procedure Laterality Date  . 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 TONSILLECTOMY AND ADENOIDECTOMY      SOCIAL HISTORY: Social History   Tobacco Use  . Smoking status: Never Smoker  . Smokeless tobacco: Never Used  Substance Use Topics  . Alcohol use: Yes    Alcohol/week: 0.0 oz    Comment: occ  . Drug use: No    FAMILY HISTORY: Family  History  Problem Relation Age of Onset  . Obesity Mother   . Depression Father   . Anxiety disorder Father   . Bipolar disorder Father   . Drug abuse Father   . Diabetes Paternal Grandmother   . Hypertension Paternal Grandmother   . Diabetes Paternal Grandfather   .  Prostate cancer Other        GF  . CAD Other        PGF  . Colon cancer Neg Hx     ROS: Review of Systems  Constitutional: Positive for weight loss.  Cardiovascular: Positive for palpitations.  Gastrointestinal: Negative for nausea and vomiting.  Musculoskeletal:       Negative muscle weakness  Endo/Heme/Allergies:       Negative hypoglycemia  Psychiatric/Behavioral: Positive for depression. Negative for suicidal ideas.    PHYSICAL EXAM: Blood pressure 124/79, pulse 68, temperature 97.6 F (36.4 C), temperature source Oral, height 5\' 10"  (1.778 m), weight (!) 364 lb (165.1 kg), SpO2 97 %. Body mass index is 52.23 kg/m. Physical Exam  Constitutional: He is oriented to person, place, and time. He appears well-developed and well-nourished.  Cardiovascular: Normal rate and regular rhythm.  Pulmonary/Chest: Effort normal.  Musculoskeletal: Normal range of motion.  Neurological: He is oriented to person, place, and time.  Skin: Skin is warm and dry.  Psychiatric: He has a normal mood and affect. His behavior is normal.  Vitals reviewed.   RECENT LABS AND TESTS: BMET    Component Value Date/Time   NA 141 11/20/2016 0811   K 4.5 11/20/2016 0811   CL 107 (H) 11/20/2016 0811   CO2 21 11/20/2016 0811   GLUCOSE 100 (H) 11/20/2016 0811   GLUCOSE 153 (H) 05/06/2016 1126   BUN 18 11/20/2016 0811   CREATININE 1.11 11/20/2016 0811   CREATININE 0.73 01/19/2016 1624   CALCIUM 9.1 11/20/2016 0811   GFRNONAA 86 11/20/2016 0811   GFRAA 99 11/20/2016 0811   Lab Results  Component Value Date   HGBA1C 5.5 11/20/2016   HGBA1C 6.2 (H) 07/11/2016   HGBA1C 5.7 01/10/2016   HGBA1C 5.8 07/06/2015   HGBA1C 6.2  01/25/2015   Lab Results  Component Value Date   INSULIN 38.7 (H) 11/20/2016   INSULIN 36.7 (H) 07/11/2016   CBC    Component Value Date/Time   WBC 7.0 11/20/2016 0811   WBC 8.6 05/06/2016 1126   RBC 4.58 11/20/2016 0811   RBC 4.73 05/06/2016 1126   HGB 12.1 (L) 11/20/2016 0811   HCT 36.9 (L) 11/20/2016 0811   PLT 197.0 05/06/2016 1126   MCV 81 11/20/2016 0811   MCH 26.4 (L) 11/20/2016 0811   MCH 27.4 02/05/2016 1730   MCHC 32.8 11/20/2016 0811   MCHC 33.1 05/06/2016 1126   RDW 14.8 11/20/2016 0811   LYMPHSABS 1.9 11/20/2016 0811   MONOABS 0.5 05/06/2016 1126   EOSABS 0.1 11/20/2016 0811   BASOSABS 0.0 11/20/2016 0811   Iron/TIBC/Ferritin/ %Sat    Component Value Date/Time   IRON 37 (L) 11/20/2016 0811   TIBC 246 (L) 11/20/2016 0811   FERRITIN 156 11/20/2016 0811   IRONPCTSAT 15 11/20/2016 0811   Lipid Panel     Component Value Date/Time   CHOL 122 11/20/2016 0811   TRIG 118 11/20/2016 0811   HDL 33 (L) 11/20/2016 0811   CHOLHDL 3 07/26/2015 1030   VLDL 12.0 07/26/2015 1030   LDLCALC 65 11/20/2016 0811   Hepatic Function Panel     Component Value Date/Time   PROT 6.4 11/20/2016 0811   ALBUMIN 4.4 11/20/2016 0811   AST 10 11/20/2016 0811   ALT 14 11/20/2016 0811   ALKPHOS 77 11/20/2016 0811   BILITOT 0.2 11/20/2016 0811      Component Value Date/Time   TSH 2.71 02/10/2017 0836   TSH 2.950 07/11/2016 1018  TSH 2.275 02/05/2016 1730   TSH 1.27 01/10/2016 0835     Ref. Range 11/20/2016 08:11  Vitamin D, 25-Hydroxy Latest Ref Range: 30.0 - 100.0 ng/mL 37.0    ASSESSMENT AND PLAN: Type 2 diabetes mellitus without complication, without long-term current use of insulin (HCC) - Plan: Insulin Pen Needle (TECHLITE PEN NEEDLES) 32G X 6 MM MISC  Vitamin D deficiency - Plan: Vitamin D, Ergocalciferol, (DRISDOL) 50000 units CAPS capsule  Palpitations - Plan: metoprolol succinate (TOPROL-XL) 25 MG 24 hr tablet  Other depression - with emotional eating -  Plan: buPROPion (WELLBUTRIN SR) 150 MG 12 hr tablet  At risk for osteoporosis  Class 3 severe obesity with serious comorbidity and body mass index (BMI) of 50.0 to 59.9 in adult, unspecified obesity type ([place])  PLAN:  Vitamin D Deficiency Julian Washington was informed that low vitamin D levels contributes to fatigue and are associated with obesity, breast, and colon cancer. He agrees to continue to take prescription Vit D @50 ,000 IU every week #4 with no refills and will follow up for routine testing of vitamin D, at least 2-3 times per year. He was informed of the risk of over-replacement of vitamin D and agrees to not increase his dose unless he discusses this with [place] first.  At risk for osteopenia and osteoporosis Julian Washington is at risk for osteopenia and osteoporosis due to his vitamin D deficiency. He was encouraged to take his vitamin D and follow his higher calcium diet and increase strengthening exercise to help strengthen his bones and decrease his risk of osteopenia and osteoporosis.  Diabetes II non insulin without complications Julian Washington has been given extensive diabetes education by myself today including ideal fasting and post-prandial blood glucose readings, individual ideal Hgb A1c goals and hypoglycemia prevention. We discussed the importance of good blood sugar control to decrease the likelihood of diabetic complications such as nephropathy, neuropathy, limb loss, blindness, coronary artery disease, and death. We discussed the importance of intensive lifestyle modification including diet, exercise and weight loss as the first line treatment for diabetes. Julian Washington agrees to continue his diabetes medications and we will refill pen needles, Julian Washington agreed to follow up at the agreed upon time.  Palpitations Julian Washington agreed to take Metoprolol XL 25 mg 1/2 tablet daily prn #15 with no refills. Any worsening of his palpitations, or if any new symptoms, he is to follow up with Cardiology. He is to follow up  with our clinic as scheduled.   Depression with Emotional Eating Behaviors We discussed behavior modification techniques today to help Julian Washington deal with his emotional eating and depression. He has agreed to continue Wellbutrin SR 150 mg qd #90 with no refills and follow up as directed.  Obesity Julian Washington is currently in the action stage of change. As such, his goal is to continue with weight loss efforts He has agreed to follow the Category 3 plan Julian Washington has been instructed to work up to a goal of 150 minutes of combined cardio and strengthening exercise per week for weight loss and overall health benefits. We discussed the following Behavioral Modification Strategies today: increasing lean protein intake and work on meal planning and easy cooking plans  Julian Washington has agreed to follow up with our clinic in 3 weeks. He was informed of the importance of frequent follow up visits to maximize his success with intensive lifestyle modifications for his multiple health conditions.   OBESITY BEHAVIORAL INTERVENTION VISIT  Today's visit was # 12 out of 22.  Starting weight: 396 lbs  Starting date: 07/11/16 Today's weight : 364 lbs  Today's date: 02/26/2017 Total lbs lost to date: 72 (Patients must lose 7 lbs in the first 6 months to continue with counseling)   ASK: We discussed the diagnosis of obesity with Julian Washington today and Julian Washington agreed to give [place] permission to discuss obesity behavioral modification therapy today.  ASSESS: Julian Washington has the diagnosis of obesity and his BMI today is 52.23 Julian Washington is in the action stage of change   ADVISE: Julian Washington was educated on the multiple health risks of obesity as well as the benefit of weight loss to improve his health. He was advised of the need for long term treatment and the importance of lifestyle modifications.  AGREE: Multiple dietary modification options and treatment options were discussed and  Julian Washington agreed to the above obesity treatment  plan.   Julian Skains, am acting as transcriptionist for [employer], PA-C I, Lacy Duverney [facility], have reviewed this note and agree with its contents.  I have reviewed the above documentation for accuracy and completeness, and I agree with the above. -Dennard Nip, MD

## 2017-02-28 ENCOUNTER — Encounter (INDEPENDENT_AMBULATORY_CARE_PROVIDER_SITE_OTHER): Payer: Self-pay | Admitting: Physician Assistant

## 2017-02-28 ENCOUNTER — Encounter: Payer: Self-pay | Admitting: Internal Medicine

## 2017-02-28 NOTE — Telephone Encounter (Signed)
Pt requested his metfromin change to 1000mg  XR is that change okay or keep how it is?

## 2017-03-03 ENCOUNTER — Other Ambulatory Visit: Payer: Self-pay | Admitting: Internal Medicine

## 2017-03-03 MED ORDER — METFORMIN HCL ER 500 MG PO TB24: ORAL_TABLET | ORAL | 3 refills | Status: DC

## 2017-03-06 ENCOUNTER — Telehealth (INDEPENDENT_AMBULATORY_CARE_PROVIDER_SITE_OTHER): Payer: Self-pay | Admitting: Physician Assistant

## 2017-03-06 ENCOUNTER — Other Ambulatory Visit (INDEPENDENT_AMBULATORY_CARE_PROVIDER_SITE_OTHER): Payer: Self-pay

## 2017-03-06 DIAGNOSIS — E119 Type 2 diabetes mellitus without complications: Secondary | ICD-10-CM

## 2017-03-06 DIAGNOSIS — F3289 Other specified depressive episodes: Secondary | ICD-10-CM

## 2017-03-06 MED ORDER — INSULIN PEN NEEDLE 32G X 6 MM MISC: 0 refills | Status: DC

## 2017-03-06 MED ORDER — BUPROPION HCL ER (SR) 150 MG PO TB12: 150.0000 mg | ORAL_TABLET | Freq: Every day | ORAL | 0 refills | Status: DC

## 2017-03-06 NOTE — Telephone Encounter (Signed)
Prescriptions sent in to the pharmacy requested per Dr Leafy Ro. April, [place]

## 2017-03-06 NOTE — Telephone Encounter (Signed)
[place]ford states that [place] gave him written prescriptions for 90 day supply of Wellbutrin & insulin needles to mail to his mail order pharmacy.  He forgot to do it and just today located his envelope with the written prescriptions.  [place] is requesting a 10 day supply of his Wellbutrin called into [facility] on [address].  Please escribe a 90 day supply of Wellbutrin & insulin needles to [facility]. Thank you

## 2017-03-12 ENCOUNTER — Ambulatory Visit: Payer: 59 | Admitting: Pulmonary Disease

## 2017-03-20 ENCOUNTER — Ambulatory Visit (INDEPENDENT_AMBULATORY_CARE_PROVIDER_SITE_OTHER): Payer: Managed Care, Other (non HMO) | Admitting: Physician Assistant

## 2017-03-25 ENCOUNTER — Other Ambulatory Visit: Payer: Self-pay

## 2017-03-25 MED ORDER — PANTOPRAZOLE SODIUM 40 MG PO TBEC: 40.0000 mg | DELAYED_RELEASE_TABLET | Freq: Every day | ORAL | 3 refills | Status: DC

## 2017-03-26 ENCOUNTER — Encounter (INDEPENDENT_AMBULATORY_CARE_PROVIDER_SITE_OTHER): Payer: Self-pay

## 2017-03-26 ENCOUNTER — Ambulatory Visit (INDEPENDENT_AMBULATORY_CARE_PROVIDER_SITE_OTHER): Payer: Managed Care, Other (non HMO) | Admitting: Physician Assistant

## 2017-03-27 ENCOUNTER — Ambulatory Visit (INDEPENDENT_AMBULATORY_CARE_PROVIDER_SITE_OTHER): Payer: Managed Care, Other (non HMO) | Admitting: Family Medicine

## 2017-03-27 ENCOUNTER — Encounter: Payer: Self-pay | Admitting: Family Medicine

## 2017-03-27 VITALS — BP 118/80 | HR 86 | Temp 99.4°F | Ht 71.0 in | Wt 373.0 lb

## 2017-03-27 DIAGNOSIS — R6889 Other general symptoms and signs: Secondary | ICD-10-CM | POA: Diagnosis not present

## 2017-03-27 MED ORDER — OSELTAMIVIR PHOSPHATE 75 MG PO CAPS: 75.0000 mg | ORAL_CAPSULE | Freq: Two times a day (BID) | ORAL | 0 refills | Status: AC

## 2017-03-27 NOTE — Progress Notes (Signed)
Pre visit review using our clinic review tool, if applicable. No additional management support is needed unless otherwise documented below in the visit note. 

## 2017-03-27 NOTE — Progress Notes (Signed)
Chief Complaint  Patient presents with  . Cough  . Back Pain  . Ear Pain  . Fever    Julian Washington here for URI complaints.  Duration: 1 day  Associated symptoms: sinus congestion, rhinorrhea, ear pain, shortness of breath, myalgia and fever (100 F), cough Denies: sinus pain, itchy watery eyes, ear drainage, sore throat and wheezing Treatment to date: ibuprofen, cough drops, Afrin, Sudafed Sick contacts: Yes- sick contacts at work and nephew  ROS:  Const: + fevers HEENT: As noted in HPI Lungs: No SOB  Past Medical History:  Diagnosis Date  . Adenoma    Piturity  . Anemia   . Anxiety   . Anxiety and depression    h/o suicidality w/ citalopram  . Back pain   . Chest pain   . Depression   . Difficult intubation   . ETD (eustachian tube dysfunction)    s/p ENT, declined ear tuves before   . GERD (gastroesophageal reflux disease)   . History of chicken pox    Titered on 08/16/2005  . Hypogonadism male    Dr Cruzita Lederer, used to see urology  . Hypogonadism male   . Infertility male   . Influenza 02/2016  . Internal hemorrhoids    s/p banding  . Low testosterone   . Migraine    on topamax  . OSA on CPAP   . Palpitations   . 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 Prediabetes    A1C 6.2 years ago  . 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 Shortness of breath   . Sleep apnea   . Swelling   . Vitamin B 12 deficiency    h/o  . Vitamin D deficiency    Family History  Problem Relation Age of Onset  . Obesity Mother   . Depression Father   . Anxiety disorder Father   . Bipolar disorder Father   . Drug abuse Father   . Diabetes Paternal Grandmother   . Hypertension Paternal Grandmother   . Diabetes Paternal Grandfather   . Prostate cancer Other        GF  . CAD Other        PGF  . Colon cancer Neg Hx     BP 118/80 (BP Location: Left Arm, Patient Position: Sitting, Cuff Size: Large)   Pulse 86    Temp 99.4 F (37.4 C) (Oral)   Ht 5\' 11"  (1.803 m)   Wt (!) 373 lb (169.2 kg)   SpO2 96%   BMI 52.02 kg/m  General: Awake, alert, appears stated age HEENT: AT, [place], ears patent b/l and TM's w scarring, no signs of infxn, nares patent w/o discharge, pharynx pink and without exudates, MMM Neck: No masses or asymmetry Heart: RRR Lungs: CTAB, no accessory muscle use Psych: Age appropriate judgment and insight, normal mood and affect  Flu-like symptoms - Plan: oseltamivir (TAMIFLU) 75 MG capsule  Orders as above. Continue to push fluids, practice good hand hygiene, cover mouth when coughing. Letter for work given.  F/u prn. If starting to experience increasing fevers, shaking, or shortness of breath, seek immediate care. Pt voiced understanding and agreement to the plan.  [name], DO 03/27/17 1:02 PM

## 2017-03-27 NOTE — Patient Instructions (Addendum)
Continue to push fluids, practice good hand hygiene, and cover your mouth if you cough.  If you start having increasing fevers, shaking or shortness of breath, seek immediate care.  OK to take Tylenol 1000 mg (2 extra strength tabs) or 975 mg (3 regular strength tabs) every 6 hours as needed.  Ibuprofen 400-600 mg (2-3 over the counter strength tabs) every 6 hours as needed for pain.  Let us know if you need anything.  

## 2017-03-30 ENCOUNTER — Encounter: Payer: Self-pay | Admitting: Family Medicine

## 2017-03-31 ENCOUNTER — Encounter: Payer: Self-pay | Admitting: Internal Medicine

## 2017-03-31 ENCOUNTER — Ambulatory Visit: Payer: Managed Care, Other (non HMO) | Admitting: Family Medicine

## 2017-03-31 ENCOUNTER — Encounter: Payer: Self-pay | Admitting: Family Medicine

## 2017-03-31 ENCOUNTER — Ambulatory Visit: Payer: Self-pay | Admitting: *Deleted

## 2017-03-31 VITALS — BP 128/80 | HR 85 | Temp 98.6°F | Ht 71.0 in | Wt 379.5 lb

## 2017-03-31 DIAGNOSIS — H6983 Other specified disorders of Eustachian tube, bilateral: Secondary | ICD-10-CM

## 2017-03-31 DIAGNOSIS — J111 Influenza due to unidentified influenza virus with other respiratory manifestations: Secondary | ICD-10-CM

## 2017-03-31 DIAGNOSIS — J40 Bronchitis, not specified as acute or chronic: Secondary | ICD-10-CM | POA: Insufficient documentation

## 2017-03-31 MED ORDER — AZITHROMYCIN 250 MG PO TABS: ORAL_TABLET | ORAL | 0 refills | Status: DC

## 2017-03-31 MED ORDER — PREDNISONE 10 MG PO TABS: 10.0000 mg | ORAL_TABLET | Freq: Two times a day (BID) | ORAL | 0 refills | Status: AC

## 2017-03-31 NOTE — Telephone Encounter (Signed)
Noted  

## 2017-03-31 NOTE — Progress Notes (Signed)
Subjective:  Patient ID: Julian Washington, male    DOB: 1980-03-09  Age: 37 y.o. MRN: [id number]  CC: wheezing with coughing (diagnosed with flu on 2/14, cough is getting worse, coughing up thick phlegm, chest is achy from the coughing, ear feels like its congested.)   HPI Julian Washington presents for a 5-day history of URI signs and symptoms with fever chills, nasal congestion postnasal drip ear congestion cough with some wheezing.  Diagnosed with the flu 4 days ago and started on Tamiflu.  He has no history of asthma and does not smoke.  He has had problems with his ears in the past including ear infections.  The right ear is congested.  He is taking Flonase and Astelin but this is well.  Outpatient Medications Prior to Visit  Medication Sig Dispense Refill  . ACCU-CHEK FASTCLIX LANCETS MISC by Does not apply route.    . B Complex Vitamins (B COMPLEX-B12 PO) Take 1 tablet by mouth daily.     Marland Kitchen buPROPion (WELLBUTRIN SR) 150 MG 12 hr tablet Take 1 tablet (150 mg total) by mouth daily. 14 tablet 0  . ferrous sulfate (SLOW RELEASE IRON) 160 (50 Fe) MG TBCR SR tablet Take 1 tablet by mouth daily.    . fexofenadine (ALLEGRA) 180 MG tablet Take 180 mg by mouth daily.    . fluticasone (FLONASE) 50 MCG/ACT nasal spray Place 2 sprays into both nostrils daily.    Marland Kitchen glucose blood (ACCU-CHEK GUIDE) test strip 1 each by Other route 2 (two) times daily. Use as instructed    . Insulin Pen Needle (TECHLITE PEN NEEDLES) 32G X 6 MM MISC USE AS DIRECTED TO INJECT VICTOZA 14 each 0  . liraglutide (VICTOZA) 18 MG/3ML SOPN Inject 0.3 mLs (1.8 mg total) into the skin every morning. 3 pen 0  . Magnesium Oxide (MAG-OXIDE PO) Take 500 mg by mouth daily.    . metFORMIN (GLUCOPHAGE-XR) 500 MG 24 hr tablet TAKE 2 TABLETS BY MOUTH DAILY WITH BREAKFAST. 180 tablet 3  . metoprolol succinate (TOPROL-XL) 25 MG 24 hr tablet Take half tab daily 15 tablet 0  . Multiple Vitamin (MULTIVITAMIN) tablet Take 1 tablet by mouth daily.       Marland Kitchen oseltamivir (TAMIFLU) 75 MG capsule Take 1 capsule (75 mg total) by mouth 2 (two) times daily for 5 days. 10 capsule 0  . pantoprazole (PROTONIX) 40 MG tablet Take 1 tablet (40 mg total) by mouth daily. 90 tablet 3  . verapamil (CALAN-SR) 240 MG CR tablet Take 1 tablet (240 mg total) at bedtime by mouth. 90 tablet 1  . vitamin B-12 (CYANOCOBALAMIN) 250 MCG tablet Take 500 mcg by mouth once a week.     . Vitamin D, Ergocalciferol, (DRISDOL) 50000 units CAPS capsule Take 1 capsule (50,000 Units total) by mouth every 7 (seven) days. 4 capsule 0  . predniSONE (DELTASONE) 5 MG tablet Take 1 tablet (5 mg total) by mouth as directed. sterapred generic taper 21 tablet 0   No facility-administered medications prior to visit.     ROS Review of Systems  Constitutional: Positive for chills, fatigue and fever.  HENT: Positive for congestion, ear pain, hearing loss, postnasal drip, rhinorrhea and sore throat.   Eyes: Negative for photophobia and visual disturbance.  Respiratory: Positive for cough and wheezing. Negative for chest tightness and shortness of breath.   Cardiovascular: Negative.   Gastrointestinal: Negative.   Musculoskeletal: Positive for arthralgias and myalgias.  Skin: Negative for color change and  rash.  Neurological: Positive for headaches. Negative for weakness.  Hematological: Does not bruise/bleed easily.  Psychiatric/Behavioral: Negative.     Objective:  BP 128/80 (BP Location: Left Arm, Patient Position: Sitting, Cuff Size: Normal)   Pulse 85   Temp 98.6 F (37 C) (Oral)   Ht 5\' 11"  (1.803 m)   Wt (!) 379 lb 8 oz (172.1 kg)   SpO2 96%   BMI 52.93 kg/m   BP Readings from Last 3 Encounters:  03/31/17 128/80  03/27/17 118/80  02/26/17 124/79    Wt Readings from Last 3 Encounters:  03/31/17 (!) 379 lb 8 oz (172.1 kg)  03/27/17 (!) 373 lb (169.2 kg)  02/26/17 (!) 364 lb (165.1 kg)    Physical Exam  Constitutional: He is oriented to person, place, and time. He  appears well-developed and well-nourished. No distress.  HENT:  Head: Normocephalic and atraumatic.  Right Ear: External ear normal. Tympanic membrane is scarred and retracted. Tympanic membrane is not injected and not erythematous.  Left Ear: External ear normal. Tympanic membrane is scarred and retracted. Tympanic membrane is not injected and not erythematous.  Mouth/Throat: Oropharynx is clear and moist. No oropharyngeal exudate.  Eyes: Conjunctivae are normal. Pupils are equal, round, and reactive to light. Right eye exhibits no discharge. Left eye exhibits no discharge. No scleral icterus.  Neck: Neck supple. No JVD present. No tracheal deviation present. No thyromegaly present.  Cardiovascular: Normal rate, regular rhythm and normal heart sounds.  Pulmonary/Chest: Effort normal and breath sounds normal. No stridor.  Lymphadenopathy:    He has no cervical adenopathy.  Neurological: He is alert and oriented to person, place, and time.  Skin: Skin is warm and dry. He is not diaphoretic.  Psychiatric: He has a normal mood and affect. His behavior is normal.    Lab Results  Component Value Date   WBC 7.0 11/20/2016   HGB 12.1 (L) 11/20/2016   HCT 36.9 (L) 11/20/2016   PLT 197.0 05/06/2016   GLUCOSE 100 (H) 11/20/2016   CHOL 122 11/20/2016   TRIG 118 11/20/2016   HDL 33 (L) 11/20/2016   LDLCALC 65 11/20/2016   ALT 14 11/20/2016   AST 10 11/20/2016   NA 141 11/20/2016   K 4.5 11/20/2016   CL 107 (H) 11/20/2016   CREATININE 1.11 11/20/2016   BUN 18 11/20/2016   CO2 21 11/20/2016   TSH 2.71 02/10/2017   PSA 0.15 07/08/2014   HGBA1C 5.5 11/20/2016   MICROALBUR 1.3 07/26/2015    No results found.  Assessment & Plan:   Julian Washington was seen today for wheezing with coughing.  Diagnoses and all orders for this visit:  Dysfunction of both eustachian tubes -     predniSONE (DELTASONE) 10 MG tablet; Take 1 tablet (10 mg total) by mouth 2 (two) times daily with a meal for 7  days.  Influenza  Bronchitis -     azithromycin (ZITHROMAX) 250 MG tablet; Take 2 today and then one each day until finished.   I have discontinued Julian Washington's predniSONE. I am also having him start on predniSONE and azithromycin. Additionally, I am having him maintain his B Complex Vitamins (B COMPLEX-B12 PO), multivitamin, Magnesium Oxide (MAG-OXIDE PO), vitamin B-12, fexofenadine, fluticasone, ACCU-CHEK FASTCLIX LANCETS, glucose blood, verapamil, ferrous sulfate, liraglutide, Vitamin D (Ergocalciferol), metoprolol succinate, metFORMIN, buPROPion, Insulin Pen Needle, pantoprazole, and oseltamivir.  Meds ordered this encounter  Medications  . predniSONE (DELTASONE) 10 MG tablet    Sig: Take 1 tablet (10  mg total) by mouth 2 (two) times daily with a meal for 7 days.    Dispense:  14 tablet    Refill:  0  . azithromycin (ZITHROMAX) 250 MG tablet    Sig: Take 2 today and then one each day until finished.    Dispense:  6 tablet    Refill:  0     Follow-up: Return in about 1 week (around 04/07/2017), or if symptoms worsen or fail to improve.  Libby Maw, MD

## 2017-03-31 NOTE — Telephone Encounter (Signed)
Patient is calling to request an appointment- he has started having wheezing and burning in his chest with his cough. He has thick yellow sputum and is just getting over the Flu diagnosed last Thursday. Reason for Disposition . Wheezing is present  Answer Assessment - Initial Assessment Questions 1. ONSET: "When did the cough begin?"      Cough has been present with the flu symptoms- but it has gotten raspy  2. SEVERITY: "How bad is the cough today?"      Coughing several times an hour- it is hurting 3. RESPIRATORY DISTRESS: "Describe your breathing."      Burning and wheezing- not SOB 4. FEVER: "Do you have a fever?" If so, ask: "What is your temperature, how was it measured, and when did it start?"     No fever- feels that he is starting to get warm again 5. SPUTUM: "Describe the color of your sputum" (clear, white, yellow, green)     Dark, thick yellow 6. HEMOPTYSIS: "Are you coughing up any blood?" If so ask: "How much?" (flecks, streaks, tablespoons, etc.)     no 7. CARDIAC HISTORY: "Do you have any history of heart disease?" (e.g., heart attack, congestive heart failure)      no 8. LUNG HISTORY: "Do you have any history of lung disease?"  (e.g., pulmonary embolus, asthma, emphysema)     no 9. PE RISK FACTORS: "Do you have a history of blood clots?" (or: recent major surgery, recent prolonged travel, bedridden )     no 10. OTHER SYMPTOMS: "Do you have any other symptoms?" (e.g., runny nose, wheezing, chest pain)       Wheezing- intermittent, burning pain when coughing 11. PREGNANCY: "Is there any chance you are pregnant?" "When was your last menstrual period?"       n/a 12. TRAVEL: "Have you traveled out of the country in the last month?" (e.g., travel history, exposures)       n/a  Protocols used: [place]

## 2017-04-02 ENCOUNTER — Ambulatory Visit (INDEPENDENT_AMBULATORY_CARE_PROVIDER_SITE_OTHER): Payer: Managed Care, Other (non HMO) | Admitting: Physician Assistant

## 2017-04-02 ENCOUNTER — Encounter (INDEPENDENT_AMBULATORY_CARE_PROVIDER_SITE_OTHER): Payer: Self-pay | Admitting: Physician Assistant

## 2017-04-02 VITALS — BP 141/78 | HR 78 | Temp 97.7°F | Ht 71.0 in | Wt 374.0 lb

## 2017-04-02 DIAGNOSIS — Z9189 Other specified personal risk factors, not elsewhere classified: Secondary | ICD-10-CM

## 2017-04-02 DIAGNOSIS — Z6841 Body Mass Index (BMI) 40.0 and over, adult: Secondary | ICD-10-CM

## 2017-04-02 DIAGNOSIS — R030 Elevated blood-pressure reading, without diagnosis of hypertension: Secondary | ICD-10-CM

## 2017-04-02 DIAGNOSIS — R002 Palpitations: Secondary | ICD-10-CM | POA: Diagnosis not present

## 2017-04-02 MED ORDER — METOPROLOL SUCCINATE ER 25 MG PO TB24: ORAL_TABLET | ORAL | 0 refills | Status: DC

## 2017-04-02 NOTE — Progress Notes (Signed)
Office: [phone]  /  Fax: [phone]   HPI:   Chief Complaint: OBESITY Julian Washington is here to discuss his progress with his obesity treatment plan. He is on the Category 3 plan and is following his eating plan approximately 10 % of the time. He states he is exercising 0 minutes 0 times per week. Julian Washington had to reschedule his last visit. He has been sick with upper respiratory infection, and has not been as mindful of his eating. Julian Washington states he feels better and is motivated to get back on track and continue with weight loss. His weight is (!) 374 lb (169.6 kg) today and has had a weight gain of 10 pounds over a period of 5 weeks since his last visit. He has lost 22 lbs since starting treatment with [place].  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 According to cardiology note, he is to continue metoprolol and follow up with them, only if palpitations worsen. He denies chest pain, dyspnea or worsening palpitations.  Elevated Blood Pressure without hypertension Kweli denies history of hypertension and states he is currently on a decongestant, which are contributing to his elevated blood pressure. He declines any medications this visit. Donaldson is on Toprol 12.5 mg qd for palpitations.  At risk for cardiovascular disease Zayin is at a higher than average risk for cardiovascular disease due to obesity, palpitations and elevated blood pressure. He currently denies any chest pain.  ALLERGIES: Allergies  Allergen Reactions  . Levaquin [Levofloxacin In D5w] Swelling  . Celexa [Citalopram] Other (See Comments)    Mental status changes   . Dextrans Other (See Comments)    Makes the pt. Drowsy.   . Hydrocodone-Acetaminophen     REACTION: itching  . Latex   . Oxycodone Itching    MEDICATIONS: Current Outpatient Medications on File Prior to Visit  Medication Sig Dispense Refill  . ACCU-CHEK FASTCLIX LANCETS MISC by Does not apply route.    Marland Kitchen azithromycin  (ZITHROMAX) 250 MG tablet Take 2 today and then one each day until finished. 6 tablet 0  . B Complex Vitamins (B COMPLEX-B12 PO) Take 1 tablet by mouth daily.     Marland Kitchen buPROPion (WELLBUTRIN SR) 150 MG 12 hr tablet Take 1 tablet (150 mg total) by mouth daily. 14 tablet 0  . ferrous sulfate (SLOW RELEASE IRON) 160 (50 Fe) MG TBCR SR tablet Take 1 tablet by mouth daily.    . fexofenadine (ALLEGRA) 180 MG tablet Take 180 mg by mouth daily.    . fluticasone (FLONASE) 50 MCG/ACT nasal spray Place 2 sprays into both nostrils daily.    Marland Kitchen glucose blood (ACCU-CHEK GUIDE) test strip 1 each by Other route 2 (two) times daily. Use as instructed    . Insulin Pen Needle (TECHLITE PEN NEEDLES) 32G X 6 MM MISC USE AS DIRECTED TO INJECT VICTOZA 14 each 0  . liraglutide (VICTOZA) 18 MG/3ML SOPN Inject 0.3 mLs (1.8 mg total) into the skin every morning. 3 pen 0  . Magnesium Oxide (MAG-OXIDE PO) Take 500 mg by mouth daily.    . metFORMIN (GLUCOPHAGE-XR) 500 MG 24 hr tablet TAKE 2 TABLETS BY MOUTH DAILY WITH BREAKFAST. 180 tablet 3  . metoprolol succinate (TOPROL-XL) 25 MG 24 hr tablet Take half tab daily 15 tablet 0  . Multiple Vitamin (MULTIVITAMIN) tablet Take 1 tablet by mouth daily.     . pantoprazole (PROTONIX) 40 MG tablet Take 1 tablet (40 mg total) by mouth daily. 90 tablet  3  . predniSONE (DELTASONE) 10 MG tablet Take 1 tablet (10 mg total) by mouth 2 (two) times daily with a meal for 7 days. 14 tablet 0  . verapamil (CALAN-SR) 240 MG CR tablet Take 1 tablet (240 mg total) at bedtime by mouth. 90 tablet 1  . vitamin B-12 (CYANOCOBALAMIN) 250 MCG tablet Take 500 mcg by mouth once a week.     . Vitamin D, Ergocalciferol, (DRISDOL) 50000 units CAPS capsule Take 1 capsule (50,000 Units total) by mouth every 7 (seven) days. 4 capsule 0   No current facility-administered medications on file prior to visit.     PAST MEDICAL HISTORY: Past Medical History:  Diagnosis Date  . Adenoma    Piturity  . Anemia   .  Anxiety   . Anxiety and depression    h/o suicidality w/ citalopram  . Back pain   . Chest pain   . Depression   . Difficult intubation   . ETD (eustachian tube dysfunction)    s/p ENT, declined ear tuves before   . GERD (gastroesophageal reflux disease)   . History of chicken pox    Titered on 08/16/2005  . Hypogonadism male    Dr Cruzita Lederer, used to see urology  . Hypogonadism male   . Infertility male   . Influenza 02/2016  . Internal hemorrhoids    s/p banding  . Low testosterone   . Migraine    on topamax  . OSA on CPAP   . Palpitations   . 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 Prediabetes    A1C 6.2 years ago  . 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 Shortness of breath   . Sleep apnea   . Swelling   . Vitamin B 12 deficiency    h/o  . Vitamin D deficiency     PAST SURGICAL HISTORY: Past Surgical History:  Procedure Laterality Date  . 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 TONSILLECTOMY AND ADENOIDECTOMY      SOCIAL HISTORY: Social History   Tobacco Use  . Smoking status: Never Smoker  . Smokeless tobacco: Never Used  Substance Use Topics  . Alcohol use: Yes    Alcohol/week: 0.0 oz    Comment: occ  . Drug use: No    FAMILY HISTORY: Family History  Problem Relation Age of Onset  . Obesity Mother   . Depression Father   . Anxiety disorder Father   . Bipolar disorder Father   . Drug abuse Father   . Diabetes Paternal Grandmother   . Hypertension Paternal Grandmother   . Diabetes Paternal Grandfather   . Prostate cancer Other        GF  . CAD Other        PGF  . Colon cancer Neg Hx     ROS: Review of Systems  Constitutional: Negative for weight loss.  Respiratory: Negative for shortness of breath.   Cardiovascular: Negative for chest pain.  Endo/Heme/Allergies:       Positive for palpitations    PHYSICAL EXAM: Blood pressure (!) 141/78, pulse 78,  temperature 97.7 F (36.5 C), temperature source Oral, height 5\' 11"  (1.803 m), weight (!) 374 lb (169.6 kg), SpO2 97 %. Body mass index is 52.16 kg/m. Physical Exam  Constitutional: He is oriented to person, place, and time. He appears well-developed and well-nourished.  Cardiovascular: Normal rate.  Pulmonary/Chest: Effort  normal.  Musculoskeletal: Normal range of motion.  Neurological: He is oriented to person, place, and time.  Skin: Skin is warm and dry.  Psychiatric: He has a normal mood and affect. His behavior is normal.  Vitals reviewed.   RECENT LABS AND TESTS: BMET    Component Value Date/Time   NA 141 11/20/2016 0811   K 4.5 11/20/2016 0811   CL 107 (H) 11/20/2016 0811   CO2 21 11/20/2016 0811   GLUCOSE 100 (H) 11/20/2016 0811   GLUCOSE 153 (H) 05/06/2016 1126   BUN 18 11/20/2016 0811   CREATININE 1.11 11/20/2016 0811   CREATININE 0.73 01/19/2016 1624   CALCIUM 9.1 11/20/2016 0811   GFRNONAA 86 11/20/2016 0811   GFRAA 99 11/20/2016 0811   Lab Results  Component Value Date   HGBA1C 5.5 11/20/2016   HGBA1C 6.2 (H) 07/11/2016   HGBA1C 5.7 01/10/2016   HGBA1C 5.8 07/06/2015   HGBA1C 6.2 01/25/2015   Lab Results  Component Value Date   INSULIN 38.7 (H) 11/20/2016   INSULIN 36.7 (H) 07/11/2016   CBC    Component Value Date/Time   WBC 7.0 11/20/2016 0811   WBC 8.6 05/06/2016 1126   RBC 4.58 11/20/2016 0811   RBC 4.73 05/06/2016 1126   HGB 12.1 (L) 11/20/2016 0811   HCT 36.9 (L) 11/20/2016 0811   PLT 197.0 05/06/2016 1126   MCV 81 11/20/2016 0811   MCH 26.4 (L) 11/20/2016 0811   MCH 27.4 02/05/2016 1730   MCHC 32.8 11/20/2016 0811   MCHC 33.1 05/06/2016 1126   RDW 14.8 11/20/2016 0811   LYMPHSABS 1.9 11/20/2016 0811   MONOABS 0.5 05/06/2016 1126   EOSABS 0.1 11/20/2016 0811   BASOSABS 0.0 11/20/2016 0811   Iron/TIBC/Ferritin/ %Sat    Component Value Date/Time   IRON 37 (L) 11/20/2016 0811   TIBC 246 (L) 11/20/2016 0811   FERRITIN 156  11/20/2016 0811   IRONPCTSAT 15 11/20/2016 0811   Lipid Panel     Component Value Date/Time   CHOL 122 11/20/2016 0811   TRIG 118 11/20/2016 0811   HDL 33 (L) 11/20/2016 0811   CHOLHDL 3 07/26/2015 1030   VLDL 12.0 07/26/2015 1030   LDLCALC 65 11/20/2016 0811   Hepatic Function Panel     Component Value Date/Time   PROT 6.4 11/20/2016 0811   ALBUMIN 4.4 11/20/2016 0811   AST 10 11/20/2016 0811   ALT 14 11/20/2016 0811   ALKPHOS 77 11/20/2016 0811   BILITOT 0.2 11/20/2016 0811      Component Value Date/Time   TSH 2.71 02/10/2017 0836   TSH 2.950 07/11/2016 1018   TSH 2.275 02/05/2016 1730   TSH 1.27 01/10/2016 0835    ASSESSMENT AND PLAN: Palpitations - Plan: metoprolol succinate (TOPROL-XL) 25 MG 24 hr tablet  Blood pressure elevated without history of HTN  At risk for heart disease  Class 3 severe obesity with serious comorbidity and body mass index (BMI) of 50.0 to 59.9 in adult, unspecified obesity type (HCC)  PLAN:  Palpitations Gregoire agrees to continue metoprolol XL 12.5 mg qd #30 and follow up as directed.  Elevated Blood Pressure without hypertension We discussed sodium restriction, working on healthy weight loss, and a regular exercise program as the means to achieve improved blood pressure control. Ghassan agreed with this plan and agreed to follow up as directed. We will continue to monitor his blood pressure as well as his progress with the above lifestyle modifications.   Cardiovascular risk counseling Waddell was  given extended (15 minutes) coronary artery disease prevention counseling today. He is 37 y.o. male and has risk factors for heart disease including obesity, palpitations and elevated blood pressure. We discussed intensive lifestyle modifications today with an emphasis on specific weight loss instructions and strategies. Pt was also informed of the importance of increasing exercise and decreasing saturated fats to help prevent heart  disease.  Obesity Chidi is currently in the action stage of change. As such, his goal is to continue with weight loss efforts He has agreed to follow the Category 3 plan Jaxson has been instructed to work up to a goal of 150 minutes of combined cardio and strengthening exercise per week for weight loss and overall health benefits. We discussed the following Behavioral Modification Strategies today: increasing lean protein intake, keeping healthy foods in the home and planning for success  Joseguadalupe has agreed to follow up with our clinic in 3 weeks. He was informed of the importance of frequent follow up visits to maximize his success with intensive lifestyle modifications for his multiple health conditions.    OBESITY BEHAVIORAL INTERVENTION VISIT  Today's visit was # 13 out of 22.  Starting weight: 396 lbs Starting date: 07/11/16 Today's weight : 374 lbs Today's date: 04/02/2017 Total lbs lost to date: 22 (Patients must lose 7 lbs in the first 6 months to continue with counseling)   ASK: We discussed the diagnosis of obesity with Harl Favor today and Jerol agreed to give [place] permission to discuss obesity behavioral modification therapy today.  ASSESS: Julian Washington has the diagnosis of obesity and his BMI today is 52.19 Julian Washington is in the action stage of change   ADVISE: Julian Washington was educated on the multiple health risks of obesity as well as the benefit of weight loss to improve his health. He was advised of the need for long term treatment and the importance of lifestyle modifications.  AGREE: Multiple dietary modification options and treatment options were discussed and  Julian Washington agreed to the above obesity treatment plan.   Corey Skains, am acting as transcriptionist for [employer], PA-C I, Lacy Duverney [facility], [place], have reviewed this note and agree with its content.

## 2017-04-09 ENCOUNTER — Ambulatory Visit: Payer: Managed Care, Other (non HMO) | Admitting: Pulmonary Disease

## 2017-04-09 ENCOUNTER — Encounter: Payer: Self-pay | Admitting: Pulmonary Disease

## 2017-04-09 VITALS — BP 124/80 | HR 92 | Ht 71.0 in | Wt 373.4 lb

## 2017-04-09 DIAGNOSIS — J45909 Unspecified asthma, uncomplicated: Secondary | ICD-10-CM

## 2017-04-09 DIAGNOSIS — Z9989 Dependence on other enabling machines and devices: Secondary | ICD-10-CM | POA: Diagnosis not present

## 2017-04-09 DIAGNOSIS — G4733 Obstructive sleep apnea (adult) (pediatric): Secondary | ICD-10-CM | POA: Diagnosis not present

## 2017-04-09 DIAGNOSIS — Z6841 Body Mass Index (BMI) 40.0 and over, adult: Secondary | ICD-10-CM

## 2017-04-09 NOTE — Patient Instructions (Signed)
Can try using mucinex to help with cough and chest congestion Call if you aren't feeling better Follow up in 1 year

## 2017-04-09 NOTE — Progress Notes (Signed)
[facility]  Chief Complaint  Patient presents with  . Follow-up    Pt doing well with cpap machine. Pt has productive cough-clear/yellow mucus, post nasal drip, wheezing for last 7 days. Just finished anitbotics 3 days ago    Vital signs: BP 124/80 (BP Location: Left Arm, Cuff Size: Normal)   Pulse 92   Ht 5\' 11"  (1.803 m)   Wt (!) 373 lb 6.4 oz (169.4 kg)   SpO2 94%   BMI 52.08 kg/m   History of Present Illness: Julian Washington is a 37 y.o. male with obstructive sleep apnea.  He has been doing well with CPAP.  Uses nightly.  Has nasal mask.  No issue with mask fit.  He developed a respiratory infection recently.  This started in his sinuses and then settled into his chest.  He was tx with zpak and prednisone.  He is improving, but still has cough with intermittent wheeze and chest congestion.  He has never used an inhaler before.     Physical Exam:  General - pleasant Eyes - pupils reactive ENT - no sinus tenderness, no oral exudate, no LAN Cardiac - regular, no murmur Chest - no wheeze, rales Abd - soft, non tender Ext - no edema Skin - no rashes Neuro - normal strength Psych - normal mood  Assessment/Plan:  Obstructive sleep apnea. - he is compliant with CPAP and reports benefit - continue CPAP 15 cm H2O  Obesity. - discussed importance of weight loss  Acute asthmatic bronchitis. - slowly improving - will have him try prn mucinex - advised him to call if his symptoms don't improve further, and then at that time we could try him on inhaler therapy   Patient Instructions  Can try using mucinex to help with cough and chest congestion Call if you aren't feeling better Follow up in 1 year    Chesley Mires, MD [place] 04/09/2017, 9:37 AM Pager:  [phone]  Flow Sheet  Sleep tests: HST 12/09/09 ([facility]) >> AHI 27.3, SaO2 low 43% CPAP 03/10/17 to 04/08/17 >> used on 30 of 30  nights with average 7 hrs 37 min.  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  Past Surgical History: He  has a past surgical history that includes Pituitary surgery (03-04-2012) and Tonsillectomy and adenoidectomy.  Family History: His family history includes Anxiety disorder in his father; Bipolar disorder in his father; CAD in his other; Depression in his father; Diabetes in his paternal grandfather and paternal grandmother; Drug abuse in his father; Hypertension in his paternal grandmother; Obesity in his mother; Prostate cancer in his other.  Social History: He  reports that  has never smoked. he has never used smokeless tobacco. He reports that he drinks alcohol. He reports that he does not use drugs.  Medications: Allergies as of 04/09/2017      Reactions   Levaquin [levofloxacin In D5w] Swelling   Celexa [citalopram] Other (See Comments)   Mental status changes    Dextrans  Other (See Comments)   Makes the pt. Drowsy.    Hydrocodone-acetaminophen    REACTION: itching   Latex    Oxycodone Itching      Medication List        Accurate as of 04/09/17  9:37 AM. Always use your most recent med list.          ACCU-CHEK FASTCLIX LANCETS Misc by Does not apply route.   ACCU-CHEK GUIDE test strip Generic drug:  glucose blood 1 each by Other route 2 (two) times daily. Use as instructed   B COMPLEX-B12 PO Take 1 tablet by mouth daily.   buPROPion  150 MG 12 hr tablet Commonly known as:  WELLBUTRIN SR Take 1 tablet (150 mg total) by mouth daily.   fexofenadine 180 MG tablet Commonly known as:  ALLEGRA Take 180 mg by mouth daily.   fluticasone 50 MCG/ACT nasal spray Commonly known as:  FLONASE Place 2 sprays into both nostrils daily.   Insulin Pen Needle 32G X 6 MM Misc Commonly known as:  TECHLITE PEN NEEDLES USE AS DIRECTED TO INJECT VICTOZA   liraglutide 18 MG/3ML Sopn Commonly known as:  VICTOZA Inject 0.3 mLs (1.8 mg total) into the skin every morning.   MAG-OXIDE PO Take 500 mg by mouth daily.   metFORMIN 500 MG 24 hr tablet Commonly known as:  GLUCOPHAGE-XR TAKE 2 TABLETS BY MOUTH DAILY WITH BREAKFAST.   metoprolol succinate 25 MG 24 hr tablet Commonly known as:  TOPROL-XL Take half tab daily   multivitamin tablet Take 1 tablet by mouth daily.   pantoprazole 40 MG tablet Commonly known as:  PROTONIX Take 1 tablet (40 mg total) by mouth daily.   SLOW RELEASE IRON 160 (50 Fe) MG Tbcr SR tablet Generic drug:  ferrous sulfate Take 1 tablet by mouth daily.   verapamil 240 MG CR tablet Commonly known as:  CALAN-SR Take 1 tablet (240 mg total) at bedtime by mouth.   vitamin B-12 250 MCG tablet Commonly known as:  CYANOCOBALAMIN Take 500 mcg by mouth once a week.   Vitamin D (Ergocalciferol) 50000 units Caps capsule Commonly known as:  DRISDOL Take 1 capsule (50,000 Units total) by mouth every 7 (seven) days.

## 2017-04-12 ENCOUNTER — Other Ambulatory Visit: Payer: Self-pay

## 2017-04-12 ENCOUNTER — Encounter (HOSPITAL_COMMUNITY): Payer: Self-pay | Admitting: Family Medicine

## 2017-04-12 ENCOUNTER — Ambulatory Visit (HOSPITAL_COMMUNITY)
Admission: EM | Admit: 2017-04-12 | Discharge: 2017-04-12 | Disposition: A | Discharge status: Home / Self Care | Payer: Managed Care, Other (non HMO) | Attending: Family Medicine | Admitting: Family Medicine

## 2017-04-12 DIAGNOSIS — H6981 Other specified disorders of Eustachian tube, right ear: Secondary | ICD-10-CM

## 2017-04-12 DIAGNOSIS — H9201 Otalgia, right ear: Secondary | ICD-10-CM

## 2017-04-12 MED ORDER — PREDNISONE 10 MG (48) PO TBPK: ORAL_TABLET | ORAL | 0 refills | Status: DC

## 2017-04-12 NOTE — ED Triage Notes (Signed)
Right ear pain, feels like pressure,

## 2017-04-17 NOTE — ED Provider Notes (Signed)
[place]   [id number] 04/12/17 Arrival Time: [place]:  1. Right ear pain   2. Eustachian tube dysfunction, right    Trial of: Meds ordered this encounter  Medications  . predniSONE (STERAPRED UNI-PAK 48 TAB) 10 MG (48) TBPK tablet    Sig: Take as directed.    Dispense:  48 tablet    Refill:  0   OTC symptom care/decongestant as needed. May f/u with PCP or here as needed.  Reviewed expectations re: course of current medical issues. Questions answered. Outlined signs and symptoms indicating need for more acute intervention. Patient verbalized understanding. After Visit Summary given.   SUBJECTIVE: History from: patient.  Julian Washington is a 37 y.o. male who presents with complaint of right otalgia without drainage. Onset gradual, approximately 1-2 weeks ago. Recent cold symptoms: none. Fever: no. Overall normal PO intake without n/v. Sick contacts: no. OTC treatment: decongestant; questions help.  Social History   Tobacco Use  Smoking Status Never Smoker  Smokeless Tobacco Never Used    ROS: As per HPI.   OBJECTIVE:  Vitals:   04/12/17 1855 04/12/17 1911  BP: (!) 178/80 121/78  Pulse: 87   Resp: 18   Temp: 99 F (37.2 C)   TempSrc: Oral   SpO2: 96%      General appearance: alert; appears fatigued Ear Canal: normal TM: bilateral: serous fluid noted with slight bulging of TM; no erythema Neck: supple without LAD Lungs: unlabored respirations, symmetrical air entry; cough: absent; no respiratory distress Skin: warm and dry Psychological: alert and cooperative; normal mood and affect  Allergies  Allergen Reactions  . Levaquin [Levofloxacin In D5w] Swelling  . Celexa [Citalopram] Other (See Comments)    Mental status changes   . Dextrans Other (See Comments)    Makes the pt. Drowsy.   . Hydrocodone-Acetaminophen     REACTION: itching  . Latex   . Oxycodone Itching    Past Medical History:  Diagnosis Date  . Anemia     . Anxiety and depression    h/o suicidality w/ citalopram  . Back pain   . Chest pain   . Difficult intubation   . ETD (eustachian tube dysfunction)    s/p ENT, declined ear tuves before   . GERD (gastroesophageal reflux disease)   . History of chicken pox    Titered on 08/16/2005  . Hypogonadism male    Dr Cruzita Lederer, used to see urology  . Infertility male   . Influenza 02/2016  . Internal hemorrhoids    s/p banding  . Low testosterone   . Migraine    on topamax  . OSA on CPAP   . Palpitations   . 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 Prediabetes    A1C 6.2 years ago  . 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 Vitamin B 12 deficiency    h/o  . Vitamin D deficiency    Family History  Problem Relation Age of Onset  . Obesity Mother   . Depression Father   . Anxiety disorder Father   . Bipolar disorder Father   . Drug abuse Father   . Diabetes Paternal Grandmother   . Hypertension Paternal Grandmother   . Diabetes Paternal Grandfather   . Prostate cancer Other        GF  . CAD Other        PGF  .  Colon cancer Neg Hx    Social History   Socioeconomic History  . Marital status: Married    Spouse name: Not on file  . Number of children: 0  . Years of education: Not on file  . Highest education level: Not on file  Social Needs  . Financial resource strain: Not on file  . Food insecurity - worry: Not on file  . Food insecurity - inability: Not on file  . Transportation needs - medical: Not on file  . Transportation needs - non-medical: Not on file  Occupational History  . Occupation: Therapist, sports, [employer] short stay    Employer: [employer]  Tobacco Use  . Smoking status: Never Smoker  . Smokeless tobacco: Never Used  Substance and Sexual Activity  . Alcohol use: Yes    Alcohol/week: 0.0 oz    Comment: occ  . Drug use: No  . Sexual activity: Not on file  Other Topics Concern  . Not on file  Social  History Narrative   Lives w/ wife            Vanessa Kick, MD 04/17/17 [phone]

## 2017-04-23 ENCOUNTER — Ambulatory Visit (INDEPENDENT_AMBULATORY_CARE_PROVIDER_SITE_OTHER): Payer: Managed Care, Other (non HMO) | Admitting: Physician Assistant

## 2017-04-23 VITALS — BP 118/65 | HR 74 | Temp 97.8°F | Ht 71.0 in | Wt 367.0 lb

## 2017-04-23 DIAGNOSIS — E119 Type 2 diabetes mellitus without complications: Secondary | ICD-10-CM | POA: Diagnosis not present

## 2017-04-23 DIAGNOSIS — Z6841 Body Mass Index (BMI) 40.0 and over, adult: Secondary | ICD-10-CM | POA: Diagnosis not present

## 2017-04-23 DIAGNOSIS — E559 Vitamin D deficiency, unspecified: Secondary | ICD-10-CM

## 2017-04-23 DIAGNOSIS — Z9189 Other specified personal risk factors, not elsewhere classified: Secondary | ICD-10-CM

## 2017-04-23 DIAGNOSIS — R002 Palpitations: Secondary | ICD-10-CM | POA: Diagnosis not present

## 2017-04-23 MED ORDER — GLUCOSE BLOOD VI STRP: 1.0000 | ORAL_STRIP | Freq: Two times a day (BID) | 0 refills | Status: DC

## 2017-04-23 MED ORDER — ACCU-CHEK FASTCLIX LANCETS MISC: 1.0000 | Freq: Two times a day (BID) | 0 refills | Status: DC

## 2017-04-23 MED ORDER — VITAMIN D (ERGOCALCIFEROL) 1.25 MG (50000 UNIT) PO CAPS
50000.0000 [IU] | ORAL_CAPSULE | ORAL | 0 refills | Status: DC
Start: 2017-04-23 — End: 2017-05-28

## 2017-04-23 MED ORDER — METOPROLOL SUCCINATE ER 25 MG PO TB24: ORAL_TABLET | ORAL | 0 refills | Status: DC

## 2017-04-23 NOTE — Progress Notes (Signed)
Office: [phone]  /  Fax: [phone]   HPI:   Chief Complaint: OBESITY Julian Washington is here to discuss his progress with his obesity treatment plan. He is on the Category 3 plan and is following his eating plan approximately 20 % of the time. He states he is exercising 0 minutes 0 times per week. Julian Washington continues to do well with weight loss. He makes smarter food choices and controls his portions. [place] has also been working towards increasing his lean protein intake. His weight is (!) 367 lb (166.5 kg) today and has had a weight loss of 7 pounds over a period of 3 weeks since his last visit. He has lost 29 lbs since starting treatment with [place].  Vitamin D deficiency Julian Washington has a diagnosis of vitamin D deficiency. He is currently taking vit D and denies nausea, vomiting or muscle weakness.   Ref. Range 11/20/2016 08:11  Vitamin D, 25-Hydroxy Latest Ref Range: 30.0 - 100.0 ng/mL 37.0   At risk for osteopenia and osteoporosis Julian Washington is at higher risk of osteopenia and osteoporosis due to vitamin D deficiency.   Diabetes II non insulin without complications Julian Washington has a diagnosis of diabetes type II. Julian Washington states he is not checking his blood sugar at home and denies any hypoglycemic episodes.  He is on Victoza but states he has been off his medications for the past few weeks. He has been working on intensive lifestyle modifications including diet, exercise, and weight loss to help control his blood glucose levels.  Palpitations Julian Washington has a history of chronic palpitations due to PACs. According to cardiology note, he is to continue metoprolol and follow up with them, only if palpitations worsen. Julian Washington denies chest pain, dyspnea or worsening palpitations.  ALLERGIES: Allergies  Allergen Reactions  . Levaquin [Levofloxacin In D5w] Swelling  . Celexa [Citalopram] Other (See Comments)    Mental status changes   . Dextrans Other (See Comments)    Makes the pt. Drowsy.   .  Hydrocodone-Acetaminophen     REACTION: itching  . Latex   . Oxycodone Itching    MEDICATIONS: Current Outpatient Medications on File Prior to Visit  Medication Sig Dispense Refill  . B Complex Vitamins (B COMPLEX-B12 PO) Take 1 tablet by mouth daily.     Marland Kitchen buPROPion (WELLBUTRIN SR) 150 MG 12 hr tablet Take 1 tablet (150 mg total) by mouth daily. 14 tablet 0  . ferrous sulfate (SLOW RELEASE IRON) 160 (50 Fe) MG TBCR SR tablet Take 1 tablet by mouth daily.    . fexofenadine (ALLEGRA) 180 MG tablet Take 180 mg by mouth daily.    . fluticasone (FLONASE) 50 MCG/ACT nasal spray Place 2 sprays into both nostrils daily.    . Insulin Pen Needle (TECHLITE PEN NEEDLES) 32G X 6 MM MISC USE AS DIRECTED TO INJECT VICTOZA 14 each 0  . liraglutide (VICTOZA) 18 MG/3ML SOPN Inject 0.3 mLs (1.8 mg total) into the skin every morning. 3 pen 0  . Magnesium Oxide (MAG-OXIDE PO) Take 500 mg by mouth daily.    . metFORMIN (GLUCOPHAGE-XR) 500 MG 24 hr tablet TAKE 2 TABLETS BY MOUTH DAILY WITH BREAKFAST. 180 tablet 3  . Multiple Vitamin (MULTIVITAMIN) tablet Take 1 tablet by mouth daily.     . pantoprazole (PROTONIX) 40 MG tablet Take 1 tablet (40 mg total) by mouth daily. 90 tablet 3  . predniSONE (STERAPRED UNI-PAK 48 TAB) 10 MG (48) TBPK tablet Take as directed. 48 tablet 0  . verapamil (CALAN-SR)  240 MG CR tablet Take 1 tablet (240 mg total) at bedtime by mouth. 90 tablet 1   No current facility-administered medications on file prior to visit.     PAST MEDICAL HISTORY: Past Medical History:  Diagnosis Date  . Anemia   . Anxiety and depression    h/o suicidality w/ citalopram  . Back pain   . Chest pain   . Difficult intubation   . ETD (eustachian tube dysfunction)    s/p ENT, declined ear tuves before   . GERD (gastroesophageal reflux disease)   . History of chicken pox    Titered on 08/16/2005  . Hypogonadism male    Dr Cruzita Lederer, used to see urology  . Infertility male   . Influenza 02/2016  .  Internal hemorrhoids    s/p banding  . Low testosterone   . Migraine    on topamax  . OSA on CPAP   . Palpitations   . 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 Prediabetes    A1C 6.2 years ago  . 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 Vitamin B 12 deficiency    h/o  . Vitamin D deficiency     PAST SURGICAL HISTORY: Past Surgical History:  Procedure Laterality Date  . 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 TONSILLECTOMY AND ADENOIDECTOMY      SOCIAL HISTORY: Social History   Tobacco Use  . Smoking status: Never Smoker  . Smokeless tobacco: Never Used  Substance Use Topics  . Alcohol use: Yes    Alcohol/week: 0.0 oz    Comment: occ  . Drug use: No    FAMILY HISTORY: Family History  Problem Relation Age of Onset  . Obesity Mother   . Depression Father   . Anxiety disorder Father   . Bipolar disorder Father   . Drug abuse Father   . Diabetes Paternal Grandmother   . Hypertension Paternal Grandmother   . Diabetes Paternal Grandfather   . Prostate cancer Other        GF  . CAD Other        PGF  . Colon cancer Neg Hx     ROS: Review of Systems  Constitutional: Positive for weight loss.  Respiratory: Negative for shortness of breath.   Cardiovascular: Positive for palpitations. Negative for chest pain.  Gastrointestinal: Negative for nausea and vomiting.  Musculoskeletal:       Negative for muscle weakness  Endo/Heme/Allergies:       Negative for hypoglycemia    PHYSICAL EXAM: Blood pressure 118/65, pulse 74, temperature 97.8 F (36.6 C), temperature source Oral, height 5\' 11"  (1.803 m), weight (!) 367 lb (166.5 kg), SpO2 98 %. Body mass index is 51.19 kg/m. Physical Exam  Constitutional: He is oriented to person, place, and time. He appears well-developed and well-nourished.  Cardiovascular: Normal rate.  Pulmonary/Chest: Effort normal.  Musculoskeletal: Normal range  of motion.  Neurological: He is oriented to person, place, and time.  Skin: Skin is warm and dry.  Psychiatric: He has a normal mood and affect. His behavior is normal.  Vitals reviewed.   RECENT LABS AND TESTS: BMET    Component Value Date/Time   NA 141 11/20/2016 0811   K 4.5 11/20/2016 0811   CL 107 (H) 11/20/2016 0811   CO2 21 11/20/2016 0811   GLUCOSE 100 (H) 11/20/2016 0811   GLUCOSE 153 (H) 05/06/2016  1126   BUN 18 11/20/2016 0811   CREATININE 1.11 11/20/2016 0811   CREATININE 0.73 01/19/2016 1624   CALCIUM 9.1 11/20/2016 0811   GFRNONAA 86 11/20/2016 0811   GFRAA 99 11/20/2016 0811   Lab Results  Component Value Date   HGBA1C 5.5 11/20/2016   HGBA1C 6.2 (H) 07/11/2016   HGBA1C 5.7 01/10/2016   HGBA1C 5.8 07/06/2015   HGBA1C 6.2 01/25/2015   Lab Results  Component Value Date   INSULIN 38.7 (H) 11/20/2016   INSULIN 36.7 (H) 07/11/2016   CBC    Component Value Date/Time   WBC 7.0 11/20/2016 0811   WBC 8.6 05/06/2016 1126   RBC 4.58 11/20/2016 0811   RBC 4.73 05/06/2016 1126   HGB 12.1 (L) 11/20/2016 0811   HCT 36.9 (L) 11/20/2016 0811   PLT 197.0 05/06/2016 1126   MCV 81 11/20/2016 0811   MCH 26.4 (L) 11/20/2016 0811   MCH 27.4 02/05/2016 1730   MCHC 32.8 11/20/2016 0811   MCHC 33.1 05/06/2016 1126   RDW 14.8 11/20/2016 0811   LYMPHSABS 1.9 11/20/2016 0811   MONOABS 0.5 05/06/2016 1126   EOSABS 0.1 11/20/2016 0811   BASOSABS 0.0 11/20/2016 0811   Iron/TIBC/Ferritin/ %Sat    Component Value Date/Time   IRON 37 (L) 11/20/2016 0811   TIBC 246 (L) 11/20/2016 0811   FERRITIN 156 11/20/2016 0811   IRONPCTSAT 15 11/20/2016 0811   Lipid Panel     Component Value Date/Time   CHOL 122 11/20/2016 0811   TRIG 118 11/20/2016 0811   HDL 33 (L) 11/20/2016 0811   CHOLHDL 3 07/26/2015 1030   VLDL 12.0 07/26/2015 1030   LDLCALC 65 11/20/2016 0811   Hepatic Function Panel     Component Value Date/Time   PROT 6.4 11/20/2016 0811   ALBUMIN 4.4  11/20/2016 0811   AST 10 11/20/2016 0811   ALT 14 11/20/2016 0811   ALKPHOS 77 11/20/2016 0811   BILITOT 0.2 11/20/2016 0811      Component Value Date/Time   TSH 2.71 02/10/2017 0836   TSH 2.950 07/11/2016 1018   TSH 2.275 02/05/2016 1730   TSH 1.27 01/10/2016 0835    ASSESSMENT AND PLAN: Vitamin D deficiency - Plan: Vitamin D, Ergocalciferol, (DRISDOL) 50000 units CAPS capsule  Type 2 diabetes mellitus without complication, without long-term current use of insulin ([place]) - Plan: glucose blood (ACCU-CHEK GUIDE) test strip, ACCU-CHEK FASTCLIX LANCETS MISC  Palpitations - Plan: metoprolol succinate (TOPROL-XL) 25 MG 24 hr tablet  At risk for osteoporosis  Class 3 severe obesity with serious comorbidity and body mass index (BMI) of 50.0 to 59.9 in adult, unspecified obesity type ([place])  PLAN:  Vitamin D Deficiency Julian Washington was informed that low vitamin D levels contributes to fatigue and are associated with obesity, breast, and colon cancer. He agrees to continue to take prescription Vit D @50 ,000 IU every week #4 with no refills and will follow up for routine testing of vitamin D, at least 2-3 times per year. He was informed of the risk of over-replacement of vitamin D and agrees to not increase his dose unless he discusses this with [place] first. Julian Washington agrees to follow up with our clinic in 2 weeks.  At risk for osteopenia and osteoporosis Julian Washington is at risk for osteopenia and osteoporosis due to his vitamin D deficiency. He was encouraged to take his vitamin D and follow his higher calcium diet and increase strengthening exercise to help strengthen his bones and decrease his risk of osteopenia  and osteoporosis.  Diabetes II non insulin without complications Julian Washington has been given extensive diabetes education by myself today including ideal fasting and post-prandial blood glucose readings, individual ideal Hgb A1c goals  and hypoglycemia prevention. We discussed the importance of good blood  sugar control to decrease the likelihood of diabetic complications such as nephropathy, neuropathy, limb loss, blindness, coronary artery disease, and death. We discussed the importance of intensive lifestyle modification including diet, exercise and weight loss as the first line treatment for diabetes. Julian Washington agrees to continue his diabetes medications and we will refill Accu Chek lancets and Accu Chek strips. Julian Washington agrees to follow up at the agreed upon time.   Palpitations Julian Washington agrees to continue metoprolol 25 mg (take half a pill daily 12.5 mg) #30 with no refills and follow up as directed.  Obesity Julian Washington is currently in the action stage of change. As such, his goal is to continue with weight loss efforts He has agreed to follow the Category 4 plan Julian Washington has been instructed to work up to a goal of 150 minutes of combined cardio and strengthening exercise per week for weight loss and overall health benefits. We discussed the following Behavioral Modification Strategies today: increasing lean protein intake and work on meal planning and easy cooking plans  Julian Washington has agreed to follow up with our clinic in 2 weeks. He was informed of the importance of frequent follow up visits to maximize his success with intensive lifestyle modifications for his multiple health conditions.    OBESITY BEHAVIORAL INTERVENTION VISIT  Today's visit was # 14 out of 22.  Starting weight: 396 lbs Starting date: 07/11/16 Today's weight : 367 lbs  Today's date: 04/23/2017 Total lbs lost to date: 29 (Patients must lose 7 lbs in the first 6 months to continue with counseling)   ASK: We discussed the diagnosis of obesity with Julian Washington today and Julian Washington agreed to give [place] permission to discuss obesity behavioral modification therapy today.  ASSESS: Julian Washington has the diagnosis of obesity and his BMI today is 51.21 Julian Washington is in the action stage of change   ADVISE: Julian Washington was educated on the multiple health  risks of obesity as well as the benefit of weight loss to improve his health. He was advised of the need for long term treatment and the importance of lifestyle modifications.  AGREE: Multiple dietary modification options and treatment options were discussed and  Julian Washington agreed to the above obesity treatment plan.   Corey Skains, am acting as transcriptionist for [employer], PA-C I, Lacy Duverney [facility], have reviewed this note and agree with its content

## 2017-05-08 ENCOUNTER — Encounter (INDEPENDENT_AMBULATORY_CARE_PROVIDER_SITE_OTHER): Payer: Self-pay | Admitting: Physician Assistant

## 2017-05-08 ENCOUNTER — Ambulatory Visit (INDEPENDENT_AMBULATORY_CARE_PROVIDER_SITE_OTHER): Payer: Managed Care, Other (non HMO) | Admitting: Physician Assistant

## 2017-05-08 VITALS — BP 139/81 | HR 85 | Temp 98.4°F | Ht 71.0 in | Wt 371.0 lb

## 2017-05-08 DIAGNOSIS — Z6841 Body Mass Index (BMI) 40.0 and over, adult: Secondary | ICD-10-CM

## 2017-05-08 DIAGNOSIS — E119 Type 2 diabetes mellitus without complications: Secondary | ICD-10-CM

## 2017-05-08 DIAGNOSIS — Z9189 Other specified personal risk factors, not elsewhere classified: Secondary | ICD-10-CM

## 2017-05-08 DIAGNOSIS — I10 Essential (primary) hypertension: Secondary | ICD-10-CM

## 2017-05-08 MED ORDER — HYDROCHLOROTHIAZIDE 12.5 MG PO CAPS: 12.5000 mg | ORAL_CAPSULE | Freq: Every day | ORAL | 0 refills | Status: DC

## 2017-05-08 NOTE — Progress Notes (Signed)
Office: [phone]  /  Fax: [phone]   HPI:   Chief Complaint: OBESITY Julian Washington is here to discuss his progress with his obesity treatment plan. He is on the Category 4 plan and is following his eating plan approximately 20 % of the time. He states he is exercising 0 minutes 0 times per week. Julian Washington has significant swelling of his legs. He states he has been more mindful of his eating but continues to struggle to follow any structured meal plan and he does not want to journal his meals.  His weight is (!) 371 lb (168.3 kg) today and has gained 4 pounds since his last visit. He has lost 25 lbs since starting treatment with [place].  Hypertension Julian Washington is a 37 y.o. male with hypertension. Julian Washington has had high blood pressure readings on multiple occasions according to record review. He is currently on verapamil for migraines and metoprolol for premature atrial contractions at 12.5 mg. He has 2+ bilateral lower leg edema. Julian Washington denies chest pain or shortness of breath. He is working weight loss to help control his blood pressure with the goal of decreasing his risk of heart attack and stroke. Julian Washington's blood pressure is not currently controlled.  Diabetes II Julian Washington has a diagnosis of diabetes type II. Julian Washington states he is not checking his BGs at home. He denies hypoglycemia. He is on metformin 1,000 mg and Victoza (1.8 mg). Patient has a history of PAC currently on metoprolol and follows up with cardiology. In the past, Vicotza worsened his palpitations. Currently, he denies any palpitations. Due to patient's desire to discontinue metoprolol (patient will follow up with his cardiologist) and potential recurrence of palpitations on the Victoza, and limited weight loss results on the Victoza, will discontinue Victoza and will increase Metformin. Pt is strongly advised to keep a BG log and bring in for review to determine effects of change on blood sugars. Last A1c was 5.5 on 11/20/16. He has been  working on intensive lifestyle modifications including diet, exercise, and weight loss to help control his blood glucose levels.  At risk for cardiovascular disease Julian Washington is at a higher than average risk for cardiovascular disease due to obesity, hypertension, and diabetes II. He currently denies any chest pain.  ALLERGIES: Allergies  Allergen Reactions  . Levaquin [Levofloxacin In D5w] Swelling  . Celexa [Citalopram] Other (See Comments)    Mental status changes   . Dextrans Other (See Comments)    Makes the pt. Drowsy.   . Hydrocodone-Acetaminophen     REACTION: itching  . Latex   . Oxycodone Itching    MEDICATIONS: Current Outpatient Medications on File Prior to Visit  Medication Sig Dispense Refill  . ACCU-CHEK FASTCLIX LANCETS MISC 1 Package by Does not apply route 2 (two) times daily. 100 each 0  . B Complex Vitamins (B COMPLEX-B12 PO) Take 1 tablet by mouth daily.     Marland Kitchen buPROPion (WELLBUTRIN SR) 150 MG 12 hr tablet Take 1 tablet (150 mg total) by mouth daily. 14 tablet 0  . clindamycin (CLEOCIN) 300 MG capsule Take 300 mg by mouth 3 (three) times daily.    . ferrous sulfate (SLOW RELEASE IRON) 160 (50 Fe) MG TBCR SR tablet Take 1 tablet by mouth daily.    . fexofenadine (ALLEGRA) 180 MG tablet Take 180 mg by mouth daily.    . fluticasone (FLONASE) 50 MCG/ACT nasal spray Place 2 sprays into both nostrils daily.    Marland Kitchen glucose blood (ACCU-CHEK GUIDE) test  strip 1 each by Other route 2 (two) times daily. Use as instructed 100 each 0  . Insulin Pen Needle (TECHLITE PEN NEEDLES) 32G X 6 MM MISC USE AS DIRECTED TO INJECT VICTOZA 14 each 0  . Magnesium Oxide (MAG-OXIDE PO) Take 500 mg by mouth daily.    . metFORMIN (GLUCOPHAGE-XR) 500 MG 24 hr tablet TAKE 2 TABLETS BY MOUTH DAILY WITH BREAKFAST. (Patient taking differently: Take 500 mg by mouth. TAKE 2 TABLETS BY MOUTH TWICE DAILY) 180 tablet 3  . metoprolol succinate (TOPROL-XL) 25 MG 24 hr tablet Take half tab daily 15 tablet 0  .  Multiple Vitamin (MULTIVITAMIN) tablet Take 1 tablet by mouth daily.     . pantoprazole (PROTONIX) 40 MG tablet Take 1 tablet (40 mg total) by mouth daily. 90 tablet 3  . verapamil (CALAN-SR) 240 MG CR tablet Take 1 tablet (240 mg total) at bedtime by mouth. 90 tablet 1  . Vitamin D, Ergocalciferol, (DRISDOL) 50000 units CAPS capsule Take 1 capsule (50,000 Units total) by mouth every 7 (seven) days. 4 capsule 0   No current facility-administered medications on file prior to visit.     PAST MEDICAL HISTORY: Past Medical History:  Diagnosis Date  . Anemia   . Anxiety and depression    h/o suicidality w/ citalopram  . Back pain   . Chest pain   . Difficult intubation   . ETD (eustachian tube dysfunction)    s/p ENT, declined ear tuves before   . GERD (gastroesophageal reflux disease)   . History of chicken pox    Titered on 08/16/2005  . Hypogonadism male    Dr Cruzita Lederer, used to see urology  . Infertility male   . Influenza 02/2016  . Internal hemorrhoids    s/p banding  . Low testosterone   . Migraine    on topamax  . OSA on CPAP   . Palpitations   . 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 Prediabetes    A1C 6.2 years ago  . 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 Vitamin B 12 deficiency    h/o  . Vitamin D deficiency     PAST SURGICAL HISTORY: Past Surgical History:  Procedure Laterality Date  . 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 TONSILLECTOMY AND ADENOIDECTOMY      SOCIAL HISTORY: Social History   Tobacco Use  . Smoking status: Never Smoker  . Smokeless tobacco: Never Used  Substance Use Topics  . Alcohol use: Yes    Alcohol/week: 0.0 oz    Comment: occ  . Drug use: No    FAMILY HISTORY: Family History  Problem Relation Age of Onset  . Obesity Mother   . Depression Father   . Anxiety disorder Father   . Bipolar disorder Father   . Drug abuse Father   . Diabetes  Paternal Grandmother   . Hypertension Paternal Grandmother   . Diabetes Paternal Grandfather   . Prostate cancer Other        GF  . CAD Other        PGF  . Colon cancer Neg Hx     ROS: Review of Systems  Constitutional: Negative for weight loss.  Respiratory: Negative for shortness of breath.   Cardiovascular: Negative for chest pain.  Endo/Heme/Allergies:       Negative hypoglycemia    PHYSICAL EXAM: Blood pressure 139/81, pulse  85, temperature 98.4 F (36.9 C), temperature source Oral, height 5\' 11"  (1.803 m), weight (!) 371 lb (168.3 kg), SpO2 98 %. Body mass index is 51.74 kg/m. Physical Exam  Constitutional: He is oriented to person, place, and time. He appears well-developed and well-nourished.  Cardiovascular: Normal rate.  Pulmonary/Chest: Effort normal.  Musculoskeletal: Normal range of motion.  2+ edema noted in bilateral lower legs  Neurological: He is oriented to person, place, and time.  Skin: Skin is warm and dry.  Psychiatric: He has a normal mood and affect. His behavior is normal.  Vitals reviewed.   RECENT LABS AND TESTS: BMET    Component Value Date/Time   NA 141 11/20/2016 0811   K 4.5 11/20/2016 0811   CL 107 (H) 11/20/2016 0811   CO2 21 11/20/2016 0811   GLUCOSE 100 (H) 11/20/2016 0811   GLUCOSE 153 (H) 05/06/2016 1126   BUN 18 11/20/2016 0811   CREATININE 1.11 11/20/2016 0811   CREATININE 0.73 01/19/2016 1624   CALCIUM 9.1 11/20/2016 0811   GFRNONAA 86 11/20/2016 0811   GFRAA 99 11/20/2016 0811   Lab Results  Component Value Date   HGBA1C 5.5 11/20/2016   HGBA1C 6.2 (H) 07/11/2016   HGBA1C 5.7 01/10/2016   HGBA1C 5.8 07/06/2015   HGBA1C 6.2 01/25/2015   Lab Results  Component Value Date   INSULIN 38.7 (H) 11/20/2016   INSULIN 36.7 (H) 07/11/2016   CBC    Component Value Date/Time   WBC 7.0 11/20/2016 0811   WBC 8.6 05/06/2016 1126   RBC 4.58 11/20/2016 0811   RBC 4.73 05/06/2016 1126   HGB 12.1 (L) 11/20/2016 0811   HCT  36.9 (L) 11/20/2016 0811   PLT 197.0 05/06/2016 1126   MCV 81 11/20/2016 0811   MCH 26.4 (L) 11/20/2016 0811   MCH 27.4 02/05/2016 1730   MCHC 32.8 11/20/2016 0811   MCHC 33.1 05/06/2016 1126   RDW 14.8 11/20/2016 0811   LYMPHSABS 1.9 11/20/2016 0811   MONOABS 0.5 05/06/2016 1126   EOSABS 0.1 11/20/2016 0811   BASOSABS 0.0 11/20/2016 0811   Iron/TIBC/Ferritin/ %Sat    Component Value Date/Time   IRON 37 (L) 11/20/2016 0811   TIBC 246 (L) 11/20/2016 0811   FERRITIN 156 11/20/2016 0811   IRONPCTSAT 15 11/20/2016 0811   Lipid Panel     Component Value Date/Time   CHOL 122 11/20/2016 0811   TRIG 118 11/20/2016 0811   HDL 33 (L) 11/20/2016 0811   CHOLHDL 3 07/26/2015 1030   VLDL 12.0 07/26/2015 1030   LDLCALC 65 11/20/2016 0811   Hepatic Function Panel     Component Value Date/Time   PROT 6.4 11/20/2016 0811   ALBUMIN 4.4 11/20/2016 0811   AST 10 11/20/2016 0811   ALT 14 11/20/2016 0811   ALKPHOS 77 11/20/2016 0811   BILITOT 0.2 11/20/2016 0811      Component Value Date/Time   TSH 2.71 02/10/2017 0836   TSH 2.950 07/11/2016 1018   TSH 2.275 02/05/2016 1730   TSH 1.27 01/10/2016 0835    ASSESSMENT AND PLAN: Essential hypertension - Plan: hydrochlorothiazide (MICROZIDE) 12.5 MG capsule  Type 2 diabetes mellitus without complication, without long-term current use of insulin (HCC)  At risk for heart disease  Class 3 severe obesity with serious comorbidity and body mass index (BMI) of 50.0 to 59.9 in adult, unspecified obesity type ([place])  PLAN:  Hypertension We discussed sodium restriction, working on healthy weight loss, and a regular exercise program as the  means to achieve improved blood pressure control. Julian Washington agreed with this plan and agreed to follow up as directed. We will continue to monitor his blood pressure as well as his progress with the above lifestyle modifications. Julian Washington agrees to start hydrochlorothiazide 12.5 mg qd #30 with no refills and he  agrees to continue verapamil and metoprolol as prescribed. He will watch for signs of hypotension as he continues his lifestyle modifications. Julian Washington agrees to follow up with our clinic in 2 weeks.  Diabetes II Julian Washington has been given extensive diabetes education by myself today including ideal fasting and post-prandial blood glucose readings, individual ideal Hgb A1c goals and hypoglycemia prevention. We discussed the importance of good blood sugar control to decrease the likelihood of diabetic complications such as nephropathy, neuropathy, limb loss, blindness, coronary artery disease, and death. We discussed the importance of intensive lifestyle modification including diet, exercise and weight loss as the first line treatment for diabetes. Julian Washington agrees to continue stop Victoza and he agrees to increase metformin to 500 mg 2 tablets BID (2,000 mg daily)- he states he has medication in appropriate quantity at home. Julian Washington agrees to follow up with our clinic in 2 weeks.   Cardiovascular risk counselling Julian Washington was given extended (15 minutes) coronary artery disease prevention counseling today. He is 37 y.o. male and has risk factors for heart disease including obesity, hypertension, and diabetes II. We discussed intensive lifestyle modifications today with an emphasis on specific weight loss instructions and strategies. Pt was also informed of the importance of increasing exercise and decreasing saturated fats to help prevent heart disease.  Obesity Julian Washington is currently in the action stage of change. As such, his goal is to continue with weight loss efforts He has agreed to portion control better and make smarter food choices, such as increase vegetables and decrease simple carbohydrates  Julian Washington has been instructed to work up to a goal of 150 minutes of combined cardio and strengthening exercise per week for weight loss and overall health benefits. We discussed the following Behavioral Modification  Strategies today: increasing lean protein intake, decrease junk food, and keeping healthy foods in the home   Julian Washington has agreed to follow up with our clinic in 2 weeks. He was informed of the importance of frequent follow up visits to maximize his success with intensive lifestyle modifications for his multiple health conditions.   OBESITY BEHAVIORAL INTERVENTION VISIT  Today's visit was # 15 out of 22.  Starting weight: 396 lbs Starting date: 07/11/16 Today's weight : 371 lbs  Today's date: 05/08/2017 Total lbs lost to date: 25 (Patients must lose 7 lbs in the first 6 months to continue with counseling)   ASK: We discussed the diagnosis of obesity with Harl Favor today and Julian Washington agreed to give [place] permission to discuss obesity behavioral modification therapy today.  ASSESS: Julian Washington has the diagnosis of obesity and his BMI today is 51.77 Julian Washington is in the action stage of change   ADVISE: Julian Washington was educated on the multiple health risks of obesity as well as the benefit of weight loss to improve his health. He was advised of the need for long term treatment and the importance of lifestyle modifications.  AGREE: Multiple dietary modification options and treatment options were discussed and  Chidera agreed to the above obesity treatment plan.   Wilhemena Durie, am acting as transcriptionist for Lacy Duverney, PA-C I, Lacy Duverney [facility], have reviewed this note and agree with its content

## 2017-05-13 ENCOUNTER — Encounter (INDEPENDENT_AMBULATORY_CARE_PROVIDER_SITE_OTHER): Payer: Self-pay | Admitting: Physician Assistant

## 2017-05-14 ENCOUNTER — Ambulatory Visit: Payer: Managed Care, Other (non HMO) | Admitting: Family

## 2017-05-14 ENCOUNTER — Encounter: Payer: Self-pay | Admitting: Family

## 2017-05-14 VITALS — BP 137/76 | HR 88 | Temp 98.6°F | Resp 16 | Ht 71.0 in | Wt 386.0 lb

## 2017-05-14 DIAGNOSIS — L03116 Cellulitis of left lower limb: Secondary | ICD-10-CM | POA: Diagnosis not present

## 2017-05-14 DIAGNOSIS — L03115 Cellulitis of right lower limb: Secondary | ICD-10-CM

## 2017-05-14 DIAGNOSIS — R600 Localized edema: Secondary | ICD-10-CM

## 2017-05-14 DIAGNOSIS — R252 Cramp and spasm: Secondary | ICD-10-CM | POA: Diagnosis not present

## 2017-05-14 LAB — BASIC METABOLIC PANEL
BUN: 14 mg/dL (ref 6–23)
CALCIUM: 9.2 mg/dL (ref 8.4–10.5)
CO2: 28 mEq/L (ref 19–32)
CREATININE: 0.76 mg/dL (ref 0.40–1.50)
Chloride: 104 mEq/L (ref 96–112)
GFR: 123.26 mL/min (ref >60.00)
Glucose, Bld: 137 mg/dL — ABNORMAL HIGH (ref 70–99)
Potassium: 4.3 mEq/L (ref 3.5–5.1)
Sodium: 138 mEq/L (ref 135–145)

## 2017-05-14 LAB — MAGNESIUM: MAGNESIUM: 1.7 mg/dL (ref 1.5–2.5)

## 2017-05-14 MED ORDER — FUROSEMIDE 20 MG PO TABS: 20.0000 mg | ORAL_TABLET | Freq: Every day | ORAL | 3 refills | Status: DC

## 2017-05-14 MED ORDER — DOXYCYCLINE HYCLATE 100 MG PO TABS: 100.0000 mg | ORAL_TABLET | Freq: Two times a day (BID) | ORAL | 0 refills | Status: DC

## 2017-05-14 NOTE — Progress Notes (Signed)
Subjective:    Patient ID: Julian Washington, male    DOB: 1980/10/31, 37 y.o.   MRN: [id number]  HPI   Julian Washington is a 37 yr old male who presents today with chief complaint of LE edema/redness and pain.  Reports that he just came off of clindamycin for ear infections this past Friday.  Friday took lasix 49m (had on hand from his dog), cramping in the left leg on Friday night Saturday , Sunday no diuretics Monday and Tuesday he took 12.5 mg of hctz which had been prescribed by his Weight loss provider.   Today he notes increased redness, swelling of both legs. Reports sob is at his "baseline" due to his "weight." Denies recent long travel trips.    Review of Systems     Past Medical History:  Diagnosis Date  . Anemia   . Anxiety and depression    h/o suicidality w/ citalopram  . Back pain   . Chest pain   . Difficult intubation   . ETD (eustachian tube dysfunction)    s/p ENT, declined ear tuves before   . GERD (gastroesophageal reflux disease)   . History of chicken pox    Titered on 08/16/2005  . Hypogonadism male    Julian Washington, used to see urology  . Infertility male   . Influenza 02/2016  . Internal hemorrhoids    s/p banding  . Low testosterone   . Migraine    on topamax  . OSA on CPAP   . Palpitations   . 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 Prediabetes    A1C 6.2 years ago  . 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 Vitamin B 12 deficiency    h/o  . Vitamin D deficiency      Social History   Socioeconomic History  . Marital status: Married    Spouse name: Not on file  . Number of children: 0  . Years of education: Not on file  . Highest education level: Not on file  Occupational History  . Occupation: Therapist, sports, [employer] short stay    Employer: [employer]  Social Needs  . Financial resource strain: Not on file  . Food insecurity:    Worry: Not on file    Inability: Not on file  .  Transportation needs:    Medical: Not on file    Non-medical: Not on file  Tobacco Use  . Smoking status: Never Smoker  . Smokeless tobacco: Never Used  Substance and Sexual Activity  . Alcohol use: Yes    Alcohol/week: 0.0 oz    Comment: occ  . Drug use: No  . Sexual activity: Not on file  Lifestyle  . Physical activity:    Days per week: Not on file    Minutes per session: Not on file  . Stress: Not on file  Relationships  . Social connections:    Talks on phone: Not on file    Gets together: Not on file    Attends religious service: Not on file    Active member of club or organization: Not on file    Attends meetings of clubs or organizations: Not on file    Relationship status: Not on file  . Intimate partner violence:    Fear of current or ex partner: Not on file    Emotionally abused: Not on file    Physically abused:  Not on file    Forced sexual activity: Not on file  Other Topics Concern  . Not on file  Social History Narrative   Lives w/ wife    Past Surgical History:  Procedure Laterality Date  . 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 TONSILLECTOMY AND ADENOIDECTOMY      Family History  Problem Relation Age of Onset  . Obesity Mother   . Depression Father   . Anxiety disorder Father   . Bipolar disorder Father   . Drug abuse Father   . Diabetes Paternal Grandmother   . Hypertension Paternal Grandmother   . Diabetes Paternal Grandfather   . Prostate cancer Other        GF  . CAD Other        PGF  . Colon cancer Neg Hx     Allergies  Allergen Reactions  . Levaquin [Levofloxacin In D5w] Swelling  . Celexa [Citalopram] Other (See Comments)    Mental status changes   . Dextrans Other (See Comments)    Makes the pt. Drowsy.   . Hydrocodone-Acetaminophen     REACTION: itching  . Latex   . Oxycodone Itching    Current Outpatient Medications on File Prior to Visit  Medication Sig Dispense Refill  . ACCU-CHEK FASTCLIX LANCETS  MISC 1 Package by Does not apply route 2 (two) times daily. 100 each 0  . B Complex Vitamins (B COMPLEX-B12 PO) Take 1 tablet by mouth daily.     Marland Kitchen buPROPion (WELLBUTRIN SR) 150 MG 12 hr tablet Take 1 tablet (150 mg total) by mouth daily. 14 tablet 0  . ferrous sulfate (SLOW RELEASE IRON) 160 (50 Fe) MG TBCR SR tablet Take 1 tablet by mouth daily.    . fexofenadine (ALLEGRA) 180 MG tablet Take 180 mg by mouth daily.    . fluticasone (FLONASE) 50 MCG/ACT nasal spray Place 2 sprays into both nostrils daily.    Marland Kitchen glucose blood (ACCU-CHEK GUIDE) test strip 1 each by Other route 2 (two) times daily. Use as instructed 100 each 0  . hydrochlorothiazide (MICROZIDE) 12.5 MG capsule Take 1 capsule (12.5 mg total) by mouth daily. 30 capsule 0  . Insulin Pen Needle (TECHLITE PEN NEEDLES) 32G X 6 MM MISC USE AS DIRECTED TO INJECT VICTOZA 14 each 0  . Magnesium Oxide (MAG-OXIDE PO) Take 500 mg by mouth daily.    . metFORMIN (GLUCOPHAGE-XR) 500 MG 24 hr tablet TAKE 2 TABLETS BY MOUTH DAILY WITH BREAKFAST. (Patient taking differently: Take 500 mg by mouth. TAKE 2 TABLETS BY MOUTH TWICE DAILY) 180 tablet 3  . metoprolol succinate (TOPROL-XL) 25 MG 24 hr tablet Take half tab daily 15 tablet 0  . Multiple Vitamin (MULTIVITAMIN) tablet Take 1 tablet by mouth daily.     . pantoprazole (PROTONIX) 40 MG tablet Take 1 tablet (40 mg total) by mouth daily. 90 tablet 3  . verapamil (CALAN-SR) 240 MG CR tablet Take 1 tablet (240 mg total) at bedtime by mouth. 90 tablet 1  . Vitamin D, Ergocalciferol, (DRISDOL) 50000 units CAPS capsule Take 1 capsule (50,000 Units total) by mouth every 7 (seven) days. 4 capsule 0   No current facility-administered medications on file prior to visit.     BP 137/76 (BP Location: Right Arm, Patient Position: Sitting, Cuff Size: Large)   Pulse 88   Temp 98.6 F (37 C) (Oral)   Resp 16   Ht 5\' 11"  (1.803 m)   Wt (!) 386  lb (175.1 kg)   SpO2 96%   BMI 53.84 kg/m    Objective:    Physical Exam  Constitutional: He is oriented to person, place, and time. He appears well-developed and well-nourished. No distress.  HENT:  Head: Normocephalic and atraumatic.  Cardiovascular: Normal rate and regular rhythm.  No murmur heard. Pulmonary/Chest: Effort normal and breath sounds normal. No respiratory distress. He has no wheezes. He has no rales.  Musculoskeletal: He exhibits no edema.  Neurological: He is alert and oriented to person, place, and time.  Skin: Skin is warm and dry.  Psychiatric: He has a normal mood and affect. His behavior is normal. Thought content normal.           Assessment & Plan:  Bilateral LE cellulitis- rx with doxycycline.  LE edema- d/c hctz, continue lasix 20mg  once daily. Check bmet, mag today (pt requesting these to be checked stat due to recent cramping).  I have provided him with an rx for compression stocking 15-45mmHg to wear during the day as well as given him information on where to go for a fitting. He is advised to call if increased pain, redness, swelling or fever.

## 2017-05-14 NOTE — Patient Instructions (Addendum)
Complete lab work prior to leaving. Begin doxycycline for cellulitis. Stop hctz, start furosemide 20mg  once daily as needed for swelling. Once the redness has improved please begin wearing compression stockings during the day. Call if increased pain, redness, swelling or fever.

## 2017-05-16 ENCOUNTER — Ambulatory Visit (HOSPITAL_BASED_OUTPATIENT_CLINIC_OR_DEPARTMENT_OTHER)
Admission: RE | Admit: 2017-05-16 | Discharge: 2017-05-16 | Disposition: A | Discharge status: Home / Self Care | Payer: Managed Care, Other (non HMO) | Source: Ambulatory Visit | Attending: Family | Admitting: Family

## 2017-05-16 ENCOUNTER — Telehealth: Payer: Self-pay | Admitting: Family

## 2017-05-16 DIAGNOSIS — R609 Edema, unspecified: Secondary | ICD-10-CM | POA: Insufficient documentation

## 2017-05-16 NOTE — Telephone Encounter (Signed)
Copied from [place] [id number]. Topic: Inquiry >> May 16, 2017 11:37 AM Margot Ables wrote: Reason for CRM: pt called stating that the swelling in the L leg has not gone down much. Mild improvement. The temperature has improved some. The leg is not as warm to the touch. The stretching and water intake has improved the cramping in his legs at night. Pt is requesting order for [place] to be done today if possible because he is supposed to work tomorrow. Please call pt to notify.

## 2017-05-16 NOTE — Telephone Encounter (Signed)
Contacted patient and advised him re: neg dvt.

## 2017-05-16 NOTE — Telephone Encounter (Signed)
Spoke to pt, advised him that I have scheduled an appointment [facility] at 1[facility].  He reports some mild sob but states that he thinks it is his allergies.  I have advised the patient to proceed to the ER if he develops worsening shortness of breath over the weekend and to come see [place] next week if he feels like he is having an asthma or allergy flare.  Patient verbalizes understanding.

## 2017-05-21 ENCOUNTER — Encounter: Payer: Self-pay | Admitting: Internal Medicine

## 2017-05-21 ENCOUNTER — Ambulatory Visit: Payer: Managed Care, Other (non HMO) | Admitting: Internal Medicine

## 2017-05-21 ENCOUNTER — Ambulatory Visit: Payer: Managed Care, Other (non HMO) | Admitting: Family

## 2017-05-21 VITALS — BP 134/78 | HR 89 | Temp 97.9°F | Resp 14 | Ht 71.0 in | Wt 379.1 lb

## 2017-05-21 DIAGNOSIS — L03116 Cellulitis of left lower limb: Secondary | ICD-10-CM | POA: Diagnosis not present

## 2017-05-21 DIAGNOSIS — E119 Type 2 diabetes mellitus without complications: Secondary | ICD-10-CM | POA: Diagnosis not present

## 2017-05-21 DIAGNOSIS — L03115 Cellulitis of right lower limb: Secondary | ICD-10-CM | POA: Diagnosis not present

## 2017-05-21 DIAGNOSIS — R609 Edema, unspecified: Secondary | ICD-10-CM | POA: Diagnosis not present

## 2017-05-21 LAB — BASIC METABOLIC PANEL
BUN: 20 mg/dL (ref 6–23)
CALCIUM: 9.8 mg/dL (ref 8.4–10.5)
CO2: 26 mEq/L (ref 19–32)
CREATININE: 0.78 mg/dL (ref 0.40–1.50)
Chloride: 105 mEq/L (ref 96–112)
GFR: 119.61 mL/min (ref >60.00)
GLUCOSE: 116 mg/dL — AB (ref 70–99)
Potassium: 4.2 mEq/L (ref 3.5–5.1)
Sodium: 139 mEq/L (ref 135–145)

## 2017-05-21 LAB — MAGNESIUM: Magnesium: 2 mg/dL (ref 1.5–2.5)

## 2017-05-21 MED ORDER — VERAPAMIL HCL ER 240 MG PO TBCR: 240.0000 mg | EXTENDED_RELEASE_TABLET | Freq: Every day | ORAL | 3 refills | Status: DC

## 2017-05-21 MED ORDER — PANTOPRAZOLE SODIUM 40 MG PO TBEC: 40.0000 mg | DELAYED_RELEASE_TABLET | Freq: Every day | ORAL | 3 refills | Status: DC

## 2017-05-21 MED ORDER — FUROSEMIDE 20 MG PO TABS: 20.0000 mg | ORAL_TABLET | Freq: Every day | ORAL | 1 refills | Status: DC

## 2017-05-21 NOTE — Patient Instructions (Signed)
GO TO THE LAB : Get the blood work     GO TO THE FRONT DESK Schedule your next appointment for a physical exam in few months  Manage your lower extremity swelling by: Low-salt diet Lasix daily Leg elevation Once you are completely better start using compression stockings Keep the skin moist with Eucerin OTC   Call if problems

## 2017-05-21 NOTE — Progress Notes (Signed)
Subjective:    Patient ID: Julian [place], male    DOB: March 17, 1980, 37 y.o.   MRN: [id number]  DOS:  05/21/2017 Type of visit - description : Follow-up Interval history: Cellulitis: See last visit few days ago with Ms. Conley Canal.  On doxycycline, doing better. Lower extremity edema: Since the last visit, started daily Lasix, ultrasound was negative for DVT. Migraines: On verapamil, request 90-day supply  Wt Readings from Last 3 Encounters:  05/21/17 (!) 379 lb 2 oz (172 kg)  05/14/17 (!) 386 lb (175.1 kg)  05/08/17 (!) 371 lb (168.3 kg)     Review of Systems Denies fever, chills. No nausea, vomiting, diarrhea. Lower extremity warmness and pain have significantly decreased  Past Medical History:  Diagnosis Date  . Anemia   . Anxiety and depression    h/o suicidality w/ citalopram  . Back pain   . Chest pain   . Difficult intubation   . ETD (eustachian tube dysfunction)    s/p ENT, declined ear tuves before   . GERD (gastroesophageal reflux disease)   . History of chicken pox    Titered on 08/16/2005  . Hypogonadism male    Dr Cruzita Lederer, used to see urology  . Infertility male   . Influenza 02/2016  . Internal hemorrhoids    s/p banding  . Low testosterone   . Migraine    on topamax  . OSA on CPAP   . Palpitations   . 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 Prediabetes    A1C 6.2 years ago  . 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 Vitamin B 12 deficiency    h/o  . Vitamin D deficiency     Past Surgical History:  Procedure Laterality Date  . 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 TONSILLECTOMY AND ADENOIDECTOMY      Social History   Socioeconomic History  . Marital status: Married    Spouse name: Not on file  . Number of children: 0  . Years of education: Not on file  . Highest education level: Not on file  Occupational History  . Occupation: Therapist, sports, [employer] short stay   Employer: [employer]  Social Needs  . Financial resource strain: Not on file  . Food insecurity:    Worry: Not on file    Inability: Not on file  . Transportation needs:    Medical: Not on file    Non-medical: Not on file  Tobacco Use  . Smoking status: Never Smoker  . Smokeless tobacco: Never Used  Substance and Sexual Activity  . Alcohol use: Yes    Alcohol/week: 0.0 oz    Comment: occ  . Drug use: No  . Sexual activity: Not on file  Lifestyle  . Physical activity:    Days per week: Not on file    Minutes per session: Not on file  . Stress: Not on file  Relationships  . Social connections:    Talks on phone: Not on file    Gets together: Not on file    Attends religious service: Not on file    Active member of club or organization: Not on file    Attends meetings of clubs or organizations: Not on file    Relationship status: Not on file  . Intimate partner violence:    Fear of current or ex partner: Not on file  Emotionally abused: Not on file    Physically abused: Not on file    Forced sexual activity: Not on file  Other Topics Concern  . Not on file  Social History Narrative   Lives w/ wife      Allergies as of 05/21/2017      Reactions   Levaquin [levofloxacin In D5w] Swelling   Celexa [citalopram] Other (See Comments)   Mental status changes    Dextrans Other (See Comments)   Makes the pt. Drowsy.    Hydrocodone-acetaminophen    REACTION: itching   Latex    Oxycodone Itching      Medication List        Accurate as of 05/21/17  8:30 AM. Always use your most recent med list.          ACCU-CHEK FASTCLIX LANCETS Misc 1 Package by Does not apply route 2 (two) times daily.   B COMPLEX-B12 PO Take 1 tablet by mouth daily.   buPROPion 150 MG 12 hr tablet Commonly known as:  WELLBUTRIN SR Take 1 tablet (150 mg total) by mouth daily.   doxycycline 100 MG tablet Commonly known as:  VIBRA-TABS Take 1 tablet (100 mg total) by mouth 2 (two) times  daily.   fexofenadine 180 MG tablet Commonly known as:  ALLEGRA Take 180 mg by mouth daily.   fluticasone 50 MCG/ACT nasal spray Commonly known as:  FLONASE Place 2 sprays into both nostrils daily.   furosemide 20 MG tablet Commonly known as:  LASIX Take 1 tablet (20 mg total) by mouth daily.   glucose blood test strip Commonly known as:  ACCU-CHEK GUIDE 1 each by Other route 2 (two) times daily. Use as instructed   Insulin Pen Needle 32G X 6 MM Misc Commonly known as:  TECHLITE PEN NEEDLES USE AS DIRECTED TO INJECT VICTOZA   MAG-OXIDE PO Take 500 mg by mouth daily.   metFORMIN 500 MG 24 hr tablet Commonly known as:  GLUCOPHAGE-XR TAKE 2 TABLETS BY MOUTH DAILY WITH BREAKFAST.   metoprolol succinate 25 MG 24 hr tablet Commonly known as:  TOPROL-XL Take half tab daily   multivitamin tablet Take 1 tablet by mouth daily.   pantoprazole 40 MG tablet Commonly known as:  PROTONIX Take 1 tablet (40 mg total) by mouth daily.   SLOW RELEASE IRON 160 (50 Fe) MG Tbcr SR tablet Generic drug:  ferrous sulfate Take 1 tablet by mouth daily.   verapamil 240 MG CR tablet Commonly known as:  CALAN-SR Take 1 tablet (240 mg total) at bedtime by mouth.   Vitamin D (Ergocalciferol) 50000 units Caps capsule Commonly known as:  DRISDOL Take 1 capsule (50,000 Units total) by mouth every 7 (seven) days.          Objective:   Physical Exam BP 134/78 (BP Location: Right Arm, Patient Position: Sitting, Cuff Size: Normal)   Pulse 89   Temp 97.9 F (36.6 C) (Oral)   Resp 14   Ht 5\' 11"  (1.803 m)   Wt (!) 379 lb 2 oz (172 kg)   SpO2 97%   BMI 52.88 kg/m  General:   Well developed, well nourished . NAD.  HEENT:  Normocephalic . Face symmetric, atraumatic Lungs:  CTA B Normal respiratory effort, no intercostal retractions, no accessory muscle use. Heart: RRR,  no murmur.  Trace  pretibial pitting edema bilaterally Left calf: 20 inches in circumference Right calf: 19 inches  in circumference Skin: See picture, pretibial skin is not TTP  or warm to touch Neurologic:  alert & oriented X3.  Speech normal, gait appropriate for age and unassisted Psych--  Cognition and judgment appear intact.  Cooperative with normal attention span and concentration.  Behavior appropriate. No anxious or depressed appearing.        Assessment & Plan:   Assessment   Prediabetes-- on metformin x years  Anxiety depression (suicidality w/  Citalopram) Morbid obesity Vitamin D and  B12 deficiency Chronic anemia  Endocrinology:Dr Gherghe --Pituitary mass, surgery : Prolactinoma --Hypogonadism hypogonadotropic --Infertility Migraines, d/c Topamax 05-2015 (pt concerned about short term memory), started verapamil OSA on CPAP -- DR Halford Chessman Mild anemia, no previous colonoscopy or EGD. No symptoms. Iron , vitamins normal. H/o ET  dysfunction, declined ear tubes H/o hemorrhoids s/p  banding H/o abd pain 2012 (w/u CT, [place], a HIDA scan), similar sx 02-2015 H/o  difficulty intubation   PLAN: Cellulitis: Improving, finish antibiotics. LE edema, assymetric: Likely multifactorial including obesity.  Started Lasix daily a few days ago, edema improved, left calf is larger, ultrasound few days ago negative for DVT. Plan: Long-term management with leg elevation, low-salt diet and daily Lasix.  Check a BMP and magnesium. Migraine prevention: Refill verapamil GERD: Refill Protonix Morbid obesity: Victoza was discontinue and since then he is not having palpitations, on metoprolol as needed as he has not needed it . Metformin dose was increased by the bariatric office.  he has lost several pounds since the last visit, related to Lasix?Marland Kitchen RTC a few months, CPX

## 2017-05-21 NOTE — Progress Notes (Signed)
Pre visit review using our clinic review tool, if applicable. No additional management support is needed unless otherwise documented below in the visit note. 

## 2017-05-22 NOTE — Assessment & Plan Note (Signed)
Cellulitis: Improving, finish antibiotics. LE edema, assymetric: Likely multifactorial including obesity.  Started Lasix daily a few days ago, edema improved, left calf is larger, ultrasound few days ago negative for DVT. Plan: Long-term management with leg elevation, low-salt diet and daily Lasix.  Check a BMP and magnesium. Migraine prevention: Refill verapamil GERD: Refill Protonix Morbid obesity: Victoza was discontinue and since then he is not having palpitations, on metoprolol as needed as he has not needed it . Metformin dose was increased by the bariatric office.  he has lost several pounds since the last visit, related to Lasix?Marland Kitchen RTC a few months, CPX

## 2017-05-28 ENCOUNTER — Ambulatory Visit (INDEPENDENT_AMBULATORY_CARE_PROVIDER_SITE_OTHER): Payer: Managed Care, Other (non HMO) | Admitting: Physician Assistant

## 2017-05-28 VITALS — BP 124/79 | HR 80 | Temp 98.4°F | Ht 71.0 in | Wt 381.0 lb

## 2017-05-28 DIAGNOSIS — Z9189 Other specified personal risk factors, not elsewhere classified: Secondary | ICD-10-CM

## 2017-05-28 DIAGNOSIS — R002 Palpitations: Secondary | ICD-10-CM

## 2017-05-28 DIAGNOSIS — Z6841 Body Mass Index (BMI) 40.0 and over, adult: Secondary | ICD-10-CM | POA: Diagnosis not present

## 2017-05-28 DIAGNOSIS — E559 Vitamin D deficiency, unspecified: Secondary | ICD-10-CM

## 2017-05-28 DIAGNOSIS — E119 Type 2 diabetes mellitus without complications: Secondary | ICD-10-CM

## 2017-05-28 DIAGNOSIS — F3289 Other specified depressive episodes: Secondary | ICD-10-CM | POA: Diagnosis not present

## 2017-05-28 MED ORDER — BUPROPION HCL ER (SR) 150 MG PO TB12: 150.0000 mg | ORAL_TABLET | Freq: Every day | ORAL | 0 refills | Status: DC

## 2017-05-28 MED ORDER — METFORMIN HCL ER 500 MG PO TB24: 1000.0000 mg | ORAL_TABLET | Freq: Two times a day (BID) | ORAL | 0 refills | Status: DC

## 2017-05-28 MED ORDER — VITAMIN D (ERGOCALCIFEROL) 1.25 MG (50000 UNIT) PO CAPS: 50000.0000 [IU] | ORAL_CAPSULE | ORAL | 0 refills | Status: DC

## 2017-05-28 NOTE — Progress Notes (Signed)
Office: [phone]  /  Fax: [phone]   HPI:   Chief Complaint: OBESITY Julian Washington is here to discuss his progress with his obesity treatment plan. He is on the portion control better and make smarter food choices plan and is following his eating plan approximately 50 to 60 % of the time. He states he is exercising 0 minutes 0 times per week. Julian Washington states he is mindful of his eating, however, since he has not been on the structured meal plan, he may not be getting all the recommended protein. His weight is (!) 381 lb (172.8 kg) today and has had a weight gain of 10 pounds over a period of 3 weeks since his last visit. He has lost 15 lbs since starting treatment with [place].  Vitamin D deficiency Julian Washington has a diagnosis of vitamin D deficiency. He is currently taking vit D and denies nausea, vomiting or muscle weakness.  At risk for osteopenia and osteoporosis Julian Washington is at higher risk of osteopenia and osteoporosis due to vitamin D deficiency.   Diabetes II non insulin without complications Julian Washington has a diagnosis of diabetes type II. Julian Washington is not checking his blood sugar at home and he denies any hypoglycemic episodes. He has been working on intensive lifestyle modifications including diet, exercise, and weight loss to help control his blood glucose levels.  Palpitations Julian Washington denies any recurrence of the palpitations since he has stopped Julian Washington. Also, he states he has not been taking his Metoprolol, as they were only written by Cardiologist for Julian Washington-assosiated palpitations.  Depression with emotional eating behaviors Julian Washington is struggling with emotional eating and using food for comfort to the extent that it is negatively impacting his health. He often snacks when he is not hungry. Julian Washington sometimes feels he is out of control and then feels guilty that he made poor food choices. He has been working on behavior modification techniques to help reduce his emotional eating and has been  somewhat successful. His mood is stable and he shows no sign of suicidal or homicidal ideations.  Depression screen Julian Washington 2/9 07/11/2016 01/17/2016 08/31/2015 07/26/2015 04/06/2015  Decreased Interest 2 0 0 0 0  Down, Depressed, Hopeless 2 0 0 0 0  PHQ - 2 Score 4 0 0 0 0  Altered sleeping 1 - - - -  Tired, decreased energy 3 - - - -  Change in appetite 3 - - - -  Feeling bad or failure about yourself  2 - - - -  Trouble concentrating 2 - - - -  Moving slowly or fidgety/restless 3 - - - -  Suicidal thoughts 1 - - - -  PHQ-9 Score 19 - - - -      ALLERGIES: Allergies  Allergen Reactions  . Levaquin [Levofloxacin In D5w] Swelling  . Celexa [Citalopram] Other (See Comments)    Mental status changes   . Dextrans Other (See Comments)    Makes the pt. Drowsy.   . Hydrocodone-Acetaminophen     REACTION: itching  . Latex   . Oxycodone Itching    MEDICATIONS: Current Outpatient Medications on File Prior to Visit  Medication Sig Dispense Refill  . ACCU-CHEK FASTCLIX LANCETS MISC 1 Package by Does not apply route 2 (two) times daily. 100 each 0  . B Complex Vitamins (B COMPLEX-B12 PO) Take 1 tablet by mouth daily.     . ferrous sulfate (SLOW RELEASE IRON) 160 (50 Fe) MG TBCR SR tablet Take 1 tablet by mouth daily.    Marland Kitchen  fexofenadine (ALLEGRA) 180 MG tablet Take 180 mg by mouth daily.    . fluticasone (FLONASE) 50 MCG/ACT nasal spray Place 2 sprays into both nostrils daily.    . furosemide (LASIX) 20 MG tablet Take 1 tablet (20 mg total) by mouth daily. 90 tablet 1  . glucose blood (ACCU-CHEK GUIDE) test strip 1 each by Other route 2 (two) times daily. Use as instructed 100 each 0  . Insulin [place] Needle (TECHLITE Julian [place] NEEDLES) 32G X 6 MM MISC USE AS DIRECTED TO INJECT Julian [place] 14 each 0  . Magnesium Oxide (MAG-OXIDE PO) Take 500 mg by mouth daily.    . Multiple Vitamin (MULTIVITAMIN) tablet Take 1 tablet by mouth daily.     . pantoprazole (PROTONIX) 40 MG tablet Take 1 tablet (40 mg total) by  mouth daily. 90 tablet 3  . verapamil (CALAN-SR) 240 MG CR tablet Take 1 tablet (240 mg total) by mouth at bedtime. 90 tablet 3   No current facility-administered medications on file prior to visit.     PAST MEDICAL HISTORY: Past Medical History:  Diagnosis Date  . Anemia   . Anxiety and depression    h/o suicidality w/ citalopram  . Back pain   . Chest pain   . Difficult intubation   . ETD (eustachian tube dysfunction)    s/p ENT, declined ear tuves before   . GERD (gastroesophageal reflux disease)   . History of chicken pox    Titered on 08/16/2005  . Hypogonadism male    Dr Cruzita Lederer, used to see urology  . Infertility male   . Influenza 02/2016  . Internal hemorrhoids    s/p banding  . Low testosterone   . Migraine    on topamax  . OSA on CPAP   . Palpitations   . 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 Prediabetes    A1C 6.2 years ago  . 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 Vitamin B 12 deficiency    h/o  . Vitamin D deficiency     PAST SURGICAL HISTORY: Past Surgical History:  Procedure Laterality Date  . 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 TONSILLECTOMY AND ADENOIDECTOMY      SOCIAL HISTORY: Social History   Tobacco Use  . Smoking status: Never Smoker  . Smokeless tobacco: Never Used  Substance Use Topics  . Alcohol use: Yes    Alcohol/week: 0.0 oz    Comment: occ  . Drug use: No    FAMILY HISTORY: Family History  Problem Relation Age of Onset  . Obesity Mother   . Depression Father   . Anxiety disorder Father   . Bipolar disorder Father   . Drug abuse Father   . Diabetes Paternal Grandmother   . Hypertension Paternal Grandmother   . Diabetes Paternal Grandfather   . Prostate cancer Other        GF  . CAD Other        PGF  . Colon cancer Neg Hx     ROS: Review of Systems  Constitutional: Negative for weight loss.  Cardiovascular: Negative for  palpitations.  Gastrointestinal: Negative for nausea and vomiting.  Musculoskeletal:       Negative for muscle weakness  Endo/Heme/Allergies:       Negative for hypoglycemia  Psychiatric/Behavioral: Positive for depression. Negative for suicidal ideas.    PHYSICAL EXAM: Blood pressure 124/79, pulse 80,  temperature 98.4 F (36.9 C), height 5\' 11"  (1.803 m), weight (!) 381 lb (172.8 kg), SpO2 99 %. Body mass index is 53.14 kg/m. Physical Exam  Constitutional: He is oriented to person, place, and time. He appears well-developed and well-nourished.  Cardiovascular: Normal rate.  Pulmonary/Chest: Effort normal.  Musculoskeletal: Normal range of motion.  Neurological: He is oriented to person, place, and time.  Skin: Skin is warm and dry.  Psychiatric: He has a normal mood and affect. His behavior is normal.  Vitals reviewed.   RECENT LABS AND TESTS: BMET    Component Value Date/Time   NA 139 05/21/2017 0858   NA 141 11/20/2016 0811   K 4.2 05/21/2017 0858   CL 105 05/21/2017 0858   CO2 26 05/21/2017 0858   GLUCOSE 116 (H) 05/21/2017 0858   BUN 20 05/21/2017 0858   BUN 18 11/20/2016 0811   CREATININE 0.78 05/21/2017 0858   CREATININE 0.73 01/19/2016 1624   CALCIUM 9.8 05/21/2017 0858   GFRNONAA 86 11/20/2016 0811   GFRAA 99 11/20/2016 0811   Lab Results  Component Value Date   HGBA1C 5.5 11/20/2016   HGBA1C 6.2 (H) 07/11/2016   HGBA1C 5.7 01/10/2016   HGBA1C 5.8 07/06/2015   HGBA1C 6.2 01/25/2015   Lab Results  Component Value Date   INSULIN 38.7 (H) 11/20/2016   INSULIN 36.7 (H) 07/11/2016   CBC    Component Value Date/Time   WBC 7.0 11/20/2016 0811   WBC 8.6 05/06/2016 1126   RBC 4.58 11/20/2016 0811   RBC 4.73 05/06/2016 1126   HGB 12.1 (L) 11/20/2016 0811   HCT 36.9 (L) 11/20/2016 0811   PLT 197.0 05/06/2016 1126   MCV 81 11/20/2016 0811   MCH 26.4 (L) 11/20/2016 0811   MCH 27.4 02/05/2016 1730   MCHC 32.8 11/20/2016 0811   MCHC 33.1 05/06/2016 1126    RDW 14.8 11/20/2016 0811   LYMPHSABS 1.9 11/20/2016 0811   MONOABS 0.5 05/06/2016 1126   EOSABS 0.1 11/20/2016 0811   BASOSABS 0.0 11/20/2016 0811   Iron/TIBC/Ferritin/ %Sat    Component Value Date/Time   IRON 37 (L) 11/20/2016 0811   TIBC 246 (L) 11/20/2016 0811   FERRITIN 156 11/20/2016 0811   IRONPCTSAT 15 11/20/2016 0811   Lipid Panel     Component Value Date/Time   CHOL 122 11/20/2016 0811   TRIG 118 11/20/2016 0811   HDL 33 (L) 11/20/2016 0811   CHOLHDL 3 07/26/2015 1030   VLDL 12.0 07/26/2015 1030   LDLCALC 65 11/20/2016 0811   Hepatic Function Panel     Component Value Date/Time   PROT 6.4 11/20/2016 0811   ALBUMIN 4.4 11/20/2016 0811   AST 10 11/20/2016 0811   ALT 14 11/20/2016 0811   ALKPHOS 77 11/20/2016 0811   BILITOT 0.2 11/20/2016 0811      Component Value Date/Time   TSH 2.71 02/10/2017 0836   TSH 2.950 07/11/2016 1018   TSH 2.275 02/05/2016 1730   TSH 1.27 01/10/2016 0835   Results for Alig, FARUQ ROSENBERGER (MRN [id number]) as of 05/28/2017 12:59  Ref. Range 11/20/2016 08:11  Vitamin D, 25-Hydroxy Latest Ref Range: 30.0 - 100.0 ng/mL 37.0   ASSESSMENT AND PLAN: Vitamin D deficiency - Plan: Vitamin D, Ergocalciferol, (DRISDOL) 50000 units CAPS capsule  At risk for osteoporosis  Other depression - with emotional eating - Plan: buPROPion (WELLBUTRIN SR) 150 MG 12 hr tablet  Type 2 diabetes mellitus without complication, without long-term current use of insulin ([place]) - Plan:  metFORMIN (GLUCOPHAGE-XR) 500 MG 24 hr tablet  Class 3 severe obesity with serious comorbidity and body mass index (BMI) of 50.0 to 59.9 in adult, unspecified obesity type ([place])  PLAN:  Vitamin D Deficiency Julian Washington was informed that low vitamin D levels contributes to fatigue and are associated with obesity, breast, and colon cancer. He agrees to continue to take prescription Vit D @50 ,000 IU every week #4 with no refills and will follow up for routine testing of vitamin D, at least  2-3 times per year. He was informed of the risk of over-replacement of vitamin D and agrees to not increase his dose unless he discusses this with [place] first. Julian Washington agrees to follow up with our clinic in 2 weeks.  At risk for osteopenia and osteoporosis Julian Washington is at risk for osteopenia and osteoporosis due to his vitamin D deficiency. He was encouraged to take his vitamin D and follow his higher calcium diet and increase strengthening exercise to help strengthen his bones and decrease his risk of osteopenia and osteoporosis.  Diabetes II non insulin without complications Julian Washington has been given extensive diabetes education by myself today including ideal fasting and post-prandial blood glucose readings, individual ideal Hgb A1c goals and hypoglycemia prevention. We discussed the importance of good blood sugar control to decrease the likelihood of diabetic complications such as nephropathy, neuropathy, limb loss, blindness, coronary artery disease, and death. We discussed the importance of intensive lifestyle modification including diet, exercise and weight loss as the first line treatment for diabetes. Julian Washington agrees to continue metformin 1,000 mg bid #180 with no refills and follow up at the agreed upon time.  Palpitations Julian [place] agrees if any recurrence of palpitations, to follow up with his cardiologist.   Depression with Emotional Eating Behaviors We discussed behavior modification techniques today to help Julian Washington deal with his emotional eating and depression. He has agreed to take Wellbutrin SR 150 mg qd #90 with no refills and follow up as directed.  Obesity Julian Washington is currently in the action stage of change. As such, his goal is to continue with weight loss efforts He has agreed to follow the Category 4 plan B[place] has been instructed to work up to a goal of 150 minutes of combined cardio and strengthening exercise per week for weight loss and overall health benefits. We discussed the following  Behavioral Modification Strategies today: increasing lean protein intake and work on meal planning and easy cooking plans  Julian Washington has agreed to follow up with our clinic in 2 weeks. He was informed of the importance of frequent follow up visits to maximize his success with intensive lifestyle modifications for his multiple health conditions.    OBESITY BEHAVIORAL INTERVENTION VISIT  Today's visit was # 16 out of 22.  Starting weight: 396 lbs Starting date: 07/11/16 Today's weight : 381 lbs Today's date: 05/28/2017 Total lbs lost to date: 15 (Patients must lose 7 lbs in the first 6 months to continue with counseling)   ASK: We discussed the diagnosis of obesity with Julian Washington today and Julian Washington agreed to give [place] permission to discuss obesity behavioral modification therapy today.  ASSESS: Julian Washington has the diagnosis of obesity and his BMI today is 53.16 Julian Washington is in the action stage of change   ADVISE: Julian Washington was educated on the multiple health risks of obesity as well as the benefit of weight loss to improve his health. He was advised of the need for long term treatment and the importance of lifestyle modifications.  AGREE:  Multiple dietary modification options and treatment options were discussed and  Julian Washington agreed to the above obesity treatment plan.   Corey Skains, am acting as transcriptionist for [employer], PA-C I, Lacy Duverney [facility], have reviewed this note and agree with its content

## 2017-05-29 ENCOUNTER — Ambulatory Visit: Payer: Managed Care, Other (non HMO) | Admitting: Internal Medicine

## 2017-05-29 MED ORDER — BUPROPION HCL ER (SR) 150 MG PO TB12: 150.0000 mg | ORAL_TABLET | Freq: Every day | ORAL | 0 refills | Status: DC

## 2017-05-29 NOTE — Addendum Note (Signed)
Addended by: Lawana Chambers on: 05/29/2017 07:55 AM   Modules accepted: Orders

## 2017-05-31 ENCOUNTER — Telehealth: Payer: Managed Care, Other (non HMO) | Admitting: Physician Assistant

## 2017-05-31 DIAGNOSIS — L03119 Cellulitis of unspecified part of limb: Secondary | ICD-10-CM

## 2017-05-31 DIAGNOSIS — R609 Edema, unspecified: Secondary | ICD-10-CM

## 2017-05-31 NOTE — Progress Notes (Signed)
Based on what you shared with me it looks like you have a serious condition that should be evaluated in a face to face office visit. Unfortunately, cases of edema and cellulitis are outside the scope of what we can treat via e-visit. If symptoms are still present despite treatment with antibiotic it could be that a longer course is needed, but also could be that a different antibiotic is warranted. Also you may require medication to help get rid of the swelling which will help the cellulitis resolve and be less likely to recur. As such, please be seen today at an Urgent Care. There is also a Saturday Clinic for [place] at the office on [address] in [place]. The number is [phone] so that you can call to be seen this morning.   NOTE: If you entered your credit card information for this eVisit, you will not be charged. You may see a "hold" on your card for the $30 but that hold will drop off and you will not have a charge processed.  If you are having a true medical emergency please call 911.  If you need an urgent face to face visit, [facility] has four urgent care centers for your convenience.  If you need care fast and have a high deductible or no insurance consider:   [email or web address] to reserve your spot online an avoid wait times  [facility] [address] [place], [place] [address] 8 am to 8 pm Monday-Friday 10 am to 4 pm Saturday-Sunday *Across the street from [employer]  [place], [address] 8 am to 5 pm Monday-Friday * In the [facility] on the [facility]   The following sites will take your  insurance:  . [facility] [place] a Provider at this Location  [address] [place], [place] [address] . 10 am to 8 pm Monday-Friday . 12 pm to 8 pm Saturday-Sunday   . [facility] [place] a Provider at this Location  [address], [place], [place] [address] . 8 am to 8 pm Monday-Friday . 9 am to 6 pm Saturday . 11 am to 6 pm Sunday   . [facility] at [place] Get Driving Directions  [address] [place],  [address] . 8 am to 8 pm Monday-Friday . 8 am to 4 pm Saturday-Sunday   Your e-visit answers were reviewed by a board certified advanced clinical practitioner to complete your personal care plan.  Thank you for using e-Visits.

## 2017-06-08 ENCOUNTER — Encounter (HOSPITAL_COMMUNITY): Payer: Self-pay | Admitting: Family Medicine

## 2017-06-08 ENCOUNTER — Ambulatory Visit (HOSPITAL_COMMUNITY)
Admission: EM | Admit: 2017-06-08 | Discharge: 2017-06-08 | Disposition: A | Discharge status: Home / Self Care | Payer: Managed Care, Other (non HMO) | Attending: Urgent Care | Admitting: Urgent Care

## 2017-06-08 DIAGNOSIS — L03116 Cellulitis of left lower limb: Secondary | ICD-10-CM | POA: Diagnosis not present

## 2017-06-08 DIAGNOSIS — M7989 Other specified soft tissue disorders: Secondary | ICD-10-CM | POA: Diagnosis not present

## 2017-06-08 DIAGNOSIS — Z885 Allergy status to narcotic agent status: Secondary | ICD-10-CM | POA: Diagnosis not present

## 2017-06-08 DIAGNOSIS — G4733 Obstructive sleep apnea (adult) (pediatric): Secondary | ICD-10-CM | POA: Insufficient documentation

## 2017-06-08 DIAGNOSIS — Z91040 Latex allergy status: Secondary | ICD-10-CM | POA: Diagnosis not present

## 2017-06-08 DIAGNOSIS — Z881 Allergy status to other antibiotic agents status: Secondary | ICD-10-CM | POA: Insufficient documentation

## 2017-06-08 DIAGNOSIS — Z79899 Other long term (current) drug therapy: Secondary | ICD-10-CM | POA: Insufficient documentation

## 2017-06-08 DIAGNOSIS — L03115 Cellulitis of right lower limb: Secondary | ICD-10-CM | POA: Diagnosis not present

## 2017-06-08 DIAGNOSIS — R7303 Prediabetes: Secondary | ICD-10-CM | POA: Diagnosis not present

## 2017-06-08 DIAGNOSIS — E559 Vitamin D deficiency, unspecified: Secondary | ICD-10-CM | POA: Insufficient documentation

## 2017-06-08 LAB — CBC
HEMATOCRIT: 37.4 % — AB (ref 39.0–52.0)
HEMOGLOBIN: 12.4 g/dL — AB (ref 13.0–17.0)
MCH: 27.3 pg (ref 26.0–34.0)
MCHC: 33.2 g/dL (ref 30.0–36.0)
MCV: 82.4 fL (ref 78.0–100.0)
Platelets: 153 10*3/uL (ref 150–400)
RBC: 4.54 MIL/uL (ref 4.22–5.81)
RDW: 13.8 % (ref 11.5–15.5)
WBC: 9.2 10*3/uL (ref 4.0–10.5)

## 2017-06-08 NOTE — ED Provider Notes (Signed)
MRN: [id number] DOB: 12-Dec-1980  Subjective:   Julian Washington is a 37 y.o. male presenting for persistent bilateral lower leg swelling, warmth and drainage.  Patient has been managed for cellulitis for the past month with doxycycline.  He was having improvement until yesterday.  Patient states that he had a full work day, works as a Marine scientist.  Reports that his swelling, warmth and redness returned very rapidly.  He has tried to keep his legs propped up but reports that has done better when they have not been propped up.  He has not checked in with his PCP but plans on setting up a follow-up appointment with Dr. Larose Kells soon.  His primary concern today is to make sure that we check his white blood cell counts.  No current facility-administered medications for this encounter.   Current Outpatient Medications:  .  ACCU-CHEK FASTCLIX LANCETS MISC, 1 Package by Does not apply route 2 (two) times daily., Disp: 100 each, Rfl: 0 .  B Complex Vitamins (B COMPLEX-B12 PO), Take 1 tablet by mouth daily. , Disp: , Rfl:  .  buPROPion (WELLBUTRIN SR) 150 MG 12 hr tablet, Take 1 tablet (150 mg total) by mouth daily., Disp: 90 tablet, Rfl: 0 .  ferrous sulfate (SLOW RELEASE IRON) 160 (50 Fe) MG TBCR SR tablet, Take 1 tablet by mouth daily., Disp: , Rfl:  .  fexofenadine (ALLEGRA) 180 MG tablet, Take 180 mg by mouth daily., Disp: , Rfl:  .  fluticasone (FLONASE) 50 MCG/ACT nasal spray, Place 2 sprays into both nostrils daily., Disp: , Rfl:  .  furosemide (LASIX) 20 MG tablet, Take 1 tablet (20 mg total) by mouth daily., Disp: 90 tablet, Rfl: 1 .  glucose blood (ACCU-CHEK GUIDE) test strip, 1 each by Other route 2 (two) times daily. Use as instructed, Disp: 100 each, Rfl: 0 .  Insulin Pen Needle (TECHLITE PEN NEEDLES) 32G X 6 MM MISC, USE AS DIRECTED TO INJECT VICTOZA, Disp: 14 each, Rfl: 0 .  Magnesium Oxide (MAG-OXIDE PO), Take 500 mg by mouth daily., Disp: , Rfl:  .  metFORMIN (GLUCOPHAGE-XR) 500 MG 24 hr tablet, Take 2  tablets (1,000 mg total) by mouth 2 (two) times daily., Disp: 180 tablet, Rfl: 0 .  Multiple Vitamin (MULTIVITAMIN) tablet, Take 1 tablet by mouth daily. , Disp: , Rfl:  .  pantoprazole (PROTONIX) 40 MG tablet, Take 1 tablet (40 mg total) by mouth daily., Disp: 90 tablet, Rfl: 3 .  verapamil (CALAN-SR) 240 MG CR tablet, Take 1 tablet (240 mg total) by mouth at bedtime., Disp: 90 tablet, Rfl: 3 .  Vitamin D, Ergocalciferol, (DRISDOL) 50000 units CAPS capsule, Take 1 capsule (50,000 Units total) by mouth every 7 (seven) days., Disp: 4 capsule, Rfl: 0    Allergies  Allergen Reactions  . Levaquin [Levofloxacin In D5w] Swelling  . Celexa [Citalopram] Other (See Comments)    Mental status changes   . Dextrans Other (See Comments)    Makes the pt. Drowsy.   . Hydrocodone-Acetaminophen     REACTION: itching  . Latex   . Oxycodone Itching    Past Medical History:  Diagnosis Date  . Anemia   . Anxiety and depression    h/o suicidality w/ citalopram  . Back pain   . Chest pain   . Difficult intubation   . ETD (eustachian tube dysfunction)    s/p ENT, declined ear tuves before   . GERD (gastroesophageal reflux disease)   . History of chicken pox  Titered on 08/16/2005  . Hypogonadism male    Dr Cruzita Lederer, used to see urology  . Infertility male   . Influenza 02/2016  . Internal hemorrhoids    s/p banding  . Low testosterone   . Migraine    on topamax  . OSA on CPAP   . Palpitations   . 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 Prediabetes    A1C 6.2 years ago  . 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 Vitamin B 12 deficiency    h/o  . Vitamin D deficiency      Past Surgical History:  Procedure Laterality Date  . 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 TONSILLECTOMY AND ADENOIDECTOMY      Objective:   Vitals: BP (!) 177/79 (BP Location: Right Arm)   Pulse 88   Temp 98.4 F (36.9 C)   Resp  18   SpO2 100%   BP Readings from Last 3 Encounters:  06/08/17 (!) 177/79  05/28/17 124/79  05/21/17 134/78   BP 133/92 on recheck by PA-Mariona Scholes.  Physical Exam  Constitutional: He is oriented to person, place, and time. He appears well-developed and well-nourished.  Cardiovascular: Normal rate, regular rhythm and intact distal pulses. Exam reveals no gallop and no friction rub.  No murmur heard. Pulmonary/Chest: No respiratory distress. He has no wheezes. He has no rales.  Neurological: He is alert and oriented to person, place, and time.  Skin: Skin is warm and dry.  Patient has bilateral lower extremity erythema, edema.  There is minimal tenderness.   Assessment and Plan :   Bilateral cellulitis of lower leg  Swelling of lower leg  Patient has had a ultrasound that was negative for DVT.  He is still taking doxycycline.  I will recheck his CBC and emphasized need for follow-up with his PCP.  Patient is in agreement with treatment plan.     Jaynee Eagles, [place] 06/09/17 1954

## 2017-06-08 NOTE — ED Triage Notes (Signed)
Pt here for BLE redness and swelling. Warm to touch. He is currently taking doxycycline and he was better but after working yesterday worsened.

## 2017-06-24 ENCOUNTER — Other Ambulatory Visit: Payer: Self-pay | Admitting: Internal Medicine

## 2017-07-22 ENCOUNTER — Other Ambulatory Visit: Payer: Self-pay | Admitting: *Deleted

## 2017-07-22 ENCOUNTER — Telehealth: Payer: Self-pay | Admitting: Vascular Surgery

## 2017-07-22 DIAGNOSIS — I83893 Varicose veins of bilateral lower extremities with other complications: Secondary | ICD-10-CM

## 2017-07-22 NOTE — Telephone Encounter (Signed)
sch appt lvm 08/12/17 12pm LE Reflux 1pm New VV MD

## 2017-08-01 ENCOUNTER — Telehealth: Payer: Self-pay | Admitting: Internal Medicine

## 2017-08-01 DIAGNOSIS — E119 Type 2 diabetes mellitus without complications: Secondary | ICD-10-CM

## 2017-08-01 MED ORDER — METFORMIN HCL ER 500 MG PO TB24: 1000.0000 mg | ORAL_TABLET | Freq: Two times a day (BID) | ORAL | 0 refills | Status: DC

## 2017-08-01 NOTE — Addendum Note (Signed)
Addended by: Drucilla Schmidt on: 08/01/2017 04:21 PM   Modules accepted: Orders

## 2017-08-01 NOTE — Telephone Encounter (Signed)
Patient calling is [facility] longer [facility], they changed his metformin to 1000 mg ER 2 times a day.   Pt is asking that now that he is not seeing this MD anymore can Dr. Cruzita Lederer resume rx this medication for him.   Please advise and call pt  Would you like him to be seen for an appt? He is already out of this medication however, we can rx [phone]

## 2017-08-01 NOTE — Telephone Encounter (Signed)
We filled this in Jan. 2019. I went ahead and sent him in the refill since we had done it before and the pt was out. Please advise on below if you need him to come in to discuss

## 2017-08-04 ENCOUNTER — Encounter: Payer: Self-pay | Admitting: Internal Medicine

## 2017-08-04 MED ORDER — VERAPAMIL HCL ER 240 MG PO TBCR: 240.0000 mg | EXTENDED_RELEASE_TABLET | Freq: Every day | ORAL | 1 refills | Status: DC

## 2017-08-04 NOTE — Telephone Encounter (Signed)
Sure, we can continue this. No need for an immediate appt.

## 2017-08-06 ENCOUNTER — Other Ambulatory Visit: Payer: Self-pay

## 2017-08-06 MED ORDER — METFORMIN HCL ER (MOD) 1000 MG PO TB24: 2000.0000 mg | ORAL_TABLET | Freq: Every day | ORAL | 3 refills | Status: DC

## 2017-08-06 NOTE — Addendum Note (Signed)
Addended by: Renato Shin on: 08/06/2017 04:57 PM   Modules accepted: Orders

## 2017-08-06 NOTE — Telephone Encounter (Signed)
Patient would like to switch to metformin 1000 xr instead of the 500 is this ok to switch please advise in Dr. Arman Filter absence

## 2017-08-06 NOTE — Telephone Encounter (Signed)
Ok, I have sent a prescription to your pharmacy 

## 2017-08-06 NOTE — Telephone Encounter (Signed)
Called patient and he is aware. 

## 2017-08-12 ENCOUNTER — Encounter (HOSPITAL_COMMUNITY): Payer: Managed Care, Other (non HMO)

## 2017-08-12 ENCOUNTER — Encounter: Payer: Managed Care, Other (non HMO) | Admitting: Vascular Surgery

## 2017-09-03 ENCOUNTER — Other Ambulatory Visit: Payer: Self-pay

## 2017-09-03 ENCOUNTER — Ambulatory Visit: Payer: Managed Care, Other (non HMO) | Admitting: Vascular Surgery

## 2017-09-03 ENCOUNTER — Ambulatory Visit (HOSPITAL_COMMUNITY)
Admission: RE | Admit: 2017-09-03 | Discharge: 2017-09-03 | Disposition: A | Discharge status: Home / Self Care | Payer: Managed Care, Other (non HMO) | Source: Ambulatory Visit | Attending: Vascular Surgery | Admitting: Vascular Surgery

## 2017-09-03 ENCOUNTER — Encounter: Payer: Self-pay | Admitting: Vascular Surgery

## 2017-09-03 VITALS — BP 125/62 | HR 78 | Temp 98.6°F | Resp 16 | Ht 71.0 in | Wt 393.0 lb

## 2017-09-03 DIAGNOSIS — I872 Venous insufficiency (chronic) (peripheral): Secondary | ICD-10-CM | POA: Diagnosis not present

## 2017-09-03 DIAGNOSIS — I83893 Varicose veins of bilateral lower extremities with other complications: Secondary | ICD-10-CM | POA: Diagnosis present

## 2017-09-03 NOTE — Progress Notes (Signed)
Patient name: Julian Washington MRN: [id number] DOB: Nov 10, 1980 Sex: male   REASON FOR CONSULT:    Varicose veins.  HPI:   Julian Washington is a pleasant 37 y.o. male, who presents with bilateral lower extremity swelling.  He has had a long history of bilateral lower extremity swelling but this has been worse over the last 2 months.  This is been going on for over 2 years.  He recently had an episode of cellulitis in both legs and was treated with a 30-day course of doxycycline.  Ultimately the cellulitis resolved.  His swelling has been worse on the left side compared to the right.  He experiences aching pain and heaviness in both legs which is aggravated by standing and relieved somewhat with elevation.  He rarely takes ibuprofen for pain.  He does not wear compression stockings.  He does elevate his legs some.  He denies any previous abdominal surgery or inguinal surgery.  He denies any previous radiation therapy.  Past Medical History:  Diagnosis Date  . Anemia   . Anxiety and depression    h/o suicidality w/ citalopram  . Back pain   . Chest pain   . Difficult intubation   . ETD (eustachian tube dysfunction)    s/p ENT, declined ear tuves before   . GERD (gastroesophageal reflux disease)   . History of chicken pox    Titered on 08/16/2005  . Hypogonadism male    Dr Cruzita Lederer, used to see urology  . Infertility male   . Influenza 02/2016  . Internal hemorrhoids    s/p banding  . Low testosterone   . Migraine    on topamax  . OSA on CPAP   . Palpitations   . 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 Prediabetes    A1C 6.2 years ago  . 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 Vitamin B 12 deficiency    h/o  . Vitamin D deficiency     Family History  Problem Relation Age of Onset  . Obesity Mother   . Depression Father   . Anxiety disorder Father   . Bipolar disorder Father   . Drug abuse Father   .  Diabetes Paternal Grandmother   . Hypertension Paternal Grandmother   . Diabetes Paternal Grandfather   . Prostate cancer Other        GF  . CAD Other        PGF  . Colon cancer Neg Hx     SOCIAL HISTORY: Social History   Socioeconomic History  . Marital status: Married    Spouse name: Not on file  . Number of children: 0  . Years of education: Not on file  . Highest education level: Not on file  Occupational History  . Occupation: Therapist, sports, [employer] short stay    Employer: [employer]  Social Needs  . Financial resource strain: Not on file  . Food insecurity:    Worry: Not on file    Inability: Not on file  . Transportation needs:    Medical: Not on file    Non-medical: Not on file  Tobacco Use  . Smoking status: Never Smoker  . Smokeless tobacco: Never Used  Substance and Sexual Activity  . Alcohol use: Yes    Alcohol/week: 0.0 oz    Comment: occ  . Drug use: No  . Sexual activity: Not on  file  Lifestyle  . Physical activity:    Days per week: Not on file    Minutes per session: Not on file  . Stress: Not on file  Relationships  . Social connections:    Talks on phone: Not on file    Gets together: Not on file    Attends religious service: Not on file    Active member of club or organization: Not on file    Attends meetings of clubs or organizations: Not on file    Relationship status: Not on file  . Intimate partner violence:    Fear of current or ex partner: Not on file    Emotionally abused: Not on file    Physically abused: Not on file    Forced sexual activity: Not on file  Other Topics Concern  . Not on file  Social History Narrative   Lives w/ wife    Allergies  Allergen Reactions  . Levaquin [Levofloxacin In D5w] Swelling  . Celexa [Citalopram] Other (See Comments)    Mental status changes   . Dextrans Other (See Comments)    Makes the pt. Drowsy.   . Hydrocodone-Acetaminophen     REACTION: itching  . Latex   . Oxycodone Itching    Current  Outpatient Medications  Medication Sig Dispense Refill  . ACCU-CHEK FASTCLIX LANCETS MISC 1 Package by Does not apply route 2 (two) times daily. 100 each 0  . fexofenadine (ALLEGRA) 180 MG tablet Take 180 mg by mouth daily.    . fluticasone (FLONASE) 50 MCG/ACT nasal spray Place 2 sprays into both nostrils daily.    . furosemide (LASIX) 20 MG tablet Take 1 tablet (20 mg total) by mouth daily. 90 tablet 1  . glucose blood (ACCU-CHEK GUIDE) test strip 1 each by Other route 2 (two) times daily. Use as instructed 100 each 0  . Magnesium Oxide (MAG-OXIDE PO) Take 500 mg by mouth daily.    . metFORMIN (GLUMETZA) 1000 MG (MOD) 24 hr tablet Take 2 tablets (2,000 mg total) by mouth daily. 180 tablet 3  . Multiple Vitamin (MULTIVITAMIN) tablet Take 1 tablet by mouth daily.     . pantoprazole (PROTONIX) 40 MG tablet Take 1 tablet (40 mg total) by mouth daily. 90 tablet 3  . verapamil (CALAN-SR) 240 MG CR tablet Take 1 tablet (240 mg total) by mouth at bedtime. 90 tablet 1  . Vitamin D, Ergocalciferol, (DRISDOL) 50000 units CAPS capsule Take 1 capsule (50,000 Units total) by mouth every 7 (seven) days. 4 capsule 0  . B Complex Vitamins (B COMPLEX-B12 PO) Take 1 tablet by mouth daily.     Marland Kitchen buPROPion (WELLBUTRIN SR) 150 MG 12 hr tablet Take 1 tablet (150 mg total) by mouth daily. (Patient not taking: Reported on 09/03/2017) 90 tablet 0  . ferrous sulfate (SLOW RELEASE IRON) 160 (50 Fe) MG TBCR SR tablet Take 1 tablet by mouth daily.    . Insulin Pen Needle (TECHLITE PEN NEEDLES) 32G X 6 MM MISC USE AS DIRECTED TO INJECT VICTOZA (Patient not taking: Reported on 09/03/2017) 14 each 0   No current facility-administered medications for this visit.     REVIEW OF SYSTEMS:  [X]  denotes positive finding, [ ]  denotes negative finding Cardiac  Comments:  Chest pain or chest pressure:    Shortness of breath upon exertion: x   Short of breath when lying flat:    Irregular heart rhythm:        Vascular  Pain in  calf, thigh, or hip brought on by ambulation:    Pain in feet at night that wakes you up from your sleep:     Blood clot in your veins:    Leg swelling:  x       Pulmonary    Oxygen at home:    Productive cough:     Wheezing:         Neurologic    Sudden weakness in arms or legs:     Sudden numbness in arms or legs:     Sudden onset of difficulty speaking or slurred speech:    Temporary loss of vision in one eye:     Problems with dizziness:         Gastrointestinal    Blood in stool:     Vomited blood:         Genitourinary    Burning when urinating:     Blood in urine:        Psychiatric    Major depression:         Hematologic    Bleeding problems:    Problems with blood clotting too easily:        Skin    Rashes or ulcers: x       Constitutional    Fever or chills:     PHYSICAL EXAM:   Vitals:   09/03/17 1225  BP: 125/62  Pulse: 78  Resp: 16  Temp: 98.6 F (37 C)  TempSrc: Oral  SpO2: 98%  Weight: (!) 393 lb (178.3 kg)  Height: 5\' 11"  (1.803 m)    GENERAL: The patient is a well-nourished male, in no acute distress. The vital signs are documented above. CARDIAC: There is a regular rate and rhythm.  VASCULAR:  ARTERIAL: I do not detect carotid bruits.  Because of his leg swelling I cannot palpate pedal pulses.  However he has biphasic dorsalis pedis and posterior tibial signals bilaterally. VENOUS:  On the right side he has significant right lower extremity swelling.  He appears to have an eczematous rash on the anterior right leg but I am not convinced this is hyperpigmentation.  He has moderate right lower extremity swelling.  He has no significant varicose veins. On the left side he has significant lower extremity swelling.  Again he has an eczematous rash on the anterior leg but this does not appear to be hyperpigmentation.  He does not have any significant varicose veins. I looked at his left great saphenous vein with the SonoSite and it is not  especially enlarged. PULMONARY: There is good air exchange bilaterally without wheezing or rales. ABDOMEN: Soft and non-tender with normal pitched bowel sounds.  MUSCULOSKELETAL: There are no major deformities or cyanosis. NEUROLOGIC: No focal weakness or paresthesias are detected. SKIN: There are no ulcers or rashes noted. PSYCHIATRIC: The patient has a normal affect.  DATA:    VENOUS DUPLEX: I have independently interpreted his venous duplex scan today.  On the right side there is no evidence of deep venous thrombosis or superficial thrombophlebitis.  There is deep venous reflux involving the common femoral vein.  On the left side there is no evidence of deep venous thrombosis or superficial venous thrombosis.  There is deep venous reflux involving the common femoral vein.  There is superficial venous reflux involving the saphenofemoral junction and also some reflux in the great saphenous vein at the knee.  MEDICAL ISSUES:   COMBINED CHRONIC VENOUS INSUFFICIENCY AND LYMPHEDEMA: I think  this patient has combined chronic venous insufficiency and lymphedema.  We have discussed the importance of intermittent leg elevation and the proper positioning for this.  I written a prescription for knee-high compression stockings with a gradient of 15 to 20 mmHg.  I have encouraged him to avoid prolonged sitting and standing.  I have encouraged him to exercise and to consider water aerobics.  We also discussed the importance of weight management.  If his symptoms progressed and certainly we could consider getting him in thigh-high stockings although I think this would be less practical.  I will be happy to see him back at any time if his venous symptoms or leg swelling progresses.  Deitra Mayo [facility] [phone]

## 2017-09-18 ENCOUNTER — Encounter: Payer: Managed Care, Other (non HMO) | Admitting: Internal Medicine

## 2017-09-20 ENCOUNTER — Other Ambulatory Visit: Payer: Self-pay

## 2017-09-20 ENCOUNTER — Encounter (HOSPITAL_BASED_OUTPATIENT_CLINIC_OR_DEPARTMENT_OTHER): Payer: Self-pay | Admitting: Emergency Medicine

## 2017-09-20 ENCOUNTER — Emergency Department (HOSPITAL_BASED_OUTPATIENT_CLINIC_OR_DEPARTMENT_OTHER): Payer: Managed Care, Other (non HMO)

## 2017-09-20 ENCOUNTER — Emergency Department (HOSPITAL_BASED_OUTPATIENT_CLINIC_OR_DEPARTMENT_OTHER)
Admission: EM | Admit: 2017-09-20 | Discharge: 2017-09-20 | Disposition: A | Discharge status: Home / Self Care | Payer: Managed Care, Other (non HMO) | Attending: Emergency Medicine | Admitting: Emergency Medicine

## 2017-09-20 DIAGNOSIS — Z91040 Latex allergy status: Secondary | ICD-10-CM | POA: Insufficient documentation

## 2017-09-20 DIAGNOSIS — I872 Venous insufficiency (chronic) (peripheral): Secondary | ICD-10-CM | POA: Insufficient documentation

## 2017-09-20 DIAGNOSIS — E119 Type 2 diabetes mellitus without complications: Secondary | ICD-10-CM | POA: Diagnosis not present

## 2017-09-20 DIAGNOSIS — Z7984 Long term (current) use of oral hypoglycemic drugs: Secondary | ICD-10-CM | POA: Insufficient documentation

## 2017-09-20 DIAGNOSIS — R1084 Generalized abdominal pain: Secondary | ICD-10-CM

## 2017-09-20 DIAGNOSIS — Z79899 Other long term (current) drug therapy: Secondary | ICD-10-CM | POA: Insufficient documentation

## 2017-09-20 DIAGNOSIS — R590 Localized enlarged lymph nodes: Secondary | ICD-10-CM | POA: Diagnosis not present

## 2017-09-20 DIAGNOSIS — R110 Nausea: Secondary | ICD-10-CM | POA: Diagnosis not present

## 2017-09-20 DIAGNOSIS — R600 Localized edema: Secondary | ICD-10-CM | POA: Diagnosis not present

## 2017-09-20 HISTORY — DX: Type 2 diabetes mellitus without complications: E11.9

## 2017-09-20 LAB — URINALYSIS, ROUTINE W REFLEX MICROSCOPIC
Bilirubin Urine: NEGATIVE
Glucose, UA: NEGATIVE mg/dL
Hgb urine dipstick: NEGATIVE
Ketones, ur: NEGATIVE mg/dL
LEUKOCYTES UA: NEGATIVE
NITRITE: NEGATIVE
PH: 6 (ref 5.0–8.0)
Protein, ur: NEGATIVE mg/dL
SPECIFIC GRAVITY, URINE: 1.01 (ref 1.005–1.030)

## 2017-09-20 LAB — COMPREHENSIVE METABOLIC PANEL
ALBUMIN: 3.8 g/dL (ref 3.5–5.0)
ALT: 36 U/L (ref 0–44)
ANION GAP: 8 (ref 5–15)
AST: 23 U/L (ref 15–41)
Alkaline Phosphatase: 75 U/L (ref 38–126)
BUN: 12 mg/dL (ref 6–20)
CHLORIDE: 108 mmol/L (ref 98–111)
CO2: 23 mmol/L (ref 22–32)
Calcium: 8.7 mg/dL — ABNORMAL LOW (ref 8.9–10.3)
Creatinine, Ser: 0.84 mg/dL (ref 0.61–1.24)
GFR calc Af Amer: >60 mL/min (ref >60)
GFR calc non Af Amer: >60 mL/min (ref >60)
GLUCOSE: 128 mg/dL — AB (ref 70–99)
POTASSIUM: 3.8 mmol/L (ref 3.5–5.1)
SODIUM: 139 mmol/L (ref 135–145)
Total Bilirubin: 0.3 mg/dL (ref 0.3–1.2)
Total Protein: 6.4 g/dL — ABNORMAL LOW (ref 6.5–8.1)

## 2017-09-20 LAB — CBC WITH DIFFERENTIAL/PLATELET
BASOS ABS: 0.1 10*3/uL (ref 0.0–0.1)
Basophils Relative: 1 %
EOS ABS: 0.1 10*3/uL (ref 0.0–0.7)
Eosinophils Relative: 1 %
HCT: 35.6 % — ABNORMAL LOW (ref 39.0–52.0)
HEMOGLOBIN: 12 g/dL — AB (ref 13.0–17.0)
LYMPHS PCT: 29 %
Lymphs Abs: 1.8 10*3/uL (ref 0.7–4.0)
MCH: 27 pg (ref 26.0–34.0)
MCHC: 33.7 g/dL (ref 30.0–36.0)
MCV: 80.2 fL (ref 78.0–100.0)
MONO ABS: 0.4 10*3/uL (ref 0.1–1.0)
Monocytes Relative: 6 %
NEUTROS ABS: 3.8 10*3/uL (ref 1.7–7.7)
Neutrophils Relative %: 63 %
PLATELETS: 170 10*3/uL (ref 150–400)
RBC: 4.44 MIL/uL (ref 4.22–5.81)
RDW: 13.9 % (ref 11.5–15.5)
WBC: 6.2 10*3/uL (ref 4.0–10.5)

## 2017-09-20 LAB — LIPASE, BLOOD: Lipase: 28 U/L (ref 11–51)

## 2017-09-20 MED ORDER — SODIUM CHLORIDE 0.9 % IV SOLN
Freq: Once | INTRAVENOUS | Status: AC
  Administered 2017-09-20: 07:00:00 via INTRAVENOUS

## 2017-09-20 MED ORDER — IOPAMIDOL (ISOVUE-300) INJECTION 61%
100.0000 mL | Freq: Once | INTRAVENOUS | Status: AC | PRN
  Administered 2017-09-20: 100 mL via INTRAVENOUS

## 2017-09-20 MED ORDER — FENTANYL CITRATE (PF) 100 MCG/2ML IJ SOLN
100.0000 ug | INTRAMUSCULAR | Status: DC | PRN
  Administered 2017-09-20: 100 ug via INTRAVENOUS
  Filled 2017-09-20: qty 2

## 2017-09-20 MED ORDER — ONDANSETRON HCL 4 MG/2ML IJ SOLN
4.0000 mg | Freq: Once | INTRAMUSCULAR | Status: AC
  Administered 2017-09-20: 4 mg via INTRAVENOUS
  Filled 2017-09-20: qty 2

## 2017-09-20 NOTE — ED Provider Notes (Signed)
[facility] Provider Note: Georgena Spurling, MD, FACEP  CSN: [id number] MRN: [id number] ARRIVAL: 09/20/17 at [place]: MHOTF/OTF   CHIEF COMPLAINT  Abdominal Pain   HISTORY OF PRESENT ILLNESS  09/20/17 6:20 AM Julian Washington is a 37 y.o. male who developed diarrhea about a week ago.  He was placed on Flagyl for possible infectious etiology.  The diarrhea resolved about 2 days ago.  He had no fever or abdominal pain with the diarrhea.  He is here now because of severe abdominal pain that developed when he was driving to work this morning about 2 hours ago.  The pain is diffuse and has both sharp and dull components.  It was radiating into his back.  It is somewhat better now but still worse with movement or palpation.  He had some associated nausea but no vomiting.  He was afebrile on arrival.  He has chronic lower extremity edema and stasis dermatitis.  He usually takes Lasix for the edema.   Past Medical History:  Diagnosis Date  . Anemia   . Anxiety and depression    h/o suicidality w/ citalopram  . Back pain   . Chest pain   . Diabetes mellitus without complication ([place])   . Difficult intubation   . ETD (eustachian tube dysfunction)    s/p ENT, declined ear tuves before   . GERD (gastroesophageal reflux disease)   . History of chicken pox    Titered on 08/16/2005  . Hypogonadism male    Dr Cruzita Lederer, used to see urology  . Infertility male   . Influenza 02/2016  . Internal hemorrhoids    s/p banding  . Low testosterone   . Migraine    on topamax  . OSA on CPAP   . Palpitations   . 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 Prediabetes    A1C 6.2 years ago  . 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 Vitamin B 12 deficiency    h/o  . Vitamin D deficiency     Past Surgical History:  Procedure Laterality Date  . 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 TONSILLECTOMY AND  ADENOIDECTOMY      Family History  Problem Relation Age of Onset  . Obesity Mother   . Depression Father   . Anxiety disorder Father   . Bipolar disorder Father   . Drug abuse Father   . Diabetes Paternal Grandmother   . Hypertension Paternal Grandmother   . Diabetes Paternal Grandfather   . Prostate cancer Other        GF  . CAD Other        PGF  . Colon cancer Neg Hx     Social History   Tobacco Use  . Smoking status: Never Smoker  . Smokeless tobacco: Never Used  Substance Use Topics  . Alcohol use: Yes    Alcohol/week: 0.0 standard drinks    Comment: occ  . Drug use: No    Prior to Admission medications   Medication Sig Start Date End Date Taking? Authorizing Provider  ACCU-CHEK FASTCLIX LANCETS MISC 1 Package by Does not apply route 2 (two) times daily. 04/23/17   Waldon Merl, PA-C  B Complex Vitamins (B COMPLEX-B12 PO) Take 1 tablet by mouth daily.     [provider]  buPROPion (WELLBUTRIN SR) 150 MG 12 hr tablet Take 1 tablet (  150 mg total) by mouth daily. Patient not taking: Reported on 09/03/2017 05/29/17   Waldon Merl, PA-C  ferrous sulfate (SLOW RELEASE IRON) 160 (50 Fe) MG TBCR SR tablet Take 1 tablet by mouth daily.    [provider]  fexofenadine (ALLEGRA) 180 MG tablet Take 180 mg by mouth daily.    [provider]  fluticasone (FLONASE) 50 MCG/ACT nasal spray Place 2 sprays into both nostrils daily.    [provider]  furosemide (LASIX) 20 MG tablet Take 1 tablet (20 mg total) by mouth daily. 05/21/17   Colon Branch, MD  glucose blood (ACCU-CHEK GUIDE) test strip 1 each by Other route 2 (two) times daily. Use as instructed 04/23/17   Waldon Merl, PA-C  Insulin Pen Needle (TECHLITE PEN NEEDLES) 32G X 6 MM MISC USE AS DIRECTED TO INJECT VICTOZA Patient not taking: Reported on 09/03/2017 03/06/17   Dennard Nip D, MD  Magnesium Oxide (MAG-OXIDE PO) Take 500 mg by mouth daily.    [provider]  metFORMIN  (GLUMETZA) 1000 MG (MOD) 24 hr tablet Take 2 tablets (2,000 mg total) by mouth daily. 08/06/17   Renato Shin, MD  Multiple Vitamin (MULTIVITAMIN) tablet Take 1 tablet by mouth daily.     [provider]  pantoprazole (PROTONIX) 40 MG tablet Take 1 tablet (40 mg total) by mouth daily. 05/21/17   Colon Branch, MD  verapamil (CALAN-SR) 240 MG CR tablet Take 1 tablet (240 mg total) by mouth at bedtime. 08/04/17   Colon Branch, MD  Vitamin D, Ergocalciferol, (DRISDOL) 50000 units CAPS capsule Take 1 capsule (50,000 Units total) by mouth every 7 (seven) days. 05/28/17   Waldon Merl, PA-C    Allergies Levaquin [levofloxacin in d5w]; Celexa [citalopram]; Dextrans; Hydrocodone-acetaminophen; Latex; and Oxycodone   REVIEW OF SYSTEMS  Negative except as noted here or in the History of Present Illness.   PHYSICAL EXAMINATION  Initial Vital Signs Blood pressure (!) 172/82, pulse 97, temperature 97.8 F (36.6 C), temperature source Oral, resp. rate (!) 22, height 5\' 11"  (1.803 m), weight (!) 173.7 kg, SpO2 100 %.  Examination General: Well-developed, obese male in no acute distress; appearance consistent with age of record HENT: normocephalic; atraumatic Eyes: pupils equal, round and reactive to light; extraocular muscles intact Neck: supple Heart: regular rate and rhythm Lungs: clear to auscultation bilaterally Abdomen: soft; obese; diffusely tender; bowel sounds present Extremities: No deformity; full range of motion; 2+ edema of lower legs Neurologic: Awake, alert and oriented; motor function intact in all extremities and symmetric; no facial droop Skin: Warm and dry; chronic appearing dermatitis of lower legs Psychiatric: Normal mood and affect   RESULTS  Summary of this visit's results, reviewed by myself:   EKG Interpretation  Date/Time:    Ventricular Rate:    PR Interval:    QRS Duration:   QT Interval:    QTC Calculation:   R Axis:     Text Interpretation:         Laboratory Studies: Results for orders placed or performed during the hospital encounter of 09/20/17 (from the past 24 hour(s))  CBC with Differential/Platelet     Status: Abnormal   Collection Time: 09/20/17  6:53 AM  Result Value Ref Range   WBC 6.2 4.0 - 10.5 K/uL   RBC 4.44 4.22 - 5.81 MIL/uL   Hemoglobin 12.0 (L) 13.0 - 17.0 g/dL   HCT 35.6 (L) 39.0 - 52.0 %   MCV 80.2 78.0 -  100.0 fL   MCH 27.0 26.0 - 34.0 pg   MCHC 33.7 30.0 - 36.0 g/dL   RDW 13.9 11.5 - 15.5 %   Platelets 170 150 - 400 K/uL   Neutrophils Relative % 63 %   Lymphocytes Relative 29 %   Monocytes Relative 6 %   Eosinophils Relative 1 %   Basophils Relative 1 %   Neutro Abs 3.8 1.7 - 7.7 K/uL   Lymphs Abs 1.8 0.7 - 4.0 K/uL   Monocytes Absolute 0.4 0.1 - 1.0 K/uL   Eosinophils Absolute 0.1 0.0 - 0.7 K/uL   Basophils Absolute 0.1 0.0 - 0.1 K/uL   Smear Review MORPHOLOGY UNREMARKABLE   Comprehensive metabolic panel     Status: Abnormal   Collection Time: 09/20/17  6:53 AM  Result Value Ref Range   Sodium 139 135 - 145 mmol/L   Potassium 3.8 3.5 - 5.1 mmol/L   Chloride 108 98 - 111 mmol/L   CO2 23 22 - 32 mmol/L   Glucose, Bld 128 (H) 70 - 99 mg/dL   BUN 12 6 - 20 mg/dL   Creatinine, Ser 0.84 0.61 - 1.24 mg/dL   Calcium 8.7 (L) 8.9 - 10.3 mg/dL   Total Protein 6.4 (L) 6.5 - 8.1 g/dL   Albumin 3.8 3.5 - 5.0 g/dL   AST 23 15 - 41 U/L   ALT 36 0 - 44 U/L   Alkaline Phosphatase 75 38 - 126 U/L   Total Bilirubin 0.3 0.3 - 1.2 mg/dL   GFR calc non Af Amer >60 >60 mL/min   GFR calc Af Amer >60 >60 mL/min   Anion gap 8 5 - 15  Lipase, blood     Status: None   Collection Time: 09/20/17  6:53 AM  Result Value Ref Range   Lipase 28 11 - 51 U/L  Urinalysis, Routine w reflex microscopic     Status: None   Collection Time: 09/20/17  7:30 AM  Result Value Ref Range   Color, Urine YELLOW YELLOW   APPearance CLEAR CLEAR   Specific Gravity, Urine 1.010 1.005 - 1.030   pH 6.0 5.0 - 8.0   Glucose, UA NEGATIVE  NEGATIVE mg/dL   Hgb urine dipstick NEGATIVE NEGATIVE   Bilirubin Urine NEGATIVE NEGATIVE   Ketones, ur NEGATIVE NEGATIVE mg/dL   Protein, ur NEGATIVE NEGATIVE mg/dL   Nitrite NEGATIVE NEGATIVE   Leukocytes, UA NEGATIVE NEGATIVE   Imaging Studies: Ct Abdomen Pelvis W Contrast  Result Date: 09/20/2017 CLINICAL DATA:  Abdominal pain. No nausea, vomiting or diarrhea. Recent use of Flagyl. EXAM: CT ABDOMEN AND PELVIS WITH CONTRAST TECHNIQUE: Multidetector CT imaging of the abdomen and pelvis was performed using the standard protocol following bolus administration of intravenous contrast. CONTRAST:  141mL ISOVUE-300 IOPAMIDOL (ISOVUE-300) INJECTION 61% COMPARISON:  04/15/2010 FINDINGS: Lower chest: No acute abnormality. Hepatobiliary: No focal hepatic mass. Diffuse low attenuation of the liver as can be seen with hepatic steatosis. Normal gallbladder. No intrahepatic or extrahepatic biliary ductal dilatation. Pancreas: Unremarkable. No pancreatic ductal dilatation or surrounding inflammatory changes. Spleen: Splenomegaly measuring 17 cm in length. No focal splenic mass. Adrenals/Urinary Tract: Adrenal glands are unremarkable. Kidneys are normal, without renal calculi, focal lesion, or hydronephrosis. Bladder is decompressed. Stomach/Bowel: Stomach is within normal limits. Appendix appears normal. No evidence of bowel wall thickening, distention, or inflammatory changes. No pneumatosis, pneumoperitoneum or portal venous gas. Generalized haziness within the mesentery with scattered small lymph nodes throughout the mesentery with the largest measuring 12 mm in  diameter. Vascular/Lymphatic: Normal caliber abdominal aorta. Generalized haziness within the mesentery with scattered small lymph nodes throughout the mesentery with the largest measuring 12 mm in diameter. Reproductive: Prostate is unremarkable. Other: No abdominal wall hernia or abnormality. No abdominopelvic ascites. Musculoskeletal: No acute or  significant osseous findings. IMPRESSION: 1. Generalized haziness within the mesentery with scattered small lymph nodes throughout the mesentery with the largest measuring 12 mm in diameter. Differential considerations include idiopathic etiology versus portal hypertension versus neoplasm (lymphoma) versus mesenteric panniculitis. No evidence of mesenteric venous thrombosis. 2. Hepatic steatosis. 3. Splenomegaly. Electronically Signed   By: Kathreen Devoid   On: 09/20/2017 08:12    ED COURSE and MDM  Nursing notes and initial vitals signs, including pulse oximetry, reviewed.  Vitals:   09/20/17 0615 09/20/17 0616 09/20/17 0855  BP: (!) 172/82 (!) 172/82 131/74  Pulse: 100 97 66  Resp:  (!) 22 20  Temp:  97.8 F (36.6 C)   TempSrc:  Oral   SpO2: 100% 100% 100%  Weight:  (!) 173.7 kg   Height:  5\' 11"  (1.803 m)     PROCEDURES    ED DIAGNOSES     ICD-10-CM   1. Generalized abdominal pain R10.84        Shanon Rosser, MD 09/20/17 2231

## 2017-09-20 NOTE — ED Provider Notes (Signed)
Patient overall feeling better.  Patient not needing pain medication for discharge home.  CT scan with similar findings regarding the mesenteric adenopathy to what was seen in 2012.  But this time there is enlargement of the spleen.  These findings in 2012 and also again today could be suggestive of lymphoma.  So close follow-up with primary care provider will be needed.  Labs without any significant abnormalities.  Patient will be given work note.  He will return for any new or worse symptoms.   Fredia Sorrow, MD 09/20/17 [phone]

## 2017-09-20 NOTE — Discharge Instructions (Signed)
CT scan shows haziness and some enlarged lymph nodes similar to your scan from 2012.  But this time it stating that the spleen is enlarged.  Otherwise no acute abdominal process.  Recommend follow-up with your primary care doctor regarding these findings.  Work note provided.  Return for any new or worse symptoms.  Labs without significant abnormalities today.

## 2017-09-20 NOTE — ED Notes (Signed)
Patient transported to CT 

## 2017-09-20 NOTE — ED Triage Notes (Signed)
Pt c/o 10/10 abd pain radiating to his back for the past 2 hours and diarrhea since last Saturday. Pt on Flagyl for the diarrhea.

## 2017-09-20 NOTE — ED Notes (Signed)
ED Provider at bedside. 

## 2017-09-24 ENCOUNTER — Encounter: Payer: Self-pay | Admitting: Family Medicine

## 2017-09-24 ENCOUNTER — Ambulatory Visit: Payer: Managed Care, Other (non HMO) | Admitting: Family Medicine

## 2017-09-24 VITALS — BP 116/78 | HR 87 | Temp 98.9°F | Ht 71.0 in | Wt 392.2 lb

## 2017-09-24 DIAGNOSIS — R590 Localized enlarged lymph nodes: Secondary | ICD-10-CM | POA: Diagnosis not present

## 2017-09-24 DIAGNOSIS — R161 Splenomegaly, not elsewhere classified: Secondary | ICD-10-CM

## 2017-09-24 LAB — COMPREHENSIVE METABOLIC PANEL
ALK PHOS: 73 U/L (ref 39–117)
ALT: 28 U/L (ref 0–53)
AST: 18 U/L (ref 0–37)
Albumin: 3.9 g/dL (ref 3.5–5.2)
BILIRUBIN TOTAL: 0.3 mg/dL (ref 0.2–1.2)
BUN: 14 mg/dL (ref 6–23)
CO2: 29 meq/L (ref 19–32)
Calcium: 9.3 mg/dL (ref 8.4–10.5)
Chloride: 105 mEq/L (ref 96–112)
Creatinine, Ser: 0.8 mg/dL (ref 0.40–1.50)
GFR: 115.94 mL/min (ref >60.00)
Glucose, Bld: 163 mg/dL — ABNORMAL HIGH (ref 70–99)
Potassium: 4.4 mEq/L (ref 3.5–5.1)
Sodium: 139 mEq/L (ref 135–145)
TOTAL PROTEIN: 6.1 g/dL (ref 6.0–8.3)

## 2017-09-24 NOTE — Progress Notes (Signed)
Chief Complaint  Patient presents with  . Hospitalization Follow-up    abdominal pain    Subjective: Patient is a 37 y.o. male here for ED f/u.  The patient was seen in the emergency department on 09/20/2017 and had a CT abdomen/pelvis performed.  It did show splenomegaly of 17 cm and mesenteric lymphadenopathy.  He has not had any unintentional weight loss outside of a GI related illness and has since gained back his weight after this had resolved.  No family history of malignancy.  No night sweats, fevers, areas of easy bruising/bleeding.  ROS: Heme: No bruising Const: No fevers  Past Medical History:  Diagnosis Date  . Anemia   . Anxiety and depression    h/o suicidality w/ citalopram  . Diabetes mellitus without complication ([place])   . Difficult intubation   . ETD (eustachian tube dysfunction)    s/p ENT, declined ear tuves before   . GERD (gastroesophageal reflux disease)   . History of chicken pox    Titered on 08/16/2005  . Hypogonadism male    Dr Cruzita Lederer, used to see urology  . Infertility male   . Internal hemorrhoids    s/p banding  . Low testosterone   . Migraine    on topamax  . OSA on CPAP   . Palpitations   . 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 Prediabetes    A1C 6.2 years ago  . 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 Vitamin B 12 deficiency    h/o  . Vitamin D deficiency     Objective: BP 116/78 (BP Location: Left Arm, Patient Position: Sitting, Cuff Size: Large)   Pulse 87   Temp 98.9 F (37.2 C) (Oral)   Ht 5\' 11"  (1.803 m)   Wt (!) 392 lb 4 oz (177.9 kg)   SpO2 96%   BMI 54.71 kg/m  General: Awake, appears stated age HEENT: MMM, EOMi Heart: RRR, no murmurs  Lungs: CTAB, no rales, wheezes or rhonchi. No accessory muscle use Abd: Large panniculus makes exam difficult; soft, nondistended, mild epigastric and right upper quadrant tenderness to palpation.  I am unable to  palpate the liver or spleen. Psych: Age appropriate judgment and insight, normal affect and mood  Assessment and Plan: Splenomegaly - Plan: CBC (INCLUDES DIFF/PLT) WITH PATHOLOGIST REVIEW, Comprehensive metabolic panel  Mesenteric lymphadenopathy - Plan: CBC (INCLUDES DIFF/PLT) WITH PATHOLOGIST REVIEW, Comprehensive metabolic panel  Orders as above.  Will likely refer to heme/on pending above. Let us know if he needs anything.  Follow-up as needed. The patient voiced understanding and agreement to the plan.  [place], DO 09/24/17  11:41 AM

## 2017-09-24 NOTE — Patient Instructions (Addendum)
Give me a business week to get the results of your labs back.   We may recommend you see a heme/onc specialist depending on the results of your testing.  Let us know if you need anything.

## 2017-09-24 NOTE — Progress Notes (Signed)
Pre visit review using our clinic review tool, if applicable. No additional management support is needed unless otherwise documented below in the visit note. 

## 2017-09-25 LAB — CBC (INCLUDES DIFF/PLT) WITH PATHOLOGIST REVIEW
BASOS ABS: 39 {cells}/uL (ref 0–200)
BASOS PCT: 0.5 %
Eosinophils Absolute: 92 cells/uL (ref 15–500)
Eosinophils Relative: 1.2 %
HEMATOCRIT: 35.9 % — AB (ref 38.5–50.0)
Hemoglobin: 12 g/dL — ABNORMAL LOW (ref 13.2–17.1)
Lymphs Abs: 2202 cells/uL (ref 850–3900)
MCH: 26.8 pg — ABNORMAL LOW (ref 27.0–33.0)
MCHC: 33.4 g/dL (ref 32.0–36.0)
MCV: 80.3 fL (ref 80.0–100.0)
MPV: 11.1 fL (ref 7.5–12.5)
Monocytes Relative: 4.9 %
Neutro Abs: 4990 cells/uL (ref 1500–7800)
Neutrophils Relative %: 64.8 %
Platelets: 203 10*3/uL (ref 140–400)
RBC: 4.47 10*6/uL (ref 4.20–5.80)
RDW: 13.9 % (ref 11.0–15.0)
TOTAL LYMPHOCYTE: 28.6 %
WBC: 7.7 10*3/uL (ref 3.8–10.8)
WBCMIX: 377 {cells}/uL (ref 200–950)

## 2017-09-26 ENCOUNTER — Other Ambulatory Visit: Payer: Self-pay | Admitting: Family Medicine

## 2017-09-26 DIAGNOSIS — R590 Localized enlarged lymph nodes: Secondary | ICD-10-CM

## 2017-09-26 DIAGNOSIS — R161 Splenomegaly, not elsewhere classified: Secondary | ICD-10-CM

## 2017-09-29 ENCOUNTER — Telehealth: Payer: Self-pay | Admitting: Hematology

## 2017-09-29 NOTE — Telephone Encounter (Signed)
Pt has been called and scheduled to see Dr. Burr Medico on 8/23 at 230pm. Pt agreed to the appt date and time.

## 2017-10-03 ENCOUNTER — Inpatient Hospital Stay: Payer: Managed Care, Other (non HMO) | Attending: Hematology | Admitting: Hematology

## 2017-10-03 ENCOUNTER — Encounter: Payer: Self-pay | Admitting: Hematology

## 2017-10-03 ENCOUNTER — Inpatient Hospital Stay: Payer: Managed Care, Other (non HMO)

## 2017-10-03 VITALS — BP 142/70 | HR 84 | Temp 98.9°F | Resp 18 | Ht 71.0 in | Wt 392.1 lb

## 2017-10-03 DIAGNOSIS — Z8042 Family history of malignant neoplasm of prostate: Secondary | ICD-10-CM | POA: Diagnosis not present

## 2017-10-03 DIAGNOSIS — L409 Psoriasis, unspecified: Secondary | ICD-10-CM | POA: Diagnosis not present

## 2017-10-03 DIAGNOSIS — R161 Splenomegaly, not elsewhere classified: Secondary | ICD-10-CM | POA: Diagnosis not present

## 2017-10-03 DIAGNOSIS — R591 Generalized enlarged lymph nodes: Secondary | ICD-10-CM | POA: Insufficient documentation

## 2017-10-03 DIAGNOSIS — K219 Gastro-esophageal reflux disease without esophagitis: Secondary | ICD-10-CM | POA: Diagnosis not present

## 2017-10-03 DIAGNOSIS — D649 Anemia, unspecified: Secondary | ICD-10-CM | POA: Insufficient documentation

## 2017-10-03 DIAGNOSIS — R590 Localized enlarged lymph nodes: Secondary | ICD-10-CM

## 2017-10-03 DIAGNOSIS — E119 Type 2 diabetes mellitus without complications: Secondary | ICD-10-CM | POA: Insufficient documentation

## 2017-10-03 DIAGNOSIS — Z808 Family history of malignant neoplasm of other organs or systems: Secondary | ICD-10-CM | POA: Diagnosis not present

## 2017-10-03 DIAGNOSIS — D352 Benign neoplasm of pituitary gland: Secondary | ICD-10-CM | POA: Insufficient documentation

## 2017-10-03 DIAGNOSIS — R609 Edema, unspecified: Secondary | ICD-10-CM | POA: Insufficient documentation

## 2017-10-03 LAB — RETICULOCYTES
RBC.: 4.39 MIL/uL (ref 4.20–5.82)
Retic Count, Absolute: 74.6 10*3/uL (ref 34.8–93.9)
Retic Ct Pct: 1.7 % (ref 0.8–1.8)

## 2017-10-03 NOTE — Progress Notes (Signed)
[place]  [phone] Fax:([phone]) [employer] Note   Patient Care Team: Colon Branch, MD as PCP - General (Internal Medicine) Chesley Mires, MD as Consulting Physician (Pulmonary Disease) Lorenza Evangelist, MD as Referring Physician (Urology) Ralene Bathe, MD as Consulting Physician (Ophthalmology) Sarina Ser., MD as Referring Physician (Urology) Philemon Kingdom, MD as Consulting Physician (Endocrinology)   Date of Service:  10/03/2017  REFERRAL PHYSICIAN: Colon Branch, MD  CHIEF COMPLAINTS/PURPOSE OF CONSULTATION:  Mesenteric lymphadenopathy and splenomegaly   HISTORY OF PRESENTING ILLNESS:   Julian Washington 37 y.o. male is a here because of splenomegaly and lymphadenopathy. The patient was referred by Dr. Nani Ravens. The patient presents to the clinic today accompanied by his wife.  He notes the week before 09/20/17 he was sick with diarrhea and nausea. After a week if started to improve. He received prescription through [facility] for Flagyl to help. On 8/10 he was going to work when he had severe abdominal pain while driving. Family took him to ER and this is where he was scanned. His pain subsided the next day. Around this time her started medication for ADHD but stopped due to diarrhea and recent discoveries. He also mentioned having intermittent abdominal right abdominal pain and occasional slight chest discomfort.   Today he notes having gas and loose stool. He note he has abscessed tooth and is on doxycycline. He has chronic LE edema and Psoriasis on b/l extremities. He notes he will notice blood when he wipes after stool occasionally. He thinks this may be possible hemorrhoids.  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  He has DM and stopped Metformin because he ran out recently. He has GERD. He had anemia before and stopped oral iron due to nausea. He had a 1cm Pituitary adenoma removed at  [facility]. He also has low testosterone. He does plan to have children so he is not on replacement.  His maternal grandfather had glioblastoma, paternal grandfather had prostate and skin cancer. His maternal grandmother had precancerous breast lesions. He does not drink or smoke.     MEDICAL HISTORY:  Past Medical History:  Diagnosis Date  . Anemia   . Anxiety and depression    h/o suicidality w/ citalopram  . Diabetes mellitus without complication ([place])   . Difficult intubation   . ETD (eustachian tube dysfunction)    s/p ENT, declined ear tuves before   . GERD (gastroesophageal reflux disease)   . History of chicken pox    Titered on 08/16/2005  . Hypogonadism male    Dr Cruzita Lederer, used to see urology  . Infertility male   . Internal hemorrhoids    s/p banding  . Low testosterone   . Migraine    on topamax  . OSA on CPAP   . Palpitations   . 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[place])    h/o increased prolactin, Dr Cruzita Lederer (previously @ [facility])  . Prediabetes    A1C 6.2 years ago  . 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 Vitamin B 12 deficiency    h/o  . Vitamin D deficiency     SURGICAL HISTORY: Past Surgical History:  Procedure Laterality Date  . 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 TONSILLECTOMY AND ADENOIDECTOMY      SOCIAL HISTORY: Social History   Socioeconomic History  . Marital status: Married    Spouse name:  Not on file  . Number of children: 0  . Years of education: Not on file  . Highest education level: Not on file  Occupational History  . Occupation: Therapist, sports, [employer] short stay    Employer: [employer]  Social Needs  . Financial resource strain: Not on file  . Food insecurity:    Worry: Not on file    Inability: Not on file  . Transportation needs:    Medical: Not on file    Non-medical: Not on file  Tobacco Use  . Smoking status: Never Smoker  . Smokeless tobacco: Never Used  Substance and Sexual Activity  . Alcohol use: Yes     Alcohol/week: 0.0 standard drinks    Comment: occ  . Drug use: No  . Sexual activity: Not on file  Lifestyle  . Physical activity:    Days per week: Not on file    Minutes per session: Not on file  . Stress: Not on file  Relationships  . Social connections:    Talks on phone: Not on file    Gets together: Not on file    Attends religious service: Not on file    Active member of club or organization: Not on file    Attends meetings of clubs or organizations: Not on file    Relationship status: Not on file  . Intimate partner violence:    Fear of current or ex partner: Not on file    Emotionally abused: Not on file    Physically abused: Not on file    Forced sexual activity: Not on file  Other Topics Concern  . Not on file  Social History Narrative   Lives w/ wife    FAMILY HISTORY: Family History  Problem Relation Age of Onset  . Obesity Mother   . Depression Father   . Anxiety disorder Father   . Bipolar disorder Father   . Drug abuse Father   . Cancer Maternal Grandfather        GBM   . Diabetes Paternal Grandmother   . Hypertension Paternal Grandmother   . Diabetes Paternal Grandfather   . Cancer Paternal Grandfather        prostate cancer  . Prostate cancer Other        GF  . CAD Other        PGF  . Colon cancer Neg Hx     ALLERGIES:  is allergic to levaquin [levofloxacin in d5w]; celexa [citalopram]; dextrans; hydrocodone-acetaminophen; latex; and oxycodone.  MEDICATIONS:  Current Outpatient Medications  Medication Sig Dispense Refill  . ACCU-CHEK FASTCLIX LANCETS MISC 1 Package by Does not apply route 2 (two) times daily. 100 each 0  . acetaminophen (TYLENOL) 500 MG tablet Take 1,000 mg by mouth every 6 (six) hours as needed.    . chlorhexidine (PERIDEX) 0.12 % solution Use as directed 15 mLs in the mouth or throat 4 (four) times daily.    . fexofenadine (ALLEGRA) 180 MG tablet Take 180 mg by mouth daily.    . fluticasone (FLONASE) 50 MCG/ACT nasal  spray Place 2 sprays into both nostrils daily.    . furosemide (LASIX) 20 MG tablet Take 1 tablet (20 mg total) by mouth daily. 90 tablet 1  . glucose blood (ACCU-CHEK GUIDE) test strip 1 each by Other route 2 (two) times daily. Use as instructed 100 each 0  . ibuprofen (ADVIL,MOTRIN) 600 MG tablet Take 600 mg by mouth every 6 (six) hours as needed.    Marland Kitchen  lactobacillus acidophilus (BACID) TABS tablet Take 2 tablets by mouth 3 (three) times daily.    . Magnesium Oxide (MAG-OXIDE PO) Take 500 mg by mouth daily.    . metFORMIN (GLUMETZA) 1000 MG (MOD) 24 hr tablet Take 2 tablets (2,000 mg total) by mouth daily. 180 tablet 3  . Multiple Vitamin (MULTIVITAMIN) tablet Take 1 tablet by mouth daily.     . pantoprazole (PROTONIX) 40 MG tablet Take 1 tablet (40 mg total) by mouth daily. 90 tablet 3  . verapamil (CALAN-SR) 240 MG CR tablet Take 1 tablet (240 mg total) by mouth at bedtime. 90 tablet 1  . penicillin v potassium (VEETID) 500 MG tablet     . Vitamin D, Ergocalciferol, (DRISDOL) 50000 units CAPS capsule Take 1 capsule (50,000 Units total) by mouth every 7 (seven) days. 4 capsule 0   No current facility-administered medications for this visit.     REVIEW OF SYSTEMS:   Constitutional: Denies fevers, chills or abnormal night sweats Eyes: Denies blurriness of vision, double vision or watery eyes Ears, nose, mouth, throat, and face: Denies mucositis or sore throat (+) abscessed tooth, on antibiotics Respiratory: Denies cough, dyspnea or wheezes Cardiovascular: Denies palpitation, chest discomfort (+) Moderate B/l lower extremity swelling, chronic Gastrointestinal:  Denies nausea, heartburn or change in bowel habits (+) abdominal gas and slight bloating (+) loose stool  Skin: Denies abnormal skin rashes (+) Psoriasis Lymphatics: Denies new lymphadenopathy or easy bruising Neurological:Denies numbness, tingling or new weaknesses Behavioral/Psych: Mood is stable, no new changes  All other systems  were reviewed with the patient and are negative.  PHYSICAL EXAMINATION: ECOG PERFORMANCE STATUS: 0 - Asymptomatic  Vitals:   10/03/17 1431  BP: (!) 142/70  Pulse: 84  Resp: 18  Temp: 98.9 F (37.2 C)  SpO2: 98%   Filed Weights   10/03/17 1431  Weight: (!) 392 lb 1.6 oz (177.9 kg)    GENERAL:alert, no distress and comfortable SKIN: skin color, texture, turgor are normal, no rashes or significant lesions (+) Psoriasis of extremities  EYES: normal, conjunctiva are pink and non-injected, sclera clear OROPHARYNX:no exudate, no erythema and lips, buccal mucosa, and tongue normal  NECK: supple, thyroid normal size, non-tender, without nodularity LYMPH:  no palpable lymphadenopathy in the cervical, axillary or inguinal LUNGS: clear to auscultation and percussion with normal breathing effort HEART: regular rate & rhythm and no murmurs (+)  Moderate significant lower extremity edema ABDOMEN:abdomen soft, non-tender and normal bowel sounds Musculoskeletal:no cyanosis of digits and no clubbing  PSYCH: alert & oriented x 3 with fluent speech NEURO: no focal motor/sensory deficits  LABORATORY DATA:  I have reviewed the data as listed CBC Latest Ref Rng & Units 09/24/2017 09/20/2017 06/08/2017  WBC 3.8 - 10.8 Thousand/uL 7.7 6.2 9.2  Hemoglobin 13.2 - 17.1 g/dL 12.0(L) 12.0(L) 12.4(L)  Hematocrit 38.5 - 50.0 % 35.9(L) 35.6(L) 37.4(L)  Platelets 140 - 400 Thousand/uL 203 170 153    CMP Latest Ref Rng & Units 09/24/2017 09/20/2017 05/21/2017  Glucose 70 - 99 mg/dL 163(H) 128(H) 116(H)  BUN 6 - 23 mg/dL '14 12 20  ' Creatinine 0.40 - 1.50 mg/dL 0.80 0.84 0.78  Sodium 135 - 145 mEq/L 139 139 139  Potassium 3.5 - 5.1 mEq/L 4.4 3.8 4.2  Chloride 96 - 112 mEq/L 105 108 105  CO2 19 - 32 mEq/L '29 23 26  ' Calcium 8.4 - 10.5 mg/dL 9.3 8.7(L) 9.8  Total Protein 6.0 - 8.3 g/dL 6.1 6.4(L) -  Total Bilirubin 0.2 - 1.2  mg/dL 0.3 0.3 -  Alkaline Phos 39 - 117 U/L 73 75 -  AST 0 - 37 U/L 18 23 -  ALT 0 -  53 U/L 28 36 -     RADIOGRAPHIC STUDIES: I have personally reviewed the radiological images as listed and agreed with the findings in the report. Ct Abdomen Pelvis W Contrast  Result Date: 09/20/2017 CLINICAL DATA:  Abdominal pain. No nausea, vomiting or diarrhea. Recent use of Flagyl. EXAM: CT ABDOMEN AND PELVIS WITH CONTRAST TECHNIQUE: Multidetector CT imaging of the abdomen and pelvis was performed using the standard protocol following bolus administration of intravenous contrast. CONTRAST:  17m ISOVUE-300 IOPAMIDOL (ISOVUE-300) INJECTION 61% COMPARISON:  04/15/2010 FINDINGS: Lower chest: No acute abnormality. Hepatobiliary: No focal hepatic mass. Diffuse low attenuation of the liver as can be seen with hepatic steatosis. Normal gallbladder. No intrahepatic or extrahepatic biliary ductal dilatation. Pancreas: Unremarkable. No pancreatic ductal dilatation or surrounding inflammatory changes. Spleen: Splenomegaly measuring 17 cm in length. No focal splenic mass. Adrenals/Urinary Tract: Adrenal glands are unremarkable. Kidneys are normal, without renal calculi, focal lesion, or hydronephrosis. Bladder is decompressed. Stomach/Bowel: Stomach is within normal limits. Appendix appears normal. No evidence of bowel wall thickening, distention, or inflammatory changes. No pneumatosis, pneumoperitoneum or portal venous gas. Generalized haziness within the mesentery with scattered small lymph nodes throughout the mesentery with the largest measuring 12 mm in diameter. Vascular/Lymphatic: Normal caliber abdominal aorta. Generalized haziness within the mesentery with scattered small lymph nodes throughout the mesentery with the largest measuring 12 mm in diameter. Reproductive: Prostate is unremarkable. Other: No abdominal wall hernia or abnormality. No abdominopelvic ascites. Musculoskeletal: No acute or significant osseous findings. IMPRESSION: 1. Generalized haziness within the mesentery with scattered small  lymph nodes throughout the mesentery with the largest measuring 12 mm in diameter. Differential considerations include idiopathic etiology versus portal hypertension versus neoplasm (lymphoma) versus mesenteric panniculitis. No evidence of mesenteric venous thrombosis. 2. Hepatic steatosis. 3. Splenomegaly. Electronically Signed   By: HKathreen Devoid  On: 09/20/2017 08:12    CT AP W Contrast 09/20/17  IMPRESSION: 1. Generalized haziness within the mesentery with scattered small lymph nodes throughout the mesentery with the largest measuring 12 mm in diameter. Differential considerations include idiopathic etiology versus portal hypertension versus neoplasm (lymphoma) versus mesenteric panniculitis. No evidence of mesenteric venous thrombosis. 2. Hepatic steatosis. 3. Splenomegaly.   ASSESSMENT & PLAN:  Julian PRIMEAUis a 37y.o. Caucasian  male with a history of obesity, anxiety/depression, DM, GERD, low testosterone, and H/o Pituitary tumor s/p resection.   1. Mesenteric lymphadenopathy  -I reviewed his latest CT AP from 09/20/17 and his previous CT AP in 04/2010. His older CT showed enlarged mesenteric lymph nodes with largest 1.7cm at that time. His recent scan showed slightly enlarged mesenteric lymphadenopathy.  He did have an acute illness with diarrhea, nausea, likely colitis, so he is mesenteric adenopathy could be reactive to his acute colitis.  -He does not have palpable peripheral adenopathy on exam, no B symptoms, no other adenopathy on the CT scan, my suspicion for lymphoma is low. - Plan to repeat in 6 months.  -will check LDH as well as MPN gene mutation testing. He is agreeable.  -I do not recommend bone marrow biopsy or further workup at this time. Will observe. I encouraged him to watch for signs of fever, night sweats, pain etc  -F/u in 6 months with a repeated CT abdomen and pelvis with contrast    2. Splenomegaly -His recent  8/10/119 scan also showed 17cm spleen. I  suspect this can be related to his fatty liver. I could not palpable spleen on initial exam. His spleen size was normal on his CT in 2012.  -There is no indication for treatment at this time.  I encouraged him to avoid injury to spleen as there is a risk for rupture due to its enlargement.  -His 2018 lipid panel showed was nearly within normal limits. He has tried to lose weight and has attempted [facility] weight and wellness which did not work for him. He plans to return to weight watchers. He knows his option of bariatric surgery if needed.  -He has normal CBC, except a mild anemia, my suspicion for myeloproliferative neoplasm is low, however given the enlarged spleen without liver cirrhosis, I will check MPN gene panel.  3. 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   -Hg at 12.0 last week. Will do baseline labs and iron panel today. He is agreeable.  -He has stopped oral iron before due to nausea. I discussed iron replacement options of lower dose oral iron, liquid oral iron or multivitamin based on his iron level. I will call him with the result.s.     PLAN:  -Lab today  -F/u in 6 months with lab and CT ABD/PEL with contrast a few before    No orders of the defined types were placed in this encounter.   All questions were answered. The patient knows to call the clinic with any problems, questions or concerns. I spent 35 minutes counseling the patient face to face. The total time spent in the appointment was 45 minutes and more than 50% was on counseling.     Joslyn Devon 10/03/2017 3:16 PM  I, Joslyn Devon, am acting as scribe for Truitt Merle, MD.   I have reviewed the above documentation for accuracy and completeness, and I agree with the above.

## 2017-10-04 ENCOUNTER — Encounter: Payer: Self-pay | Admitting: Hematology

## 2017-10-06 ENCOUNTER — Telehealth: Payer: Self-pay | Admitting: Hematology

## 2017-10-06 LAB — IRON AND TIBC
IRON: 39 ug/dL — AB (ref 42–163)
Saturation Ratios: 13 % — ABNORMAL LOW (ref 42–163)
TIBC: 298 ug/dL (ref 202–409)
UIBC: 258 ug/dL

## 2017-10-06 LAB — FERRITIN: FERRITIN: 95 ng/mL (ref 24–336)

## 2017-10-06 NOTE — Telephone Encounter (Signed)
Patient returned call to schedule LAB Appt , he requested 7:45 am on 8/28 per 8/23 sch msg

## 2017-10-07 ENCOUNTER — Telehealth: Payer: Self-pay

## 2017-10-07 NOTE — Telephone Encounter (Signed)
-----   Message from Truitt Merle, MD sent at 10/07/2017  2:21 PM EDT ----- Please let pt know the lab results, slightly low iron level, I recommend low dose OTC iron pill or MVI with iorn, thanks   Truitt Merle  10/07/2017

## 2017-10-07 NOTE — Telephone Encounter (Signed)
Left voice message for patient, per Dr. Burr Medico lab results showed iron level is slightly low, Dr. Burr Medico is recommending you take an OTC low dose iron supplement or a MVI with iron.

## 2017-10-08 ENCOUNTER — Encounter: Payer: Self-pay | Admitting: Hematology

## 2017-10-08 ENCOUNTER — Inpatient Hospital Stay: Payer: Managed Care, Other (non HMO)

## 2017-11-17 ENCOUNTER — Other Ambulatory Visit: Payer: Self-pay | Admitting: Internal Medicine

## 2018-01-15 ENCOUNTER — Ambulatory Visit: Payer: Self-pay

## 2018-01-15 NOTE — Telephone Encounter (Signed)
Incoming call from Patient stating that  Been experiencing for the past 4days.  Patient states his Dr. Doristine Section him on 20mg  of Lasix . Patient feel his Potassium is low.  Would like an Appoinment  to get labs drawn.  Requested appointment at [facility] @   [place]. Informed Patient that we don't cover that location. Patient voiced understanding.                                                                  Incoming call from Patient with complaint of having severe leg and foot cramps.

## 2018-01-15 NOTE — Telephone Encounter (Signed)
   Reason for Disposition . [1] MODERATE pain (e.g., interferes with normal activities, limping) AND [2] present > 3 days  Answer Assessment - Initial Assessment Questions 1. ONSET: "When did the pain start?"      3 to 4 days 2. LOCATION: "Where is the pain located?"      Legs and abdominal 3. PAIN: "How bad is the pain?"    (Scale 1-10; or mild, moderate, severe)   -  MILD (1-3): doesn't interfere with normal activities    -  MODERATE (4-7): interferes with normal activities (e.g., work or school) or awakens from sleep, limping    -  SEVERE (8-10): excruciating pain, unable to do any normal activities, unable to walk     7 4. 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 CAUSE: "What do you think is causing the leg pain?"     Think it K  Level low  low from  lasix 6. OTHER SYMPTOMS: "Do you have any other symptoms?" (e.g., chest pain, back pain, breathing difficulty, swelling, rash, fever, numbness, weakness)     na 7. PREGNANCY: "Is there any chance you are pregnant?" "When was your last menstrual period?"     na  Protocols used: LEG PAIN-A-AH

## 2018-01-23 ENCOUNTER — Ambulatory Visit: Payer: 59 | Admitting: Internal Medicine

## 2018-01-23 ENCOUNTER — Other Ambulatory Visit: Payer: Self-pay | Admitting: Internal Medicine

## 2018-02-15 ENCOUNTER — Other Ambulatory Visit: Payer: Self-pay | Admitting: Internal Medicine

## 2018-02-18 LAB — HM DIABETES EYE EXAM

## 2018-02-23 ENCOUNTER — Encounter: Payer: Self-pay | Admitting: Internal Medicine

## 2018-02-24 DIAGNOSIS — L409 Psoriasis, unspecified: Secondary | ICD-10-CM | POA: Insufficient documentation

## 2018-02-27 ENCOUNTER — Encounter: Payer: Self-pay | Admitting: Internal Medicine

## 2018-02-27 ENCOUNTER — Other Ambulatory Visit: Payer: Self-pay

## 2018-02-27 ENCOUNTER — Ambulatory Visit: Payer: Managed Care, Other (non HMO) | Admitting: Internal Medicine

## 2018-02-27 DIAGNOSIS — Z87898 Personal history of other specified conditions: Secondary | ICD-10-CM

## 2018-02-27 DIAGNOSIS — R7303 Prediabetes: Secondary | ICD-10-CM | POA: Diagnosis not present

## 2018-02-27 DIAGNOSIS — E230 Hypopituitarism: Secondary | ICD-10-CM

## 2018-02-27 DIAGNOSIS — F909 Attention-deficit hyperactivity disorder, unspecified type: Secondary | ICD-10-CM

## 2018-02-27 DIAGNOSIS — R161 Splenomegaly, not elsewhere classified: Secondary | ICD-10-CM

## 2018-02-27 DIAGNOSIS — I872 Venous insufficiency (chronic) (peripheral): Secondary | ICD-10-CM

## 2018-02-27 LAB — BASIC METABOLIC PANEL
BUN: 18 mg/dL (ref 6–23)
CALCIUM: 9 mg/dL (ref 8.4–10.5)
CO2: 23 mEq/L (ref 19–32)
CREATININE: 0.82 mg/dL (ref 0.40–1.50)
Chloride: 105 mEq/L (ref 96–112)
GFR: 105.77 mL/min (ref >60.00)
GLUCOSE: 159 mg/dL — AB (ref 70–99)
Potassium: 4.1 mEq/L (ref 3.5–5.1)
Sodium: 137 mEq/L (ref 135–145)

## 2018-02-27 LAB — HEMOGLOBIN A1C: Hgb A1c MFr Bld: 6.9 % — ABNORMAL HIGH (ref 4.6–6.5)

## 2018-02-27 NOTE — Patient Instructions (Signed)
GO TO THE LAB : Get the blood work     GO TO THE FRONT DESK Schedule your next appointment for a   physical exam in 6 to 8 months

## 2018-02-27 NOTE — Progress Notes (Signed)
Subjective:    Patient ID: Julian Washington, male    DOB: 05/11/1980, 38 y.o.   MRN: [id number]  DOS:  02/27/2018 Type of visit - description: Routine office visit Since her last visit, has seen hematology and vascular surgery, chart is reviewed.  Wt Readings from Last 3 Encounters:  02/27/18 (!) 401 lb 8 oz (182.1 kg)  10/03/17 (!) 392 lb 1.6 oz (177.9 kg)  09/24/17 (!) 392 lb 4 oz (177.9 kg)     Review of Systems Has not changed his diet habits, plans to do in the near future. Had leg cramps last month, potassium was checked at urgent care and it was normal.   Past Medical History:  Diagnosis Date  . ADHD   . Anemia   . Anxiety and depression    h/o suicidality w/ citalopram  . Diabetes mellitus without complication ([place])   . Difficult intubation   . ETD (eustachian tube dysfunction)    s/p ENT, declined ear tuves before   . GERD (gastroesophageal reflux disease)   . History of chicken pox    Titered on 08/16/2005  . Hypogonadism male    Dr Cruzita Lederer, used to see urology  . Infertility male   . Internal hemorrhoids    s/p banding  . Low testosterone   . Migraine    on topamax  . OSA on CPAP   . Palpitations   . 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 Prediabetes    A1C 6.2 years ago  . 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 Vitamin B 12 deficiency    h/o  . Vitamin D deficiency     Past Surgical History:  Procedure Laterality Date  . 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 TONSILLECTOMY AND ADENOIDECTOMY      Social History   Socioeconomic History  . Marital status: Married    Spouse name: Not on file  . Number of children: 0  . Years of education: Not on file  . Highest education level: Not on file  Occupational History  . Occupation: Therapist, sports, [employer] short stay    Employer: [employer]  Social Needs  . Financial resource strain: Not on file  . Food insecurity:    Worry: Not  on file    Inability: Not on file  . Transportation needs:    Medical: Not on file    Non-medical: Not on file  Tobacco Use  . Smoking status: Never Smoker  . Smokeless tobacco: Never Used  Substance and Sexual Activity  . Alcohol use: Yes    Alcohol/week: 0.0 standard drinks    Comment: occ  . Drug use: No  . Sexual activity: Not on file  Lifestyle  . Physical activity:    Days per week: Not on file    Minutes per session: Not on file  . Stress: Not on file  Relationships  . Social connections:    Talks on phone: Not on file    Gets together: Not on file    Attends religious service: Not on file    Active member of club or organization: Not on file    Attends meetings of clubs or organizations: Not on file    Relationship status: Not on file  . Intimate partner violence:    Fear of current or ex partner: Not on file    Emotionally abused: Not on file  Physically abused: Not on file    Forced sexual activity: Not on file  Other Topics Concern  . Not on file  Social History Narrative   Lives w/ wife      Allergies as of 02/27/2018      Reactions   Levaquin [levofloxacin In D5w] Swelling   Celexa [citalopram] Other (See Comments)   Mental status changes    Dextrans Other (See Comments)   Makes the pt. Drowsy.    Hydrocodone-acetaminophen    REACTION: itching   Latex    Oxycodone Itching      Medication List       Accurate as of February 27, 2018 11:59 PM. Always use your most recent med list.        ACCU-CHEK FASTCLIX LANCETS Misc 1 Package by Does not apply route 2 (two) times daily.   atomoxetine 100 MG capsule Commonly known as:  STRATTERA   CONCERTA 18 MG CR tablet Generic drug:  methylphenidate   fexofenadine 180 MG tablet Commonly known as:  ALLEGRA Take 180 mg by mouth daily.   fluticasone 50 MCG/ACT nasal spray Commonly known as:  FLONASE Place 2 sprays into both nostrils daily.   furosemide 20 MG tablet Commonly known as:   LASIX Take 1 tablet (20 mg total) by mouth daily.   glucose blood test strip Commonly known as:  ACCU-CHEK GUIDE 1 each by Other route 2 (two) times daily. Use as instructed   MAG-OXIDE PO Take 500 mg by mouth daily.   metFORMIN 1000 MG (MOD) 24 hr tablet Commonly known as:  GLUMETZA Take 2 tablets (2,000 mg total) by mouth daily.   multivitamin tablet Take 1 tablet by mouth daily.   pantoprazole 40 MG tablet Commonly known as:  PROTONIX Take 1 tablet (40 mg total) by mouth daily.   verapamil 240 MG CR tablet Commonly known as:  CALAN-SR Take 1 tablet (240 mg total) by mouth at bedtime.           Objective:   Physical Exam BP 130/78   Pulse 83   Temp 98.1 F (36.7 C) (Oral)   Resp 16   Ht 5\' 11"  (1.803 m)   Wt (!) 401 lb 8 oz (182.1 kg)   SpO2 98%   BMI 56.00 kg/m  General:   Well developed, NAD, BMI noted. HEENT:  Normocephalic . Face symmetric, atraumatic Lungs:  CTA B Normal respiratory effort, no intercostal retractions, no accessory muscle use. Heart: RRR,  no murmur.  Lower extremities: See pictures, this is his baseline Neurologic:  alert & oriented X3.  Speech normal, gait limited by BMI Psych--  Cognition and judgment appear intact.  Cooperative with normal attention span and concentration.  Behavior appropriate. No anxious or depressed appearing.          Assessment     Assessment   Prediabetes-- on metformin x years  PSYCH: --Anxiety depression (suicidality w/  Citalopram) --ADHD Dx 2019, Dr Johnnye Sima Morbid obesity Vitamin D and  B12 deficiency Endocrinology:Dr Gherghe --Pituitary mass, surgery : Prolactinoma --Hypogonadism hypogonadotropic --Infertility Migraines, d/c Topamax 05-2015 (pt concerned about short term memory), started verapamil OSA on CPAP -- DR Halford Chessman Hematology -Mild chronic anemia, no previous colonoscopy or EGD. No symptoms. Iron , vitamins normal.  saw hematology 09/2017  -Splenomegaly: Per CT 09/20/2017, saw  hematology H/o ET  dysfunction, declined ear tubes H/o hemorrhoids s/p  banding H/o abd pain 2012 (w/u CT, [place], a HIDA scan), similar sx 02-2015 H/o  difficulty intubation  PLAN: Prediabetes: Not doing any particular diet, saw the wellness clinic and it was not a good match for him.  We will check a A1c, BMP.  Plans to start a weight watchers program.  Not on metformin currently but plans to restart. Morbid obesity: As above Pituitary mass, hypogonadism: Plans to see endo in the near future Splenomegaly, mesenteric lymphadenopathy: Saw hematology 10/03/2017, no lymphoma suspected at the time.  They recommend a follow-up in 6 months.  Ferritin was normal, iron slightly low. ADHD: Since the last office visit here, he was diagnosed with ADHD, on the Strattera and Concerta Chronic venous insufficiency, lymphedema: Seen by Dr. Scot Dock, ultrasound performed, current plan is leg elevation.  Today he is at baseline, see pictures. Preventive care: Had a flu shot at work RTC 6 to 8 months, CPX

## 2018-02-28 DIAGNOSIS — I872 Venous insufficiency (chronic) (peripheral): Secondary | ICD-10-CM | POA: Insufficient documentation

## 2018-02-28 DIAGNOSIS — F909 Attention-deficit hyperactivity disorder, unspecified type: Secondary | ICD-10-CM | POA: Insufficient documentation

## 2018-02-28 NOTE — Assessment & Plan Note (Signed)
Prediabetes: Not doing any particular diet, saw the wellness clinic and it was not a good match for him.  We will check a A1c, BMP.  Plans to start a weight watchers program.  Not on metformin currently but plans to restart. Morbid obesity: As above Pituitary mass, hypogonadism: Plans to see endo in the near future Splenomegaly, mesenteric lymphadenopathy: Saw hematology 10/03/2017, no lymphoma suspected at the time.  They recommend a follow-up in 6 months.  Ferritin was normal, iron slightly low. ADHD: Since the last office visit here, he was diagnosed with ADHD, on the Strattera and Concerta Chronic venous insufficiency, lymphedema: Seen by Dr. Scot Dock, ultrasound performed, current plan is leg elevation.  Today he is at baseline, see pictures. Preventive care: Had a flu shot at work RTC 6 to 8 months, CPX

## 2018-03-27 ENCOUNTER — Telehealth (HOSPITAL_COMMUNITY): Payer: Self-pay

## 2018-03-30 ENCOUNTER — Telehealth: Payer: Self-pay | Admitting: Hematology

## 2018-03-30 NOTE — Telephone Encounter (Signed)
R/s appt per 2/17 sch message - left message for patient with appt date and time   

## 2018-04-07 ENCOUNTER — Other Ambulatory Visit: Payer: Self-pay

## 2018-04-07 DIAGNOSIS — D649 Anemia, unspecified: Secondary | ICD-10-CM

## 2018-04-07 DIAGNOSIS — R590 Localized enlarged lymph nodes: Secondary | ICD-10-CM

## 2018-04-08 ENCOUNTER — Other Ambulatory Visit: Payer: Managed Care, Other (non HMO)

## 2018-04-08 ENCOUNTER — Inpatient Hospital Stay: Payer: Managed Care, Other (non HMO)

## 2018-04-08 ENCOUNTER — Other Ambulatory Visit: Payer: Self-pay

## 2018-04-08 ENCOUNTER — Ambulatory Visit: Payer: Managed Care, Other (non HMO)

## 2018-04-08 ENCOUNTER — Ambulatory Visit (HOSPITAL_COMMUNITY)
Admission: RE | Admit: 2018-04-08 | Discharge: 2018-04-08 | Disposition: A | Discharge status: Home / Self Care | Payer: Managed Care, Other (non HMO) | Source: Ambulatory Visit | Attending: Hematology | Admitting: Hematology

## 2018-04-08 ENCOUNTER — Inpatient Hospital Stay: Payer: Managed Care, Other (non HMO) | Attending: Hematology

## 2018-04-08 VITALS — BP 143/77 | HR 77 | Temp 98.3°F | Resp 20 | Ht 71.0 in | Wt 391.7 lb

## 2018-04-08 DIAGNOSIS — R161 Splenomegaly, not elsewhere classified: Secondary | ICD-10-CM | POA: Insufficient documentation

## 2018-04-08 DIAGNOSIS — I878 Other specified disorders of veins: Secondary | ICD-10-CM

## 2018-04-08 DIAGNOSIS — R918 Other nonspecific abnormal finding of lung field: Secondary | ICD-10-CM | POA: Diagnosis not present

## 2018-04-08 DIAGNOSIS — E669 Obesity, unspecified: Secondary | ICD-10-CM | POA: Diagnosis not present

## 2018-04-08 DIAGNOSIS — E291 Testicular hypofunction: Secondary | ICD-10-CM | POA: Insufficient documentation

## 2018-04-08 DIAGNOSIS — D649 Anemia, unspecified: Secondary | ICD-10-CM

## 2018-04-08 DIAGNOSIS — D509 Iron deficiency anemia, unspecified: Secondary | ICD-10-CM | POA: Insufficient documentation

## 2018-04-08 DIAGNOSIS — R590 Localized enlarged lymph nodes: Secondary | ICD-10-CM | POA: Insufficient documentation

## 2018-04-08 LAB — CBC WITH DIFFERENTIAL (CANCER CENTER ONLY)
Abs Immature Granulocytes: 0.07 K/uL (ref 0.00–0.07)
Basophils Absolute: 0 K/uL (ref 0.0–0.1)
Basophils Relative: 0 %
Eosinophils Absolute: 0.1 K/uL (ref 0.0–0.5)
Eosinophils Relative: 1 %
HCT: 37.9 % — ABNORMAL LOW (ref 39.0–52.0)
Hemoglobin: 12.4 g/dL — ABNORMAL LOW (ref 13.0–17.0)
Immature Granulocytes: 1 %
Lymphocytes Relative: 29 %
Lymphs Abs: 2.6 K/uL (ref 0.7–4.0)
MCH: 26.4 pg (ref 26.0–34.0)
MCHC: 32.7 g/dL (ref 30.0–36.0)
MCV: 80.8 fL (ref 80.0–100.0)
Monocytes Absolute: 0.5 K/uL (ref 0.1–1.0)
Monocytes Relative: 5 %
Neutro Abs: 5.8 K/uL (ref 1.7–7.7)
Neutrophils Relative %: 64 %
Platelet Count: 167 K/uL (ref 150–400)
RBC: 4.69 MIL/uL (ref 4.22–5.81)
RDW: 13.3 % (ref 11.5–15.5)
WBC Count: 9.1 K/uL (ref 4.0–10.5)
nRBC: 0 % (ref 0.0–0.2)

## 2018-04-08 LAB — CMP (CANCER CENTER ONLY)
ALBUMIN: 3.9 g/dL (ref 3.5–5.0)
ALK PHOS: 86 U/L (ref 38–126)
ALT: 19 U/L (ref 0–44)
ANION GAP: 9 (ref 5–15)
AST: 12 U/L — ABNORMAL LOW (ref 15–41)
BUN: 15 mg/dL (ref 6–20)
CALCIUM: 8.7 mg/dL — AB (ref 8.9–10.3)
CHLORIDE: 106 mmol/L (ref 98–111)
CO2: 24 mmol/L (ref 22–32)
Creatinine: 0.8 mg/dL (ref 0.61–1.24)
GFR, Est AFR Am: >60 mL/min (ref >60)
GFR, Estimated: >60 mL/min (ref >60)
GLUCOSE: 95 mg/dL (ref 70–99)
Potassium: 4.3 mmol/L (ref 3.5–5.1)
SODIUM: 139 mmol/L (ref 135–145)
Total Bilirubin: 0.4 mg/dL (ref 0.3–1.2)
Total Protein: 6.8 g/dL (ref 6.5–8.1)

## 2018-04-08 LAB — RETICULOCYTES
IMMATURE RETIC FRACT: 15.8 % (ref 2.3–15.9)
RBC.: 4.69 MIL/uL (ref 4.22–5.81)
Retic Count, Absolute: 53.5 10*3/uL (ref 19.0–186.0)
Retic Ct Pct: 1.1 % (ref 0.4–3.1)

## 2018-04-08 LAB — IRON AND TIBC
Iron: 28 ug/dL — ABNORMAL LOW (ref 42–163)
Saturation Ratios: 10 % — ABNORMAL LOW (ref 20–55)
TIBC: 284 ug/dL (ref 202–409)
UIBC: 256 ug/dL (ref 117–376)

## 2018-04-08 LAB — FERRITIN: Ferritin: 137 ng/mL (ref 24–336)

## 2018-04-08 MED ORDER — IOHEXOL 300 MG/ML  SOLN
100.0000 mL | Freq: Once | INTRAMUSCULAR | Status: AC | PRN
  Administered 2018-04-08: 100 mL via INTRAVENOUS

## 2018-04-08 MED ORDER — SODIUM CHLORIDE (PF) 0.9 % IJ SOLN
INTRAMUSCULAR | Status: AC
  Filled 2018-04-08: qty 50

## 2018-04-08 NOTE — Progress Notes (Unsigned)
Poor PIV access- iv team consulted to start IV with ultrsound.

## 2018-04-08 NOTE — Progress Notes (Signed)
[facility]   [phone] [phone]   Clinic Follow up Note   Patient Care Team: Colon Branch, MD as PCP - General (Internal Medicine) Chesley Mires, MD as Consulting Physician (Pulmonary Disease) Lorenza Evangelist, MD as Referring Physician (Urology) Ralene Bathe, MD as Consulting Physician (Ophthalmology) Sarina Ser., MD as Referring Physician (Urology) Philemon Kingdom, MD as Consulting Physician (Endocrinology)  Date of Service:  04/10/2018  CHIEF COMPLAINT: F/u of Mesenteric lymphadenopathy and splenomegaly     CURRENT THERAPY:  Observation   INTERVAL HISTORY:  Julian Washington is here for a follow up of lymphadenopathy. He presents to the clinic today by himself. He notes he is doing well overall and doing better since his Sinus infection. He went to ED yesterday and was given antibiotics. He denies fever or night sweats.    REVIEW OF SYSTEMS:   Constitutional: Denies fevers, chills or abnormal weight loss Eyes: Denies blurriness of vision Ears, nose, mouth, throat, and face: Denies mucositis or sore throat (+) Sinus infection, improving Respiratory: Denies cough, dyspnea or wheezes Cardiovascular: Denies palpitation, chest discomfort or lower extremity swelling Gastrointestinal:  Denies nausea, heartburn or change in bowel habits Skin: Denies abnormal skin rashes Lymphatics: Denies new lymphadenopathy or easy bruising Neurological:Denies numbness, tingling or new weaknesses Behavioral/Psych: Mood is stable, no new changes  All other systems were reviewed with the patient and are negative.  MEDICAL HISTORY:  Past Medical History:  Diagnosis Date  . ADHD   . Anemia   . Anxiety and depression    h/o suicidality w/ citalopram  . Diabetes mellitus without complication ([place])   . Difficult intubation   . ETD (eustachian tube dysfunction)    s/p ENT, declined ear tuves before   . GERD (gastroesophageal reflux disease)   .  History of chicken pox    Titered on 08/16/2005  . Hypogonadism male    Dr Cruzita Lederer, used to see urology  . Infertility male   . Internal hemorrhoids    s/p banding  . Low testosterone   . Migraine    on topamax  . OSA on CPAP   . Palpitations   . 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 Prediabetes    A1C 6.2 years ago  . 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 Vitamin B 12 deficiency    h/o  . Vitamin D deficiency     SURGICAL HISTORY: Past Surgical History:  Procedure Laterality Date  . 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 TONSILLECTOMY AND ADENOIDECTOMY      I have reviewed the social history and family history with the patient and they are unchanged from previous note.  ALLERGIES:  is allergic to levaquin [levofloxacin in d5w]; celexa [citalopram]; dextrans; hydrocodone-acetaminophen; latex; and oxycodone.  MEDICATIONS:  Current Outpatient Medications  Medication Sig Dispense Refill  . ACCU-CHEK FASTCLIX LANCETS MISC 1 Package by Does not apply route 2 (two) times daily. 100 each 0  . amoxicillin-clavulanate (AUGMENTIN) 875-125 MG tablet Take 1 tablet by mouth 2 (two) times daily. One po bid x 7 days 14 tablet 0  . atomoxetine (STRATTERA) 100 MG capsule     . CONCERTA 18 MG CR tablet     . fexofenadine (ALLEGRA) 180 MG tablet Take 180 mg by mouth daily.    . fluticasone (FLONASE) 50 MCG/ACT nasal spray Place 2 sprays into both nostrils  daily.    . furosemide (LASIX) 20 MG tablet Take 1 tablet (20 mg total) by mouth daily. 30 tablet 0  . glucose blood (ACCU-CHEK GUIDE) test strip 1 each by Other route 2 (two) times daily. Use as instructed 100 each 0  . Magnesium Oxide (MAG-OXIDE PO) Take 500 mg by mouth daily.    . metFORMIN (GLUMETZA) 1000 MG (MOD) 24 hr tablet Take 2 tablets (2,000 mg total) by mouth daily. 180 tablet 3  . Multiple Vitamin (MULTIVITAMIN) tablet Take 1 tablet by mouth  daily.     . pantoprazole (PROTONIX) 40 MG tablet Take 1 tablet (40 mg total) by mouth daily. 90 tablet 3  . predniSONE (DELTASONE) 20 MG tablet 3 tabs po day one, then 2 po daily x 4 days 11 tablet 0  . verapamil (CALAN-SR) 240 MG CR tablet Take 1 tablet (240 mg total) by mouth at bedtime. 90 tablet 0   No current facility-administered medications for this visit.     PHYSICAL EXAMINATION: ECOG PERFORMANCE STATUS: 1 - Symptomatic but completely ambulatory  Vitals:   04/10/18 0806  BP: 136/75  Pulse: 73  Resp: 17  Temp: 97.6 F (36.4 C)  SpO2: 99%   Filed Weights   04/10/18 0806  Weight: (!) 392 lb 12.8 oz (178.2 kg)    GENERAL:alert, no distress and comfortable SKIN: skin color, texture, turgor are normal, no rashes or significant lesions EYES: normal, Conjunctiva are pink and non-injected, sclera clear OROPHARYNX:no exudate, no erythema and lips, buccal mucosa, and tongue normal  NECK: supple, thyroid normal size, non-tender, without nodularity LYMPH:  no palpable lymphadenopathy in the cervical, axillary or inguinal LUNGS: clear to auscultation and percussion with normal breathing effort HEART: regular rate & rhythm and no murmurs and no lower extremity edema ABDOMEN:abdomen soft, non-tender and normal bowel sounds Musculoskeletal:no cyanosis of digits and no clubbing  NEURO: alert & oriented x 3 with fluent speech, no focal motor/sensory deficits  LABORATORY DATA:  I have reviewed the data as listed CBC Latest Ref Rng & Units 04/08/2018 09/24/2017 09/20/2017  WBC 4.0 - 10.5 K/uL 9.1 7.7 6.2  Hemoglobin 13.0 - 17.0 g/dL 12.4(L) 12.0(L) 12.0(L)  Hematocrit 39.0 - 52.0 % 37.9(L) 35.9(L) 35.6(L)  Platelets 150 - 400 K/uL 167 203 170     CMP Latest Ref Rng & Units 04/08/2018 02/27/2018 09/24/2017  Glucose 70 - 99 mg/dL 95 159(H) 163(H)  BUN 6 - 20 mg/dL 15 18 14   Creatinine 0.61 - 1.24 mg/dL 0.80 0.82 0.80  Sodium 135 - 145 mmol/L 139 137 139  Potassium 3.5 - 5.1 mmol/L  4.3 4.1 4.4  Chloride 98 - 111 mmol/L 106 105 105  CO2 22 - 32 mmol/L 24 23 29   Calcium 8.9 - 10.3 mg/dL 8.7(L) 9.0 9.3  Total Protein 6.5 - 8.1 g/dL 6.8 - 6.1  Total Bilirubin 0.3 - 1.2 mg/dL 0.4 - 0.3  Alkaline Phos 38 - 126 U/L 86 - 73  AST 15 - 41 U/L 12(L) - 18  ALT 0 - 44 U/L 19 - 28      RADIOGRAPHIC STUDIES: I have personally reviewed the radiological images as listed and agreed with the findings in the report. Ct Abdomen Pelvis W Contrast  Result Date: 04/09/2018 CLINICAL DATA:  Splenomegaly and adenopathy. EXAM: CT ABDOMEN AND PELVIS WITH CONTRAST TECHNIQUE: Multidetector CT imaging of the abdomen and pelvis was performed using the standard protocol following bolus administration of intravenous contrast. CONTRAST:  125mL OMNIPAQUE IOHEXOL 300 MG/ML  SOLN COMPARISON:  09/20/2017 FINDINGS: Lower chest: 4 mm subpleural nodule along the minor fissure on image 14/6. 0.6 by 0.4 cm subpleural nodule in the right lower lobe on image 15/6. 5 mm subpleural nodule in the right middle lobe on image 23/6. 4 mm right lower lobe subpleural nodule on image 46/6. 2 mm lingular subpleural nodule on image 33/6. Most of these nodules were not included on the prior imaging of 09/20/2017. The scan performed today inadvertently included all the way up to the bottom of the aortic arch which is why many of these nodules are now included. Hepatobiliary: Unremarkable. Pancreas: Unremarkable Spleen: The spleen measures 16.6 by 6.6 by 13.1 cm (volume = 750 cm^3). Adrenals/Urinary Tract: 1.0 by 1.1 cm left adrenal nodule is technically nonspecific. The kidneys appear unremarkable. Stomach/Bowel: Unremarkable Vascular/Lymphatic: Peripancreatic node 2.0 cm in short axis on image 38/2, stable. Portacaval node 1.5 cm in short axis on image 44/2, stable. 1.4 cm mesenteric lymph node on image 49/2, stable. Reproductive: Unremarkable Other: Mild central mesenteric stranding, slightly improved from 09/20/2017 but not resolved.  Musculoskeletal: Unremarkable IMPRESSION: 1. Stable porta hepatis and mild mesenteric adenopathy with mesenteric stranding and mild splenomegaly. Although not entirely specific this constellation of findings could certainly be present in the setting of lymphoma. Sclerosing mesenteritis could cause the mesenteric stranding but would not necessarily explain the mild splenomegaly or the porta hepatis adenopathy. 2. Several tiny pulmonary nodules are present and mostly in a subpleural distribution favoring small lymph nodes. Consider follow up chest CT in 1 years time to reassess these nodules. Electronically Signed   By: Van Clines M.D.   On: 04/09/2018 10:43      CT AP W Contrast 04/08/18  IMPRESSION: 1. Stable porta hepatis and mild mesenteric adenopathy with mesenteric stranding and mild splenomegaly. Although not entirely specific this constellation of findings could certainly be present in the setting of lymphoma. Sclerosing mesenteritis could cause the mesenteric stranding but would not necessarily explain the mild splenomegaly or the porta hepatis adenopathy. 2. Several tiny pulmonary nodules are present and mostly in a subpleural distribution favoring small lymph nodes. Consider follow up chest CT in 1 years time to reassess these nodules.  ASSESSMENT & PLAN:  Julian Washington is a 38 y.o. male with   1. Abdominal lymphadenopathy  -His older 04/2010 CT showed enlarged mesenteric lymph nodes with largest 1.7cm at that time. His 09/2017 scan showed slightly enlarged mesenteric lymphadenopathy overall stable. He did have an acute illness with diarrhea, nausea, likely colitis at that time, so he is mesenteric adenopathy could be reactive to his acute colitis.  -CT AP from 04/08/18 shows stable porta hepatis node (2cm, unchanged since 09/2017 but new since 2012) and mild mesenteric adenopathy and mild splenomegaly and several tiny pulmonary nodules favoring small lymph nodes.  I have reviewed  the CT images with patient in person. -He did not have palpable peripheral adenopathy on initial exam, no B symptoms, no other adenopathy on the CT scan, my suspicion for lymphoma is low, also indeterminate for more such as follicular lymphoma is not ruled out. -I discussed his abdominal adenopathy is hard to biopsy due to the location and size. I will discuss in GI tumor board to see if EUS biopsy of the 2cm porta hepatis node is feasible.  -Labs reviewed from this week, CBC, CMP, and iron panel WNL except Hg at 12.4, iron at 28, Sat at 10.  - I will check LDH and JAK2 mutation today. (04/10/18) -Plan  to repeat CT abdomen/pelvis scan and follow up in a year.    2. Several subcentimeter pulmonary Nodules -Seen on CT AP from 04/08/18, just a 6 mm -He is young and never smoked, low risk for lung cancer. -I will obtain a CT chest without contrast in 3 months to monitor closely    3. Splenomegaly  -His 8/10/119 scan also showed 17cm spleen. I suspect this can be related to his fatty liver. I could not palpable spleen on initial exam. His spleen size was normal on his CT in 2012.  -His 2018 lipid panel showed was nearly within normal limits. He has tried to lose weight and has attempted [facility] which did not work for him. He plans to return to weight watchers. He knows his option of bariatric surgery if needed.  -His Spleen is slightly smaller at 16.6cm on CT CP from 04/08/18.  -There is no indication for treatment at this time.  I encouraged him to avoid injury to spleen as there is a risk for rupture due to its enlargement.  -He has normal CBC, except a mild iron deficient anemia, my suspicion for myeloproliferative neoplasm is low.    4. 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   -Labs from 04/08/18 shows mild iron deficient anemia. I recommend  he take OTC oral iron daily.    5. Obesity -I suggested [facility] which he tried before and would not like to return.  -He is not ready for any surgery at this time -I strongly encouraged him to continue with diet and exercise to work on weight loss. He agrees.      PLAN:  Lab today for JAK2/MPN panel  Ct chest wo contrast in 3 months  F/u in one year with lab and CT abdomen and pelvis with contrast one week before  GI tumor board discussion     No problem-specific Assessment & Plan notes found for this encounter.   Orders Placed This Encounter  Procedures  . CT Chest Wo Contrast    Standing Status:   Future    Standing Expiration Date:   06/09/2019    Order Specific Question:   Preferred imaging location?    Answer:   [facility]    Order Specific Question:   Radiology Contrast Protocol - do NOT remove file path    Answer:   \\[email or web address]  . CT Abdomen Pelvis W Contrast    Standing Status:   Future    Standing Expiration Date:   07/09/2019    Order Specific Question:   If indicated for the ordered procedure, I authorize the administration of contrast media per Radiology protocol    Answer:   Yes    Order Specific Question:   Preferred imaging location?    Answer:   [facility]    Order Specific Question:   Is Oral Contrast requested for this exam?    Answer:   Yes, Per Radiology protocol    Order Specific Question:   Radiology Contrast Protocol - do NOT remove file path    Answer:   \\[email or web address]  . LDH   All questions were answered. The patient knows to call the clinic with any problems, questions or concerns. No barriers to learning was detected. I spent 20 minutes counseling the patient face to face. The total time spent in the appointment was  25 minutes and more than 50% was on counseling and review of test results     Truitt Merle, MD 04/10/2018   I, Joslyn Devon,  am acting as scribe for Truitt Merle, MD.   I have reviewed the above documentation for accuracy and completeness, and I agree with the above.

## 2018-04-09 ENCOUNTER — Other Ambulatory Visit: Payer: Self-pay

## 2018-04-09 ENCOUNTER — Emergency Department (INDEPENDENT_AMBULATORY_CARE_PROVIDER_SITE_OTHER)
Admission: EM | Admit: 2018-04-09 | Discharge: 2018-04-09 | Disposition: A | Discharge status: Home / Self Care | Payer: Managed Care, Other (non HMO) | Source: Home / Self Care

## 2018-04-09 DIAGNOSIS — J0100 Acute maxillary sinusitis, unspecified: Secondary | ICD-10-CM

## 2018-04-09 DIAGNOSIS — R51 Headache: Secondary | ICD-10-CM

## 2018-04-09 DIAGNOSIS — R519 Headache, unspecified: Secondary | ICD-10-CM

## 2018-04-09 MED ORDER — PREDNISONE 20 MG PO TABS: ORAL_TABLET | ORAL | 0 refills | Status: DC

## 2018-04-09 MED ORDER — AMOXICILLIN-POT CLAVULANATE 875-125 MG PO TABS: 1.0000 | ORAL_TABLET | Freq: Two times a day (BID) | ORAL | 0 refills | Status: DC

## 2018-04-09 MED ORDER — DEXAMETHASONE SODIUM PHOSPHATE 10 MG/ML IJ SOLN
10.0000 mg | Freq: Once | INTRAMUSCULAR | Status: AC
  Administered 2018-04-09: 10 mg via INTRAMUSCULAR

## 2018-04-09 MED ORDER — AZITHROMYCIN 250 MG PO TABS: 250.0000 mg | ORAL_TABLET | Freq: Every day | ORAL | 0 refills | Status: DC

## 2018-04-09 NOTE — ED Provider Notes (Signed)
[facility]    CSN: [id number] Arrival date & time: 04/09/18  0818     History   Chief Complaint Chief Complaint  Patient presents with  . Headache    HPI Julian Washington is a 38 y.o. male.   HPI  Julian Washington is a 38 y.o. male presenting to UC with c/o intermittent nasal congestion and mild cough for about 1-2 weeks that worsened 3 days ago with thick yellow mucous.  Yesterday the mucous had nearly resolved and cleared up but he has since developed a worsening Right sided throbbing headache, radiating into his teeth. Hx of migraines but states this feels more like a sinus infection that his typical migraines.  Denies fever, chills, n/v/d.    Past Medical History:  Diagnosis Date  . ADHD   . Anemia   . Anxiety and depression    h/o suicidality w/ citalopram  . Diabetes mellitus without complication ([place])   . Difficult intubation   . ETD (eustachian tube dysfunction)    s/p ENT, declined ear tuves before   . GERD (gastroesophageal reflux disease)   . History of chicken pox    Titered on 08/16/2005  . Hypogonadism male    Dr Cruzita Lederer, used to see urology  . Infertility male   . Internal hemorrhoids    s/p banding  . Low testosterone   . Migraine    on topamax  . OSA on CPAP   . Palpitations   . 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 Prediabetes    A1C 6.2 years ago  . 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 Vitamin B 12 deficiency    h/o  . Vitamin D deficiency     Patient Active Problem List   Diagnosis Date Noted  . ADHD 02/28/2018  . Chronic venous insufficiency of lower extremity 02/28/2018  . Splenomegaly 09/24/2017  . 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  . Absolute anemia 12/04/2016  . Type 2 diabetes mellitus without complication, without long-term current use of insulin ([place]) 09/25/2016  . Morbid obesity ([place]) 09/25/2016  . PCP NOTES >>> 11/15/2014  . H/O: pituitary  tumor 01/28/2014  . Annual physical exam 07/13/2013  . Anxiety and depression   . Vitamin B 12 deficiency   . 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 Migraine   . OSA on CPAP   . Vitamin D deficiency   . Internal hemorrhoids   . GERD 06/02/2009    Past Surgical History:  Procedure Laterality Date  . 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 TONSILLECTOMY AND ADENOIDECTOMY         Home Medications    Prior to Admission medications   Medication Sig Start Date End Date Taking? Authorizing Provider  ACCU-CHEK FASTCLIX LANCETS MISC 1 Package by Does not apply route 2 (two) times daily. 04/23/17   Waldon Merl, PA-C  amoxicillin-clavulanate (AUGMENTIN) 875-125 MG tablet Take 1 tablet by mouth 2 (two) times daily. One po bid x 7 days 04/09/18   Noe Gens, PA-C  atomoxetine (STRATTERA) 100 MG capsule  02/18/18   [provider]  CONCERTA 18 MG CR tablet  02/22/18   [provider]  fexofenadine (ALLEGRA) 180 MG tablet Take 180 mg by mouth daily.    [provider]  fluticasone (FLONASE) 50 MCG/ACT nasal spray Place 2 sprays into both nostrils daily.  [provider]  furosemide (LASIX) 20 MG tablet Take 1 tablet (20 mg total) by mouth daily. 02/16/18   Colon Branch, MD  glucose blood (ACCU-CHEK GUIDE) test strip 1 each by Other route 2 (two) times daily. Use as instructed 04/23/17   Waldon Merl, PA-C  Magnesium Oxide (MAG-OXIDE PO) Take 500 mg by mouth daily.    [provider]  metFORMIN (GLUMETZA) 1000 MG (MOD) 24 hr tablet Take 2 tablets (2,000 mg total) by mouth daily. Patient not taking: Reported on 02/27/2018 08/06/17   Renato Shin, MD  Multiple Vitamin (MULTIVITAMIN) tablet Take 1 tablet by mouth daily.     [provider]  pantoprazole (PROTONIX) 40 MG tablet Take 1 tablet (40 mg total) by mouth daily. 05/21/17   Colon Branch, MD  predniSONE (DELTASONE) 20 MG tablet 3 tabs po day one, then 2 po daily x 4  days 04/09/18   Noe Gens, PA-C  verapamil (CALAN-SR) 240 MG CR tablet Take 1 tablet (240 mg total) by mouth at bedtime. 01/23/18   Colon Branch, MD    Family History Family History  Problem Relation Age of Onset  . Obesity Mother   . Depression Father   . Anxiety disorder Father   . Bipolar disorder Father   . Drug abuse Father   . Cancer Maternal Grandfather        GBM   . Diabetes Paternal Grandmother   . Hypertension Paternal Grandmother   . Diabetes Paternal Grandfather   . Cancer Paternal Grandfather        prostate cancer  . Prostate cancer Other        GF  . CAD Other        PGF  . Colon cancer Neg Hx     Social History Social History   Tobacco Use  . Smoking status: Never Smoker  . Smokeless tobacco: Never Used  Substance Use Topics  . Alcohol use: Yes    Alcohol/week: 0.0 standard drinks    Comment: occ  . Drug use: No     Allergies   Levaquin [levofloxacin in d5w]; Celexa [citalopram]; Dextrans; Hydrocodone-acetaminophen; Latex; and Oxycodone   Review of Systems Review of Systems  Constitutional: Negative for chills and fever.  HENT: Positive for congestion, postnasal drip, sinus pressure and sinus pain. Negative for sore throat.   Respiratory: Positive for cough (minimal). Negative for shortness of breath.   Gastrointestinal: Negative for diarrhea, nausea and vomiting.  Neurological: Positive for headaches. Negative for dizziness and light-headedness.     Physical Exam Triage Vital Signs ED Triage Vitals  Enc Vitals Group     BP 04/09/18 0843 126/88     Pulse Rate 04/09/18 0843 79     Resp --      Temp 04/09/18 0843 97.8 F (36.6 C)     Temp Source 04/09/18 0843 Oral     SpO2 04/09/18 0843 100 %     Weight 04/09/18 0844 (!) 392 lb (177.8 kg)     Height 04/09/18 0844 5\' 11"  (1.803 m)     Head Circumference --      Peak Flow --      Pain Score 04/09/18 0843 7     Pain Loc --      Pain Edu? --      Excl. in [place]? --    No data  found.  Updated Vital Signs BP 126/88 (BP Location: Right Arm)   Pulse 79  Temp 97.8 F (36.6 C) (Oral)   Ht 5\' 11"  (1.803 m)   Wt (!) 392 lb (177.8 kg)   SpO2 100%   BMI 54.67 kg/m   Visual Acuity Right Eye Distance:   Left Eye Distance:   Bilateral Distance:    Right Eye Near:   Left Eye Near:    Bilateral Near:     Physical Exam Vitals signs and nursing note reviewed.  Constitutional:      Appearance: He is well-developed.  HENT:     Head: Normocephalic and atraumatic.     Right Ear: Tympanic membrane normal.     Left Ear: Tympanic membrane normal.     Nose:     Right Sinus: Maxillary sinus tenderness present. No frontal sinus tenderness.     Left Sinus: No maxillary sinus tenderness or frontal sinus tenderness.     Mouth/Throat:     Lips: Pink.     Mouth: Mucous membranes are moist.     Pharynx: Oropharynx is clear. Uvula midline.  Neck:     Musculoskeletal: Normal range of motion and neck supple.  Cardiovascular:     Rate and Rhythm: Normal rate and regular rhythm.  Pulmonary:     Effort: Pulmonary effort is normal. No respiratory distress.     Breath sounds: Normal breath sounds. No stridor. No wheezing or rhonchi.  Musculoskeletal: Normal range of motion.  Skin:    General: Skin is warm and dry.  Neurological:     Mental Status: He is alert and oriented to person, place, and time.  Psychiatric:        Behavior: Behavior normal.      UC Treatments / Results  Labs (all labs ordered are listed, but only abnormal results are displayed) Labs Reviewed - No data to display  EKG None  Radiology No results found.  Procedures Procedures (including critical care time)  Medications Ordered in UC Medications  dexamethasone (DECADRON) injection 10 mg (10 mg Intramuscular Given 04/09/18 0852)    Initial Impression / Assessment and Plan / UC Course  I have reviewed the triage vital signs and the nursing notes.  Pertinent labs & imaging results that  were available during my care of the patient were reviewed by me and considered in my medical decision making (see chart for details).     Hx and exam c/w sinusitis  Home care info provided.  Final Clinical Impressions(s) / UC Diagnoses   Final diagnoses:  Acute non-recurrent maxillary sinusitis  Right-sided headache     Discharge Instructions      Please take antibiotics as prescribed and be sure to complete entire course even if you start to feel better to ensure infection does not come back.  You may take 500mg  acetaminophen every 4-6 hours or in combination with ibuprofen 400-600mg  every 6-8 hours as needed for pain, inflammation, and fever.  Be sure to well hydrated with clear liquids and get at least 8 hours of sleep at night, preferably more while sick.   Please follow up with family medicine in 1 week if needed.  You were given a shot of decadron (a steroid) today to help with pain and inflammation. You may start the oral prednisone tomorrow with breakfast.     ED Prescriptions    Medication Sig Dispense Auth. Provider   azithromycin (ZITHROMAX) 250 MG tablet  (Status: Discontinued) Take 1 tablet (250 mg total) by mouth daily. Take first 2 tablets together, then 1 every day until finished. 6 tablet  Gerarda Fraction, Yenny Kosa O, PA-C   predniSONE (DELTASONE) 20 MG tablet 3 tabs po day one, then 2 po daily x 4 days 11 tablet Gerarda Fraction, Rusty Villella O, PA-C   amoxicillin-clavulanate (AUGMENTIN) 875-125 MG tablet Take 1 tablet by mouth 2 (two) times daily. One po bid x 7 days 14 tablet Noe Gens, [place]     Controlled Substance Prescriptions [place] Controlled Substance Registry consulted? Not Applicable   Tyrell Antonio 04/09/18 [id number]

## 2018-04-09 NOTE — ED Triage Notes (Signed)
Cold Monday and Tuesday, thick mucous from nose.  Headache started last night, throbbing around right eye.  Took 800 mg ibuprofen at 7 am relieving some.

## 2018-04-09 NOTE — Discharge Instructions (Signed)
°  Please take antibiotics as prescribed and be sure to complete entire course even if you start to feel better to ensure infection does not come back.  You may take 500mg  acetaminophen every 4-6 hours or in combination with ibuprofen 400-600mg  every 6-8 hours as needed for pain, inflammation, and fever.  Be sure to well hydrated with clear liquids and get at least 8 hours of sleep at night, preferably more while sick.   Please follow up with family medicine in 1 week if needed.  You were given a shot of decadron (a steroid) today to help with pain and inflammation. You may start the oral prednisone tomorrow with breakfast.

## 2018-04-10 ENCOUNTER — Inpatient Hospital Stay: Payer: Managed Care, Other (non HMO) | Admitting: Hematology

## 2018-04-10 ENCOUNTER — Encounter: Payer: Self-pay | Admitting: Hematology

## 2018-04-10 ENCOUNTER — Inpatient Hospital Stay: Payer: Managed Care, Other (non HMO)

## 2018-04-10 ENCOUNTER — Telehealth: Payer: Self-pay | Admitting: Hematology

## 2018-04-10 VITALS — BP 136/75 | HR 73 | Temp 97.6°F | Resp 17 | Ht 71.0 in | Wt 392.8 lb

## 2018-04-10 DIAGNOSIS — R590 Localized enlarged lymph nodes: Secondary | ICD-10-CM | POA: Diagnosis not present

## 2018-04-10 DIAGNOSIS — R161 Splenomegaly, not elsewhere classified: Secondary | ICD-10-CM

## 2018-04-10 DIAGNOSIS — E669 Obesity, unspecified: Secondary | ICD-10-CM

## 2018-04-10 DIAGNOSIS — E291 Testicular hypofunction: Secondary | ICD-10-CM

## 2018-04-10 DIAGNOSIS — R918 Other nonspecific abnormal finding of lung field: Secondary | ICD-10-CM

## 2018-04-10 DIAGNOSIS — D509 Iron deficiency anemia, unspecified: Secondary | ICD-10-CM

## 2018-04-10 DIAGNOSIS — R1907 Generalized intra-abdominal and pelvic swelling, mass and lump: Secondary | ICD-10-CM

## 2018-04-10 LAB — LACTATE DEHYDROGENASE: LDH: 167 U/L (ref 98–192)

## 2018-04-10 NOTE — Telephone Encounter (Signed)
Scheduled appt per 02/28 los. ° °Printed calendar and avs. °

## 2018-04-17 ENCOUNTER — Telehealth: Payer: Self-pay | Admitting: Hematology

## 2018-04-17 NOTE — Telephone Encounter (Signed)
I called patient and discussed his lab test of the MPN panel, which showed no detectable mutations.  We also reviewed his images in our GI tumor board, although the peripancreatic lymph nodes is technically feasible for biopsy through EUS, we all felt his abdominal adenopathy has been stable over several years, likely benign, and we recommend continue monitoring without biopsy.  I discussed with patient, he agrees with the plan.  Truitt Merle  04/17/2018

## 2018-04-27 ENCOUNTER — Other Ambulatory Visit: Payer: Self-pay | Admitting: Internal Medicine

## 2018-04-29 LAB — JAK2 (INCLUDING V617F AND EXON 12), MPL,& CALR W/RFL MPN PANEL (NGS)

## 2018-04-30 ENCOUNTER — Ambulatory Visit: Payer: Managed Care, Other (non HMO) | Admitting: Internal Medicine

## 2018-04-30 ENCOUNTER — Other Ambulatory Visit: Payer: Self-pay

## 2018-04-30 ENCOUNTER — Encounter: Payer: Self-pay | Admitting: Internal Medicine

## 2018-04-30 VITALS — BP 138/80 | HR 90 | Temp 98.4°F | Ht 71.0 in | Wt 386.0 lb

## 2018-04-30 DIAGNOSIS — E230 Hypopituitarism: Secondary | ICD-10-CM

## 2018-04-30 DIAGNOSIS — R7303 Prediabetes: Secondary | ICD-10-CM | POA: Diagnosis not present

## 2018-04-30 DIAGNOSIS — Z87898 Personal history of other specified conditions: Secondary | ICD-10-CM | POA: Diagnosis not present

## 2018-04-30 LAB — LUTEINIZING HORMONE: LH: 2.43 m[IU]/mL (ref 1.50–9.30)

## 2018-04-30 LAB — FOLLICLE STIMULATING HORMONE: FSH: 4.2 m[IU]/mL (ref 1.4–18.1)

## 2018-04-30 LAB — CORTISOL: CORTISOL PLASMA: 7.5 ug/dL

## 2018-04-30 LAB — TSH: TSH: 2.71 u[IU]/mL (ref 0.35–4.50)

## 2018-04-30 LAB — T4, FREE: Free T4: 0.87 ng/dL (ref 0.60–1.60)

## 2018-04-30 LAB — T3, FREE: T3, Free: 4 pg/mL (ref 2.3–4.2)

## 2018-04-30 NOTE — Progress Notes (Signed)
Patient ID: Julian Washington, male   DOB: May 24, 1980, 38 y.o.   MRN: [id number]  HPI: Julian Washington is a 38 y.o.-year-old man, returning for f/u for hypogonadotropic hypogonadism, history of pituitary adenoma, s/p TSR, and prediabetes. Last visit 1 year and 4 months ago.  At last visit, he and his wife were waiting for the insurance to cover an IVF procedure.  Also, his wife diabetes had to improve before the procedure.  He was dx'ed with ADHD in 20/2542 >> started Concerta, Straterra. Feels that this helps with his appetite. Lost from 401 >> 386 in last 2 mo.  He also had diarrhea and enlarged abd. LN >> was on Flagyl >> stopped Metformin then. He restarted 02/2018.  He stopped seeing Dr. Leafy Ro in the weight loss clinic.  After he stopped seeing her, he also stopped Victoza.  History of pituitary adenoma: Reviewed history:  The patient has been diagnosed with hypogonadotropic hypogonadism ~2012, after trying to achieve a pregnancy. He tried testosterone replacement and clomiphene (which did not work), a cousin who is a medical resident suggested to have further investigation to find out the cause for his hypogonadism. His urologist at that time ordered a pituitary MRI and he has been found to have a pituitary microadenoma on MRI in 12/2011. He was referred to neurosurgery - Dr Luther Hearing - and he ended up having TSR in 02/2012. Surgical pathology showed:  The sections show monomorphic nests of neoplastic cells with neuroendocrine nuclear features, consistent with a pituitary adenoma. The tumor is strongly and diffusely immunoreactive for prolactin (100% of cells). There is patchy staining for adrenocorticotropic hormone (less than 5% of cells). The cells are negative for TSH, HGH, FSH, and beta-LH.   His highest prolactin level was 44.7 before the surgery. This has decreased after the surgery.  I reviewed patient's pertinent labs per records from [facility] [place]: 11/2010: TSH 2 02/2011: Testosterone 0.89, free testosterone 2.2 (9.3-26.5) 11/17/2011: TSH 2.35, FSH 2.08, LH 1.44, testosterone 0.08 (2.41-8.27) 12/17/2011: Prolactin 44.7, estradiol 23, cortisol 2.9, hemoglobin A1c 6.2% 02/19/2012: TSH 1.9, total T3 106, free T4 1, hemoglobin A1c 5.8% 03/05/2012: Prolactin 1.4, cortisol 37.2, hemoglobin A1c 5.6%  04/02/2012: Prolactin 3.2, testosterone 79 ([phone]), hemoglobin A1c 6.1%  06/19/2012: Total testosterone 119 ([phone]), free testosterone 23.5 (40-224), SHBG 17 (10-50), LH  3.4, FSH 4.7, ACTH 20, cortisol 6.5, TSH 3.072, free T4 0.9 09/23/2012: Total testosterone 115, free testosterone 20.5, SHBG 20 04/02/2013: Total testosterone 76, free testosterone 15.6, SHBG 15  04/26/2013: TSH 2.34, free T4 0.9  Pituitary MRI 12/28/2011: Heterogeneous signal intensity lesion centered in the anterior aspect of the sella measures approximately 1 cm in maximum dimension and demonstrates a fluid hematocrit level with some intrinsic T1 bright/T2 dark signal along its caudal margin consistent with subacute hemorrhage. There is no mass effect on the optic chiasm or evidence of cavernous sinus invasion.  Pituitary MRI 09/2012: Right lateral sellar soft tissue, likely residual pituitary 0.6 x 0.4 x 0.9 cm, homogeneous postcontrast enhancement, abuts medial aspect of the right cavernous ICA. No mass effect.  Pituitary MRI (10/05/2014): No residual tumor.  Pituitary MRI (09/25/2017): Postoperative changes from partial pituitary resection. No recurrent tumor seen. No change compared with September 28, 2014.  No headaches or visual disc turbulence at this visit.  Hypogonadotropic hypogonadism:  Reviewed history: Diagnosed when he and his wife were trying to get pregnant. They are preparing for IVF at [facility] >> wife's diabetes is not  under control and he needs to be between 6 and 7% for the procedure. They are seeing reproductive endocrinology: Dr  Robbie Louis, MD.  He started to have low T sxs 2011-2012.  He was previously on Testosterone: axillar gel (did not like it as he was always fearing that he can transfer this to his wife) and inj (200 mg 2x a month). On this >> T level normalized, however he cannot do the injections himself. Clomid did not help (3 mo) >> lost capacity to ejaculate and got hot flushes when stopped.   He has decreased libido.  No difficulty obtaining but has pbs maintaining an erection No trauma to testes, testicular irradiation or surgery No h/o of mumps orchitis/h/o autoimmune ds. No h/o cryptorchidism He grew and went through puberty like his peers No shrinking of testes. + small testes (<5 ml) No incomplete/delayed sexual development     No breast discomfort/gynecomastia    No loss of body hair (axillary/pubic)/decreased need for shaving No height loss No abnormal sense of smell  No hot flushes No vision problems No worst HA of his life, but had HA No FH of hypogonadism/infertility  No FH of hemochromatosis or pituitary tumors No excessive weight gain or loss.  No chronic diseases No chronic pain. Not on opiates, does not take steroids.  No more than 2 drinks a day of alcohol at a time, and this is rarely No anabolic steroids use No herbal medicines  No AI ds in his family, no FH of MS.  He does not have family history of early cardiac disease. GF with AMI at 38 y/o.   Reviewed his pituitary labs from the last 2 years: Normal except testosterone/LH/FSH:  Component     Latest Ref Rng & Units 02/10/2017  Testosterone, Serum (Total)     264-916 ng/dL 46 (L)  % Free Testosterone     % 1.4  Free Testosterone, S     52-280 pg/mL  6.4 (L)  Sex Hormone Binding Globulin     nmol/L 25.9  IGF-I, LC/MS     53 - 331 ng/mL 64  Z-Score (Male)     -2.0 - 2 SD -1.6  FSH     1.4 - 18.1 mIU/ML 3.4  LH     1.50 - 9.30 mIU/mL 2.65  Cortisol, Plasma     ug/dL 5.0  C206 ACTH     6 - 50  pg/mL 28  Prolactin     2.0 - 18.0 ng/mL 3.3  TSH     0.35 - 4.50 uIU/mL 2.71  T4,Free(Direct)     0.60 - 1.60 ng/dL 0.78  Triiodothyronine,Free,Serum     2.3 - 4.2 pg/mL 3.1    Component     Latest Ref Rng & Units 01/10/2016  Testosterone     264 - 916 ng/dL 29 (L)  Testosterone Free     8.7 - 25.1 pg/mL 1.6 (L)  Sex Horm Binding Glob, Serum     16.5 - 55.9 nmol/L 22.8  IGF-I, LC/MS     53 - 331 ng/mL 61  Z-Score (Male)     -2.0 - 2.0 SD -1.7  FSH     1.4 - 18.1 mIU/ML 3.7  LH     1.50 - 9.30 mIU/mL 2.76  T4,Free(Direct)     0.60 - 1.60 ng/dL 0.79  Triiodothyronine,Free,Serum     2.3 - 4.2 pg/mL 3.6  Cortisol, Plasma     ug/dL 4.8  C206 ACTH  6 - 50 pg/mL 18  TSH     0.35 - 4.50 uIU/mL 1.27  Prolactin     2.0 - 18.0 ng/mL 4.2   He has been diagnosed with OSA in 2011.  He wears a CPAP machine. She used to work night shifts, but now works days.  Prediabetes:  Review latest HbA1c levels: Lab Results  Component Value Date   HGBA1C 6.9 (H) 02/27/2018   HGBA1C 5.5 11/20/2016   HGBA1C 6.2 (H) 07/11/2016  Prev. 5.9%, prev. 6.2% and 6.3% (see above)  + History of dyslipidemia: Lab Results  Component Value Date   CHOL 122 11/20/2016   HDL 33 (L) 11/20/2016   LDLCALC 65 11/20/2016   TRIG 118 11/20/2016   CHOLHDL 3 07/26/2015   Last eye exam: 02/2018: No DR  He continues on: -Metformin ER 1000 mg at night He was also previously on Saxenda, now off after he stopped seeing Dr. Leafy Ro  Obesity: He tried Weight Watchers >> reached a plateau -stopped. Off Victoza. He started Concerta and Strattera, which he feels helped with suppressed appetite.  He also has a history of B12 deficiency.   He has iron deficiency anemia.  ROS: Constitutional: + Weight gain/+ weight loss, no fatigue, no subjective hyperthermia, no subjective hypothermia Eyes: no blurry vision, no xerophthalmia ENT: no sore throat, no nodules palpated in neck, no dysphagia, no  odynophagia, no hoarseness Cardiovascular: no CP/no SOB/no palpitations/no leg swelling Respiratory: no cough/no SOB/no wheezing Gastrointestinal: no N/no V/no D/no C/no acid reflux Musculoskeletal: no muscle aches/no joint aches Skin: no rashes, no hair loss Neurological: no tremors/no numbness/no tingling/no dizziness  I reviewed pt's medications, allergies, PMH, social hx, family hx, and changes were documented in the history of present illness. Otherwise, unchanged from my initial visit note.   Past Medical History:  Diagnosis Date  . ADHD   . Anemia   . Anxiety and depression    h/o suicidality w/ citalopram  . Diabetes mellitus without complication ([place])   . Difficult intubation   . ETD (eustachian tube dysfunction)    s/p ENT, declined ear tuves before   . GERD (gastroesophageal reflux disease)   . History of chicken pox    Titered on 08/16/2005  . Hypogonadism male    Dr Cruzita Lederer, used to see urology  . Infertility male   . Internal hemorrhoids    s/p banding  . Low testosterone   . Migraine    on topamax  . OSA on CPAP   . Palpitations   . 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[place])    h/o increased prolactin, Dr Cruzita Lederer (previously @ [facility])  . Prediabetes    A1C 6.2 years ago  . 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 Vitamin B 12 deficiency    h/o  . Vitamin D deficiency    Past Surgical History:  Procedure Laterality Date  . 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 TONSILLECTOMY AND ADENOIDECTOMY     Social History   Socioeconomic History  . Marital status: Married    Spouse name: Not on file  . Number of children: 0  . Years of education: Not on file  . Highest education level: Not on file  Occupational History  . Occupation: Therapist, sports, [employer] short stay    Employer: [employer]  Social Needs  . Financial resource strain: Not on file  . Food insecurity:    Worry: Not on file    Inability: Not on  file  . Transportation needs:    Medical: Not  on file    Non-medical: Not on file  Tobacco Use  . Smoking status: Never Smoker  . Smokeless tobacco: Never Used  Substance and Sexual Activity  . Alcohol use: Yes    Alcohol/week: 0.0 standard drinks    Comment: occ  . Drug use: No  . Sexual activity: Not on file  Lifestyle  . Physical activity:    Days per week: Not on file    Minutes per session: Not on file  . Stress: Not on file  Relationships  . Social connections:    Talks on phone: Not on file    Gets together: Not on file    Attends religious service: Not on file    Active member of club or organization: Not on file    Attends meetings of clubs or organizations: Not on file    Relationship status: Not on file  . Intimate partner violence:    Fear of current or ex partner: Not on file    Emotionally abused: Not on file    Physically abused: Not on file    Forced sexual activity: Not on file  Other Topics Concern  . Not on file  Social History Narrative   Lives w/ wife   Current Outpatient Medications on File Prior to Visit  Medication Sig Dispense Refill  . ACCU-CHEK FASTCLIX LANCETS MISC 1 Package by Does not apply route 2 (two) times daily. 100 each 0  . amoxicillin-clavulanate (AUGMENTIN) 875-125 MG tablet Take 1 tablet by mouth 2 (two) times daily. One po bid x 7 days 14 tablet 0  . atomoxetine (STRATTERA) 100 MG capsule     . CONCERTA 18 MG CR tablet     . fexofenadine (ALLEGRA) 180 MG tablet Take 180 mg by mouth daily.    . fluticasone (FLONASE) 50 MCG/ACT nasal spray Place 2 sprays into both nostrils daily.    . furosemide (LASIX) 20 MG tablet Take 1 tablet (20 mg total) by mouth daily. 30 tablet 0  . glucose blood (ACCU-CHEK GUIDE) test strip 1 each by Other route 2 (two) times daily. Use as instructed 100 each 0  . Magnesium Oxide (MAG-OXIDE PO) Take 500 mg by mouth daily.    . metFORMIN (GLUMETZA) 1000 MG (MOD) 24 hr tablet Take 2 tablets (2,000 mg total) by mouth daily. 180 tablet 3  . Multiple  Vitamin (MULTIVITAMIN) tablet Take 1 tablet by mouth daily.     . pantoprazole (PROTONIX) 40 MG tablet Take 1 tablet (40 mg total) by mouth daily. 90 tablet 3  . predniSONE (DELTASONE) 20 MG tablet 3 tabs po day one, then 2 po daily x 4 days 11 tablet 0  . verapamil (CALAN-SR) 240 MG CR tablet Take 1 tablet (240 mg total) by mouth at bedtime. 90 tablet 1   No current facility-administered medications on file prior to visit.    Allergies  Allergen Reactions  . Levaquin [Levofloxacin In D5w] Swelling  . Celexa [Citalopram] Other (See Comments)    Mental status changes   . Dextrans Other (See Comments)    Makes the pt. Drowsy.   . Hydrocodone-Acetaminophen     REACTION: itching  . Latex   . Oxycodone Itching   Family History  Problem Relation Age of Onset  . Obesity Mother   . Depression Father   . Anxiety disorder Father   . Bipolar disorder Father   . Drug abuse Father   .  Cancer Maternal Grandfather        GBM   . Diabetes Paternal Grandmother   . Hypertension Paternal Grandmother   . Diabetes Paternal Grandfather   . Cancer Paternal Grandfather        prostate cancer  . Prostate cancer Other        GF  . CAD Other        PGF  . Colon cancer Neg Hx     PE: BP 138/80   Pulse 90   Temp 98.4 F (36.9 C)   Ht 5\' 11"  (1.803 m)   Wt (!) 386 lb (175.1 kg)   SpO2 99%   BMI 53.84 kg/m  Body mass index is 53.84 kg/m. Wt Readings from Last 3 Encounters:  04/30/18 (!) 386 lb (175.1 kg)  04/10/18 (!) 392 lb 12.8 oz (178.2 kg)  04/09/18 (!) 392 lb (177.8 kg)   Constitutional: Obese, in NAD Eyes: PERRLA, EOMI, no exophthalmos ENT: moist mucous membranes, no thyromegaly, no cervical lymphadenopathy Cardiovascular: RRR, No MRG Respiratory: CTA B Gastrointestinal: abdomen soft, NT, ND, BS+ Musculoskeletal: no deformities, strength intact in all 4 Skin: moist, warm, + Rash on bilateral shins - ? necrobiosis lipoidica diabeticorum Neurological: no tremor with outstretched  hands, DTR normal in all 4  ASSESSMENT: 1. H/o Pituitary adenoma (prolactinoma) with pituitary apoplexy - status post TSR 02/2012  2. Hypogonadotropic hypogonadism  3. Prediabetes  4. Obesity  PLAN:  1. History of pituitary adenoma -Patient has a history of a 1 cm pituitary adenoma and he is status post transsphenoidal resection.  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 -Reviewed pituitary labs from 2017 in 2018 and they were normal with the exception of the gonadotropic axis: Low testosterone -We will repeat his pituitary labs now.m. - He has pituitary MRIs performed by Dr. Salomon Fick (neurosurgery).  Reviewed latest MRI report from 09/2017: No recurrence.  2. Hypogonadotropic hypogonadism  -He has a long history of hypogonadotropic hypogonadism, with fairly poor response to treatment based on available testosterone levels -At last visit, he and his wife were preparing for IVF, therefore, I did not suggest to start testosterone, but offered Clomid.  However, he did not benefit from Clomid in the past.  He refused Clomid at that time and wanted to wait until they get the IVF and then start testosterone.  At this visit, he tells me that he would like to wait a little more, until his wife's diabetes gets under control.  3. Prediabetes -His HbA1c was higher, 2 months ago, at 6.9% (previously 5.5%).  We discussed that this qualifies as diabetes. -Continues metformin ER 1000 mg at night, started approximately 2 months ago after the above HbA1c returned -We discussed that at this point, his prediabetes/diabetes is reversible but he absolutely needs to lose weight.  He already started to do so.  4. Obesity  - Unfortunately, he gained a lot of weight (approximately 30 pounds) since I last saw him, and 50 pounds since 2 years ago -He was seeing Dr. Leafy Ro at the weight loss center >> stopped -He is now on ADHD medications which limit his appetite and he already lost 15 pounds in the last 2  months -We discussed that we may need to add Victoza back.  I advised him to start checking his sugars at home and let me know how they do.  Depending on the values, we may need to restart GLP-1 receptor agonist.  Component     Latest Ref Rng & Units 04/30/2018  Testosterone, Serum (Total)     ng/dL 33 (L)  % Free Testosterone     % 2.2  Free Testosterone, S     pg/mL 7.3 (L)  Sex Hormone Binding Globulin     nmol/L 21.4  IGF-I, LC/MS     53 - 331 ng/mL 49 (L)  Z-Score (Male)     -2.0 - 2 SD -2.1 (L)  FSH     1.4 - 18.1 mIU/ML 4.2  LH     1.50 - 9.30 mIU/mL 2.43  Cortisol, Plasma     ug/dL 7.5  C206 ACTH     6 - 50 pg/mL 20  Prolactin     2.0 - 18.0 ng/mL 5.7  TSH     0.35 - 4.50 uIU/mL 2.71  T4,Free(Direct)     0.60 - 1.60 ng/dL 0.87  Triiodothyronine,Free,Serum     2.3 - 4.2 pg/mL 4.0   As expected, testosterone is still low.  Also, IGF-I is slightly low.  We will need to discuss about the pluses and minuses of adding growth hormone at next visit.  The rest of the labs are normal.  Philemon Kingdom, MD PhD [facility] Endocrinology

## 2018-04-30 NOTE — Patient Instructions (Signed)
Please stop at the lab.  Please come back for a follow-up appointment in 4 months.   

## 2018-05-04 LAB — ACTH: C206 ACTH: 20 pg/mL (ref 6–50)

## 2018-05-04 LAB — TESTOSTERONE, FREE AND TOTAL (INCLUDES SHBG)-(MALES)
% FREE TESTOSTERONE: 2.2 %
Free Testosterone, S: 7.3 pg/mL — ABNORMAL LOW
Sex Hormone Binding Globulin: 21.4 nmol/L
Testosterone, Serum (Total): 33 ng/dL — ABNORMAL LOW

## 2018-05-04 LAB — INSULIN-LIKE GROWTH FACTOR
IGF-I, LC/MS: 49 ng/mL — ABNORMAL LOW (ref 53–331)
Z-Score (Male): -2.1 SD — ABNORMAL LOW (ref ?–2.0)

## 2018-05-04 LAB — PROLACTIN: Prolactin: 5.7 ng/mL (ref 2.0–18.0)

## 2018-05-16 ENCOUNTER — Other Ambulatory Visit: Payer: Self-pay | Admitting: Internal Medicine

## 2018-05-26 ENCOUNTER — Encounter: Payer: Self-pay | Admitting: Internal Medicine

## 2018-05-31 IMAGING — US US EXTREM LOW VENOUS BILAT
1 series · 14 of 24 positions shown · non-contrast
Comparison: None

CLINICAL DATA: Bilateral edema

EXAM:
BILATERAL LOWER EXTREMITY VENOUS DOPPLER ULTRASOUND
TECHNIQUE: Gray-scale sonography with compression, as well as color and duplex
ultrasound, were performed to evaluate the deep venous system from
the level of the common femoral vein through the popliteal and
proximal calf veins.

[Series 1: us extrem low venous bilat · 0.12mm/px · 14 of 50 slices shown]
[im 1/50]
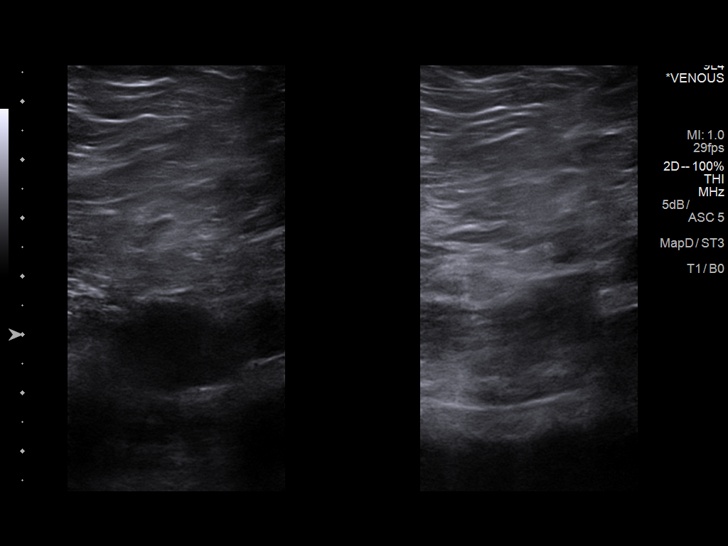
[im 5/50]
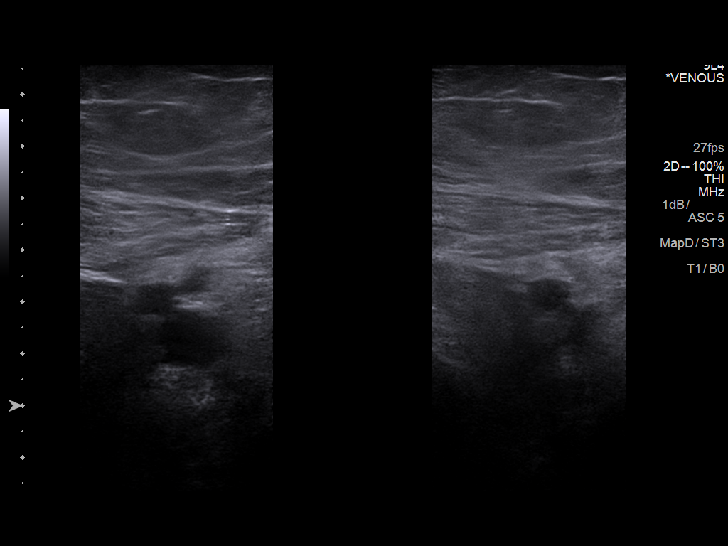
[im 9/50]
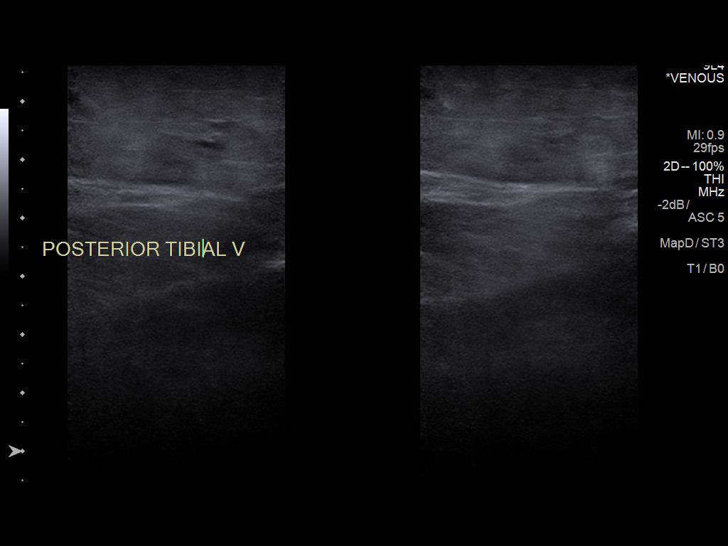
[im 13/50]
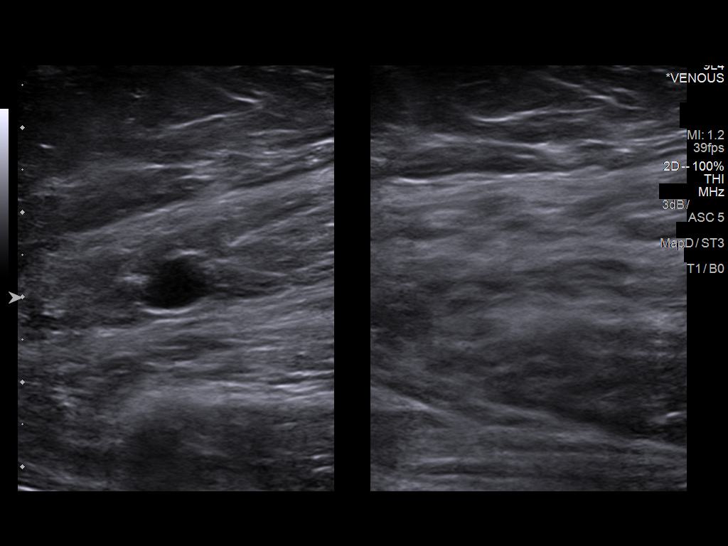
[im 15/50]
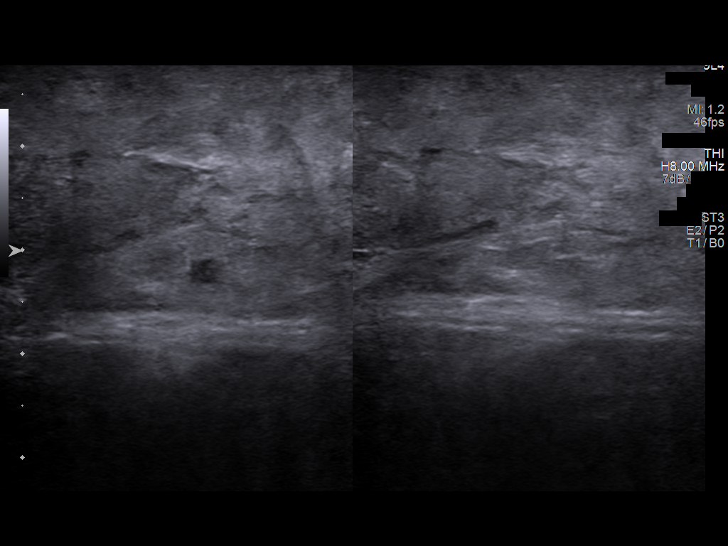
[im 20/50]
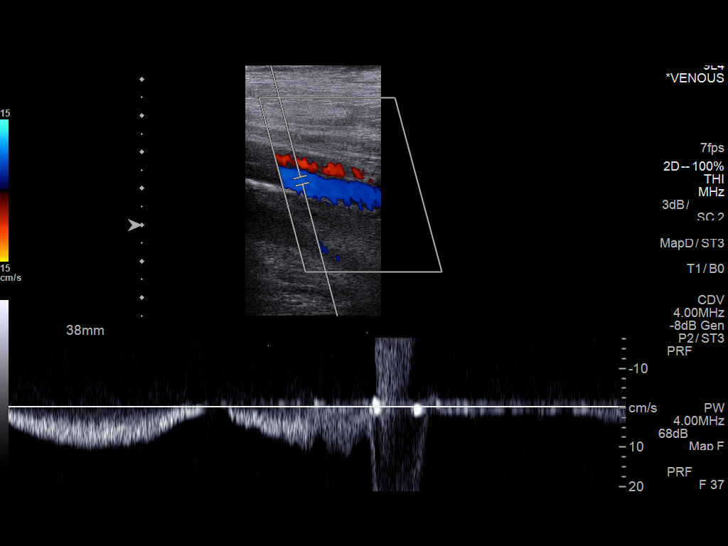
[im 24/50]
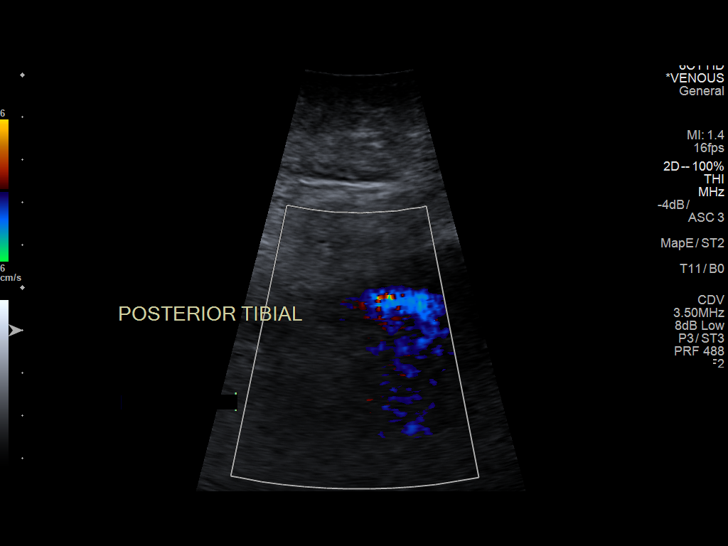
[im 26/50]
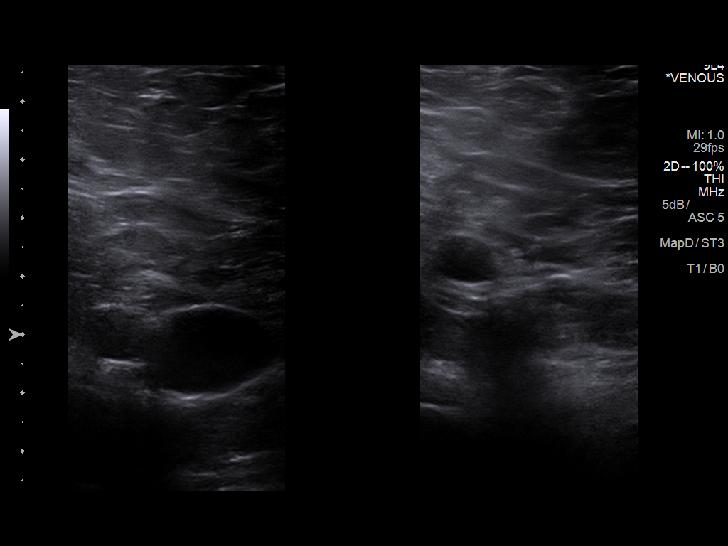
[im 30/50]
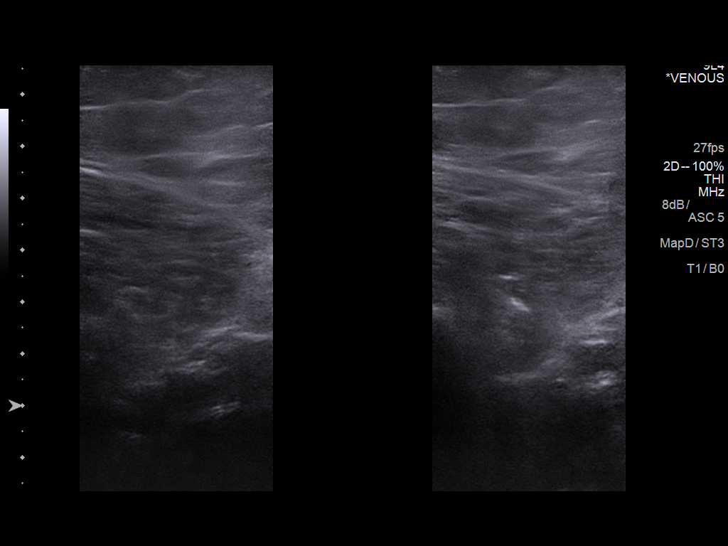
[im 35/50]
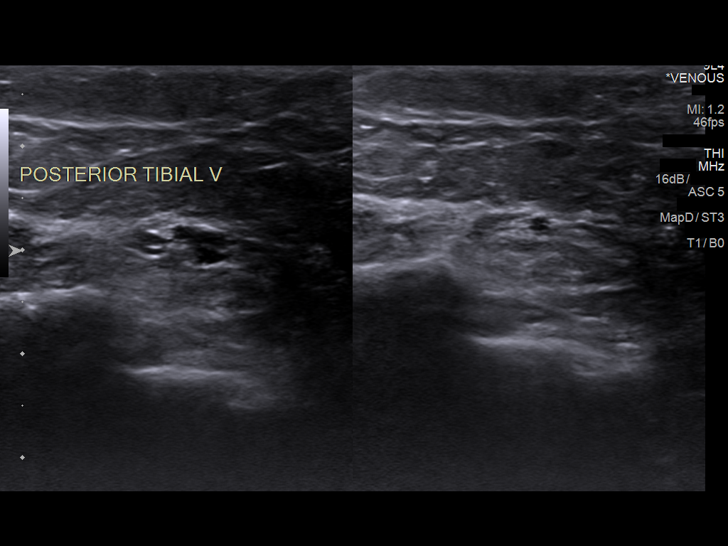
[im 39/50]
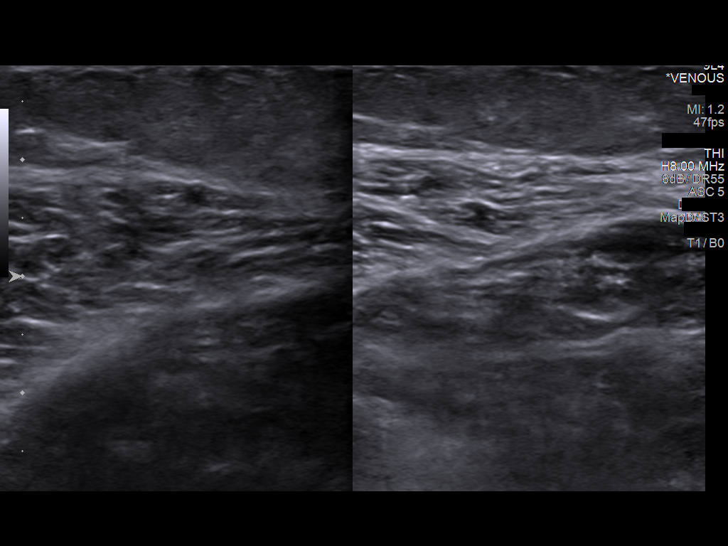
[im 41/50]
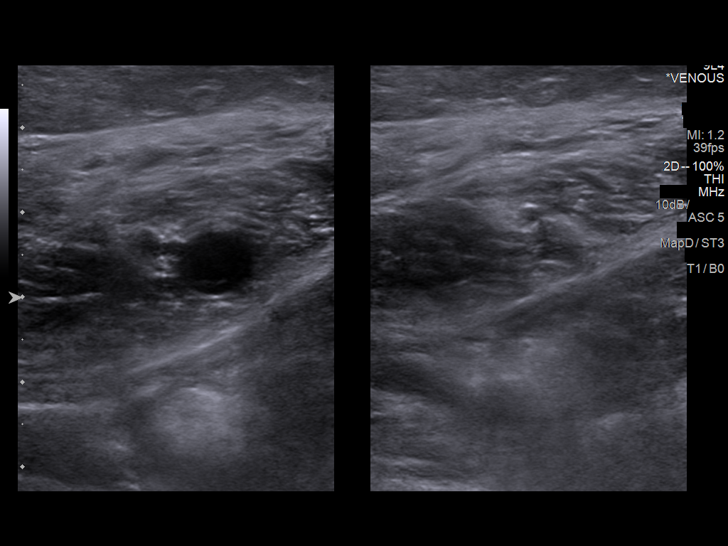
[im 45/50]
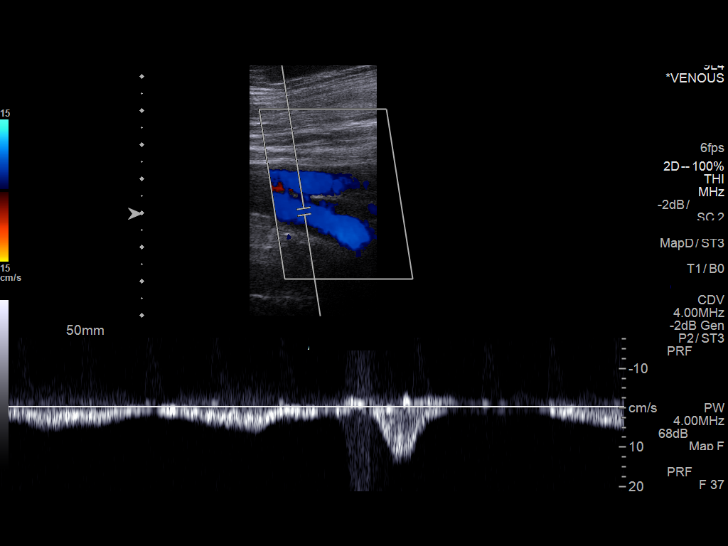
[im 50/50]
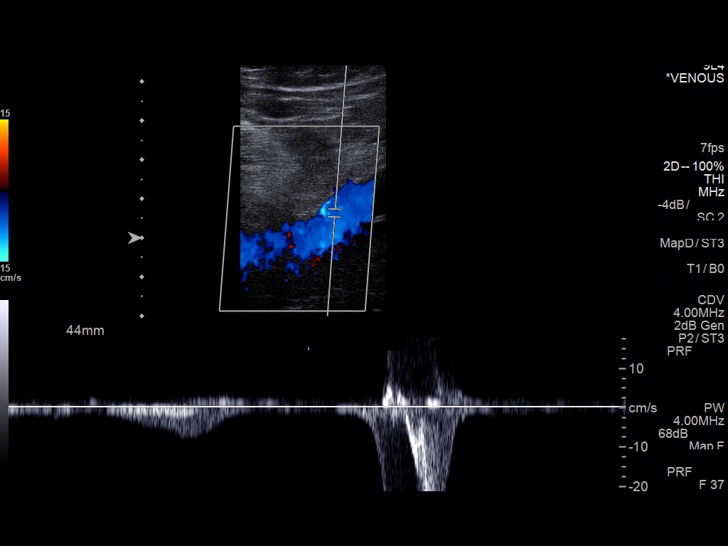

[14 of 24 positions shown; findings below may reference images not displayed]

FINDINGS: Normal compressibility of the common femoral, superficial femoral,
and popliteal veins, as well as the proximal calf veins. No filling
defects to suggest DVT on grayscale or color Doppler imaging.
Doppler waveforms show normal direction of venous flow, normal
respiratory phasicity and response to augmentation. Visualized
segments of the saphenous venous systems normal in caliber and
compressibility.
IMPRESSION: No evidence of  lower extremity deep vein thrombosis, BILATERALLY.

## 2018-06-23 ENCOUNTER — Other Ambulatory Visit: Payer: Self-pay

## 2018-06-23 ENCOUNTER — Encounter (HOSPITAL_COMMUNITY): Payer: Self-pay

## 2018-06-23 ENCOUNTER — Ambulatory Visit (HOSPITAL_COMMUNITY): Payer: Managed Care, Other (non HMO)

## 2018-06-23 ENCOUNTER — Ambulatory Visit (HOSPITAL_COMMUNITY)
Admission: RE | Admit: 2018-06-23 | Discharge: 2018-06-23 | Disposition: A | Discharge status: Home / Self Care | Payer: Managed Care, Other (non HMO) | Source: Ambulatory Visit | Attending: Hematology | Admitting: Hematology

## 2018-06-23 ENCOUNTER — Telehealth: Payer: Self-pay | Admitting: *Deleted

## 2018-06-23 DIAGNOSIS — R918 Other nonspecific abnormal finding of lung field: Secondary | ICD-10-CM | POA: Diagnosis present

## 2018-06-23 NOTE — Telephone Encounter (Signed)
-----   Message from Truitt Merle, MD sent at 06/23/2018 11:44 AM EDT ----- Please let pt know his CT chest results, tiny lung nodules are stable, likely benign, no need f/u. Small left supraclavicular nodes are slightly enlarged, will watch it clinically. 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

## 2018-06-23 NOTE — Telephone Encounter (Signed)
Spoke with pt and informed him of CT chest results as per Dr. Burr Medico.   Pt understood to monitor for any changes of [place] node.   Pt wanted to know if Dr. Burr Medico will order a repeat Chest CT in 3 months as suggested by radiolgist. Informed pt that he will be contacted with further instructions when MD returns in office tomorrow.

## 2018-06-23 NOTE — Telephone Encounter (Signed)
Spoke with pt and informed pt of Dr. Ernestina Penna response about repeated chest CT in  3 months.  Pt voiced understanding.

## 2018-06-23 NOTE — Telephone Encounter (Signed)
No I do not suggest repeat CT chest in 3 months, the [place] nodes maybe the same process of his abdominal adenopathy, I think it's OK to monitor clinically, to avoid more radiation from CT.   Truitt Merle MD

## 2018-06-24 ENCOUNTER — Ambulatory Visit (HOSPITAL_COMMUNITY): Payer: Managed Care, Other (non HMO)

## 2018-06-25 ENCOUNTER — Ambulatory Visit (HOSPITAL_COMMUNITY): Admission: RE | Admit: 2018-06-25 | Payer: Managed Care, Other (non HMO) | Source: Ambulatory Visit

## 2018-06-25 NOTE — Progress Notes (Signed)
Virtual Visit via Telephone Note  I connected with Julian Washington on 06/26/18 at  1:30 PM EDT by telephone and verified that I am speaking with the correct person using two identifiers.  Location: Patient: Home Provider: Office [facility] - [address], [place], [place], [place] [address]   I discussed the limitations, risks, security and privacy concerns of performing an evaluation and management service by telephone and the availability of in person appointments. I also discussed with the patient that there may be a patient responsible charge related to this service. The patient expressed understanding and agreed to proceed.  Patient consented to consult via telephone: Yes People present and their role in pt care: Pt     History of Present Illness: 38 year old male followed in our office for moderate osa  Pt of Dr. Halford Chessman   Chief complaint: OSA   38 year old male never smoker followed in our office for moderate obstructive sleep apnea.  Patient reports that things have been going well with his CPAP he has no current complaints or issues.  CPAP compliance report shows excellent compliance see compliance report listed below:   05/26/2018-06/25/2018-CPAP compliance report-30 in the last 30 days use, all 30 those days greater than 4 hours, average usage 7 hours and 14 minutes, CPAP set pressure of 15, AHI 0.5  Patient also reports he recently completed a CT without contrast of his chest.  Dr. Burr Medico has been following him for some lymphadenopathy.  CT chest did show some small left supraclavicular nodes that are slightly enlarged.  Patient reports that this will be watched clinically and will have a repeat CT in 1 year.  Observations/Objective:  06/23/2018-CT chest without contrast- several small left supraclavicular lymph nodes measuring up to 11 mm with soft tissue stranding and adjacent fat, differential diagnosis includes reactive lymphadenopathy, lymphoproliferative disorder, and  less likely metastatic disease, recommend continued CT follow-up in 3 months, stable subcentimeter bilateral pulmonary nodules, no follow-up needed if patient is low risk >>> Being followed by Dr. Burr Medico, watching small left supraclavicular nodes clinically,  Sleep tests: HST 12/09/09 ([facility]) >> AHI 27.3, SaO2 low 43%  Cardiac tests: Echo 03/20/16 >> EF 55 to 60%, mild LVH  Assessment and Plan:  OSA on CPAP Assessment: 2011 home sleep study shows moderate OSA, AHI 27.3 CPAP compliance report today shows excellent compliance Last weight in chart 386 pounds  Plan: Continue CPAP therapy Follow-up with our office in 1 year  Abnormal finding on lung imaging Assessment: May/2020 CT chest without contrast shows small left supraclavicular lymph nodes measuring up to 11 mm May/2020 CT chest without contrast shows multiple small pulmonary nodules largest being 5 mm Patient is never smoker  Plan: Patient reporting today that Dr. Burr Medico will repeat a CT in 1 year to follow this lymphadenopathy Patient and I discussed today that if patient has increased anxiety regarding waiting 1 year for CT imaging he can follow-up with Dr. Burr Medico to discuss repeating CT sooner than 1 year Continue to monitor symptoms clinically Notify Dr. Burr Medico of abnormal weight loss, fatigue, hemoptysis, or worsened acute symptoms   Follow Up Instructions:  Return in about 1 year (around 06/26/2019), or if symptoms worsen or fail to improve, for Follow up with Dr. Halford Chessman, Follow up with [name] FNP-C.   I discussed the assessment and treatment plan with the patient. The patient was provided an opportunity to ask questions and all were answered. The patient agreed with the plan  and demonstrated an understanding of the instructions.   The patient was advised to call back or seek an in-person evaluation if the symptoms worsen or if the condition fails to improve as anticipated.  I provided 12 minutes of  non-face-to-face time during this encounter.   Lauraine Rinne, NP

## 2018-06-26 ENCOUNTER — Encounter: Payer: Self-pay | Admitting: Pulmonary Disease

## 2018-06-26 ENCOUNTER — Other Ambulatory Visit: Payer: Self-pay

## 2018-06-26 ENCOUNTER — Ambulatory Visit (INDEPENDENT_AMBULATORY_CARE_PROVIDER_SITE_OTHER): Payer: Managed Care, Other (non HMO) | Admitting: Pulmonary Disease

## 2018-06-26 DIAGNOSIS — Z9989 Dependence on other enabling machines and devices: Secondary | ICD-10-CM | POA: Diagnosis not present

## 2018-06-26 DIAGNOSIS — R918 Other nonspecific abnormal finding of lung field: Secondary | ICD-10-CM | POA: Insufficient documentation

## 2018-06-26 DIAGNOSIS — G4733 Obstructive sleep apnea (adult) (pediatric): Secondary | ICD-10-CM | POA: Diagnosis not present

## 2018-06-26 NOTE — Assessment & Plan Note (Signed)
Assessment: May/2020 CT chest without contrast shows small left supraclavicular lymph nodes measuring up to 11 mm May/2020 CT chest without contrast shows multiple small pulmonary nodules largest being 5 mm Patient is never smoker  Plan: Patient reporting today that Dr. Burr Medico will repeat a CT in 1 year to follow this lymphadenopathy Patient and I discussed today that if patient has increased anxiety regarding waiting 1 year for CT imaging he can follow-up with Dr. Burr Medico to discuss repeating CT sooner than 1 year Continue to monitor symptoms clinically Notify Dr. Burr Medico of abnormal weight loss, fatigue, hemoptysis, or worsened acute symptoms

## 2018-06-26 NOTE — Assessment & Plan Note (Signed)
Assessment: 2011 home sleep study shows moderate OSA, AHI 27.3 CPAP compliance report today shows excellent compliance Last weight in chart 386 pounds  Plan: Continue CPAP therapy Follow-up with our office in 1 year

## 2018-06-26 NOTE — Patient Instructions (Addendum)
Great job with your CPAP   We will send an order to your DME company for your supplies as well as replacement mask if needed  We recommend that you continue using your CPAP daily >>>Keep up the hard work using your device >>> Goal should be wearing this for the entire night that you are sleeping, at least 4 to 6 hours  Remember:  . Do not drive or operate heavy machinery if tired or drowsy.  . Please notify the supply company and office if you are unable to use your device regularly due to missing supplies or machine being broken.  . Work on maintaining a healthy weight and following your recommended nutrition plan  . Maintain proper daily exercise and movement  . Maintaining proper use of your device can also help improve management of other chronic illnesses such as: Blood pressure, blood sugars, and weight management.   BiPAP/ CPAP Cleaning:  >>>Clean weekly, with Dawn soap, and bottle brush.  Set up to air dry.    Return in about 1 year (around 06/26/2019), or if symptoms worsen or fail to improve, for Follow up with Dr. Halford Chessman, Follow up with [name] FNP-C.  Coronavirus (COVID-19) Are you at risk?  Are you at risk for the Coronavirus (COVID-19)?  To be considered HIGH RISK for Coronavirus (COVID-19), you have to meet the following criteria:  . Traveled to [place], [place], [place], [place] or [place]; or in the [place] to [place], [place], [place], or [place]; and have fever, cough, and shortness of breath within the last 2 weeks of travel OR . Been in close contact with a person diagnosed with COVID-19 within the last 2 weeks and have fever, cough, and shortness of breath . IF YOU DO NOT MEET THESE CRITERIA, YOU ARE CONSIDERED LOW RISK FOR COVID-19.  What to do if you are HIGH RISK for COVID-19?  Marland Kitchen If you are having a medical emergency, call 911. . Seek medical care right away. Before you go to a doctor's office, urgent care or emergency department, call ahead and  tell them about your recent travel, contact with someone diagnosed with COVID-19, and your symptoms. You should receive instructions from your physician's office regarding next steps of care.  . When you arrive at healthcare provider, tell the healthcare staff immediately you have returned from visiting [place], [place], [place], [place] or [place]; or traveled in the [place] to [place], [place], [place], or [place]; in the last two weeks or you have been in close contact with a person diagnosed with COVID-19 in the last 2 weeks.   . Tell the health care staff about your symptoms: fever, cough and shortness of breath. . After you have been seen by a medical provider, you will be either: o Tested for (COVID-19) and discharged home on quarantine except to seek medical care if symptoms worsen, and asked to  - Stay home and avoid contact with others until you get your results (4-5 days)  - Avoid travel on public transportation if possible (such as bus, train, or airplane) or o Sent to the Emergency Department by EMS for evaluation, COVID-19 testing, and possible admission depending on your condition and test results.  What to do if you are LOW RISK for COVID-19?  Reduce your risk of any infection by using the same precautions used for avoiding the common cold or flu:  Marland Kitchen Wash your hands often with soap and warm water for at least  20 seconds.  If soap and water are not readily available, use an alcohol-based hand sanitizer with at least 60% alcohol.  . If coughing or sneezing, cover your mouth and nose by coughing or sneezing into the elbow areas of your shirt or coat, into a tissue or into your sleeve (not your hands). . Avoid shaking hands with others and consider head nods or verbal greetings only. . Avoid touching your eyes, nose, or mouth with unwashed hands.  . Avoid close contact with people who are sick. . Avoid places or events with large numbers of people in one location, like  concerts or sporting events. . Carefully consider travel plans you have or are making. . If you are planning any travel outside or inside the [place], visit the CDC's Travelers' Health webpage for the latest health notices. . If you have some symptoms but not all symptoms, continue to monitor at home and seek medical attention if your symptoms worsen. . If you are having a medical emergency, call 911.   [place] / e-Visit: [email or web address]         [facility]: [facility]: [phone]                   [facility]: [phone]           It is flu season:   >>> Best ways to protect herself from the flu: Receive the yearly flu vaccine, practice good hand hygiene washing with soap and also using hand sanitizer when available, eat a nutritious meals, get adequate rest, hydrate appropriately   Please contact the office if your symptoms worsen or you have concerns that you are not improving.   Thank you for choosing [facility] for your healthcare, and for allowing [place] to partner with you on your healthcare journey. I am thankful to be able to provide care to you today.   [name] FNP-C

## 2018-07-15 ENCOUNTER — Other Ambulatory Visit: Payer: Self-pay | Admitting: Endocrinology

## 2018-09-04 ENCOUNTER — Ambulatory Visit: Payer: Managed Care, Other (non HMO) | Admitting: Internal Medicine

## 2018-09-22 NOTE — Progress Notes (Signed)
Reviewed and agree with assessment/plan.   Lizmary Nader, MD [facility] 02/07/2016, 12:24 PM Pager:  [phone]  

## 2018-10-01 ENCOUNTER — Encounter: Payer: Managed Care, Other (non HMO) | Admitting: Internal Medicine

## 2018-10-05 ENCOUNTER — Telehealth: Payer: Self-pay | Admitting: Internal Medicine

## 2018-10-05 NOTE — Telephone Encounter (Signed)
Called pt to schedule CPE left msg

## 2018-10-12 ENCOUNTER — Telehealth: Payer: Self-pay | Admitting: Internal Medicine

## 2018-10-12 DIAGNOSIS — E119 Type 2 diabetes mellitus without complications: Secondary | ICD-10-CM

## 2018-10-12 DIAGNOSIS — Z0000 Encounter for general adult medical examination without abnormal findings: Secondary | ICD-10-CM

## 2018-10-12 MED ORDER — VERAPAMIL HCL ER 240 MG PO TBCR: 240.0000 mg | EXTENDED_RELEASE_TABLET | Freq: Every day | ORAL | 0 refills | Status: DC

## 2018-10-12 MED ORDER — FUROSEMIDE 20 MG PO TABS: 20.0000 mg | ORAL_TABLET | Freq: Every day | ORAL | 0 refills | Status: DC

## 2018-10-12 NOTE — Telephone Encounter (Signed)
Okay to refill medications x6 months. Labs: CMP, FLP, A1c --- DX CPX and DM

## 2018-10-12 NOTE — Telephone Encounter (Signed)
Orders placed. Julian Washington- okay to schedule fasting lab appt several days before his CPX.

## 2018-10-12 NOTE — Telephone Encounter (Signed)
Called PT no answer will try to call again on tomorrow

## 2018-10-12 NOTE — Telephone Encounter (Signed)
Pt has rescheduled his CPE for 9.30.20 and will need a refill until his appt because he will run out before that date/ Pt also would like to come in to do labs in the morning at some point ahead of his CPE appt due to only afternoons appts being available / please advise   Medication Refill - Medication: furosemide (LASIX) 20 MG tablet  verapamil (CALAN-SR) 240 MG CR tablet   Has the patient contacted their pharmacy? No. (Agent: If no, request that the patient contact the pharmacy for the refill.) (Agent: If yes, when and what did the pharmacy advise?)  Preferred Pharmacy (with phone number or street name):  [facility] [address] [place] [phone] (Phone) [phone] (Fax)     Agent: Please be advised that RX refills may take up to 3 business days. We ask that you follow-up with your pharmacy.

## 2018-10-12 NOTE — Telephone Encounter (Signed)
Please advise regarding labs. Rx's sent.

## 2018-10-13 NOTE — Telephone Encounter (Signed)
Done

## 2018-11-02 ENCOUNTER — Encounter: Payer: Self-pay | Admitting: Internal Medicine

## 2018-11-04 ENCOUNTER — Other Ambulatory Visit: Payer: Self-pay

## 2018-11-04 ENCOUNTER — Other Ambulatory Visit (INDEPENDENT_AMBULATORY_CARE_PROVIDER_SITE_OTHER): Payer: Managed Care, Other (non HMO)

## 2018-11-04 DIAGNOSIS — Z0000 Encounter for general adult medical examination without abnormal findings: Secondary | ICD-10-CM

## 2018-11-04 DIAGNOSIS — E119 Type 2 diabetes mellitus without complications: Secondary | ICD-10-CM | POA: Diagnosis not present

## 2018-11-04 LAB — COMPREHENSIVE METABOLIC PANEL
ALT: 17 U/L (ref 0–53)
AST: 13 U/L (ref 0–37)
Albumin: 4.2 g/dL (ref 3.5–5.2)
Alkaline Phosphatase: 72 U/L (ref 39–117)
BUN: 18 mg/dL (ref 6–23)
CO2: 28 mEq/L (ref 19–32)
Calcium: 9.1 mg/dL (ref 8.4–10.5)
Chloride: 103 mEq/L (ref 96–112)
Creatinine, Ser: 0.89 mg/dL (ref 0.40–1.50)
GFR: 95.87 mL/min (ref >60.00)
Glucose, Bld: 114 mg/dL — ABNORMAL HIGH (ref 70–99)
Potassium: 4.4 mEq/L (ref 3.5–5.1)
Sodium: 139 mEq/L (ref 135–145)
Total Bilirubin: 0.5 mg/dL (ref 0.2–1.2)
Total Protein: 6.4 g/dL (ref 6.0–8.3)

## 2018-11-04 LAB — MICROALBUMIN / CREATININE URINE RATIO
Creatinine,U: 136.9 mg/dL
Microalb Creat Ratio: 5 mg/g (ref 0.0–30.0)
Microalb, Ur: 6.8 mg/dL — ABNORMAL HIGH (ref 0.0–1.9)

## 2018-11-04 LAB — LIPID PANEL
Cholesterol: 119 mg/dL (ref 0–200)
HDL: 36.9 mg/dL — ABNORMAL LOW (ref >39.00)
LDL Cholesterol: 66 mg/dL (ref 0–99)
NonHDL: 82.06
Total CHOL/HDL Ratio: 3
Triglycerides: 80 mg/dL (ref 0.0–149.0)
VLDL: 16 mg/dL (ref 0.0–40.0)

## 2018-11-04 LAB — HEMOGLOBIN A1C: Hgb A1c MFr Bld: 6.5 % (ref 4.6–6.5)

## 2018-11-10 ENCOUNTER — Other Ambulatory Visit: Payer: Self-pay

## 2018-11-11 ENCOUNTER — Encounter: Payer: Self-pay | Admitting: Internal Medicine

## 2018-11-11 ENCOUNTER — Other Ambulatory Visit: Payer: Self-pay

## 2018-11-11 ENCOUNTER — Ambulatory Visit (INDEPENDENT_AMBULATORY_CARE_PROVIDER_SITE_OTHER): Payer: Managed Care, Other (non HMO) | Admitting: Internal Medicine

## 2018-11-11 VITALS — BP 129/72 | HR 93 | Temp 97.2°F | Resp 16 | Ht 71.0 in | Wt 382.4 lb

## 2018-11-11 DIAGNOSIS — R161 Splenomegaly, not elsewhere classified: Secondary | ICD-10-CM | POA: Diagnosis not present

## 2018-11-11 DIAGNOSIS — Z23 Encounter for immunization: Secondary | ICD-10-CM

## 2018-11-11 DIAGNOSIS — Z0000 Encounter for general adult medical examination without abnormal findings: Secondary | ICD-10-CM

## 2018-11-11 NOTE — Progress Notes (Signed)
Subjective:    Patient ID: Julian Washington, male    DOB: 01/28/1981, 38 y.o.   MRN: [id number]  DOS:  11/11/2018 Type of visit - description: CPX Since the last office visit, he is doing about the same. Went to see hematology, Endo, pulmonary, notes reviewed. Recently had cellulitis in the left leg, was seen virtually, prescribed antibiotics, doing better.  Wt Readings from Last 3 Encounters:  11/11/18 (!) 382 lb 6 oz (173.4 kg)  04/30/18 (!) 386 lb (175.1 kg)  04/10/18 (!) 392 lb 12.8 oz (178.2 kg)     Review of Systems Stress is very high but anxiety is relatively controlled. Edema increase today, he thinks is because has been working 3 days in a row on his feet.  He anticipate is going to be better now that he has 2 or 3 days off  Other than above, a 14 point review of systems is negative    Past Medical History:  Diagnosis Date  . ADHD   . Anemia   . Anxiety and depression    h/o suicidality w/ citalopram  . Diabetes mellitus without complication ([place])   . Difficult intubation   . ETD (eustachian tube dysfunction)    s/p ENT, declined ear tuves before   . GERD (gastroesophageal reflux disease)   . History of chicken pox    Titered on 08/16/2005  . Hypogonadism male    Dr Cruzita Lederer, used to see urology  . Infertility male   . Internal hemorrhoids    s/p banding  . Low testosterone   . Migraine    on topamax  . OSA on CPAP   . Palpitations   . 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[place])    h/o increased prolactin, Dr Cruzita Lederer (previously @ [facility])  . Prediabetes    A1C 6.2 years ago  . 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 Vitamin B 12 deficiency    h/o  . Vitamin D deficiency     Past Surgical History:  Procedure Laterality Date  . 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 TONSILLECTOMY AND ADENOIDECTOMY      Social History   Socioeconomic History  . Marital status: Married    Spouse name: Not on file  . Number of children: 0  . Years of  education: Not on file  . Highest education level: Not on file  Occupational History  . Occupation: Therapist, sports, [employer] short stay    Employer: [employer]  Social Needs  . Financial resource strain: Not on file  . Food insecurity    Worry: Not on file    Inability: Not on file  . Transportation needs    Medical: Not on file    Non-medical: Not on file  Tobacco Use  . Smoking status: Never Smoker  . Smokeless tobacco: Never Used  Substance and Sexual Activity  . Alcohol use: Yes    Alcohol/week: 0.0 standard drinks    Comment: occ  . Drug use: No  . Sexual activity: Not on file  Lifestyle  . Physical activity    Days per week: Not on file    Minutes per session: Not on file  . Stress: Not on file  Relationships  . Social Herbalist on phone: Not on file    Gets together: Not on file    Attends religious service: Not on file    Active member of club or organization: Not on file  Attends meetings of clubs or organizations: Not on file    Relationship status: Not on file  . Intimate partner violence    Fear of current or ex partner: Not on file    Emotionally abused: Not on file    Physically abused: Not on file    Forced sexual activity: Not on file  Other Topics Concern  . Not on file  Social History Narrative   Lives w/ wife     Family History  Problem Relation Age of Onset  . Obesity Mother   . Depression Father   . Anxiety disorder Father   . Bipolar disorder Father   . Drug abuse Father   . Cancer Maternal Grandfather        GBM   . Diabetes Paternal Grandmother   . Hypertension Paternal Grandmother   . Diabetes Paternal Grandfather   . Cancer Paternal Grandfather        prostate cancer  . Prostate cancer Other        GF  . CAD Other        PGF  . Colon cancer Neg Hx      Allergies as of 11/11/2018      Reactions   Levaquin [levofloxacin In D5w] Swelling   Celexa [citalopram] Other (See Comments)   Mental status changes    Dextrans Other (See  Comments)   Makes the pt. Drowsy.    Hydrocodone-acetaminophen    REACTION: itching   Latex    Oxycodone Itching      Medication List       Accurate as of November 11, 2018  5:34 PM. If you have any questions, ask your nurse or doctor.        Accu-Chek FastClix Lancets Misc 1 Package by Does not apply route 2 (two) times daily.   atomoxetine 100 MG capsule Commonly known as: STRATTERA   Concerta 18 MG CR tablet Generic drug: methylphenidate   fexofenadine 180 MG tablet Commonly known as: ALLEGRA Take 180 mg by mouth daily.   fluticasone 50 MCG/ACT nasal spray Commonly known as: FLONASE Place 2 sprays into both nostrils daily.   furosemide 20 MG tablet Commonly known as: LASIX Take 1 tablet (20 mg total) by mouth daily.   glucose blood test strip Commonly known as: Accu-Chek Guide 1 each by Other route 2 (two) times daily. Use as instructed   MAG-OXIDE PO Take 500 mg by mouth daily.   metFORMIN 1000 MG (MOD) 24 hr tablet Commonly known as: GLUMETZA TAKE 2 TABLETS (2,000 MG TOTAL) DAILY   multivitamin tablet Take 1 tablet by mouth daily.   pantoprazole 40 MG tablet Commonly known as: PROTONIX Take 1 tablet (40 mg total) by mouth daily.   verapamil 240 MG CR tablet Commonly known as: CALAN-SR Take 1 tablet (240 mg total) by mouth at bedtime.           Objective:   Physical Exam BP 129/72 (BP Location: Left Arm, Patient Position: Sitting, Cuff Size: Normal)   Pulse 93   Temp (!) 97.2 F (36.2 C) (Temporal)   Resp 16   Ht 5\' 11"  (1.803 m)   Wt (!) 382 lb 6 oz (173.4 kg)   SpO2 100%   BMI 53.33 kg/m  General: Well developed, NAD, BMI noted Neck: No  thyromegaly  HEENT:  Normocephalic . Face symmetric, atraumatic Lungs:  CTA B Normal respiratory effort, no intercostal retractions, no accessory muscle use. Heart: RRR,  no murmur.  Lower extremity  edema: Slightly more than before Abdomen:  Not distended, soft, non-tender. No rebound or  rigidity.   Skin: Patches of dry, scaly, erythematous skin at the pretibial area.  No warmness or tenderness. Neurologic:  alert & oriented X3.  Speech normal, gait appropriate for age and unassisted Strength symmetric and appropriate for age.  Psych: Cognition and judgment appear intact.  Cooperative with normal attention span and concentration.  Behavior appropriate. No anxious or depressed appearing.     Assessment     Assessment   Prediabetes-- on metformin x years  PSYCH: --Anxiety depression (suicidality w/  Citalopram) --ADHD Dx 2019, Dr Johnnye Sima Morbid obesity Vitamin D and  B12 deficiency Endocrinology:Dr Gherghe --Pituitary mass, surgery : Prolactinoma --Hypogonadism hypogonadotropic --Infertility Migraines, d/c Topamax 05-2015 (pt concerned about short term memory), started verapamil OSA on CPAP -- DR Halford Chessman Hematology -Mild chronic anemia, no previous colonoscopy or EGD. No symptoms. Iron , vitamins normal.  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   He is aware of obesity  risks. pituitary mass, hypogonadism: Saw endocrinology 04/20/2018, note reviewed, they were holding testosterone treatment for now. 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 Mesenteric adenopathy: Saw hematology 03-2018.  They were considering BX, recommended to repeat a CT in 1 year Splenomegaly: No further eval needed when he saw hematology 03-2018 except avoid injury to the area Pulmonary nodules: Hematology note 03-2018,  planning to redo a CT chest. DM: Out of metformin temporarily due to a recall.  Last hemoglobin A1c 6.5. Dermatitis, lower extremities: Was recommended by dermatology to change soaps.  They look at baseline today. RTC 1 year

## 2018-11-11 NOTE — Assessment & Plan Note (Addendum)
-  Tdap 2015 - PNM 23: today - flu shot at work - CCS: no FH . Never had a cscope   -recent labs reviewed  - diet,exercise discussed, at this point is not able to change his lifestyle due to busy schedule.

## 2018-11-11 NOTE — Assessment & Plan Note (Signed)
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   He is aware of obesity  risks. pituitary mass, hypogonadism: Saw endocrinology 04/20/2018, note reviewed, they were holding testosterone treatment for now. 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 Mesenteric adenopathy: Saw hematology 03-2018.  They were considering BX, recommended to repeat a CT in 1 year Splenomegaly: No further eval needed when he saw hematology 03-2018 except avoid injury to the area Pulmonary nodules: Hematology note 03-2018, planning to redo a CT chest. DM: Out of metformin temporarily due to a recall.  Last hemoglobin A1c 6.5. Dermatitis, lower extremities: Was recommended by dermatology to change soaps.  They look at baseline today. RTC 1 year

## 2018-11-11 NOTE — Progress Notes (Signed)
Pre visit review using our clinic review tool, if applicable. No additional management support is needed unless otherwise documented below in the visit note. 

## 2018-11-11 NOTE — Patient Instructions (Signed)
   GO TO THE FRONT DESK Schedule your next appointment for a physical exam in 1 year  Please call sooner if you need me

## 2018-11-23 ENCOUNTER — Telehealth: Payer: Self-pay | Admitting: *Deleted

## 2018-11-23 NOTE — Telephone Encounter (Signed)
"  Noticed lymph nodes enlarged last week associated with flu vaccine received week before last but getting worse.  No fever.  No breathing problems or changes.  No further lymph nodes enlarged or any abdominal pain."

## 2018-11-23 NOTE — Telephone Encounter (Signed)
I called him back, he had pneumonia vaccine on left arm on 11/11/2018, and started noticing bilateral axillary adenopathy last week, more on the right, with discomfort and slightly pain when he moves right arm.  No fever, chills, or other new symptoms.  He is still able to function and work as usual, no limitation on his activities.  I think this is still possible related to the pneumonia vaccine, will watch for additional 1-2 weeks, if no improvement, I will see him back in my clinic.  He knows to call us if symptoms get worse. He agrees with the plan and appreciated the call.   Truitt Merle MD

## 2018-11-23 NOTE — Telephone Encounter (Signed)
"  Julian Washington.  I can be reached directly, [phone]); tell secretary you need to speak to me, it doesn't need to wait to locate me.    Dr. Burr Medico told me to call about this.  I need to seen about being seen sooner than later.   Two lymph nodes under my right arm, enlarged 3/4 inch to 1 inch.   Affecting ability to move right arm somewhat.  Area is a little bit painful."

## 2018-12-03 ENCOUNTER — Ambulatory Visit (INDEPENDENT_AMBULATORY_CARE_PROVIDER_SITE_OTHER): Payer: Managed Care, Other (non HMO) | Admitting: Internal Medicine

## 2018-12-03 ENCOUNTER — Other Ambulatory Visit: Payer: Self-pay

## 2018-12-03 ENCOUNTER — Encounter: Payer: Self-pay | Admitting: Internal Medicine

## 2018-12-03 DIAGNOSIS — Z87898 Personal history of other specified conditions: Secondary | ICD-10-CM

## 2018-12-03 DIAGNOSIS — E230 Hypopituitarism: Secondary | ICD-10-CM | POA: Diagnosis not present

## 2018-12-03 DIAGNOSIS — E119 Type 2 diabetes mellitus without complications: Secondary | ICD-10-CM

## 2018-12-03 NOTE — Patient Instructions (Signed)
Please continue Metformin 1000 mg daily.  Please come back for a follow-up appointment in 4 to 6 months.

## 2018-12-03 NOTE — Progress Notes (Signed)
Patient ID: Julian Washington, male   DOB: Feb 10, 1981, 38 y.o.   MRN: [id number]  Patient location: Home My location: Office  Referring Provider: Colon Branch, MD  I connected with the patient on 12/03/18 at 10:08 AM EDT by a video enabled telemedicine application and verified that I am speaking with the correct person.   I discussed the limitations of evaluation and management by telemedicine and the availability of in person appointments. The patient expressed understanding and agreed to proceed.   Details of the encounter are shown below.  HPI: FENG DEVICH is a 38 y.o.-year-old man, presenting for f/u for hypogonadotropic hypogonadism, history of pituitary adenoma, s/p TSR, and DM2, non-insulin-dependent. Last visit 7 months ago.  At previous visits, he and his wife are preparing for IVF and waiting for the insurance to cover this.  Also, his wife's diabetes had to improve before the procedure.  Therefore, we did not start him on testosterone.  He was diagnosed with ADHD in 12/2017.  Before last visit, he started Concerta and Strattera and he felt that these helped with his appetite.  He lost about 401 to 386 pounds in 2 months before last visit.  He lost 4 more pounds since last visit.  He was previously seeing Dr. Leafy Ro in the weight loss clinic.  He was on Victoza when he was seen her.  She was found to have enlarged LNs in his abdomen and chest - not amenable to FNA without endoscopy and laparoscopy. He will have a repeat CT in 03/2019. He sees Dr. Burr Medico.  History of pituitary adenoma: Reviewed history:  The patient has been diagnosed with hypogonadotropic hypogonadism ~2012, after trying to achieve a pregnancy. He tried testosterone replacement and clomiphene (which did not work), a cousin who is a medical resident suggested to have further investigation to find out the cause for his hypogonadism. His urologist at that time ordered a pituitary MRI and he has been found to have a  pituitary microadenoma on MRI in 12/2011. He was referred to neurosurgery - Dr Luther Hearing - and he ended up having TSR in 02/2012. Surgical pathology showed:  The sections show monomorphic nests of neoplastic cells with neuroendocrine nuclear features, consistent with a pituitary adenoma. The tumor is strongly and diffusely immunoreactive for prolactin (100% of cells). There is patchy staining for adrenocorticotropic hormone (less than 5% of cells). The cells are negative for TSH, HGH, FSH, and beta-LH.   His highest prolactin level was 44.7 before the surgery. This has decreased after the surgery.  I reviewed patient's pertinent labs per records from [facility]: 11/2010: TSH 2 02/2011: Testosterone 0.89, free testosterone 2.2 (9.3-26.5) 11/17/2011: TSH 2.35, FSH 2.08, LH 1.44, testosterone 0.08 (2.41-8.27) 12/17/2011: Prolactin 44.7, estradiol 23, cortisol 2.9, hemoglobin A1c 6.2% 02/19/2012: TSH 1.9, total T3 106, free T4 1, hemoglobin A1c 5.8% 03/05/2012: Prolactin 1.4, cortisol 37.2, hemoglobin A1c 5.6%  04/02/2012: Prolactin 3.2, testosterone 79 [phone]), hemoglobin A1c 6.1%  06/19/2012: Total testosterone 119 [phone]), free testosterone 23.5 (40-224), SHBG 17 (10-50), LH  3.4, FSH 4.7, ACTH 20, cortisol 6.5, TSH 3.072, free T4 0.9 09/23/2012: Total testosterone 115, free testosterone 20.5, SHBG 20 04/02/2013: Total testosterone 76, free testosterone 15.6, SHBG 15  04/26/2013: TSH 2.34, free T4 0.9  Pituitary MRI 12/28/2011: Heterogeneous signal intensity lesion centered in the anterior aspect of the sella measures approximately 1 cm in maximum dimension and demonstrates a fluid hematocrit level with some intrinsic T1 bright/T2 dark signal  along its caudal margin consistent with subacute hemorrhage. There is no mass effect on the optic chiasm or evidence of cavernous sinus invasion.  Pituitary MRI 09/2012: Right lateral sellar soft tissue,  likely residual pituitary 0.6 x 0.4 x 0.9 cm, homogeneous postcontrast enhancement, abuts medial aspect of the right cavernous ICA. No mass effect.  Pituitary MRI (10/05/2014): No residual tumor.  Pituitary MRI (09/25/2017): Postoperative changes from partial pituitary resection. No recurrent tumor seen. No change compared with September 28, 2014.  No headaches or visual field cuts at this visit  Hypogonadotropic hypogonadism:  Reviewed history: Diagnosed when he and his wife were trying to get pregnant. They are preparing for IVF at [facility] >> wife's diabetes is not under control and he needs to be between 6 and 7% for the procedure. They are seeing reproductive endocrinology: Dr Robbie Louis, MD.  He started to have low T sxs 2011-2012.  He was previously on Testosterone: axillar gel (did not like it as he was always fearing that he can transfer this to his wife) and inj (200 mg 2x a month). On this >> T level normalized, however he cannot do the injections himself. Clomid did not help (3 mo) >> lost capacity to ejaculate and got hot flushes when stopped.   He has decreased libido.  No difficulty obtaining but has pbs maintaining an erection No trauma to testes, testicular irradiation or surgery No h/o of mumps orchitis/h/o autoimmune ds. No h/o cryptorchidism He grew and went through puberty like his peers No shrinking of testes. + small testes (<5 ml) No incomplete/delayed sexual development     No breast discomfort/gynecomastia    No loss of body hair (axillary/pubic)/decreased need for shaving No height loss No abnormal sense of smell  No hot flushes No vision problems No worst HA of his life, but had HA No FH of hypogonadism/infertility  No FH of hemochromatosis or pituitary tumors No excessive weight gain or loss.  No chronic diseases No chronic pain. Not on opiates, does not take steroids.  No more than 2 drinks a day of alcohol at a time, and this is  rarely No anabolic steroids use No herbal medicines  No AI ds in his family, no FH of MS.  He does not have family history of early cardiac disease. GF with AMI at 38 y/o.   Reviewed his pituitary labs from the last 3 years: Low testosterone/LH/FSH and low IGF-I:  Component     Latest Ref Rng & Units 04/30/2018  Testosterone, Serum (Total)     ng/dL 33 (L)  % Free Testosterone     % 2.2  Free Testosterone, S     pg/mL 7.3 (L)  Sex Hormone Binding Globulin     nmol/L 21.4  IGF-I, LC/MS     53 - 331 ng/mL 49 (L)  Z-Score (Male)     -2.0 - 2 SD -2.1 (L)  FSH     1.4 - 18.1 mIU/ML 4.2  LH     1.50 - 9.30 mIU/mL 2.43  Cortisol, Plasma     ug/dL 7.5  C206 ACTH     6 - 50 pg/mL 20  Prolactin     2.0 - 18.0 ng/mL 5.7  TSH     0.35 - 4.50 uIU/mL 2.71  T4,Free(Direct)     0.60 - 1.60 ng/dL 0.87  Triiodothyronine,Free,Serum     2.3 - 4.2 pg/mL 4.0    Component     Latest Ref Rng & Units  02/10/2017  Testosterone, Serum (Total)     264-916 ng/dL 46 (L)  % Free Testosterone     % 1.4  Free Testosterone, S     52-280 pg/mL  6.4 (L)  Sex Hormone Binding Globulin     nmol/L 25.9  IGF-I, LC/MS     53 - 331 ng/mL 64  Z-Score (Male)     -2.0 - 2 SD -1.6  FSH     1.4 - 18.1 mIU/ML 3.4  LH     1.50 - 9.30 mIU/mL 2.65  Cortisol, Plasma     ug/dL 5.0  C206 ACTH     6 - 50 pg/mL 28  Prolactin     2.0 - 18.0 ng/mL 3.3  TSH     0.35 - 4.50 uIU/mL 2.71  T4,Free(Direct)     0.60 - 1.60 ng/dL 0.78  Triiodothyronine,Free,Serum     2.3 - 4.2 pg/mL 3.1    Component     Latest Ref Rng & Units 01/10/2016  Testosterone     264 - 916 ng/dL 29 (L)  Testosterone Free     8.7 - 25.1 pg/mL 1.6 (L)  Sex Horm Binding Glob, Serum     16.5 - 55.9 nmol/L 22.8  IGF-I, LC/MS     53 - 331 ng/mL 61  Z-Score (Male)     -2.0 - 2.0 SD -1.7  FSH     1.4 - 18.1 mIU/ML 3.7  LH     1.50 - 9.30 mIU/mL 2.76  T4,Free(Direct)     0.60 - 1.60 ng/dL 0.79  Triiodothyronine,Free,Serum      2.3 - 4.2 pg/mL 3.6  Cortisol, Plasma     ug/dL 4.8  C206 ACTH     6 - 50 pg/mL 18  TSH     0.35 - 4.50 uIU/mL 1.27  Prolactin     2.0 - 18.0 ng/mL 4.2   He was diagnosed with OSA in 2011.  She wears a CPAP machine. He was previously working night shifts, but now works days.  DM2:  Previously prediabetes, but he now has 2 elevated HbA1c levels: Lab Results  Component Value Date   HGBA1C 6.5 11/04/2018   HGBA1C 6.9 (H) 02/27/2018   HGBA1C 5.5 11/20/2016  Prev. 5.9%, prev. 6.2% and 6.3% (see above)    Chemistry      Component Value Date/Time   NA 139 11/04/2018 0809   NA 141 11/20/2016 0811   K 4.4 11/04/2018 0809   CL 103 11/04/2018 0809   CO2 28 11/04/2018 0809   BUN 18 11/04/2018 0809   BUN 18 11/20/2016 0811   CREATININE 0.89 11/04/2018 0809   CREATININE 0.80 04/08/2018 1328   CREATININE 0.73 01/19/2016 1624      Component Value Date/Time   CALCIUM 9.1 11/04/2018 0809   ALKPHOS 72 11/04/2018 0809   AST 13 11/04/2018 0809   AST 12 (L) 04/08/2018 1328   ALT 17 11/04/2018 0809   ALT 19 04/08/2018 1328   BILITOT 0.5 11/04/2018 0809   BILITOT 0.4 04/08/2018 1328     + History of dyslipidemia: Lab Results  Component Value Date   CHOL 119 11/04/2018   HDL 36.90 (L) 11/04/2018   LDLCALC 66 11/04/2018   TRIG 80.0 11/04/2018   CHOLHDL 3 11/04/2018   Last eye exam: 02/2018: No DR  She is on: -Metformin ER 1000 mg in the evening - stopped 2/2 recall ~1-1.5 mo ago He was also previously on Saxenda, now off after he stopped seeing  Dr. Leafy Ro  Obesity: She tried weight watchers in the past but reached a plateau so stopped. Previously also on a GLP-1 receptor agonist. Before last visit he started Concerta and Strattera, which were helping suppressing his appetite.  He also has a history of B12 deficiency and IDA.  ROS: Constitutional: no weight gain/no weight loss, no fatigue, no subjective hyperthermia, no subjective hypothermia Eyes: no blurry vision, no  xerophthalmia ENT: no sore throat, no nodules palpated in neck, no dysphagia, no odynophagia, no hoarseness Cardiovascular: no CP/no SOB/no palpitations/no leg swelling Respiratory: no cough/no SOB/no wheezing Gastrointestinal: no N/no V/no D/no C/no acid reflux Musculoskeletal: no muscle aches/no joint aches Skin: no rashes, no hair loss Neurological: no tremors/no numbness/no tingling/no dizziness  I reviewed pt's medications, allergies, PMH, social hx, family hx, and changes were documented in the history of present illness. Otherwise, unchanged from my initial visit note.   Past Medical History:  Diagnosis Date  . ADHD   . Anemia   . Anxiety and depression    h/o suicidality w/ citalopram  . Diabetes mellitus without complication ([place])   . Difficult intubation   . ETD (eustachian tube dysfunction)    s/p ENT, declined ear tuves before   . GERD (gastroesophageal reflux disease)   . History of chicken pox    Titered on 08/16/2005  . Hypogonadism male    Dr Cruzita Lederer, used to see urology  . Infertility male   . Internal hemorrhoids    s/p banding  . Low testosterone   . Migraine    on topamax  . OSA on CPAP   . Palpitations   . 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[place])    h/o increased prolactin, Dr Cruzita Lederer (previously @ [facility])  . Prediabetes    A1C 6.2 years ago  . 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 Vitamin B 12 deficiency    h/o  . Vitamin D deficiency    Past Surgical History:  Procedure Laterality Date  . 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 TONSILLECTOMY AND ADENOIDECTOMY     Social History   Socioeconomic History  . Marital status: Married    Spouse name: Not on file  . Number of children: 0  . Years of education: Not on file  . Highest education level: Not on file  Occupational History  . Occupation: Therapist, sports, [employer] short stay    Employer: [employer]  Social Needs  . Financial resource strain: Not on file  . Food insecurity    Worry:  Not on file    Inability: Not on file  . Transportation needs    Medical: Not on file    Non-medical: Not on file  Tobacco Use  . Smoking status: Never Smoker  . Smokeless tobacco: Never Used  Substance and Sexual Activity  . Alcohol use: Yes    Alcohol/week: 0.0 standard drinks    Comment: occ  . Drug use: No  . Sexual activity: Not on file  Lifestyle  . Physical activity    Days per week: Not on file    Minutes per session: Not on file  . Stress: Not on file  Relationships  . Social Herbalist on phone: Not on file    Gets together: Not on file    Attends religious service: Not on file    Active member of club or organization: Not on file    Attends meetings of clubs or organizations: Not  on file    Relationship status: Not on file  . Intimate partner violence    Fear of current or ex partner: Not on file    Emotionally abused: Not on file    Physically abused: Not on file    Forced sexual activity: Not on file  Other Topics Concern  . Not on file  Social History Narrative   Lives w/ wife   Current Outpatient Medications on File Prior to Visit  Medication Sig Dispense Refill  . ACCU-CHEK FASTCLIX LANCETS MISC 1 Package by Does not apply route 2 (two) times daily. 100 each 0  . atomoxetine (STRATTERA) 100 MG capsule     . CONCERTA 18 MG CR tablet     . fexofenadine (ALLEGRA) 180 MG tablet Take 180 mg by mouth daily.    . fluticasone (FLONASE) 50 MCG/ACT nasal spray Place 2 sprays into both nostrils daily.    . furosemide (LASIX) 20 MG tablet Take 1 tablet (20 mg total) by mouth daily. 90 tablet 0  . glucose blood (ACCU-CHEK GUIDE) test strip 1 each by Other route 2 (two) times daily. Use as instructed 100 each 0  . Magnesium Oxide (MAG-OXIDE PO) Take 500 mg by mouth daily.    . metFORMIN (GLUMETZA) 1000 MG (MOD) 24 hr tablet TAKE 2 TABLETS (2,000 MG TOTAL) DAILY (Patient not taking: Reported on 11/11/2018) 180 tablet 3  . Multiple Vitamin (MULTIVITAMIN)  tablet Take 1 tablet by mouth daily.     . pantoprazole (PROTONIX) 40 MG tablet Take 1 tablet (40 mg total) by mouth daily. 90 tablet 3  . verapamil (CALAN-SR) 240 MG CR tablet Take 1 tablet (240 mg total) by mouth at bedtime. 90 tablet 0   No current facility-administered medications on file prior to visit.    Allergies  Allergen Reactions  . Levaquin [Levofloxacin In D5w] Swelling  . Celexa [Citalopram] Other (See Comments)    Mental status changes   . Dextrans Other (See Comments)    Makes the pt. Drowsy.   . Hydrocodone-Acetaminophen     REACTION: itching  . Latex   . Oxycodone Itching   Family History  Problem Relation Age of Onset  . Obesity Mother   . Depression Father   . Anxiety disorder Father   . Bipolar disorder Father   . Drug abuse Father   . Cancer Maternal Grandfather        GBM   . Diabetes Paternal Grandmother   . Hypertension Paternal Grandmother   . Diabetes Paternal Grandfather   . Cancer Paternal Grandfather        prostate cancer  . Prostate cancer Other        GF  . CAD Other        PGF  . Colon cancer Neg Hx     PE: There were no vitals taken for this visit. There is no height or weight on file to calculate BMI. Wt Readings from Last 3 Encounters:  11/11/18 (!) 382 lb 6 oz (173.4 kg)  04/30/18 (!) 386 lb (175.1 kg)  04/10/18 (!) 392 lb 12.8 oz (178.2 kg)   Constitutional:  in NAD  The physical exam was not performed (virtual visit).  ASSESSMENT: 1. H/o Pituitary adenoma (prolactinoma) with pituitary apoplexy - status post TSR 02/2012  2. Hypogonadotropic hypogonadism  3. DM2  4. Obesity  PLAN:  1. History of pituitary adenoma -Patient has a history of a 1 cm pituitary adenoma he is status post transsphenoidal resection.  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 -We reviewed his pituitary labs from 2017 2020 and they were normal with the exception of the gonadotropin axis: Low testosterone, FSH, LH, but at last check, his  IGF-I was also low.  We did discuss about pluses and minuses of growth hormone.  He is now under investigation for enlarged lymph nodes with?  Lymphoma.  Growth hormone treatment would not be a good idea now.   -His MRI from 09/2017 (these are followed by Dr. Salomon Fick with neurosurgery) showed no recurrence -We will repeat his hormonal labs at next visit, in 04/2019  2. Hypogonadotropic hypogonadism  -He has a long history of hypogonadotropic hypogonadism but he and his wife are preparing for IVF so we did not start testosterone.  He tried Clomid in the past and this did not help.  He wanted to wait and only start testosterone after their IVF.  Also, his wife diabetes is to get under control.  3. DM2 -He had 2 HbA1c levels higher than 6.4%, which qualify him for type 2 diabetes, previously prediabetes. -He continues Metformin ER 1000 mg at night, started 2 months before our last visit.  He did well on this, however, he is now off for 1 to 1.5 months due to be called lots.  I advised him to get in touch with [facility] again to see if they acquired the medication from another pharmaceutical companies.  He will let me know.  I would suggest that he continues on Metformin ER. -He started to lose weight and I am hoping that his diabetes will improve along with weight loss  4. Obesity  -He continues to lose weight, but this has slowed down -He was seen Dr. Leafy Ro in the weight loss but he stopped -He is on ADHD medication and lost 15 pounds in 2 months prior to our last visit.  Since then, he lost 4 more. -At last visit we discussed about adding Victoza back but did not do so yet.  Philemon Kingdom, MD PhD [facility] Endocrinology

## 2019-01-02 ENCOUNTER — Encounter: Payer: Self-pay | Admitting: Hematology

## 2019-01-04 ENCOUNTER — Other Ambulatory Visit: Payer: Self-pay | Admitting: Hematology

## 2019-01-04 DIAGNOSIS — R591 Generalized enlarged lymph nodes: Secondary | ICD-10-CM

## 2019-01-04 DIAGNOSIS — R599 Enlarged lymph nodes, unspecified: Secondary | ICD-10-CM

## 2019-01-05 ENCOUNTER — Other Ambulatory Visit: Payer: Self-pay | Admitting: Hematology

## 2019-01-05 ENCOUNTER — Telehealth: Payer: Self-pay | Admitting: Hematology

## 2019-01-05 DIAGNOSIS — R591 Generalized enlarged lymph nodes: Secondary | ICD-10-CM

## 2019-01-05 DIAGNOSIS — R599 Enlarged lymph nodes, unspecified: Secondary | ICD-10-CM

## 2019-01-05 NOTE — Telephone Encounter (Signed)
I sent a Mychart message to pt.  Julian Washington

## 2019-01-10 ENCOUNTER — Other Ambulatory Visit: Payer: Self-pay | Admitting: Internal Medicine

## 2019-01-13 ENCOUNTER — Ambulatory Visit (HOSPITAL_COMMUNITY): Payer: Managed Care, Other (non HMO)

## 2019-01-29 ENCOUNTER — Encounter: Payer: Self-pay | Admitting: Hematology

## 2019-02-03 ENCOUNTER — Ambulatory Visit (HOSPITAL_COMMUNITY)
Admission: RE | Admit: 2019-02-03 | Discharge: 2019-02-03 | Disposition: A | Discharge status: Home / Self Care | Payer: Managed Care, Other (non HMO) | Source: Ambulatory Visit | Attending: Hematology | Admitting: Hematology

## 2019-02-03 ENCOUNTER — Other Ambulatory Visit: Payer: Self-pay

## 2019-02-03 ENCOUNTER — Encounter (HOSPITAL_COMMUNITY): Payer: Self-pay

## 2019-02-03 DIAGNOSIS — R591 Generalized enlarged lymph nodes: Secondary | ICD-10-CM | POA: Diagnosis present

## 2019-02-03 DIAGNOSIS — R599 Enlarged lymph nodes, unspecified: Secondary | ICD-10-CM

## 2019-02-11 ENCOUNTER — Encounter: Payer: Self-pay | Admitting: Hematology

## 2019-03-07 ENCOUNTER — Encounter: Payer: Self-pay | Admitting: Emergency Medicine

## 2019-03-07 ENCOUNTER — Emergency Department (INDEPENDENT_AMBULATORY_CARE_PROVIDER_SITE_OTHER)
Admission: EM | Admit: 2019-03-07 | Discharge: 2019-03-07 | Disposition: A | Discharge status: Home / Self Care | Payer: Managed Care, Other (non HMO) | Source: Home / Self Care

## 2019-03-07 ENCOUNTER — Other Ambulatory Visit: Payer: Self-pay

## 2019-03-07 DIAGNOSIS — H66004 Acute suppurative otitis media without spontaneous rupture of ear drum, recurrent, right ear: Secondary | ICD-10-CM

## 2019-03-07 DIAGNOSIS — H65192 Other acute nonsuppurative otitis media, left ear: Secondary | ICD-10-CM

## 2019-03-07 MED ORDER — METHYLPREDNISOLONE SODIUM SUCC 125 MG IJ SOLR
125.0000 mg | Freq: Once | INTRAMUSCULAR | Status: AC
  Administered 2019-03-07: 125 mg via INTRAMUSCULAR

## 2019-03-07 MED ORDER — AMOXICILLIN-POT CLAVULANATE 875-125 MG PO TABS: 1.0000 | ORAL_TABLET | Freq: Two times a day (BID) | ORAL | 0 refills | Status: DC

## 2019-03-07 MED ORDER — PREDNISONE 20 MG PO TABS: 40.0000 mg | ORAL_TABLET | Freq: Every day | ORAL | 0 refills | Status: AC

## 2019-03-07 NOTE — ED Triage Notes (Signed)
Patient c/o right ear pain since Thursday, taking some Ciprodex for his ear, no relief, now having severe right ear pain, right side of face hurting.

## 2019-03-07 NOTE — ED Provider Notes (Addendum)
[name]    CSN: [id number] Arrival date & time: 03/07/19  1023      History   Chief Complaint Chief Complaint  Patient presents with  . Otalgia    HPI Julian Washington is a 39 y.o. male.   HPI  Patient presents for evaluation of right ear pain. Patient has a history of ET dysfunction. Reports approximately 4 days ago ear pain started. He had on-hand Ciprodex which was previously prescribed by his ENT.  He has used Ciprodex as directed without improvement of pain. Pain is inside of right ear and he endorses radiation of pain to right side of neck. No fever, chills, or body aches. Patient has recently received COVID-19 vaccine. Past Medical History:  Diagnosis Date  . ADHD   . Anemia   . Anxiety and depression    h/o suicidality w/ citalopram  . Diabetes mellitus without complication ([place])   . Difficult intubation   . ETD (eustachian tube dysfunction)    s/p ENT, declined ear tuves before   . GERD (gastroesophageal reflux disease)   . History of chicken pox    Titered on 08/16/2005  . Hypogonadism male    Dr Cruzita Lederer, used to see urology  . Infertility male   . Internal hemorrhoids    s/p banding  . Low testosterone   . Migraine    on topamax  . OSA on CPAP   . Palpitations   . 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[place])    h/o increased prolactin, Dr Cruzita Lederer (previously @ [facility])  . Prediabetes    A1C 6.2 years ago  . 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 Vitamin B 12 deficiency    h/o  . Vitamin D deficiency     Patient Active Problem List   Diagnosis Date Noted  . Abnormal finding on lung imaging 06/26/2018  . ADHD 02/28/2018  . Chronic venous insufficiency of lower extremity 02/28/2018  . Psoriasis 02/24/2018  . Splenomegaly 09/24/2017  . 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  . Absolute anemia 12/04/2016  . Type 2 diabetes mellitus without complication, without long-term current use of insulin ([place]) 09/25/2016  . Morbid obesity ([place])  09/25/2016  . PCP NOTES >>> 11/15/2014  . H/O: pituitary tumor 01/28/2014  . Annual physical exam 07/13/2013  . Anxiety and depression   . Vitamin B 12 deficiency   . 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 Migraine   . OSA on CPAP   . Vitamin D deficiency   . Internal hemorrhoids   . GERD 06/02/2009    Past Surgical History:  Procedure Laterality Date  . 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 TONSILLECTOMY AND ADENOIDECTOMY         Home Medications    Prior to Admission medications   Medication Sig Start Date End Date Taking? Authorizing Provider  ACCU-CHEK FASTCLIX LANCETS MISC 1 Package by Does not apply route 2 (two) times daily. 04/23/17  Yes Alene Mires, Sahar M, PA-C  Ascorbic Acid (VITAMIN C PO) Take by mouth.   Yes [provider]  atomoxetine (STRATTERA) 100 MG capsule  02/18/18  Yes [provider]  Calcipotriene-Betameth Diprop (ENSTILAR EX) Apply topically.   Yes [provider]  CONCERTA 18 MG CR tablet  02/22/18  Yes [provider]  fexofenadine (ALLEGRA) 180 MG tablet Take 180 mg by mouth daily.   Yes [provider]  fluticasone (FLONASE) 50 MCG/ACT nasal spray Place 2 sprays into  both nostrils daily.   Yes [provider]  furosemide (LASIX) 20 MG tablet Take 1 tablet (20 mg total) by mouth daily. 01/11/19  Yes Paz, Jacqulyn Bath E, MD  glucose blood (ACCU-CHEK GUIDE) test strip 1 each by Other route 2 (two) times daily. Use as instructed 04/23/17  Yes Alene Mires, Sahar M, PA-C  Magnesium Oxide (MAG-OXIDE PO) Take 500 mg by mouth daily.   Yes [provider]  metFORMIN (GLUMETZA) 1000 MG (MOD) 24 hr tablet TAKE 2 TABLETS (2,000 MG TOTAL) DAILY 07/15/18  Yes Philemon Kingdom, MD  Multiple Vitamin (MULTIVITAMIN) tablet Take 1 tablet by mouth daily.    Yes [provider]  Multiple Vitamins-Minerals (ZINC PO) Take by mouth.   Yes [provider]  pantoprazole (PROTONIX) 40 MG tablet  Take 1 tablet (40 mg total) by mouth daily. 05/18/18  Yes Colon Branch, MD  QUERCETIN PO Take by mouth.   Yes [provider]  verapamil (CALAN-SR) 240 MG CR tablet Take 1 tablet (240 mg total) by mouth daily. 01/11/19  Yes Colon Branch, MD  VITAMIN D PO Take by mouth.   Yes [provider]    Family History Family History  Problem Relation Age of Onset  . Obesity Mother   . Depression Father   . Anxiety disorder Father   . Bipolar disorder Father   . Drug abuse Father   . Cancer Maternal Grandfather        GBM   . Diabetes Paternal Grandmother   . Hypertension Paternal Grandmother   . Diabetes Paternal Grandfather   . Cancer Paternal Grandfather        prostate cancer  . Prostate cancer Other        GF  . CAD Other        PGF  . Colon cancer Neg Hx     Social History Social History   Tobacco Use  . Smoking status: Never Smoker  . Smokeless tobacco: Never Used  Substance Use Topics  . Alcohol use: Yes    Alcohol/week: 0.0 standard drinks    Comment: occ  . Drug use: No     Allergies   Levaquin [levofloxacin in d5w], Celexa [citalopram], Dextrans, Hydrocodone-acetaminophen, Latex, and Oxycodone   Review of Systems Review of Systems Pertinent negatives listed in HPI Physical Exam Triage Vital Signs ED Triage Vitals  Enc Vitals Group     BP 03/07/19 1035 (!) 149/92     Pulse Rate 03/07/19 1035 85     Resp --      Temp 03/07/19 1035 98.7 F (37.1 C)     Temp Source 03/07/19 1035 Oral     SpO2 03/07/19 1035 97 %     Weight 03/07/19 1036 (!) 382 lb 4 oz (173.4 kg)     Height 03/07/19 1036 5\' 11"  (1.803 m)     Head Circumference --      Peak Flow --      Pain Score 03/07/19 1036 6     Pain Loc --      Pain Edu? --      Excl. in [place]? --    No data found.  Updated Vital Signs BP (!) 149/92 (BP Location: Right Arm)   Pulse 85   Temp 98.7 F (37.1 C) (Oral)   Ht 5\' 11"  (1.803 m)   Wt (!) 382 lb 4 oz (173.4 kg)   SpO2 97%   BMI 53.31  kg/m   Visual Acuity Right Eye  Distance:   Left Eye Distance:   Bilateral Distance:    Right Eye Near:   Left Eye Near:    Bilateral Near:     Physical Exam Vitals reviewed.  Constitutional:      Appearance: He is obese.  HENT:     Right Ear: External ear normal. Tenderness present. A middle ear effusion is present. Tympanic membrane is erythematous.     Left Ear: A middle ear effusion is present.     Nose: Congestion present.     Mouth/Throat:     Mouth: Mucous membranes are moist.  Cardiovascular:     Rate and Rhythm: Normal rate and regular rhythm.  Pulmonary:     Effort: Pulmonary effort is normal.     Breath sounds: Normal breath sounds.  Skin:    General: Skin is warm.  Neurological:     Mental Status: He is alert and oriented to person, place, and time.  Psychiatric:        Mood and Affect: Mood normal.     UC Treatments / Results  Labs (all labs ordered are listed, but only abnormal results are displayed) Labs Reviewed - No data to display  EKG   Radiology No results found.  Procedures Procedures (including critical care time)  Medications Ordered in UC Medications - No data to display  Initial Impression / Assessment and Plan / UC Course  I have reviewed the triage vital signs and the nursing notes.  Pertinent labs & imaging results that were available during my care of the patient were reviewed by me and considered in my medical decision making (see chart for details).   Right acute otitis media without TM rupture. Treating empirically with Augmentin BID x 10 days. Left Effusion present without infection. Pain is likely stemming from underlying ET dysfunction, trial a short-course of prednisone to reduce inflammation related to ET dysfunction. Advised to return for care or follow-up with ENT if symptoms worsen or do not improve.  Final Clinical Impressions(s) / UC Diagnoses   Final diagnoses:  Recurrent acute suppurative otitis media of right  ear without spontaneous rupture of tympanic membrane  Acute effusion of left ear     Discharge Instructions     Follow-up with your ENT to advise of recent infection and treatment. Ok to continue Ciprodex along with the Augmentin. If symptoms worsen, return for follow-up immediately.     ED Prescriptions    Medication Sig Dispense Auth. Provider   predniSONE (DELTASONE) 20 MG tablet Take 2 tablets (40 mg total) by mouth daily with breakfast for 5 days. 10 tablet Scot Jun, FNP   amoxicillin-clavulanate (AUGMENTIN) 875-125 MG tablet Take 1 tablet by mouth 2 (two) times daily. 20 tablet Scot Jun, FNP     PDMP not reviewed this encounter.   Scot Jun, FNP 03/09/19 1358    Scot Jun, [place] 03/09/19 1359

## 2019-03-07 NOTE — Discharge Instructions (Signed)
Follow-up with your ENT to advise of recent infection and treatment. Ok to continue Ciprodex along with the Augmentin. If symptoms worsen, return for follow-up immediately.

## 2019-03-31 ENCOUNTER — Telehealth: Payer: Self-pay | Admitting: Hematology

## 2019-03-31 NOTE — Telephone Encounter (Signed)
FAXED OFFICE NOTES AND LABS TO [facility] [phone]

## 2019-04-06 ENCOUNTER — Telehealth: Payer: Self-pay | Admitting: Hematology

## 2019-04-06 NOTE — Telephone Encounter (Signed)
Scheduled appt per 2/22 sch message- unable to reach pt- left message with appt date and time

## 2019-04-07 ENCOUNTER — Other Ambulatory Visit: Payer: Managed Care, Other (non HMO)

## 2019-04-07 ENCOUNTER — Inpatient Hospital Stay: Payer: Managed Care, Other (non HMO)

## 2019-04-14 ENCOUNTER — Ambulatory Visit: Payer: Managed Care, Other (non HMO) | Admitting: Hematology

## 2019-05-13 ENCOUNTER — Other Ambulatory Visit: Payer: Self-pay | Admitting: Internal Medicine

## 2019-07-08 ENCOUNTER — Encounter: Payer: Self-pay | Admitting: Hematology

## 2019-07-08 ENCOUNTER — Other Ambulatory Visit: Payer: Self-pay

## 2019-07-08 ENCOUNTER — Other Ambulatory Visit: Payer: Self-pay | Admitting: Hematology

## 2019-07-08 DIAGNOSIS — Z111 Encounter for screening for respiratory tuberculosis: Secondary | ICD-10-CM

## 2019-07-08 DIAGNOSIS — R590 Localized enlarged lymph nodes: Secondary | ICD-10-CM

## 2019-07-08 NOTE — Progress Notes (Signed)
[place]   [phone] [phone]   Clinic Follow up Note   Patient Care Team: Colon Branch, MD as PCP - General (Internal Medicine) Chesley Mires, MD as Consulting Physician (Pulmonary Disease) Lorenza Evangelist, MD as Referring Physician (Urology) Ralene Bathe, MD as Consulting Physician (Ophthalmology) Sarina Ser., MD as Referring Physician (Urology) Philemon Kingdom, MD as Consulting Physician (Endocrinology)  Date of Service:  07/14/2019  CHIEF COMPLAINT: F/u of Mesenteric lymphadenopathy and splenomegaly  CURRENT THERAPY:  Observation  INTERVAL HISTORY:  Julian Washington is here for a follow up. He was last seen by me in 03/2018. He presents to the clinic alone. He notes he is doing well. He denies any new changes. He denies recent fever, chills, night sweats. He is seeing Dr. Beaulah Corin for weight management and still working on losing weight. He continues to f/u with her PCP and Endocrinologist. He is not currently on Testosterone replacement yet. He may considering having another child. He has Psoriasis and plans to start treatment. He has stable Venous stasis. 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     REVIEW OF SYSTEMS:   Constitutional: Denies fevers, chills or abnormal weight loss Eyes: Denies blurriness of vision Ears, nose, mouth, throat, and face: Denies mucositis or sore throat Respiratory: Denies cough, dyspnea or wheezes Cardiovascular: Denies palpitation, chest discomfort (+) lower extremity swelling with venous stasis  Gastrointestinal:  Denies nausea, heartburn or change in bowel habits Skin: Denies abnormal skin rashes (+) Increased Psoriasis of truck and upper back.  Lymphatics: Denies new lymphadenopathy or easy bruising Neurological:Denies numbness, tingling or new weaknesses Behavioral/Psych: Mood is stable, no new changes  All other systems were reviewed with the patient and  are negative.  MEDICAL HISTORY:  Past Medical History:  Diagnosis Date   ADHD    Anemia    Anxiety and depression    h/o suicidality w/ citalopram   Diabetes mellitus without complication ([place])    Difficult intubation    ETD (eustachian tube dysfunction)    s/p ENT, declined ear tuves before    GERD (gastroesophageal reflux disease)    History of chicken pox    Titered on 08/16/2005   Hypogonadism male    Dr Cruzita Lederer, used to see urology   Infertility male    Internal hemorrhoids    s/p banding   Low testosterone    Migraine    on topamax   OSA on CPAP    Palpitations    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[place])    h/o increased prolactin, Dr Cruzita Lederer (previously @ [facility])   Prediabetes    A1C 6.2 years ago   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   Vitamin B 12 deficiency    h/o   Vitamin D deficiency     SURGICAL HISTORY: Past Surgical History:  Procedure Laterality Date   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-04-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, ACTH   TONSILLECTOMY AND ADENOIDECTOMY      I have reviewed the social history and family history with the patient and they are unchanged from previous note.  ALLERGIES:  is allergic to levaquin [levofloxacin in d5w]; celexa [citalopram]; dextrans; hydrocodone-acetaminophen; latex; and oxycodone.  MEDICATIONS:  Current Outpatient Medications  Medication Sig Dispense Refill   Multiple Vitamins-Minerals (WOMENS MULTIVITAMIN PO) Take 1 tablet by mouth daily.     ACCU-CHEK FASTCLIX LANCETS MISC 1 Package by Does not apply route 2 (two) times daily.  100 each 0   Ascorbic Acid (VITAMIN C PO) Take by mouth.     atomoxetine (STRATTERA) 100 MG capsule      Calcipotriene-Betameth Diprop (ENSTILAR EX) Apply topically.     CONCERTA 18 MG CR tablet      fexofenadine (ALLEGRA) 180 MG tablet Take 180 mg by mouth daily.     fluticasone (FLONASE) 50 MCG/ACT nasal spray Place 2 sprays into both nostrils daily.     furosemide (LASIX) 20  MG tablet Take 1 tablet (20 mg total) by mouth daily. 90 tablet 3   glucose blood (ACCU-CHEK GUIDE) test strip 1 each by Other route 2 (two) times daily. Use as instructed 100 each 0   Magnesium Oxide (MAG-OXIDE PO) Take 500 mg by mouth daily.     metFORMIN (GLUMETZA) 1000 MG (MOD) 24 hr tablet TAKE 2 TABLETS (2,000 MG TOTAL) DAILY 180 tablet 3   Multiple Vitamins-Minerals (ZINC PO) Take by mouth.     pantoprazole (PROTONIX) 40 MG tablet Take 1 tablet (40 mg total) by mouth daily. 90 tablet 3   QUERCETIN PO Take by mouth.     SKYRIZI, 150 MG DOSE, 75 MG/0.83ML PSKT      verapamil (CALAN-SR) 240 MG CR tablet Take 1 tablet (240 mg total) by mouth daily. 90 tablet 3   VITAMIN D PO Take by mouth.     No current facility-administered medications for this visit.    PHYSICAL EXAMINATION: ECOG PERFORMANCE STATUS: 1 - Symptomatic but completely ambulatory  Vitals:   07/14/19 0821  BP: 139/76  Pulse: 73  Resp: 17  Temp: 97.7 F (36.5 C)  SpO2: 98%   Filed Weights   07/14/19 0821  Weight: (!) 389 lb 4.8 oz (176.6 kg)    Due to COVID19 we will limit examination to appearance. Patient had no complaints.  GENERAL:alert, no distress and comfortable SKIN: skin color normal, no rashes or significant lesions EYES: normal, Conjunctiva are pink and non-injected, sclera clear  NEURO: alert & oriented x 3 with fluent speech    LABORATORY DATA:  I have reviewed the data as listed CBC Latest Ref Rng & Units 07/09/2019 04/08/2018 09/24/2017  WBC 4.0 - 10.5 K/uL 7.2 9.1 7.7  Hemoglobin 13.0 - 17.0 g/dL 12.3(L) 12.4(L) 12.0(L)  Hematocrit 39.0 - 52.0 % 37.6(L) 37.9(L) 35.9(L)  Platelets 150 - 400 K/uL 171 167 203     CMP Latest Ref Rng & Units 07/09/2019 11/04/2018 04/08/2018  Glucose 70 - 99 mg/dL 114(H) 114(H) 95  BUN 6 - 20 mg/dL 11 18 15   Creatinine 0.61 - 1.24 mg/dL 0.75 0.89 0.80  Sodium 135 - 145 mmol/L 142 139 139  Potassium 3.5 - 5.1 mmol/L 4.5 4.4 4.3  Chloride 98 - 111 mmol/L  109 103 106  CO2 22 - 32 mmol/L 23 28 24   Calcium 8.9 - 10.3 mg/dL 9.1 9.1 8.7(L)  Total Protein 6.5 - 8.1 g/dL 6.5 6.4 6.8  Total Bilirubin 0.3 - 1.2 mg/dL 0.5 0.5 0.4  Alkaline Phos 38 - 126 U/L 75 72 86  AST 15 - 41 U/L 16 13 12(L)  ALT 0 - 44 U/L 25 17 19    CT AP W Contrast 07/09/19 IMPRESSION: 1. Stable exam. No new or progressive interval findings. 2. Stable appearance of upper abdominal lymphadenopathy, likely reactive given the long-term stability. 3. Stable 13 mm left adrenal nodule, consistent with benign etiology such as adenoma. 4. Hepatic steatosis with hepatomegaly. 5. Stable tiny pulmonary nodules in the lung bases, consistent  with benign etiology given the more than 1 year interval stability.   RADIOGRAPHIC STUDIES: I have personally reviewed the radiological images as listed and agreed with the findings in the report. No results found.   ASSESSMENT & PLAN:  Julian Washington is a 39 y.o. male with    1. Abdominal lymphadenopathy, likely reactive  -His older 04/2010 CT showed enlarged mesenteric lymph nodeswith largest 1.7cmat that time. His 09/2017 scan showed slightlyenlargedmesenteric lymphadenopathy overall stable.  -CT AP from 04/08/18 shows stable porta hepatis node (2cm, unchanged since 09/2017 but new since 2012) and mild mesenteric adenopathy and mild splenomegaly and several tiny pulmonary nodules favoring small lymph nodes.  -He does not have palpable peripheral adenopathy on exam, no B symptoms, no other adenopathy on the CT scan, my suspicion for lymphoma is low, although indeterminate lymphoma such as follicular lymphoma is not completely ruled out but low possibility. -We discussed his case in the GI Tumor Board previously. Although the peripancreatic lymph nodes is technically feasible for biopsy through EUS, we all felt his abdominal adenopathy has been stable over several years, likely benign, and we recommend continue monitoring without biopsy. Pt agreed  with plan.  -I reviewed and discussed his CT AP from 07/09/19 with pt today which shows stable Lymphadenopathy (some are slightly smaller) with very little change from last year.  I think his abdominal adenopathy is likely benign and reactive.  I reviewed incidental findings of scan with patient.  -He is clinically doing well. Iron deficiency has improved. CBC and CMP WNL except Hg 12.3, BG 114.  -I discussed given the long course of stable LNs, my suspicion for malignancy is very low. I discussed continuing observation with a f/u in 2 years and will repeat scan only as needed. He knows to continue to watch for concerning symptoms.    2. Several subcentimeter pulmonary Nodules -Seen on CT AP from 04/08/18, just a 6 mm -He is young and never smoked, low risk for lung cancer. -Stable on 06/23/18 and 02/03/19 CT chest, likely benign.  No routine follow-up scan is recommended.  3. Splenomegaly  -His 8/10/119 scan also showed 17cm spleen. I suspect this can be related to his fatty liver. I could not palpable spleen on initial exam.His spleen size was normal on his CT in 2012. -His 2018 lipid panel showed was nearly within normal limits. He has tried to lose weight and has attempted [facility] which did not work for him. He plans to return to weight watchers. He knows his option of bariatric surgery if needed. -His Spleen is slightly smaller at 16.6cm on CT CP from 04/08/18.  -His JAK2 mutation was negative.  -There is no indication for treatment at this time. I encouraged him to avoid injuryto spleen as there is a risk for rupture due to its enlargement.  -He has normal CBC, except a mild iron deficient anemia, my suspicion for myeloproliferative neoplasm is low.    4. 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 -Labs from 04/08/18 shows mild iron  deficient anemia. I recommend he take OTC oral iron daily.  -Labs from last week show Hg 12.3,  iron 49, Ferritin 76 (07/09/19), overall improved.   5. Obesity -I previously tried [facility] before and would not like to return.  -He is not ready for any surgery at this time -I strongly encouraged him  to continue with diet and exercise to work on weight loss. He agrees.    PLAN: -Scan reviewed with pt, stable abdominal adenopathy, likely benign.  -Lab and F/u in 2 years    No problem-specific Assessment & Plan notes found for this encounter.   No orders of the defined types were placed in this encounter.  All questions were answered. The patient knows to call the clinic with any problems, questions or concerns. No barriers to learning was detected. The total time spent in the appointment was 30 minutes.     Truitt Merle, MD 07/14/2019   I, Joslyn Devon, am acting as scribe for Truitt Merle, MD.   I have reviewed the above documentation for accuracy and completeness, and I agree with the above.

## 2019-07-09 ENCOUNTER — Other Ambulatory Visit: Payer: Self-pay

## 2019-07-09 ENCOUNTER — Inpatient Hospital Stay: Payer: Managed Care, Other (non HMO)

## 2019-07-09 ENCOUNTER — Ambulatory Visit (HOSPITAL_COMMUNITY)
Admission: RE | Admit: 2019-07-09 | Discharge: 2019-07-09 | Disposition: A | Discharge status: Home / Self Care | Payer: Managed Care, Other (non HMO) | Source: Ambulatory Visit | Attending: Hematology | Admitting: Hematology

## 2019-07-09 DIAGNOSIS — R591 Generalized enlarged lymph nodes: Secondary | ICD-10-CM | POA: Insufficient documentation

## 2019-07-09 DIAGNOSIS — R590 Localized enlarged lymph nodes: Secondary | ICD-10-CM

## 2019-07-09 DIAGNOSIS — D649 Anemia, unspecified: Secondary | ICD-10-CM

## 2019-07-09 DIAGNOSIS — R1907 Generalized intra-abdominal and pelvic swelling, mass and lump: Secondary | ICD-10-CM | POA: Diagnosis not present

## 2019-07-09 DIAGNOSIS — R161 Splenomegaly, not elsewhere classified: Secondary | ICD-10-CM | POA: Insufficient documentation

## 2019-07-09 DIAGNOSIS — R918 Other nonspecific abnormal finding of lung field: Secondary | ICD-10-CM | POA: Insufficient documentation

## 2019-07-09 DIAGNOSIS — Z111 Encounter for screening for respiratory tuberculosis: Secondary | ICD-10-CM

## 2019-07-09 LAB — FERRITIN: Ferritin: 76 ng/mL (ref 24–336)

## 2019-07-09 LAB — CMP (CANCER CENTER ONLY)
ALT: 25 U/L (ref 0–44)
AST: 16 U/L (ref 15–41)
Albumin: 3.8 g/dL (ref 3.5–5.0)
Alkaline Phosphatase: 75 U/L (ref 38–126)
Anion gap: 10 (ref 5–15)
BUN: 11 mg/dL (ref 6–20)
CO2: 23 mmol/L (ref 22–32)
Calcium: 9.1 mg/dL (ref 8.9–10.3)
Chloride: 109 mmol/L (ref 98–111)
Creatinine: 0.75 mg/dL (ref 0.61–1.24)
GFR, Est AFR Am: >60 mL/min (ref >60)
GFR, Estimated: >60 mL/min (ref >60)
Glucose, Bld: 114 mg/dL — ABNORMAL HIGH (ref 70–99)
Potassium: 4.5 mmol/L (ref 3.5–5.1)
Sodium: 142 mmol/L (ref 135–145)
Total Bilirubin: 0.5 mg/dL (ref 0.3–1.2)
Total Protein: 6.5 g/dL (ref 6.5–8.1)

## 2019-07-09 LAB — CBC WITH DIFFERENTIAL (CANCER CENTER ONLY)
Abs Immature Granulocytes: 0.05 10*3/uL (ref 0.00–0.07)
Basophils Absolute: 0 10*3/uL (ref 0.0–0.1)
Basophils Relative: 1 %
Eosinophils Absolute: 0.1 10*3/uL (ref 0.0–0.5)
Eosinophils Relative: 2 %
HCT: 37.6 % — ABNORMAL LOW (ref 39.0–52.0)
Hemoglobin: 12.3 g/dL — ABNORMAL LOW (ref 13.0–17.0)
Immature Granulocytes: 1 %
Lymphocytes Relative: 29 %
Lymphs Abs: 2.1 10*3/uL (ref 0.7–4.0)
MCH: 26.9 pg (ref 26.0–34.0)
MCHC: 32.7 g/dL (ref 30.0–36.0)
MCV: 82.1 fL (ref 80.0–100.0)
Monocytes Absolute: 0.4 10*3/uL (ref 0.1–1.0)
Monocytes Relative: 6 %
Neutro Abs: 4.5 10*3/uL (ref 1.7–7.7)
Neutrophils Relative %: 61 %
Platelet Count: 171 10*3/uL (ref 150–400)
RBC: 4.58 MIL/uL (ref 4.22–5.81)
RDW: 13.2 % (ref 11.5–15.5)
WBC Count: 7.2 10*3/uL (ref 4.0–10.5)
nRBC: 0 % (ref 0.0–0.2)

## 2019-07-09 LAB — RETICULOCYTES
Immature Retic Fract: 13.8 % (ref 2.3–15.9)
RBC.: 4.56 MIL/uL (ref 4.22–5.81)
Retic Count, Absolute: 65.7 10*3/uL (ref 19.0–186.0)
Retic Ct Pct: 1.4 % (ref 0.4–3.1)

## 2019-07-09 LAB — IRON AND TIBC
Iron: 49 ug/dL (ref 42–163)
Saturation Ratios: 15 % — ABNORMAL LOW (ref 20–55)
TIBC: 319 ug/dL (ref 202–409)
UIBC: 270 ug/dL (ref 117–376)

## 2019-07-09 MED ORDER — SODIUM CHLORIDE (PF) 0.9 % IJ SOLN
INTRAMUSCULAR | Status: AC
  Filled 2019-07-09: qty 50

## 2019-07-09 MED ORDER — IOHEXOL 9 MG/ML PO SOLN
ORAL | Status: AC
  Filled 2019-07-09: qty 500

## 2019-07-09 MED ORDER — IOHEXOL 300 MG/ML  SOLN
100.0000 mL | Freq: Once | INTRAMUSCULAR | Status: AC | PRN
  Administered 2019-07-09: 100 mL via INTRAVENOUS

## 2019-07-11 LAB — QUANTIFERON-TB GOLD PLUS (RQFGPL)
QuantiFERON Mitogen Value: >10 IU/mL
QuantiFERON Nil Value: 0 IU/mL
QuantiFERON TB1 Ag Value: 0 IU/mL
QuantiFERON TB2 Ag Value: 0 IU/mL

## 2019-07-11 LAB — QUANTIFERON-TB GOLD PLUS: QuantiFERON-TB Gold Plus: NEGATIVE

## 2019-07-14 ENCOUNTER — Other Ambulatory Visit: Payer: Self-pay

## 2019-07-14 ENCOUNTER — Inpatient Hospital Stay: Payer: Managed Care, Other (non HMO) | Attending: Hematology | Admitting: Hematology

## 2019-07-14 ENCOUNTER — Encounter: Payer: Self-pay | Admitting: Hematology

## 2019-07-14 VITALS — BP 139/76 | HR 73 | Temp 97.7°F | Resp 17 | Ht 71.0 in | Wt 389.3 lb

## 2019-07-14 DIAGNOSIS — F329 Major depressive disorder, single episode, unspecified: Secondary | ICD-10-CM | POA: Insufficient documentation

## 2019-07-14 DIAGNOSIS — R591 Generalized enlarged lymph nodes: Secondary | ICD-10-CM | POA: Diagnosis present

## 2019-07-14 DIAGNOSIS — E669 Obesity, unspecified: Secondary | ICD-10-CM | POA: Diagnosis not present

## 2019-07-14 DIAGNOSIS — R162 Hepatomegaly with splenomegaly, not elsewhere classified: Secondary | ICD-10-CM | POA: Insufficient documentation

## 2019-07-14 DIAGNOSIS — R590 Localized enlarged lymph nodes: Secondary | ICD-10-CM | POA: Diagnosis not present

## 2019-07-14 DIAGNOSIS — Z79899 Other long term (current) drug therapy: Secondary | ICD-10-CM | POA: Insufficient documentation

## 2019-07-14 DIAGNOSIS — E538 Deficiency of other specified B group vitamins: Secondary | ICD-10-CM | POA: Diagnosis not present

## 2019-07-14 DIAGNOSIS — E119 Type 2 diabetes mellitus without complications: Secondary | ICD-10-CM | POA: Insufficient documentation

## 2019-07-14 DIAGNOSIS — R918 Other nonspecific abnormal finding of lung field: Secondary | ICD-10-CM | POA: Insufficient documentation

## 2019-07-15 ENCOUNTER — Telehealth: Payer: Self-pay | Admitting: Hematology

## 2019-07-15 NOTE — Telephone Encounter (Signed)
Per 07/14/2019 los, lab and f/u 2 years.  MD template not open that far out.  Appt will have to be scheduled once template is open

## 2019-07-16 ENCOUNTER — Ambulatory Visit: Payer: Managed Care, Other (non HMO) | Admitting: Pulmonary Disease

## 2019-09-03 ENCOUNTER — Encounter: Payer: Self-pay | Admitting: Internal Medicine

## 2019-09-26 ENCOUNTER — Other Ambulatory Visit: Payer: Self-pay

## 2019-09-26 ENCOUNTER — Ambulatory Visit
Admission: EM | Admit: 2019-09-26 | Discharge: 2019-09-26 | Disposition: A | Discharge status: Home / Self Care | Payer: Managed Care, Other (non HMO) | Attending: Emergency Medicine | Admitting: Emergency Medicine

## 2019-09-26 ENCOUNTER — Encounter: Payer: Self-pay | Admitting: Emergency Medicine

## 2019-09-26 DIAGNOSIS — J069 Acute upper respiratory infection, unspecified: Secondary | ICD-10-CM | POA: Diagnosis not present

## 2019-09-26 DIAGNOSIS — M546 Pain in thoracic spine: Secondary | ICD-10-CM

## 2019-09-26 MED ORDER — AEROCHAMBER PLUS FLO-VU MEDIUM MISC: 1.0000 | Freq: Once | 0 refills | Status: AC

## 2019-09-26 MED ORDER — ALBUTEROL SULFATE HFA 108 (90 BASE) MCG/ACT IN AERS
2.0000 | INHALATION_SPRAY | RESPIRATORY_TRACT | 0 refills | Status: DC | PRN
Start: 2019-09-26 — End: 2021-03-09

## 2019-09-26 NOTE — ED Triage Notes (Signed)
Pt here with cough and congestion with pain in right side of back x 5 days; pt has had 2 negative covid tests

## 2019-09-26 NOTE — ED Provider Notes (Signed)
[facility]    CSN: [id number] Arrival date & time: 09/26/19  0849      History   Chief Complaint Chief Complaint  Patient presents with  . Cough  . Back Pain    HPI Julian Washington is a 39 y.o. male with extensive medical history as outlined below presenting for URI symptoms x5 days.  Patient per registry: Endorsing dry cough with nasal congestion.  No sore throat, difficulty swallowing, fever.  Has had increased dyspnea, though also returned from out of state trip recently where climate was different.  Patient feels this is contributing.  Compliant with CPAP.  Has undergone 2 - Covid test since symptom onset.  Does endorse right-sided back pain that is nonradiating.  No neck pain, upper extremity numbness or weakness, trauma.   Past Medical History:  Diagnosis Date  . ADHD   . Anemia   . Anxiety and depression    h/o suicidality w/ citalopram  . Diabetes mellitus without complication ([place])   . Difficult intubation   . ETD (eustachian tube dysfunction)    s/p ENT, declined ear tuves before   . GERD (gastroesophageal reflux disease)   . History of chicken pox    Titered on 08/16/2005  . Hypogonadism male    Dr Cruzita Lederer, used to see urology  . Infertility male   . Internal hemorrhoids    s/p banding  . Low testosterone   . Migraine    on topamax  . OSA on CPAP   . Palpitations   . 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[place])    h/o increased prolactin, Dr Cruzita Lederer (previously @ [facility])  . Prediabetes    A1C 6.2 years ago  . 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 Vitamin B 12 deficiency    h/o  . Vitamin D deficiency     Patient Active Problem List   Diagnosis Date Noted  . Abnormal finding on lung imaging 06/26/2018  . ADHD 02/28/2018  . Chronic venous insufficiency of lower extremity 02/28/2018  . Psoriasis 02/24/2018  . Splenomegaly 09/24/2017  . 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  . Absolute anemia 12/04/2016  . Type 2 diabetes mellitus without  complication, without long-term current use of insulin ([place]) 09/25/2016  . Morbid obesity ([place]) 09/25/2016  . PCP NOTES >>> 11/15/2014  . H/O: pituitary tumor 01/28/2014  . Annual physical exam 07/13/2013  . Anxiety and depression   . Vitamin B 12 deficiency   . 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 Migraine   . OSA on CPAP   . Vitamin D deficiency   . Internal hemorrhoids   . GERD 06/02/2009    Past Surgical History:  Procedure Laterality Date  . 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 TONSILLECTOMY AND ADENOIDECTOMY         Home Medications    Prior to Admission medications   Medication Sig Start Date End Date Taking? Authorizing Provider  ACCU-CHEK FASTCLIX LANCETS MISC 1 Package by Does not apply route 2 (two) times daily. 04/23/17   Waldon Merl, PA-C  albuterol (VENTOLIN HFA) 108 (90 Base) MCG/ACT inhaler Inhale 2 puffs into the lungs every 4 (four) hours as needed for wheezing or shortness of breath. 09/26/19   Hall-Potvin, [place], PA-C  Ascorbic Acid (VITAMIN C PO) Take by mouth.    [provider]  atomoxetine (STRATTERA) 100 MG capsule  02/18/18   [provider]  Calcipotriene-Betameth Diprop (ENSTILAR EX) Apply topically.  [provider]  CONCERTA 18 MG CR tablet  02/22/18   [provider]  fexofenadine (ALLEGRA) 180 MG tablet Take 180 mg by mouth daily.    [provider]  fluticasone (FLONASE) 50 MCG/ACT nasal spray Place 2 sprays into both nostrils daily.    [provider]  furosemide (LASIX) 20 MG tablet Take 1 tablet (20 mg total) by mouth daily. 01/11/19   Colon Branch, MD  glucose blood (ACCU-CHEK GUIDE) test strip 1 each by Other route 2 (two) times daily. Use as instructed 04/23/17   Waldon Merl, PA-C  Magnesium Oxide (MAG-OXIDE PO) Take 500 mg by mouth daily.    [provider]  metFORMIN (GLUMETZA) 1000 MG (MOD) 24 hr tablet TAKE 2 TABLETS (2,000 MG TOTAL) DAILY  07/15/18   Philemon Kingdom, MD  Multiple Vitamins-Minerals (WOMENS MULTIVITAMIN PO) Take 1 tablet by mouth daily.    [provider]  Multiple Vitamins-Minerals (ZINC PO) Take by mouth.    [provider]  pantoprazole (PROTONIX) 40 MG tablet Take 1 tablet (40 mg total) by mouth daily. 05/13/19   Colon Branch, MD  QUERCETIN PO Take by mouth.    [provider]  SKYRIZI, 150 MG DOSE, 75 MG/0.83ML PSKT  04/09/19   [provider]  Spacer/Aero-Holding Chambers (AEROCHAMBER PLUS FLO-VU MEDIUM) MISC 1 each by Other route once for 1 dose. 09/26/19 09/26/19  Hall-Potvin, [place], PA-C  verapamil (CALAN-SR) 240 MG CR tablet Take 1 tablet (240 mg total) by mouth daily. 01/11/19   Colon Branch, MD  VITAMIN D PO Take by mouth.    [provider]    Family History Family History  Problem Relation Age of Onset  . Obesity Mother   . Depression Father   . Anxiety disorder Father   . Bipolar disorder Father   . Drug abuse Father   . Cancer Maternal Grandfather        GBM   . Diabetes Paternal Grandmother   . Hypertension Paternal Grandmother   . Diabetes Paternal Grandfather   . Cancer Paternal Grandfather        prostate cancer  . Prostate cancer Other        GF  . CAD Other        PGF  . Colon cancer Neg Hx     Social History Social History   Tobacco Use  . Smoking status: Never Smoker  . Smokeless tobacco: Never Used  Vaping Use  . Vaping Use: Never used  Substance Use Topics  . Alcohol use: Yes    Alcohol/week: 0.0 standard drinks    Comment: occ  . Drug use: No     Allergies   Levaquin [levofloxacin in d5w], Celexa [citalopram], Dextrans, Hydrocodone-acetaminophen, Latex, and Oxycodone   Review of Systems As per HPI   Physical Exam Triage Vital Signs ED Triage Vitals  Enc Vitals Group     BP      Pulse      Resp      Temp      Temp src      SpO2      Weight      Height      Head Circumference      Peak Flow      Pain  Score      Pain Loc      Pain Edu?      Excl. in [place]?    No data found.  Updated Vital  Signs BP (!) 169/101 (BP Location: Left Arm)   Pulse 76   Temp 98.4 F (36.9 C) (Oral)   Resp 18   SpO2 97%   Visual Acuity Right Eye Distance:   Left Eye Distance:   Bilateral Distance:    Right Eye Near:   Left Eye Near:    Bilateral Near:     Physical Exam Constitutional:      General: He is not in acute distress.    Appearance: He is not toxic-appearing or diaphoretic.  HENT:     Head: Normocephalic and atraumatic.     Right Ear: Tympanic membrane, ear canal and external ear normal.     Left Ear: Tympanic membrane, ear canal and external ear normal.     Mouth/Throat:     Mouth: Mucous membranes are moist.     Pharynx: Oropharynx is clear.  Eyes:     General: No scleral icterus.    Conjunctiva/sclera: Conjunctivae normal.     Pupils: Pupils are equal, round, and reactive to light.  Neck:     Comments: Trachea midline, negative JVD Cardiovascular:     Rate and Rhythm: Normal rate and regular rhythm.  Pulmonary:     Effort: Pulmonary effort is normal. No respiratory distress.     Breath sounds: No wheezing or rales.  Musculoskeletal:     Cervical back: Neck supple. No tenderness.  Lymphadenopathy:     Cervical: No cervical adenopathy.  Skin:    Capillary Refill: Capillary refill takes less than 2 seconds.     Coloration: Skin is not jaundiced or pale.     Findings: No bruising or rash.  Neurological:     General: No focal deficit present.     Mental Status: He is alert and oriented to person, place, and time.      UC Treatments / Results  Labs (all labs ordered are listed, but only abnormal results are displayed) Labs Reviewed - No data to display  EKG   Radiology No results found.  Procedures Procedures (including critical care time)  Medications Ordered in UC Medications - No data to display  Initial Impression / Assessment and Plan / UC Course  I  have reviewed the triage vital signs and the nursing notes.  Pertinent labs & imaging results that were available during my care of the patient were reviewed by me and considered in my medical decision making (see chart for details).     Patient afebrile, nontoxic, with SpO2 97%.  Patient feels cough is only worsened since yesterday: Has good oxygenation and reassuring exam at this time-radiography deferred.  Will defer repeat Covid testing at this time and continue supportive care.  Return precautions discussed, patient verbalized understanding and is agreeable to plan. Final Clinical Impressions(s) / UC Diagnoses   Final diagnoses:  Acute right-sided thoracic back pain  URI with cough and congestion     Discharge Instructions     Tessalon for cough. Start flonase, atrovent nasal spray for nasal congestion/drainage. You can use over the counter nasal saline rinse such as neti pot for nasal congestion. Keep hydrated, your urine should be clear to pale yellow in color. Tylenol/motrin for fever and pain. Monitor for any worsening of symptoms, chest pain, shortness of breath, wheezing, swelling of the throat, go to the emergency department for further evaluation needed.     ED Prescriptions    Medication Sig Dispense Auth. Provider   Spacer/Aero-Holding Chambers (AEROCHAMBER PLUS FLO-VU MEDIUM) MISC 1 each by Other route  once for 1 dose. 1 each Hall-Potvin, [place], PA-C   albuterol (VENTOLIN HFA) 108 (90 Base) MCG/ACT inhaler Inhale 2 puffs into the lungs every 4 (four) hours as needed for wheezing or shortness of breath. 18 g Hall-Potvin, [place], PA-C     PDMP not reviewed this encounter.   Hall-Potvin, [place], [place] 09/26/19 [phone]

## 2019-09-26 NOTE — Discharge Instructions (Addendum)

## 2019-10-06 ENCOUNTER — Other Ambulatory Visit: Payer: Self-pay

## 2019-10-06 ENCOUNTER — Encounter: Payer: Self-pay | Admitting: Pulmonary Disease

## 2019-10-06 ENCOUNTER — Ambulatory Visit: Payer: Managed Care, Other (non HMO) | Admitting: Pulmonary Disease

## 2019-10-06 VITALS — BP 132/72 | HR 82 | Temp 97.4°F | Ht 71.0 in | Wt 380.8 lb

## 2019-10-06 DIAGNOSIS — E669 Obesity, unspecified: Secondary | ICD-10-CM | POA: Diagnosis not present

## 2019-10-06 DIAGNOSIS — Z9989 Dependence on other enabling machines and devices: Secondary | ICD-10-CM | POA: Diagnosis not present

## 2019-10-06 DIAGNOSIS — G4733 Obstructive sleep apnea (adult) (pediatric): Secondary | ICD-10-CM

## 2019-10-06 DIAGNOSIS — G4730 Sleep apnea, unspecified: Secondary | ICD-10-CM

## 2019-10-06 NOTE — Patient Instructions (Signed)
Follow up in 1 year.

## 2019-10-06 NOTE — Progress Notes (Signed)
[facility]  Chief Complaint  Patient presents with  . Follow-up    OSA on Cpap    Constitutional:  BP 132/72 (BP Location: Left Arm, Cuff Size: Normal)   Pulse 82   Temp (!) 97.4 F (36.3 C) (Other (Comment)) Comment (Src): wrist  Ht 5\' 11"  (1.803 m)   Wt (!) 380 lb 12.8 oz (172.7 kg)   SpO2 98% Comment: Room air  BMI 53.11 kg/m   Past Medical History:  ADHD, Anemia, Anxiety, Depression, DM, Difficult intubation, GERD, Hypogonadism, Migraine HA, Vit B12 deficiency, Vit D deficiency, Splenomegaly, Psoriasis  Past Surgical History:  His  has a past surgical history that includes Pituitary surgery (03-04-2012) and Tonsillectomy and adenoidectomy.  Brief Summary:  Julian Washington is a 39 y.o. male with obstructive sleep apnea.      Subjective:  Uses CPAP nightly.  Has nasal mask.  Has more trouble over past couple weeks.  Has more allergies and sinus congestion.  Will call DME to try getting back up full face mask.  Not having sore throat, dry mouth, or aerophagia.  Physical Exam:   Appearance - well kempt   ENMT - no sinus tenderness, no oral exudate, no LAN, Mallampati 4 airway, no stridor  Respiratory - equal breath sounds bilaterally, no wheezing or rales  CV - s1s2 regular rate and rhythm, no murmurs  Ext - no clubbing, no edema  Skin - venous stasis changes in his legs  Psych - normal mood and affect   Sleep Tests:  HST 12/09/09 [facility]) >> AHI 27.3, SaO2 low 43% CPAP 09/05/19 to 10/04/19 >> used on 30 of 30 nights with average 7 hrs 34 min.  Average AHI 0.4 with CPAP 15 cm H2O.  Cardiac Tests:  Echo 03/20/16 >> EF 55 to 60%, mild LVH  Social History:  He  reports that he has never smoked. He has never used smokeless tobacco. He reports current alcohol use. He reports that he does not use drugs.  Family History:  His family history includes Anxiety disorder in his father; Bipolar disorder in his  father; CAD in an other family member; Cancer in his maternal grandfather and paternal grandfather; Depression in his father; Diabetes in his paternal grandfather and paternal grandmother; Drug abuse in his father; Hypertension in his paternal grandmother; Obesity in his mother; Prostate cancer in an other family member.     Assessment/Plan:   Obstructive sleep apnea. - he is compliant with CPAP - uses [employer] for his DME - he will call if he needs an order to get back up full face mask - continue CPAP 15 cm H2O  Obesity. - he is aware of how his weight can impact his health, particularly with regard to sleep apnea   Time Spent Involved in Patient Care on Day of Examination:  21 minutes  Follow up:  Patient Instructions  Follow up in 1 year   Medication List:   Allergies as of 10/06/2019      Reactions   Levaquin [levofloxacin In D5w] Swelling   Celexa [citalopram] Other (See Comments)   Mental status changes    Dextrans Other (See Comments)   Makes the pt. Drowsy.    Hydrocodone-acetaminophen    REACTION: itching   Latex    Oxycodone Itching      Medication List       Accurate as of October 06, 2019  9:22 AM. If you have any questions, ask  your nurse or doctor.        Accu-Chek FastClix Lancets Misc 1 Package by Does not apply route 2 (two) times daily.   albuterol 108 (90 Base) MCG/ACT inhaler Commonly known as: VENTOLIN HFA Inhale 2 puffs into the lungs every 4 (four) hours as needed for wheezing or shortness of breath.   atomoxetine 100 MG capsule Commonly known as: STRATTERA   Concerta 18 MG CR tablet Generic drug: methylphenidate   ENSTILAR EX Apply topically.   fexofenadine 180 MG tablet Commonly known as: ALLEGRA Take 180 mg by mouth daily.   fluticasone 50 MCG/ACT nasal spray Commonly known as: FLONASE Place 2 sprays into both nostrils daily.   furosemide 20 MG tablet Commonly known as: LASIX Take 1 tablet (20 mg total) by mouth daily.     glucose blood test strip Commonly known as: Accu-Chek Guide 1 each by Other route 2 (two) times daily. Use as instructed   MAG-OXIDE PO Take 500 mg by mouth daily.   metFORMIN 1000 MG (MOD) 24 hr tablet Commonly known as: GLUMETZA TAKE 2 TABLETS (2,000 MG TOTAL) DAILY   pantoprazole 40 MG tablet Commonly known as: PROTONIX Take 1 tablet (40 mg total) by mouth daily.   QUERCETIN PO Take by mouth.   Skyrizi (150 MG Dose) 75 MG/0.83ML Pskt Generic drug: Risankizumab-rzaa(150 MG Dose)   verapamil 240 MG CR tablet Commonly known as: CALAN-SR Take 1 tablet (240 mg total) by mouth daily. What changed: additional instructions   VITAMIN C PO Take by mouth.   VITAMIN D PO Take by mouth.   WOMENS MULTIVITAMIN PO Take 1 tablet by mouth daily.   ZINC PO Take by mouth.       Signature:  Chesley Mires, MD [place] Pager - [phone] 10/06/2019, 9:22 AM

## 2019-11-12 ENCOUNTER — Encounter: Payer: Managed Care, Other (non HMO) | Admitting: Internal Medicine

## 2019-12-14 ENCOUNTER — Other Ambulatory Visit: Payer: Self-pay | Admitting: Internal Medicine

## 2019-12-24 ENCOUNTER — Encounter: Payer: Self-pay | Admitting: Internal Medicine

## 2020-01-26 ENCOUNTER — Ambulatory Visit (INDEPENDENT_AMBULATORY_CARE_PROVIDER_SITE_OTHER): Payer: Managed Care, Other (non HMO) | Admitting: Internal Medicine

## 2020-01-26 ENCOUNTER — Other Ambulatory Visit: Payer: Self-pay

## 2020-01-26 ENCOUNTER — Encounter: Payer: Self-pay | Admitting: Internal Medicine

## 2020-01-26 VITALS — BP 152/88 | HR 93 | Temp 99.5°F | Resp 18 | Ht 71.0 in | Wt 388.2 lb

## 2020-01-26 DIAGNOSIS — E119 Type 2 diabetes mellitus without complications: Secondary | ICD-10-CM | POA: Diagnosis not present

## 2020-01-26 DIAGNOSIS — Z1159 Encounter for screening for other viral diseases: Secondary | ICD-10-CM

## 2020-01-26 DIAGNOSIS — G4733 Obstructive sleep apnea (adult) (pediatric): Secondary | ICD-10-CM

## 2020-01-26 DIAGNOSIS — Z0000 Encounter for general adult medical examination without abnormal findings: Secondary | ICD-10-CM | POA: Diagnosis not present

## 2020-01-26 DIAGNOSIS — Z87898 Personal history of other specified conditions: Secondary | ICD-10-CM

## 2020-01-26 DIAGNOSIS — R590 Localized enlarged lymph nodes: Secondary | ICD-10-CM

## 2020-01-26 DIAGNOSIS — G43809 Other migraine, not intractable, without status migrainosus: Secondary | ICD-10-CM

## 2020-01-26 DIAGNOSIS — Z9989 Dependence on other enabling machines and devices: Secondary | ICD-10-CM

## 2020-01-26 LAB — LIPID PANEL
Cholesterol: 127 mg/dL (ref 0–200)
HDL: 38.6 mg/dL — ABNORMAL LOW (ref >39.00)
LDL Cholesterol: 64 mg/dL (ref 0–99)
NonHDL: 87.96
Total CHOL/HDL Ratio: 3
Triglycerides: 120 mg/dL (ref 0.0–149.0)
VLDL: 24 mg/dL (ref 0.0–40.0)

## 2020-01-26 LAB — MICROALBUMIN / CREATININE URINE RATIO
Creatinine,U: 57.4 mg/dL
Microalb Creat Ratio: 6.4 mg/g (ref 0.0–30.0)
Microalb, Ur: 3.7 mg/dL — ABNORMAL HIGH (ref 0.0–1.9)

## 2020-01-26 LAB — COMPREHENSIVE METABOLIC PANEL
ALT: 22 U/L (ref 0–53)
AST: 13 U/L (ref 0–37)
Albumin: 4.1 g/dL (ref 3.5–5.2)
Alkaline Phosphatase: 77 U/L (ref 39–117)
BUN: 13 mg/dL (ref 6–23)
CO2: 26 mEq/L (ref 19–32)
Calcium: 8.9 mg/dL (ref 8.4–10.5)
Chloride: 106 mEq/L (ref 96–112)
Creatinine, Ser: 0.67 mg/dL (ref 0.40–1.50)
GFR: 118.18 mL/min (ref >60.00)
Glucose, Bld: 141 mg/dL — ABNORMAL HIGH (ref 70–99)
Potassium: 4.6 mEq/L (ref 3.5–5.1)
Sodium: 138 mEq/L (ref 135–145)
Total Bilirubin: 0.3 mg/dL (ref 0.2–1.2)
Total Protein: 6.4 g/dL (ref 6.0–8.3)

## 2020-01-26 LAB — HEMOGLOBIN A1C: Hgb A1c MFr Bld: 6.5 % (ref 4.6–6.5)

## 2020-01-26 LAB — TSH: TSH: 2.29 u[IU]/mL (ref 0.35–4.50)

## 2020-01-26 MED ORDER — METOPROLOL TARTRATE 50 MG PO TABS
50.0000 mg | ORAL_TABLET | Freq: Two times a day (BID) | ORAL | 3 refills | Status: DC
Start: 2020-01-26 — End: 2020-04-27

## 2020-01-26 NOTE — Patient Instructions (Addendum)
Per our records you are due for an eye exam. Please contact your eye doctor to schedule an appointment. Please have them send copies of your office visit notes to [place]. Our fax number is [phone].  Stop verapamil completely Take metoprolol 50 mg 1 tablet twice a day  Reintroduce Metformin 1000 mg extended release: Half tablet a day for 1 week 1 tablet a day for 1 week 1.5 tablets a day for 1 week Then 2 tablets daily.  Check the  blood pressure 2 or 3 times a week BP GOAL is between 110/65 and  135/85. If it is consistently higher or lower, let me know  We are referring back to Dr. Cruzita Lederer  GO TO THE LAB : Get the blood work     [place], [place] back for   a checkup in 3 months

## 2020-01-26 NOTE — Progress Notes (Signed)
Subjective:    Patient ID: Julian Washington, male    DOB: 08/19/1980, 39 y.o.   MRN: [id number]  DOS:  01/26/2020 Type of visit - description: CPX  In general feeling okay. BP has been recently elevated, at home is check and sometimes is high and sometimes normal. He is self decrease verapamil to half tablet because it was causing edema.  BP Readings from Last 3 Encounters:  01/26/20 (!) 152/88  10/06/19 132/72  09/26/19 (!) 169/101     Review of Systems  Other than above, a 14 point review of systems is negative      Past Medical History:  Diagnosis Date  . ADHD   . Anemia   . Anxiety and depression    h/o suicidality w/ citalopram  . Diabetes mellitus without complication ([place])   . Difficult intubation   . ETD (eustachian tube dysfunction)    s/p ENT, declined ear tuves before   . GERD (gastroesophageal reflux disease)   . History of chicken pox    Titered on 08/16/2005  . Hypogonadism male    Dr Cruzita Lederer, used to see urology  . Infertility male   . Internal hemorrhoids    s/p banding  . Low testosterone   . Migraine    on topamax  . OSA on CPAP   . Palpitations   . 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[place])    h/o increased prolactin, Dr Cruzita Lederer (previously @ [facility])  . Prediabetes    A1C 6.2 years ago  . 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 Vitamin B 12 deficiency    h/o  . Vitamin D deficiency     Past Surgical History:  Procedure Laterality Date  . 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 TONSILLECTOMY AND ADENOIDECTOMY      Allergies as of 01/26/2020      Reactions   Levaquin [levofloxacin In D5w] Swelling   Celexa [citalopram] Other (See Comments)   Mental status changes    Dextrans Other (See Comments)   Makes the pt. Drowsy.    Hydrocodone-acetaminophen    REACTION: itching   Latex    Oxycodone Itching      Medication List       Accurate as of January 26, 2020 11:59 PM. If you have any questions, ask your nurse or  doctor.        STOP taking these medications   atomoxetine 100 MG capsule Commonly known as: STRATTERA Stopped by: Kathlene November, MD   ENSTILAR EX Stopped by: Kathlene November, MD   QUERCETIN PO Stopped by: Kathlene November, MD   verapamil 240 MG CR tablet Commonly known as: CALAN-SR Stopped by: Kathlene November, MD   VITAMIN D PO Stopped by: Kathlene November, MD     TAKE these medications   Accu-Chek FastClix Lancets Misc 1 Package by Does not apply route 2 (two) times daily.   albuterol 108 (90 Base) MCG/ACT inhaler Commonly known as: VENTOLIN HFA Inhale 2 puffs into the lungs every 4 (four) hours as needed for wheezing or shortness of breath.   Concerta 18 MG CR tablet Generic drug: methylphenidate   fexofenadine 180 MG tablet Commonly known as: ALLEGRA Take 180 mg by mouth daily.   fluticasone 50 MCG/ACT nasal spray Commonly known as: FLONASE Place 2 sprays into both nostrils daily.   furosemide 20 MG tablet Commonly known as: LASIX Take 1 tablet (20 mg total) by mouth daily.   glucose blood  test strip Commonly known as: Accu-Chek Guide 1 each by Other route 2 (two) times daily. Use as instructed   MAG-OXIDE PO Take 500 mg by mouth daily.   metFORMIN 1000 MG (MOD) 24 hr tablet Commonly known as: GLUMETZA TAKE 2 TABLETS (2,000 MG TOTAL) DAILY   metoprolol tartrate 50 MG tablet Commonly known as: LOPRESSOR Take 1 tablet (50 mg total) by mouth 2 (two) times daily. Started by: Kathlene November, MD   pantoprazole 40 MG tablet Commonly known as: PROTONIX Take 1 tablet (40 mg total) by mouth daily.   Skyrizi (150 MG Dose) 75 MG/0.83ML Pskt Generic drug: Risankizumab-rzaa(150 MG Dose)   VITAMIN C PO Take by mouth.   WOMENS MULTIVITAMIN PO Take 1 tablet by mouth daily.   ZINC PO Take by mouth.          Objective:   Physical Exam BP (!) 152/88 (BP Location: Left Arm, Patient Position: Sitting, Cuff Size: Normal)   Pulse 93   Temp 99.5 F (37.5 C) (Oral)   Resp 18   Ht 5\' 11"   (1.803 m)   Wt (!) 388 lb 4 oz (176.1 kg)   SpO2 98%   BMI 54.15 kg/m  General: Well developed, NAD, BMI noted Neck: No  thyromegaly  HEENT:  Normocephalic . Face symmetric, atraumatic. Ear exam: Normal Lungs:  CTA B Normal respiratory effort, no intercostal retractions, no accessory muscle use. Heart: RRR,  no murmur.  Abdomen:  Not distended, soft, non-tender. No rebound or rigidity.   Lower extremities: No pitting edema today, skin is stretched and purple in color at the pretibial areas. Skin: Exposed areas without rash. Not pale. Not jaundice Neurologic:  alert & oriented X3.  Speech normal, gait appropriate for age and unassisted Strength symmetric and appropriate for age.  Psych: Cognition and judgment appear intact.  Cooperative with normal attention span and concentration.  Behavior appropriate. No anxious or depressed appearing.     Assessment    Assessment   Prediabetes-- on metformin x years  PSYCH: --Anxiety depression (suicidality w/  Citalopram) --ADHD Dx 2019, Dr Johnnye Sima Morbid obesity Vitamin D and  B12 deficiency Endocrinology:Dr Gherghe --Pituitary mass, surgery : Prolactinoma --Hypogonadism hypogonadotropic --Infertility Migraines, d/c Topamax 05-2015 (pt concerned about short term memory), started verapamil OSA on CPAP -- DR Halford Chessman Hematology -Mild chronic anemia, no previous colonoscopy or EGD. No symptoms. Iron , vitamins normal.  saw hematology 09/2017  -Splenomegaly: Per CT 09/20/2017, saw hematology --Abdominal lymphadenopathies: Felt to be stable, see hematology note from July 14, 2019. Pulmonary nodules: Felt to be stable per last CT. DERM: Psoriasis  Dermatitis, LE  H/o ET  dysfunction, declined ear tubes H/o hemorrhoids s/p  banding H/o abd pain 2012 (w/u CT, [place], a HIDA scan), similar sx 02-2015 H/o  difficulty intubation   PLAN: Here for CPX Prediabetes: Has not taken Metformin for a while, at some point had to stop due to an acute  GI infection.  When he went back to metformin developed diarrhea but is willing to try again.  Recommend Metformin extended release: Start with half tablet qd and work his way up to 2 tablets daily. Migraines: On verapamil for prevention, it worked well however caused LE edema, decrease it to half tablet and edema is better but migraines are little worse.  Switch to metoprolol 50 mg twice daily.  Consider adjust dose soon. Elevated BP: No history of HTN but BP is elevated today, possibly related to decrease dose of verapamil and also b/c  he is now on  Concerta, a stimulant for ADD.  Plan is monitor BPs. History of pituitary mass: Refer to Dr. Cruzita Lederer Psoriasis: Taking Skyrizi, I immunosuppressant. OSA: Saw pulmonary 09-2019 Splenomegaly, anemia, abdominal lymphadenopathies: Saw hematology July 14, 2019 d/t abdominal lymphadenopathy found on CT abdomen, after a thorough review of the data they felt that the lymphadenopathies were stable. Morbid obesity: In no unclear terms I advised patient this is his main medical problem, I recommend a structured weight loss program such as a wellness clinic.  Surgery is also a consideration.  Long-term consequences of M.O.  will come if he does not take action soon. Reports occasional right ear discomfort: Denies any respiratory symptoms, exam is negative, we agreed on observation RTC 3 months  Addition to CPX managed all his chronic medical problems and did extensive review regards to splenomegaly and lymphadenopathies. Extensive conversation about long-term consequences of morbid obesity.   This visit occurred during the SARS-CoV-2 public health emergency.  Safety protocols were in place, including screening questions prior to the visit, additional usage of staff PPE, and extensive cleaning of exam room while observing appropriate contact time as indicated for disinfecting solutions.

## 2020-01-26 NOTE — Progress Notes (Signed)
Pre visit review using our clinic review tool, if applicable. No additional management support is needed unless otherwise documented below in the visit note. 

## 2020-01-27 ENCOUNTER — Encounter: Payer: Self-pay | Admitting: Internal Medicine

## 2020-01-27 LAB — HEPATITIS C ANTIBODY
Hepatitis C Ab: NONREACTIVE
SIGNAL TO CUT-OFF: 0 (ref <1.00)

## 2020-01-27 NOTE — Assessment & Plan Note (Signed)
-  Tdap 2015 - PNM 23: 10/2018 -COVID vaccine x3 - flu shot at work - CCS: no FH . Never had a cscope   -recent labs reviewed CMP, FLP, A1c, TSH, micro, hep C - diet,exercise discussed, at this point is not able to change his lifestyle due to busy schedule.

## 2020-01-27 NOTE — Assessment & Plan Note (Signed)
Here for CPX Prediabetes: Has not taken Metformin for a while, at some point had to stop due to an acute GI infection.  When he went back to metformin developed diarrhea but is willing to try again.  Recommend Metformin extended release: Start with half tablet qd and work his way up to 2 tablets daily. Migraines: On verapamil for prevention, it worked well however caused LE edema, decrease it to half tablet and edema is better but migraines are little worse.  Switch to metoprolol 50 mg twice daily.  Consider adjust dose soon. Elevated BP: No history of HTN but BP is elevated today, possibly related to decrease dose of verapamil and also b/c  he is now on  Concerta, a stimulant for ADD.  Plan is monitor BPs. History of pituitary mass: Refer to Dr. Cruzita Lederer Psoriasis: Taking Skyrizi, I immunosuppressant. OSA: Saw pulmonary 09-2019 Splenomegaly, anemia, abdominal lymphadenopathies: Saw hematology July 14, 2019 d/t abdominal lymphadenopathy found on CT abdomen, after a thorough review of the data they felt that the lymphadenopathies were stable. Morbid obesity: In no unclear terms I advised patient this is his main medical problem, I recommend a structured weight loss program such as a wellness clinic.  Surgery is also a consideration.  Long-term consequences of M.O.  will come if he does not take action soon. Reports occasional right ear discomfort: Denies any respiratory symptoms, exam is negative, we agreed on observation RTC 3 months

## 2020-02-09 ENCOUNTER — Telehealth: Payer: Self-pay | Admitting: Internal Medicine

## 2020-02-09 MED ORDER — BENZONATATE 200 MG PO CAPS: 200.0000 mg | ORAL_CAPSULE | Freq: Three times a day (TID) | ORAL | 0 refills | Status: DC | PRN

## 2020-02-09 NOTE — Telephone Encounter (Signed)
Patient is covid positive coughing and would like TESSALON Prescription sent to the pharmacy   [address] [place], [place] [address] if possible

## 2020-02-09 NOTE — Telephone Encounter (Signed)
Prescription sent. If he needs further advice, I will need more details, arrange a virtual visit with me or an available provider.

## 2020-02-09 NOTE — Telephone Encounter (Signed)
Please advise. Pt vaccinated x3.

## 2020-02-09 NOTE — Telephone Encounter (Signed)
Spoke w/ Pt- he actually has not tested covid +, he was exposed, coughing has started, he goes to get tested tomorrow, informed to let us know results. Tessalon has been sent to [facility].

## 2020-03-01 LAB — HM DIABETES EYE EXAM

## 2020-03-12 ENCOUNTER — Ambulatory Visit
Admission: EM | Admit: 2020-03-12 | Discharge: 2020-03-12 | Disposition: A | Discharge status: Home / Self Care | Payer: Managed Care, Other (non HMO)

## 2020-03-12 ENCOUNTER — Other Ambulatory Visit: Payer: Self-pay

## 2020-03-12 ENCOUNTER — Encounter: Payer: Self-pay | Admitting: *Deleted

## 2020-03-12 DIAGNOSIS — L03031 Cellulitis of right toe: Secondary | ICD-10-CM

## 2020-03-12 MED ORDER — AMOXICILLIN-POT CLAVULANATE 875-125 MG PO TABS
1.0000 | ORAL_TABLET | Freq: Two times a day (BID) | ORAL | 0 refills | Status: DC
Start: 2020-03-12 — End: 2020-04-27

## 2020-03-12 NOTE — Discharge Instructions (Signed)
Keep area(s) clean and dry. Take antibiotic as prescribed with food - important to complete course. Return for worsening pain, redness, swelling, discharge, fever. 

## 2020-03-12 NOTE — ED Triage Notes (Signed)
C/O "picking at a piece of nail that broke off" on right 4th toe 2 days ago.  Yesterday started with redness to toe.    Has applied Mupiricin ointment.

## 2020-03-12 NOTE — ED Provider Notes (Signed)
[facility]    CSN: [id number] Arrival date & time: 03/12/20  0813      History   Chief Complaint Chief Complaint  Patient presents with  . Toe Pain    HPI Julian Washington is a 40 y.o. male  Presenting for right fourth toe pain, swelling x2 days.  States he was picking at a piece of nail that broke off.  Has applied mupirocin ointment without relief.  Denies injury, animal bite, fever.  Past Medical History:  Diagnosis Date  . ADHD   . Anemia   . Anxiety and depression    h/o suicidality w/ citalopram  . Diabetes mellitus without complication ([place])   . Difficult intubation   . ETD (eustachian tube dysfunction)    s/p ENT, declined ear tuves before   . GERD (gastroesophageal reflux disease)   . History of chicken pox    Titered on 08/16/2005  . Hypogonadism male    Dr Cruzita Lederer, used to see urology  . Infertility male   . Internal hemorrhoids    s/p banding  . Low testosterone   . Migraine    on topamax  . OSA on CPAP   . Palpitations   . 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[place])    h/o increased prolactin, Dr Cruzita Lederer (previously @ [facility])  . Prediabetes    A1C 6.2 years ago  . 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 Vitamin B 12 deficiency    h/o  . Vitamin D deficiency     Patient Active Problem List   Diagnosis Date Noted  . Abnormal finding on lung imaging 06/26/2018  . ADHD 02/28/2018  . Chronic venous insufficiency of lower extremity 02/28/2018  . Psoriasis 02/24/2018  . Splenomegaly 09/24/2017  . 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  . Absolute anemia 12/04/2016  . Type 2 diabetes mellitus without complication, without long-term current use of insulin ([place]) 09/25/2016  . Morbid obesity ([place]) 09/25/2016  . PCP NOTES >>> 11/15/2014  . H/O: pituitary tumor 01/28/2014  . Annual physical exam 07/13/2013  . Anxiety and depression   . Vitamin B 12 deficiency   . 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 Migraine   . OSA on CPAP   .  Vitamin D deficiency   . Internal hemorrhoids   . GERD 06/02/2009    Past Surgical History:  Procedure Laterality Date  . 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 TONSILLECTOMY AND ADENOIDECTOMY         Home Medications    Prior to Admission medications   Medication Sig Start Date End Date Taking? Authorizing Provider  amoxicillin-clavulanate (AUGMENTIN) 875-125 MG tablet Take 1 tablet by mouth every 12 (twelve) hours. 03/12/20  Yes Hall-Potvin, [place], PA-C  Ascorbic Acid (VITAMIN C PO) Take by mouth.   Yes [provider]  Atomoxetine HCl (STRATTERA PO) Take by mouth.   Yes [provider]  CONCERTA 18 MG CR tablet  02/22/18  Yes [provider]  fexofenadine (ALLEGRA) 180 MG tablet Take 180 mg by mouth daily.   Yes [provider]  fluticasone (FLONASE) 50 MCG/ACT nasal spray Place 2 sprays into both nostrils daily.   Yes [provider]  Magnesium Oxide (MAG-OXIDE PO) Take 500 mg by mouth daily.   Yes [provider]  METHYLPHENIDATE PO Take by mouth.   Yes [provider]  pantoprazole (PROTONIX) 40 MG tablet Take 1 tablet (40 mg total) by mouth daily. 05/13/19  Yes Paz, Alda Berthold, MD  SKYRIZI, 150 MG DOSE, 75 MG/0.83ML PSKT  04/09/19  Yes [provider]  ACCU-CHEK FASTCLIX LANCETS MISC 1 Package by Does not apply route 2 (two) times daily. 04/23/17   Waldon Merl, PA-C  albuterol (VENTOLIN HFA) 108 (90 Base) MCG/ACT inhaler Inhale 2 puffs into the lungs every 4 (four) hours as needed for wheezing or shortness of breath. 09/26/19   Hall-Potvin, [place], PA-C  furosemide (LASIX) 20 MG tablet Take 1 tablet (20 mg total) by mouth daily. 12/14/19   Colon Branch, MD  glucose blood (ACCU-CHEK GUIDE) test strip 1 each by Other route 2 (two) times daily. Use as instructed 04/23/17   Waldon Merl, PA-C  metFORMIN (GLUMETZA) 1000 MG (MOD) 24 hr tablet TAKE 2 TABLETS (2,000 MG TOTAL) DAILY 07/15/18   Philemon Kingdom, MD  metoprolol tartrate (LOPRESSOR) 50 MG tablet Take 1 tablet (50 mg total) by mouth 2 (two) times daily. 01/26/20   Colon Branch, MD    Family History Family History  Problem Relation Age of Onset  . Obesity Mother   . Depression Father   . Anxiety disorder Father   . Bipolar disorder Father   . Drug abuse Father   . Cancer Maternal Grandfather        GBM   . Diabetes Paternal Grandmother   . Hypertension Paternal Grandmother   . Diabetes Paternal Grandfather   . Cancer Paternal Grandfather        prostate cancer  . Prostate cancer Other        GF  . CAD Other        PGF  . Colon cancer Neg Hx     Social History Social History   Tobacco Use  . Smoking status: Never Smoker  . Smokeless tobacco: Never Used  Vaping Use  . Vaping Use: Never used  Substance Use Topics  . Alcohol use: Not Currently    Comment: none x 10 yrs  . Drug use: No     Allergies   Levaquin [levofloxacin in d5w], Celexa [citalopram], Dextrans, Hydrocodone-acetaminophen, Latex, and Oxycodone   Review of Systems As per HPI   Physical Exam Triage Vital Signs ED Triage Vitals  Enc Vitals Group     BP 03/12/20 0824 (!) 153/89     [place] Rate 03/12/20 0824 75     Resp 03/12/20 0824 16     Temp 03/12/20 0824 (!) 97.2 F (36.2 C)     Temp Source 03/12/20 0824 Temporal     SpO2 03/12/20 0824 98 %     Weight --      Height --      Head Circumference --      Peak Flow --      Pain Score 03/12/20 0826 3     Pain Loc --      Pain Edu? --      Excl. in [place]? --    No data found.  Updated Vital Signs BP (!) 153/89 Comment: has not taken HTN med this AM  Washington 75   Temp (!) 97.2 F (36.2 C) (Temporal)   Resp 16   SpO2 98%   Visual Acuity Right Eye Distance:   Left Eye Distance:   Bilateral Distance:    Right Eye Near:   Left Eye Near:    Bilateral Near:     Physical Exam Constitutional:      General: He is not in acute distress. HENT:  Head: Normocephalic and  atraumatic.  Eyes:     General: No scleral icterus.    Pupils: Pupils are equal, round, and reactive to light.  Cardiovascular:     Rate and Rhythm: Normal rate.  Pulmonary:     Effort: Pulmonary effort is normal. No respiratory distress.     Breath sounds: No wheezing.  Musculoskeletal:        General: Swelling and tenderness present. Normal range of motion.  Skin:    General: Skin is warm.     Capillary Refill: Capillary refill takes less than 2 seconds.     Coloration: Skin is not jaundiced or pale.     Findings: Erythema present.     Comments: Right fourth toe with red, hot swelling and tenderness.  No fluctuance, discharge.  NVI  Neurological:     General: No focal deficit present.     Mental Status: He is alert and oriented to person, place, and time.      UC Treatments / Results  Labs (all labs ordered are listed, but only abnormal results are displayed) Labs Reviewed - No data to display  EKG   Radiology No results found.  Procedures Procedures (including critical care time)  Medications Ordered in UC Medications - No data to display  Initial Impression / Assessment and Plan / UC Course  I have reviewed the triage vital signs and the nursing notes.  Pertinent labs & imaging results that were available during my care of the patient were reviewed by me and considered in my medical decision making (see chart for details).     Likely cellulitis: We will treat as below.  Return precautions discussed, pt verbalized understanding and is agreeable to plan. Final Clinical Impressions(s) / UC Diagnoses   Final diagnoses:  Cellulitis of toe of right foot     Discharge Instructions     Keep area(s) clean and dry. Take antibiotic as prescribed with food - important to complete course. Return for worsening pain, redness, swelling, discharge, fever.    ED Prescriptions    Medication Sig Dispense Auth. Provider   amoxicillin-clavulanate (AUGMENTIN) 875-125 MG  tablet Take 1 tablet by mouth every 12 (twelve) hours. 14 tablet Hall-Potvin, [place], PA-C     PDMP not reviewed this encounter.   Hall-Potvin, [place], [place] 03/12/20 1455

## 2020-03-15 ENCOUNTER — Other Ambulatory Visit: Payer: Self-pay

## 2020-03-15 ENCOUNTER — Ambulatory Visit: Payer: Managed Care, Other (non HMO) | Admitting: Podiatry

## 2020-03-15 DIAGNOSIS — B353 Tinea pedis: Secondary | ICD-10-CM

## 2020-03-15 DIAGNOSIS — L03031 Cellulitis of right toe: Secondary | ICD-10-CM

## 2020-03-15 DIAGNOSIS — L603 Nail dystrophy: Secondary | ICD-10-CM | POA: Diagnosis not present

## 2020-03-15 LAB — HM DIABETES FOOT EXAM

## 2020-03-15 MED ORDER — CLOTRIMAZOLE-BETAMETHASONE 1-0.05 % EX CREA: 1.0000 "application " | TOPICAL_CREAM | Freq: Two times a day (BID) | CUTANEOUS | 1 refills | Status: DC

## 2020-03-15 NOTE — Progress Notes (Signed)
HPI: 40 y.o. male presenting today for evaluation of multiple issues regarding the bilateral feet. First.  The patient states that on 03/10/2020 he had noticed redness and swelling to the right fourth toenail area.  He went to an urgent care and was prescribed Augmentin for 7 days.  He is still taking the Augmentin.  He states that over the last few days there is been significant improvement of the toenail.  And the redness Second, the patient states that he has longstanding history of cracking and itching to the bilateral fifth toes plantar aspect.  He is on immunosuppressant and he is concerned for possible tinea pedis.  This is been ongoing for several months Finally the patient states that he is suffering from EpiFix sensitive nail plate to the left hallux.  He has a history of partial permanent nail avulsions to the medial lateral border of the left hallux nail plate.  He presents for further treatment evaluation  Past Medical History:  Diagnosis Date  . ADHD   . Anemia   . Anxiety and depression    h/o suicidality w/ citalopram  . Diabetes mellitus without complication ([place])   . Difficult intubation   . ETD (eustachian tube dysfunction)    s/p ENT, declined ear tuves before   . GERD (gastroesophageal reflux disease)   . History of chicken pox    Titered on 08/16/2005  . Hypogonadism male    Dr Cruzita Lederer, used to see urology  . Infertility male   . Internal hemorrhoids    s/p banding  . Low testosterone   . Migraine    on topamax  . OSA on CPAP   . Palpitations   . 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[place])    h/o increased prolactin, Dr Cruzita Lederer (previously @ [facility])  . Prediabetes    A1C 6.2 years ago  . 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 Vitamin B 12 deficiency    h/o  . Vitamin D deficiency      Physical Exam: General: The patient is alert and oriented x3 in no acute distress.  Dermatology: Skin is warm, dry and supple bilateral lower extremities. Negative for open  lesions or macerations.  Hyperkeratotic dystrophic nail noted left hallux. Small fissuring of the skin noted to the plantar sulcus of the fifth toes bilateral with associated sensitivity  Vascular: Palpable pedal pulses bilaterally.  There are some very localized mild erythema to the distal tip of the right fourth toe that appears to be resolving and healing nicely  Neurological: Epicritic and protective threshold grossly intact bilaterally.   Musculoskeletal Exam: No pedal deformity noted bilateral  Assessment: 1.  Cellulitis of toe right fourth toe; improving 2.  Fissure of skin fifth digits bilateral/tinea pedis 3.  Dystrophic nail left hallux   Plan of Care:  1. Patient evaluated.  2.  Continue oral Augmentin as prescribed at the urgent care 3.  Prescription for Lotrisone cream.  Apply to the fifth toes 2 times daily 4.  OTC tolcylen antifungal topical was provided for the patient today to apply to the left hallux nail plate 5.  Return to clinic 3 months for routine foot care      Edrick Kins, DPM [facility]  Dr. Edrick Kins, DPM    [address]  [place], [place] [address]                Office [phone]  Fax [phone]

## 2020-04-25 ENCOUNTER — Encounter: Payer: Self-pay | Admitting: Internal Medicine

## 2020-04-27 ENCOUNTER — Other Ambulatory Visit: Payer: Self-pay

## 2020-04-27 ENCOUNTER — Encounter: Payer: Self-pay | Admitting: Internal Medicine

## 2020-04-27 ENCOUNTER — Ambulatory Visit: Payer: Managed Care, Other (non HMO) | Admitting: Internal Medicine

## 2020-04-27 DIAGNOSIS — L409 Psoriasis, unspecified: Secondary | ICD-10-CM

## 2020-04-27 DIAGNOSIS — I872 Venous insufficiency (chronic) (peripheral): Secondary | ICD-10-CM | POA: Diagnosis not present

## 2020-04-27 DIAGNOSIS — D849 Immunodeficiency, unspecified: Secondary | ICD-10-CM

## 2020-04-27 NOTE — Progress Notes (Unsigned)
Subjective:    Patient ID: Julian Washington, male    DOB: 1980-02-27, 40 y.o.   MRN: [id number]  DOS:  04/27/2020 Type of visit - description: ROV Since the last office visit is doing well. Today with talk about diabetes, hypertension, migraines, morbid obesity.  Wt Readings from Last 3 Encounters:  04/27/20 (!) 395 lb 2 oz (179.2 kg)  01/26/20 (!) 388 lb 4 oz (176.1 kg)  10/06/19 (!) 380 lb 12.8 oz (172.7 kg)   BP Readings from Last 3 Encounters:  04/27/20 (!) 152/92  03/12/20 (!) 153/89  01/26/20 (!) 152/88     Review of Systems Seen with toe infection at the ER: Resolved  Past Medical History:  Diagnosis Date  . ADHD   . Anemia   . Anxiety and depression    h/o suicidality w/ citalopram  . Diabetes mellitus without complication ([place])   . Difficult intubation   . ETD (eustachian tube dysfunction)    s/p ENT, declined ear tuves before   . GERD (gastroesophageal reflux disease)   . History of chicken pox    Titered on 08/16/2005  . Hypogonadism male    Dr Cruzita Lederer, used to see urology  . Infertility male   . Internal hemorrhoids    s/p banding  . Low testosterone   . Migraine    on topamax  . OSA on CPAP   . Palpitations   . 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[place])    h/o increased prolactin, Dr Cruzita Lederer (previously @ [facility])  . Prediabetes    A1C 6.2 years ago  . 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 Vitamin B 12 deficiency    h/o  . Vitamin D deficiency     Past Surgical History:  Procedure Laterality Date  . 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 TONSILLECTOMY AND ADENOIDECTOMY      Allergies as of 04/27/2020      Reactions   Levaquin [levofloxacin In D5w] Swelling   Celexa [citalopram] Other (See Comments)   Mental status changes    Dextrans Other (See Comments)   Makes the pt. Drowsy.    Hydrocodone-acetaminophen    REACTION: itching   Latex    Oxycodone Itching      Medication List       Accurate as of April 27, 2020  11:59 PM. If you have any questions, ask your nurse or doctor.        STOP taking these medications   amoxicillin-clavulanate 875-125 MG tablet Commonly known as: AUGMENTIN Stopped by: Kathlene November, MD     TAKE these medications   Accu-Chek FastClix Lancets Misc 1 Package by Does not apply route 2 (two) times daily.   albuterol 108 (90 Base) MCG/ACT inhaler Commonly known as: VENTOLIN HFA Inhale 2 puffs into the lungs every 4 (four) hours as needed for wheezing or shortness of breath.   clotrimazole-betamethasone cream Commonly known as: Lotrisone Apply 1 application topically 2 (two) times daily.   Concerta 18 MG CR tablet Generic drug: methylphenidate   fexofenadine 180 MG tablet Commonly known as: ALLEGRA Take 180 mg by mouth daily.   fluticasone 50 MCG/ACT nasal spray Commonly known as: FLONASE Place 2 sprays into both nostrils daily.   furosemide 20 MG tablet Commonly known as: LASIX Take 1 tablet (20 mg total) by mouth daily.   glucose blood test strip Commonly known as: Accu-Chek Guide 1 each by Other route 2 (two) times daily. Use  as instructed   MAG-OXIDE PO Take 500 mg by mouth daily.   metFORMIN 1000 MG (MOD) 24 hr tablet Commonly known as: GLUMETZA TAKE 2 TABLETS (2,000 MG TOTAL) DAILY   METHYLPHENIDATE PO Take by mouth.   metoprolol tartrate 50 MG tablet Commonly known as: LOPRESSOR Take 1.5 tablets (75 mg total) by mouth 2 (two) times daily. What changed: how much to take Changed by: Kathlene November, MD   pantoprazole 40 MG tablet Commonly known as: PROTONIX Take 1 tablet (40 mg total) by mouth daily.   Skyrizi (150 MG Dose) 75 MG/0.83ML Pskt Generic drug: Risankizumab-rzaa(150 MG Dose)   STRATTERA PO Take by mouth.   VITAMIN C PO Take by mouth.          Objective:   Physical Exam BP (!) 152/92 (BP Location: Left Arm, Patient Position: Sitting, Cuff Size: Normal) Comment: Took meds 30 mins ago  Pulse 95   Temp 98.1 F (36.7 C) (Oral)    Resp 18   Ht 5\' 11"  (1.803 m)   Wt (!) 395 lb 2 oz (179.2 kg)   SpO2 95%   BMI 55.11 kg/m  General:   Well developed, NAD, BMI noted. HEENT:  Normocephalic . Face symmetric, atraumatic Lungs:  CTA B Normal respiratory effort, no intercostal retractions, no accessory muscle use. Heart: RRR,  no murmur.  Lower extremities: Mild edema still present Skin: See picture Neurologic:  alert & oriented X3.  Speech normal, gait appropriate for age and unassisted Psych--  Cognition and judgment appear intact.  Cooperative with normal attention span and concentration.  Behavior appropriate. No anxious or depressed appearing.        Assessment     Assessment   Prediabetes-- on metformin x years  Chronic venous insufficiency/lymphedema/stasis dermatitis PSYCH: --Anxiety depression (suicidality w/  Citalopram) --ADHD Dx 2019, Dr Johnnye Sima Morbid obesity Vitamin D and  B12 deficiency Endocrinology:Dr Gherghe --Pituitary mass, surgery : Prolactinoma --Hypogonadism hypogonadotropic --Infertility Migraines, d/c Topamax 05-2015 (pt concerned about short term memory), started verapamil OSA on CPAP -- DR Halford Chessman Hematology -Mild chronic anemia, no previous colonoscopy or EGD. No symptoms. Iron , vitamins normal.  saw hematology 09/2017  -Splenomegaly: Per CT 09/20/2017, saw hematology --Abdominal lymphadenopathies: Felt to be stable, see hematology note from July 14, 2019. Pulmonary nodules: Felt to be stable per last CT. DERM: Psoriasis  H/o ET  dysfunction, declined ear tubes H/o hemorrhoids s/p  banding H/o abd pain 2012 (w/u CT, [place], a HIDA scan), similar sx 02-2015 H/o  difficulty intubation   PLAN: Prediabetes: Tolerating Metformin well, labs on RTC Migraines: See last visit, CCB's changed to BBs, currently migraines are not a major issueb HTN: BPs has been consistently elevated, currently on metoprolol 50 mg twice daily, heart rate at home 70-80. Increase metoprolol to 75 mg  twice daily, consider further increase in a couple of weeks, see AVS.  He is also on Lasix. M.O.: Has gained 7 pounds since the last visit; states he plans to do better from this point on >>> portion control, increase exercise Psoriasis, he is immunosuppressed, refer for evusheld, will let dermatology know Chronic venous insufficiency/lymphedema/stasis dermatitis: Since the last visit reports the swelling is better.  See picture, this is his best baseline RTC 3 months    This visit occurred during the SARS-CoV-2 public health emergency.  Safety protocols were in place, including screening questions prior to the visit, additional usage of staff PPE, and extensive cleaning of exam room while observing appropriate contact time as  indicated for disinfecting solutions.

## 2020-04-27 NOTE — Patient Instructions (Signed)
Increase metoprolol to 1.5 tablets twice daily  Continue checking your blood pressure  BP GOAL is between 110/65 and  135/85. If you are not getting to goal, let me know, will bump up metoprolol to 100 mg twice daily     GO TO THE FRONT DESK, PLEASE SCHEDULE YOUR APPOINTMENTS Come back for a checkup in 3 months

## 2020-04-28 NOTE — Assessment & Plan Note (Signed)
Prediabetes: Tolerating Metformin well, labs on RTC Migraines: See last visit, CCB's changed to BBs, currently migraines are not a major issueb HTN: BPs has been consistently elevated, currently on metoprolol 50 mg twice daily, heart rate at home 70-80. Increase metoprolol to 75 mg twice daily, consider further increase in a couple of weeks, see AVS.  He is also on Lasix. M.O.: Has gained 7 pounds since the last visit; states he plans to do better from this point on >>> portion control, increase exercise Psoriasis, he is immunosuppressed, refer for evusheld, will let dermatology know Chronic venous insufficiency/lymphedema/stasis dermatitis: Since the last visit reports the swelling is better.  See picture, this is his best baseline RTC 3 months

## 2020-04-30 ENCOUNTER — Other Ambulatory Visit: Payer: Self-pay | Admitting: Physician Assistant

## 2020-04-30 ENCOUNTER — Telehealth: Payer: Self-pay | Admitting: Physician Assistant

## 2020-04-30 DIAGNOSIS — L409 Psoriasis, unspecified: Secondary | ICD-10-CM

## 2020-04-30 NOTE — Progress Notes (Signed)
I connected by phone with Julian Washington on 04/30/2020, 9:46 AM to discuss the potential use of a new treatment, tixagevimab/cilgavimab, for pre-exposure prophylaxis for prevention of coronavirus disease 2019 (COVID-19) caused by the SARS-CoV-2 virus.  This patient is a 40 y.o. male that meets the FDA criteria for Emergency Use Authorization of tixagevimab/cilgavimab for pre-exposure prophylaxis of COVID-19 disease. Pt meets following criteria:  Age >12 yr and weight > 40kg  Not currently infected with SARS-CoV-2 and has no known recent exposure to an individual infected with SARS-CoV-2 AND o Who has moderate to severe immune compromise due to a medical condition or receipt of immunosuppressive medications or treatments and may not mount an adequate immune response to COVID-19 vaccination or  o Vaccination with any available COVID-19 vaccine, according to the approved or authorized schedule, is not recommended due to a history of severe adverse reaction (e.g., severe allergic reaction) to a COVID-19 vaccine(s) and/or COVID-19 vaccine component(s).  o Patient meets the following definition of mod-severe immune compromised status: 8. Other groups not previously mentioned: 4. psoriasis on immunosuppresive therapy. 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 FDA has authorized the emergency use of tixagevimab/cilgavimab for the pre-exposure prophylaxis of COVID-19 in patients with moderate-severe immunocompromised status, who meet above EUA criteria.  2. The significant known and potential risks and benefits of COVID monoclonal antibody, and the extent to which such potential risks and benefits are unknown.  3. Information on available alternative treatments and the risks and benefits of those alternatives, including clinical trials.  4. The patient or parent/caregiver has the  option to accept or refuse COVID monoclonal antibody treatment.  After reviewing this information with the patient, agree to receive tixagevimab/cilgavimab   Pt will work with out scheduling team to get set up .  Angelena Form, PA-C, 04/30/2020, 9:46 AM

## 2020-04-30 NOTE — Telephone Encounter (Signed)
Called pt about Evusheld (tixagevimab co-packaged with cilgavimab) for pre-exposure prophylaxis for prevention of coronavirus disease 2019 (COVID-19) caused by the SARS-CoV-2 virus. The patient is a candidate for this therapy given increased risk for severe disease caused by immunosuppression.    Unable to reach pt- left VM and mychart message.   Kathryn Thompson PA-C  [facility]     

## 2020-05-08 ENCOUNTER — Other Ambulatory Visit: Payer: Self-pay | Admitting: Internal Medicine

## 2020-05-14 ENCOUNTER — Encounter: Payer: Self-pay | Admitting: Internal Medicine

## 2020-05-17 ENCOUNTER — Telehealth: Payer: Self-pay | Admitting: Internal Medicine

## 2020-05-17 MED ORDER — METOPROLOL TARTRATE 50 MG PO TABS: 75.0000 mg | ORAL_TABLET | Freq: Two times a day (BID) | ORAL | 0 refills | Status: DC

## 2020-05-17 MED ORDER — METOPROLOL TARTRATE 50 MG PO TABS: 75.0000 mg | ORAL_TABLET | Freq: Two times a day (BID) | ORAL | 1 refills | Status: DC

## 2020-05-17 NOTE — Telephone Encounter (Signed)
90 day supply and refills sent to [facility]. 2 week supply [facility].

## 2020-05-17 NOTE — Telephone Encounter (Signed)
[facility] states [facility] still waiting on authorization for a 90 days supply. Patient has 4 pills left.    Please sent a 90 days supply    metoprolol tartrate (LOPRESSOR) 50 MG tablet [[id number]]  [facility] [place], [place]  [address] [place] [address]  Phone:  [phone] Fax:  [phone]   2 weeks supply to local pharmacy  [id number] [place], [place] Phone:  [phone]  Fax:  [phone]

## 2020-05-21 ENCOUNTER — Ambulatory Visit
Admission: EM | Admit: 2020-05-21 | Discharge: 2020-05-21 | Disposition: A | Discharge status: Home / Self Care | Payer: Managed Care, Other (non HMO) | Attending: Emergency Medicine | Admitting: Emergency Medicine

## 2020-05-21 ENCOUNTER — Other Ambulatory Visit: Payer: Self-pay

## 2020-05-21 DIAGNOSIS — J029 Acute pharyngitis, unspecified: Secondary | ICD-10-CM

## 2020-05-21 DIAGNOSIS — R0981 Nasal congestion: Secondary | ICD-10-CM | POA: Diagnosis not present

## 2020-05-21 DIAGNOSIS — H9201 Otalgia, right ear: Secondary | ICD-10-CM

## 2020-05-21 MED ORDER — PREDNISONE 20 MG PO TABS: 40.0000 mg | ORAL_TABLET | Freq: Every day | ORAL | 0 refills | Status: AC

## 2020-05-21 MED ORDER — AMOXICILLIN 875 MG PO TABS: 875.0000 mg | ORAL_TABLET | Freq: Two times a day (BID) | ORAL | 0 refills | Status: AC

## 2020-05-21 NOTE — ED Provider Notes (Signed)
[facility]    CSN: [id number] Arrival date & time: 05/21/20  1302      History   Chief Complaint Chief Complaint  Patient presents with  . Otalgia  . Sore Throat    HPI Julian Washington is a 40 y.o. male history of DM type II, migraines, GERD, presenting today for evaluation of ear pain and sore throat.  Reports over the past 2 to 3 days has developed discomfort in his right ear as well as sore throat and congestion.  Reports history of recurrent ear infections, and feels similar.  At times has improved with steroids alone.  Is on daily Zyrtec and Flonase regularly.  HPI  Past Medical History:  Diagnosis Date  . ADHD   . Anemia   . Anxiety and depression    h/o suicidality w/ citalopram  . Diabetes mellitus without complication ([place])   . Difficult intubation   . ETD (eustachian tube dysfunction)    s/p ENT, declined ear tuves before   . GERD (gastroesophageal reflux disease)   . History of chicken pox    Titered on 08/16/2005  . Hypogonadism male    Dr Cruzita Lederer, used to see urology  . Infertility male   . Internal hemorrhoids    s/p banding  . Low testosterone   . Migraine    on topamax  . OSA on CPAP   . Palpitations   . 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[place])    h/o increased prolactin, Dr Cruzita Lederer (previously @ [facility])  . Prediabetes    A1C 6.2 years ago  . 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 Vitamin B 12 deficiency    h/o  . Vitamin D deficiency     Patient Active Problem List   Diagnosis Date Noted  . Abnormal finding on lung imaging 06/26/2018  . ADHD 02/28/2018  . Chronic venous insufficiency of lower extremity 02/28/2018  . Psoriasis 02/24/2018  . Splenomegaly 09/24/2017  . 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  . Absolute anemia 12/04/2016  . Type 2 diabetes mellitus without complication, without long-term current use of insulin ([place]) 09/25/2016  . Morbid obesity ([place]) 09/25/2016  . PCP NOTES >>> 11/15/2014  . H/O: pituitary  tumor 01/28/2014  . Annual physical exam 07/13/2013  . Anxiety and depression   . Vitamin B 12 deficiency   . 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 Migraine   . OSA on CPAP   . Vitamin D deficiency   . Internal hemorrhoids   . GERD 06/02/2009    Past Surgical History:  Procedure Laterality Date  . 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 TONSILLECTOMY AND ADENOIDECTOMY         Home Medications    Prior to Admission medications   Medication Sig Start Date End Date Taking? Authorizing Provider  amoxicillin (AMOXIL) 875 MG tablet Take 1 tablet (875 mg total) by mouth 2 (two) times daily for 7 days. 05/21/20 05/28/20 Yes Ladasha Schnackenberg C, PA-C  predniSONE (DELTASONE) 20 MG tablet Take 2 tablets (40 mg total) by mouth daily with breakfast for 5 days. 05/21/20 05/26/20 Yes Hobert Poplaski C, PA-C  ACCU-CHEK FASTCLIX LANCETS MISC 1 Package by Does not apply route 2 (two) times daily. 04/23/17   Waldon Merl, PA-C  albuterol (VENTOLIN HFA) 108 (90 Base) MCG/ACT inhaler Inhale 2 puffs into the lungs every 4 (four) hours as needed for wheezing or shortness of breath. 09/26/19   Hall-Potvin, [place], PA-C  Ascorbic Acid (VITAMIN C PO) Take by mouth.    [provider]  Atomoxetine HCl (STRATTERA PO) Take by mouth.    [provider]  clotrimazole-betamethasone (LOTRISONE) cream Apply 1 application topically 2 (two) times daily. 03/15/20   Edrick Kins, DPM  CONCERTA 18 MG CR tablet  02/22/18   [provider]  fexofenadine (ALLEGRA) 180 MG tablet Take 180 mg by mouth daily.    [provider]  fluticasone (FLONASE) 50 MCG/ACT nasal spray Place 2 sprays into both nostrils daily.    [provider]  furosemide (LASIX) 20 MG tablet Take 1 tablet (20 mg total) by mouth daily. 12/14/19   Colon Branch, MD  glucose blood (ACCU-CHEK GUIDE) test strip 1 each by Other route 2 (two) times daily. Use as instructed 04/23/17   Waldon Merl,  PA-C  Magnesium Oxide (MAG-OXIDE PO) Take 500 mg by mouth daily.    [provider]  metFORMIN (GLUMETZA) 1000 MG (MOD) 24 hr tablet TAKE 2 TABLETS (2,000 MG TOTAL) DAILY 07/15/18   Philemon Kingdom, MD  METHYLPHENIDATE PO Take by mouth.    [provider]  metoprolol tartrate (LOPRESSOR) 50 MG tablet Take 1.5 tablets (75 mg total) by mouth 2 (two) times daily. 05/17/20   Colon Branch, MD  pantoprazole (PROTONIX) 40 MG tablet Take 1 tablet (40 mg total) by mouth daily. 05/08/20   Colon Branch, MD  SKYRIZI, 150 MG DOSE, 75 MG/0.83ML PSKT  04/09/19   [provider]    Family History Family History  Problem Relation Age of Onset  . Obesity Mother   . Depression Father   . Anxiety disorder Father   . Bipolar disorder Father   . Drug abuse Father   . Cancer Maternal Grandfather        GBM   . Diabetes Paternal Grandmother   . Hypertension Paternal Grandmother   . Diabetes Paternal Grandfather   . Cancer Paternal Grandfather        prostate cancer  . Prostate cancer Other        GF  . CAD Other        PGF  . Colon cancer Neg Hx     Social History Social History   Tobacco Use  . Smoking status: Never Smoker  . Smokeless tobacco: Never Used  Vaping Use  . Vaping Use: Never used  Substance Use Topics  . Alcohol use: Not Currently    Comment: none x 10 yrs  . Drug use: No     Allergies   Levaquin [levofloxacin in d5w], Celexa [citalopram], Dextrans, Hydrocodone-acetaminophen, Latex, and Oxycodone   Review of Systems Review of Systems  Constitutional: Negative for activity change, appetite change, chills, fatigue and fever.  HENT: Positive for congestion, ear pain and sore throat. Negative for rhinorrhea, sinus pressure and trouble swallowing.   Eyes: Negative for discharge and redness.  Respiratory: Negative for cough, chest tightness and shortness of breath.   Cardiovascular: Negative for chest pain.  Gastrointestinal: Negative for abdominal pain,  diarrhea, nausea and vomiting.  Musculoskeletal: Negative for myalgias.  Skin: Negative for rash.  Neurological: Negative for dizziness, light-headedness and headaches.     Physical Exam Triage Vital Signs ED Triage Vitals  Enc Vitals Group     BP      Pulse      Resp      Temp      Temp src      SpO2  Weight      Height      Head Circumference      Peak Flow      Pain Score      Pain Loc      Pain Edu?      Excl. in [place]?    No data found.  Updated Vital Signs BP (!) 154/86   Pulse 81   Temp 97.7 F (36.5 C)   Resp 20   SpO2 96%   Visual Acuity Right Eye Distance:   Left Eye Distance:   Bilateral Distance:    Right Eye Near:   Left Eye Near:    Bilateral Near:     Physical Exam Vitals and nursing note reviewed.  Constitutional:      Appearance: He is well-developed.     Comments: No acute distress  HENT:     Head: Normocephalic and atraumatic.     Ears:     Comments: Right TM with opaque scarring, no erythema, left TM intact, bilateral canal slightly erythematous and swollen, no pain to manipulation of external auricle/tragus     Nose: Nose normal.     Mouth/Throat:     Comments: Oral mucosa pink and moist, no tonsillar enlargement or exudate. Posterior pharynx patent and nonerythematous, no uvula deviation or swelling. Normal phonation. Eyes:     Conjunctiva/sclera: Conjunctivae normal.  Cardiovascular:     Rate and Rhythm: Normal rate.  Pulmonary:     Effort: Pulmonary effort is normal. No respiratory distress.     Comments: Breathing comfortably at rest, CTABL, no wheezing, rales or other adventitious sounds auscultated Abdominal:     General: There is no distension.  Musculoskeletal:        General: Normal range of motion.     Cervical back: Neck supple.  Skin:    General: Skin is warm and dry.  Neurological:     Mental Status: He is alert and oriented to person, place, and time.      UC Treatments / Results  Labs (all labs  ordered are listed, but only abnormal results are displayed) Labs Reviewed - No data to display  EKG   Radiology No results found.  Procedures Procedures (including critical care time)  Medications Ordered in UC Medications - No data to display  Initial Impression / Assessment and Plan / UC Course  I have reviewed the triage vital signs and the nursing notes.  Pertinent labs & imaging results that were available during my care of the patient were reviewed by me and considered in my medical decision making (see chart for details).     No obvious sign of otitis media/externa at this time, canals do appear erythematous and swollen, but no pain to touch, will treat with prednisone course to add in with daily Allegra and Flonase.  Given patient's history of recurrent otitis did opt to go ahead and send in prescription for amoxicillin to fill if not having improvement with steroids alone in 2 to 3 days.  Otherwise continue symptomatic and supportive care rest and fluids.  Discussed strict return precautions. Patient verbalized understanding and is agreeable with plan.  Final Clinical Impressions(s) / UC Diagnoses   Final diagnoses:  Nasal congestion  Sore throat  Otalgia of right ear     Discharge Instructions     Continue daily Allegra, Flonase Begin prednisone 40 mg daily with food for the next 5 days  Please hold off on antibiotic unless symptoms not improving with steroids over the  next 2 to 3 days     ED Prescriptions    Medication Sig Dispense Auth. Provider   predniSONE (DELTASONE) 20 MG tablet Take 2 tablets (40 mg total) by mouth daily with breakfast for 5 days. 10 tablet Rigby Leonhardt C, PA-C   amoxicillin (AMOXIL) 875 MG tablet Take 1 tablet (875 mg total) by mouth 2 (two) times daily for 7 days. 14 tablet Tyrique Sporn, [place] C, PA-C     PDMP not reviewed this encounter.   Janith Lima, [place] 05/21/20 1417

## 2020-05-21 NOTE — ED Triage Notes (Signed)
Pt presents with right ear pain and sore throat since Friday

## 2020-05-21 NOTE — Discharge Instructions (Addendum)
Continue daily Allegra, Flonase Begin prednisone 40 mg daily with food for the next 5 days  Please hold off on antibiotic unless symptoms not improving with steroids over the next 2 to 3 days

## 2020-05-22 ENCOUNTER — Other Ambulatory Visit: Payer: Self-pay | Admitting: Internal Medicine

## 2020-05-25 ENCOUNTER — Other Ambulatory Visit: Payer: Self-pay | Admitting: Internal Medicine

## 2020-06-25 ENCOUNTER — Encounter: Payer: Self-pay | Admitting: Internal Medicine

## 2020-07-05 ENCOUNTER — Other Ambulatory Visit: Payer: Self-pay

## 2020-07-05 ENCOUNTER — Ambulatory Visit: Payer: Managed Care, Other (non HMO) | Admitting: Podiatry

## 2020-07-05 DIAGNOSIS — B351 Tinea unguium: Secondary | ICD-10-CM

## 2020-07-05 DIAGNOSIS — M79674 Pain in right toe(s): Secondary | ICD-10-CM

## 2020-07-05 DIAGNOSIS — M79675 Pain in left toe(s): Secondary | ICD-10-CM | POA: Diagnosis not present

## 2020-07-05 NOTE — Progress Notes (Signed)
   SUBJECTIVE Patient presents to office today complaining of elongated, thickened nails that cause pain while ambulating in shoes.  He is unable to trim his own nails, and also there is a concern for being prediabetic. Patient is here for further evaluation and treatment.  Past Medical History:  Diagnosis Date  . ADHD   . Anemia   . Anxiety and depression    h/o suicidality w/ citalopram  . Diabetes mellitus without complication ([place])   . Difficult intubation   . ETD (eustachian tube dysfunction)    s/p ENT, declined ear tuves before   . GERD (gastroesophageal reflux disease)   . History of chicken pox    Titered on 08/16/2005  . Hypogonadism male    Dr Cruzita Lederer, used to see urology  . Infertility male   . Internal hemorrhoids    s/p banding  . Low testosterone   . Migraine    on topamax  . OSA on CPAP   . Palpitations   . 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 Prediabetes    A1C 6.2 years ago  . 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 Vitamin B 12 deficiency    h/o  . Vitamin D deficiency     OBJECTIVE General Patient is awake, alert, and oriented x 3 and in no acute distress. Derm Skin is dry and supple bilateral. Negative open lesions or macerations. Remaining integument unremarkable. Nails are tender, long, thickened and dystrophic with subungual debris, consistent with onychomycosis, 1-5 bilateral especially to the left hallux nail plate. No signs of infection noted. Vasc  DP and PT pedal pulses palpable bilaterally. Temperature gradient within normal limits.  Neuro Epicritic and protective threshold sensation grossly intact bilaterally.  Musculoskeletal Exam No symptomatic pedal deformities noted bilateral. Muscular strength within normal limits.  ASSESSMENT 1.  Pain due to onychomycosis of toenails both  PLAN OF CARE 1. Patient evaluated today.  2. Instructed to maintain good pedal hygiene and  foot care.  3. Mechanical debridement of nails 1-5 bilaterally performed using a nail nipper. Filed with dremel without incident.  4. Return to clinic in 3 mos.    Edrick Kins, DPM [facility]  Dr. Edrick Kins, DPM    [address], [place] [address]                Office [phone]  Fax [phone]

## 2020-07-11 ENCOUNTER — Encounter (HOSPITAL_BASED_OUTPATIENT_CLINIC_OR_DEPARTMENT_OTHER): Payer: Self-pay

## 2020-07-11 ENCOUNTER — Emergency Department (HOSPITAL_BASED_OUTPATIENT_CLINIC_OR_DEPARTMENT_OTHER): Payer: Managed Care, Other (non HMO) | Admitting: Radiology

## 2020-07-11 ENCOUNTER — Other Ambulatory Visit: Payer: Self-pay

## 2020-07-11 ENCOUNTER — Emergency Department (HOSPITAL_BASED_OUTPATIENT_CLINIC_OR_DEPARTMENT_OTHER)
Admission: EM | Admit: 2020-07-11 | Discharge: 2020-07-11 | Disposition: A | Discharge status: Home / Self Care | Payer: Managed Care, Other (non HMO) | Attending: Emergency Medicine | Admitting: Emergency Medicine

## 2020-07-11 DIAGNOSIS — Z7984 Long term (current) use of oral hypoglycemic drugs: Secondary | ICD-10-CM | POA: Insufficient documentation

## 2020-07-11 DIAGNOSIS — R509 Fever, unspecified: Secondary | ICD-10-CM | POA: Diagnosis not present

## 2020-07-11 DIAGNOSIS — M791 Myalgia, unspecified site: Secondary | ICD-10-CM | POA: Diagnosis present

## 2020-07-11 DIAGNOSIS — R059 Cough, unspecified: Secondary | ICD-10-CM | POA: Diagnosis not present

## 2020-07-11 DIAGNOSIS — E119 Type 2 diabetes mellitus without complications: Secondary | ICD-10-CM | POA: Insufficient documentation

## 2020-07-11 DIAGNOSIS — Z91040 Latex allergy status: Secondary | ICD-10-CM | POA: Diagnosis not present

## 2020-07-11 LAB — COMPREHENSIVE METABOLIC PANEL
ALT: 14 U/L (ref 0–44)
AST: 11 U/L — ABNORMAL LOW (ref 15–41)
Albumin: 3.9 g/dL (ref 3.5–5.0)
Alkaline Phosphatase: 66 U/L (ref 38–126)
Anion gap: 9 (ref 5–15)
BUN: 15 mg/dL (ref 6–20)
CO2: 23 mmol/L (ref 22–32)
Calcium: 8.1 mg/dL — ABNORMAL LOW (ref 8.9–10.3)
Chloride: 103 mmol/L (ref 98–111)
Creatinine, Ser: 0.76 mg/dL (ref 0.61–1.24)
GFR, Estimated: >60 mL/min (ref >60)
Glucose, Bld: 128 mg/dL — ABNORMAL HIGH (ref 70–99)
Potassium: 3.8 mmol/L (ref 3.5–5.1)
Sodium: 135 mmol/L (ref 135–145)
Total Bilirubin: 0.5 mg/dL (ref 0.3–1.2)
Total Protein: 6 g/dL — ABNORMAL LOW (ref 6.5–8.1)

## 2020-07-11 LAB — CBC WITH DIFFERENTIAL/PLATELET
Abs Immature Granulocytes: 0.05 10*3/uL (ref 0.00–0.07)
Basophils Absolute: 0 10*3/uL (ref 0.0–0.1)
Basophils Relative: 1 %
Eosinophils Absolute: 0 10*3/uL (ref 0.0–0.5)
Eosinophils Relative: 0 %
HCT: 36.9 % — ABNORMAL LOW (ref 39.0–52.0)
Hemoglobin: 12.2 g/dL — ABNORMAL LOW (ref 13.0–17.0)
Immature Granulocytes: 1 %
Lymphocytes Relative: 13 %
Lymphs Abs: 1.1 10*3/uL (ref 0.7–4.0)
MCH: 26.2 pg (ref 26.0–34.0)
MCHC: 33.1 g/dL (ref 30.0–36.0)
MCV: 79.4 fL — ABNORMAL LOW (ref 80.0–100.0)
Monocytes Absolute: 0.4 10*3/uL (ref 0.1–1.0)
Monocytes Relative: 5 %
Neutro Abs: 6.8 10*3/uL (ref 1.7–7.7)
Neutrophils Relative %: 80 %
Platelets: 165 10*3/uL (ref 150–400)
RBC: 4.65 MIL/uL (ref 4.22–5.81)
RDW: 13.2 % (ref 11.5–15.5)
WBC: 8.4 10*3/uL (ref 4.0–10.5)
nRBC: 0 % (ref 0.0–0.2)

## 2020-07-11 LAB — LIPASE, BLOOD: Lipase: 18 U/L (ref 11–51)

## 2020-07-11 MED ORDER — ACETAMINOPHEN 325 MG PO TABS
650.0000 mg | ORAL_TABLET | Freq: Once | ORAL | Status: AC
  Administered 2020-07-11: 650 mg via ORAL
  Filled 2020-07-11: qty 2

## 2020-07-11 MED ORDER — SODIUM CHLORIDE 0.9 % IV BOLUS
1000.0000 mL | Freq: Once | INTRAVENOUS | Status: AC
  Administered 2020-07-11: 1000 mL via INTRAVENOUS

## 2020-07-11 NOTE — ED Triage Notes (Signed)
Pt arrived via POV for lower back pain and fever. Pt states he tested positive for covid 16 days ago and today he spiked a fever of 102.3 pt states he took 650 mg of tylenol this afternoon before coming in.

## 2020-07-11 NOTE — ED Provider Notes (Signed)
[facility] DE[facility] Provider Note   CSN: [id number] Arrival date & time: 07/11/20  2013     History Chief Complaint  Patient presents with  . Back Pain  . Fever    Julian Washington is a 40 y.o. male.  The history is provided by the patient.  Fever Temp source:  Oral Severity:  Mild Onset quality:  Gradual Timing:  Intermittent Progression:  Waxing and waning Chronicity:  New Relieved by:  Ibuprofen Worsened by:  Nothing Associated symptoms: chills, cough and myalgias   Associated symptoms: no chest pain, no dysuria, no ear pain, no rash, no sore throat and no vomiting   Risk factors: immunosuppression        Past Medical History:  Diagnosis Date  . ADHD   . Anemia   . Anxiety and depression    h/o suicidality w/ citalopram  . Diabetes mellitus without complication ([place])   . Difficult intubation   . ETD (eustachian tube dysfunction)    s/p ENT, declined ear tuves before   . GERD (gastroesophageal reflux disease)   . History of chicken pox    Titered on 08/16/2005  . Hypogonadism male    Dr Cruzita Lederer, used to see urology  . Infertility male   . Internal hemorrhoids    s/p banding  . Low testosterone   . Migraine    on topamax  . OSA on CPAP   . Palpitations   . 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[place])    h/o increased prolactin, Dr Cruzita Lederer (previously @ [facility])  . Prediabetes    A1C 6.2 years ago  . 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 Vitamin B 12 deficiency    h/o  . Vitamin D deficiency     Patient Active Problem List   Diagnosis Date Noted  . Abnormal finding on lung imaging 06/26/2018  . ADHD 02/28/2018  . Chronic venous insufficiency of lower extremity 02/28/2018  . Psoriasis 02/24/2018  . Splenomegaly 09/24/2017  . 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  . Absolute anemia 12/04/2016  . Type 2 diabetes mellitus without complication, without long-term current use of insulin ([place]) 09/25/2016  . Morbid obesity ([place])  09/25/2016  . PCP NOTES >>> 11/15/2014  . H/O: pituitary tumor 01/28/2014  . Annual physical exam 07/13/2013  . Anxiety and depression   . Vitamin B 12 deficiency   . 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 Migraine   . OSA on CPAP   . Vitamin D deficiency   . Internal hemorrhoids   . GERD 06/02/2009    Past Surgical History:  Procedure Laterality Date  . 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 TONSILLECTOMY AND ADENOIDECTOMY         Family History  Problem Relation Age of Onset  . Obesity Mother   . Depression Father   . Anxiety disorder Father   . Bipolar disorder Father   . Drug abuse Father   . Cancer Maternal Grandfather        GBM   . Diabetes Paternal Grandmother   . Hypertension Paternal Grandmother   . Diabetes Paternal Grandfather   . Cancer Paternal Grandfather        prostate cancer  . Prostate cancer Other        GF  . CAD Other        PGF  . Colon cancer Neg Hx     Social History   Tobacco Use  . Smoking  status: Never Smoker  . Smokeless tobacco: Never Used  Vaping Use  . Vaping Use: Never used  Substance Use Topics  . Alcohol use: Not Currently    Comment: none x 10 yrs  . Drug use: No    Home Medications Prior to Admission medications   Medication Sig Start Date End Date Taking? Authorizing Provider  ACCU-CHEK FASTCLIX LANCETS MISC 1 Package by Does not apply route 2 (two) times daily. 04/23/17   Waldon Merl, PA-C  albuterol (VENTOLIN HFA) 108 (90 Base) MCG/ACT inhaler Inhale 2 puffs into the lungs every 4 (four) hours as needed for wheezing or shortness of breath. 09/26/19   Hall-Potvin, [place], PA-C  Ascorbic Acid (VITAMIN C PO) Take by mouth.    [provider]  Atomoxetine HCl (STRATTERA PO) Take by mouth.    [provider]  clotrimazole-betamethasone (LOTRISONE) cream Apply 1 application topically 2 (two) times daily. 03/15/20   Edrick Kins, DPM  CONCERTA 18 MG CR tablet  02/22/18    [provider]  fexofenadine (ALLEGRA) 180 MG tablet Take 180 mg by mouth daily.    [provider]  fluticasone (FLONASE) 50 MCG/ACT nasal spray Place 2 sprays into both nostrils daily.    [provider]  furosemide (LASIX) 20 MG tablet Take 1 tablet (20 mg total) by mouth daily. 12/14/19   Colon Branch, MD  glucose blood (ACCU-CHEK GUIDE) test strip 1 each by Other route 2 (two) times daily. Use as instructed 04/23/17   Waldon Merl, PA-C  Magnesium Oxide (MAG-OXIDE PO) Take 500 mg by mouth daily.    [provider]  metFORMIN (GLUMETZA) 1000 MG (MOD) 24 hr tablet TAKE 2 TABLETS (2,000 MG TOTAL) DAILY 07/15/18   Philemon Kingdom, MD  METHYLPHENIDATE PO Take by mouth.    [provider]  metoprolol tartrate (LOPRESSOR) 50 MG tablet Take 1 tablet (50 mg total) by mouth 2 (two) times daily. 05/25/20   Colon Branch, MD  pantoprazole (PROTONIX) 40 MG tablet Take 1 tablet (40 mg total) by mouth daily. 05/08/20   Colon Branch, MD  SKYRIZI, 150 MG DOSE, 75 MG/0.83ML PSKT  04/09/19   [provider]    Allergies    Levaquin [levofloxacin in d5w], Celexa [citalopram], Dextrans, Hydrocodone-acetaminophen, Latex, and Oxycodone  Review of Systems   Review of Systems  Constitutional: Positive for chills and fever.  HENT: Negative for ear pain and sore throat.   Eyes: Negative for pain and visual disturbance.  Respiratory: Positive for cough. Negative for shortness of breath.   Cardiovascular: Negative for chest pain and palpitations.  Gastrointestinal: Negative for abdominal pain and vomiting.  Genitourinary: Negative for dysuria and hematuria.  Musculoskeletal: Positive for arthralgias, back pain and myalgias.  Skin: Negative for color change and rash.  Neurological: Negative for seizures and syncope.  All other systems reviewed and are negative.   Physical Exam Updated Vital Signs BP (!) 170/86 (BP Location: Left Arm)   Pulse 95   Temp 98.2 F  (36.8 C) (Oral)   Resp 20   Ht 5\' 11"  (1.803 m)   Wt (!) 172.4 kg   SpO2 100%   BMI 53.00 kg/m   Physical Exam Vitals and nursing note reviewed.  Constitutional:      General: He is not in acute distress.    Appearance: He is well-developed. He is not ill-appearing.  HENT:     Head: Normocephalic and atraumatic.     Nose: Nose  normal.     Mouth/Throat:     Mouth: Mucous membranes are moist.  Eyes:     Extraocular Movements: Extraocular movements intact.     Conjunctiva/sclera: Conjunctivae normal.     Pupils: Pupils are equal, round, and reactive to light.  Cardiovascular:     Rate and Rhythm: Normal rate and regular rhythm.     Pulses: Normal pulses.     Heart sounds: Normal heart sounds. No murmur heard.   Pulmonary:     Effort: Pulmonary effort is normal. No respiratory distress.     Breath sounds: Normal breath sounds.  Abdominal:     General: There is no distension.     Palpations: Abdomen is soft.     Tenderness: There is no abdominal tenderness.  Musculoskeletal:        General: No tenderness.     Cervical back: Normal range of motion and neck supple.  Skin:    General: Skin is warm and dry.     Capillary Refill: Capillary refill takes less than 2 seconds.  Neurological:     General: No focal deficit present.     Mental Status: He is alert.     ED Results / Procedures / Treatments   Labs (all labs ordered are listed, but only abnormal results are displayed) Labs Reviewed  CBC WITH DIFFERENTIAL/PLATELET - Abnormal; Notable for the following components:      Result Value   Hemoglobin 12.2 (*)    HCT 36.9 (*)    MCV 79.4 (*)    All other components within normal limits  COMPREHENSIVE METABOLIC PANEL - Abnormal; Notable for the following components:   Glucose, Bld 128 (*)    Calcium 8.1 (*)    Total Protein 6.0 (*)    AST 11 (*)    All other components within normal limits  LIPASE, BLOOD    EKG None  Radiology DG Chest 2 View  Result Date:  07/11/2020 CLINICAL DATA:  Cough EXAM: CHEST - 2 VIEW COMPARISON:  02/05/2016 FINDINGS: Heart and mediastinal contours are within normal limits. No focal opacities or effusions. No acute bony abnormality. IMPRESSION: Negative. Electronically Signed   By: Rolm Baptise M.D.   On: 07/11/2020 21:50    Procedures Procedures   Medications Ordered in ED Medications  sodium chloride 0.9 % bolus 1,000 mL (1,000 mLs Intravenous New Bag/Given 07/11/20 2141)  acetaminophen (TYLENOL) tablet 650 mg (650 mg Oral Given 07/11/20 2056)    ED Course  I have reviewed the triage vital signs and the nursing notes.  Pertinent labs & imaging results that were available during my care of the patient were reviewed by me and considered in my medical decision making (see chart for details).    MDM Rules/Calculators/A&P                          Harl Favor is here with body aches, fever.  Recently positive for COVID 16 days ago.  Overall unremarkable vitals here.  He is on immunologic for rheumatological process.  Overall he denies any pain with urination, abdominal pain.  Has had a cough.  Concern for possible pneumonia.  Chest x-ray however shows no evidence of pneumonia or infectious process.  No significant leukocytosis, anemia, electrolyte abnormality.  Not having any urinary symptoms.  Was given fluid bolus with improvement.  Recommend continued use of Tylenol and ibuprofen at home.  Overall suspect viral process and discharged in good condition.  This chart was dictated using voice recognition software.  Despite best efforts to proofread,  errors can occur which can change the documentation meaning.    Final Clinical Impression(s) / ED Diagnoses Final diagnoses:  Myalgia    Rx / DC Orders ED Discharge Orders    None       Lennice Sites, DO 07/11/20 2231

## 2020-07-11 NOTE — Discharge Instructions (Addendum)
Recommend continued use of Tylenol for fever and pain.  Follow-up with your primary care doctor if symptoms are persistent or return for follow-up if symptoms are worsening as well.

## 2020-07-28 ENCOUNTER — Encounter: Payer: Self-pay | Admitting: Internal Medicine

## 2020-07-28 ENCOUNTER — Other Ambulatory Visit: Payer: Self-pay

## 2020-07-28 ENCOUNTER — Ambulatory Visit: Payer: Managed Care, Other (non HMO) | Admitting: Internal Medicine

## 2020-07-28 VITALS — BP 134/92 | HR 76 | Temp 98.0°F | Resp 18 | Ht 71.0 in | Wt 383.0 lb

## 2020-07-28 DIAGNOSIS — I872 Venous insufficiency (chronic) (peripheral): Secondary | ICD-10-CM

## 2020-07-28 DIAGNOSIS — I10 Essential (primary) hypertension: Secondary | ICD-10-CM

## 2020-07-28 DIAGNOSIS — E119 Type 2 diabetes mellitus without complications: Secondary | ICD-10-CM | POA: Diagnosis not present

## 2020-07-28 DIAGNOSIS — Z7185 Encounter for immunization safety counseling: Secondary | ICD-10-CM

## 2020-07-28 LAB — HEMOGLOBIN A1C: Hgb A1c MFr Bld: 6.8 % — ABNORMAL HIGH (ref 4.6–6.5)

## 2020-07-28 MED ORDER — METOPROLOL TARTRATE 100 MG PO TABS: 100.0000 mg | ORAL_TABLET | Freq: Two times a day (BID) | ORAL | 1 refills | Status: DC

## 2020-07-28 NOTE — Progress Notes (Signed)
Subjective:    Patient ID: Julian Washington, male    DOB: 05-16-1980, 40 y.o.   MRN: [id number]  DOS:  07/28/2020 Type of visit - description: F/U  Since the last visit, self diagnosed with COVID with a home test on 06/25/2020. Went to the ER several days later with episode of fever, work-up negative, received IV fluids.  After the ER visit he developed diarrhea and he thinks that after all, what he had was a stomach virus. He feels well now.  Today we also talk about hypertension, chronic dermatitis, DM and vaccinations  Wt Readings from Last 3 Encounters:  07/28/20 (!) 383 lb (173.7 kg)  07/11/20 (!) 380 lb (172.4 kg)  04/27/20 (!) 395 lb 2 oz (179.2 kg)    Review of Systems See above   Past Medical History:  Diagnosis Date   ADHD    Anemia    Anxiety and depression    h/o suicidality w/ citalopram   Diabetes mellitus without complication ([place])    Difficult intubation    ETD (eustachian tube dysfunction)    s/p ENT, declined ear tuves before    GERD (gastroesophageal reflux disease)    History of chicken pox    Titered on 08/16/2005   Hypogonadism male    Dr Cruzita Lederer, used to see urology   Infertility male    Internal hemorrhoids    s/p banding   Low testosterone    Migraine    on topamax   OSA on CPAP    Palpitations    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[place])    h/o increased prolactin, Dr Cruzita Lederer (previously @ [facility])   Prediabetes    A1C 6.2 years ago   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   Vitamin B 12 deficiency    h/o   Vitamin D deficiency     Past Surgical History:  Procedure Laterality Date   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-04-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, ACTH   TONSILLECTOMY AND ADENOIDECTOMY      Allergies as of 07/28/2020       Reactions   Levaquin [levofloxacin In D5w] Swelling   Celexa [citalopram] Other (See Comments)   Mental status changes    Dextrans Other (See Comments)   Makes the pt. Drowsy.    Hydrocodone-acetaminophen    REACTION: itching    Latex    Oxycodone Itching        Medication List        Accurate as of July 28, 2020 11:59 PM. If you have any questions, ask your nurse or doctor.          Accu-Chek FastClix Lancets Misc 1 Package by Does not apply route 2 (two) times daily.   albuterol 108 (90 Base) MCG/ACT inhaler Commonly known as: VENTOLIN HFA Inhale 2 puffs into the lungs every 4 (four) hours as needed for wheezing or shortness of breath.   clotrimazole-betamethasone cream Commonly known as: Lotrisone Apply 1 application topically 2 (two) times daily.   Concerta 18 MG CR tablet Generic drug: methylphenidate   fexofenadine 180 MG tablet Commonly known as: ALLEGRA Take 180 mg by mouth daily.   fluticasone 50 MCG/ACT nasal spray Commonly known as: FLONASE Place 2 sprays into both nostrils daily.   furosemide 20 MG tablet Commonly known as: LASIX Take 1 tablet (20 mg total) by mouth daily.   glucose blood test strip Commonly known as: Accu-Chek Guide 1 each by Other route 2 (two) times daily. Use as  instructed   MAG-OXIDE PO Take 500 mg by mouth daily.   metFORMIN 1000 MG (MOD) 24 hr tablet Commonly known as: GLUMETZA TAKE 2 TABLETS (2,000 MG TOTAL) DAILY   METHYLPHENIDATE PO Take by mouth.   metoprolol tartrate 100 MG tablet Commonly known as: LOPRESSOR Take 1 tablet (100 mg total) by mouth 2 (two) times daily. What changed:  medication strength how much to take Changed by: Kathlene November, MD   pantoprazole 40 MG tablet Commonly known as: PROTONIX Take 1 tablet (40 mg total) by mouth daily.   Skyrizi (150 MG Dose) 75 MG/0.83ML Pskt Generic drug: Risankizumab-rzaa(150 MG Dose)   STRATTERA PO Take by mouth.   VITAMIN C PO Take by mouth.           Objective:   Physical Exam BP (!) 134/92 (BP Location: Left Arm, Patient Position: Sitting, Cuff Size: Normal)   Pulse 76   Temp 98 F (36.7 C) (Oral)   Resp 18   Ht 5\' 11"  (1.803 m)   Wt (!) 383 lb (173.7 kg)   SpO2 96%    BMI 53.42 kg/m  General:   Well developed, NAD, BMI noted. HEENT:  Normocephalic . Face symmetric, atraumatic Lungs:  CTA B Normal respiratory effort, no intercostal retractions, no accessory muscle use. Heart: RRR,  no murmur.  Lower extremities: Chronic dermatitis and swelling at baseline Neurologic:  alert & oriented X3.  Speech normal, gait appropriate for age and unassisted Psych--  Cognition and judgment appear intact.  Cooperative with normal attention span and concentration.  Behavior appropriate. No anxious or depressed appearing.      Assessment     Assessment   Prediabetes-- on metformin x years  Chronic venous insufficiency/lymphedema/stasis dermatitis PSYCH: --Anxiety depression (suicidality w/  Citalopram) --ADHD Dx 2019, Dr Johnnye Sima Morbid obesity Vitamin D and  B12 deficiency Endocrinology:Dr Gherghe --Pituitary mass, surgery : Prolactinoma --Hypogonadism hypogonadotropic --Infertility Migraines, d/c Topamax 05-2015 (pt concerned about short term memory)  OSA on CPAP -- DR Halford Chessman Hematology -Mild chronic anemia, no previous colonoscopy or EGD. No symptoms. Iron , vitamins normal.  saw hematology 09/2017  -Splenomegaly: Per CT 09/20/2017, saw hematology --Abdominal lymphadenopathies: Felt to be stable, see hematology note from July 14, 2019. Pulmonary nodules: Felt to be stable per last CT. DERM: Psoriasis  H/o ET  dysfunction, declined ear tubes H/o hemorrhoids s/p  banding H/o abd pain 2012 (w/u CT, [place], a HIDA scan), similar sx 02-2015 H/o  difficulty intubation COVID infex 06/2020  PLAN: DM: On metformin, check A1c. HTN: needs better control. @ last OV metoprolol dose increased to 75 mg twice daily, ambulatory BPs 130, 140.  Plan: Increase metoprolol 100 mg twice daily, monitor BPs. Morbid obesity: He has a new puppy, trying to be more active, he finished school and has more time for himself, encouraged to eat healthier and stay active. Chronic  venous insufficiency, lymphedema, stasis dermatitis: Skin care recommended. COVID-vaccine: dx of COVID on May 2022. Vaccine recommended  EVUSHELD : pt is was referred , he d/w the evusheld team but declined. RTC 01-2021 CPX   This visit occurred during the SARS-CoV-2 public health emergency.  Safety protocols were in place, including screening questions prior to the visit, additional usage of staff PPE, and extensive cleaning of exam room while observing appropriate contact time as indicated for disinfecting solutions.

## 2020-07-28 NOTE — Patient Instructions (Signed)
Increase metoprolol to 100 mg twice daily Check the  blood pressure 2 or 3 times a week BP GOAL is between 110/65 and  135/85. If it is consistently higher or lower, let me know   GO TO THE LAB : Get the blood work     [place], [place] Come back for a physical exam by December

## 2020-07-29 NOTE — Assessment & Plan Note (Signed)
DM: On metformin, check A1c. HTN: needs better control. @ last OV metoprolol dose increased to 75 mg twice daily, ambulatory BPs 130, 140.  Plan: Increase metoprolol 100 mg twice daily, monitor BPs. Morbid obesity: He has a new puppy, trying to be more active, he finished school and has more time for himself, encouraged to eat healthier and stay active. Chronic venous insufficiency, lymphedema, stasis dermatitis: Skin care recommended. COVID-vaccine: dx of COVID on May 2022. Vaccine recommended  EVUSHELD : pt is was referred , he d/w the evusheld team but declined. RTC 01-2021 CPX

## 2020-07-31 ENCOUNTER — Other Ambulatory Visit: Payer: Self-pay

## 2020-07-31 MED ORDER — METFORMIN HCL ER (MOD) 1000 MG PO TB24: ORAL_TABLET | ORAL | 1 refills | Status: DC

## 2020-08-03 ENCOUNTER — Telehealth: Payer: Self-pay | Admitting: Internal Medicine

## 2020-08-03 MED ORDER — METFORMIN HCL ER (OSM) 1000 MG PO TB24: 2000.0000 mg | ORAL_TABLET | Freq: Every day | ORAL | 1 refills | Status: DC

## 2020-08-03 NOTE — Telephone Encounter (Signed)
[facility]  calling for alternate medication for metFORMIN (GLUMETZA) 1000 MG (MOD) 24 hr tablet    Their prefer medication are   Metformin HCL tablet Metformin HCLER tablet

## 2020-08-03 NOTE — Telephone Encounter (Signed)
Caller ([facility]) Call back # [phone] Ref # [id number]

## 2020-08-03 NOTE — Telephone Encounter (Signed)
Rx corrected.

## 2020-08-07 ENCOUNTER — Telehealth: Payer: Self-pay

## 2020-08-07 ENCOUNTER — Encounter: Payer: Self-pay | Admitting: Internal Medicine

## 2020-08-07 NOTE — Telephone Encounter (Signed)
Received PA form from [place], completed and faxed back to [phone]. Awaiting determination.

## 2020-08-08 NOTE — Telephone Encounter (Signed)
PA approved from 07/08/20 to 08/07/2021.

## 2020-08-09 MED ORDER — METFORMIN HCL 1000 MG PO TABS: 1000.0000 mg | ORAL_TABLET | Freq: Two times a day (BID) | ORAL | 1 refills | Status: DC

## 2020-10-27 ENCOUNTER — Encounter: Payer: Self-pay | Admitting: Internal Medicine

## 2020-10-27 MED ORDER — METFORMIN HCL ER (OSM) 1000 MG PO TB24: 2000.0000 mg | ORAL_TABLET | Freq: Every day | ORAL | 1 refills | Status: DC

## 2020-12-02 ENCOUNTER — Observation Stay (HOSPITAL_COMMUNITY)
Admission: EM | Admit: 2020-12-02 | Discharge: 2020-12-04 | Disposition: A | Discharge status: Home / Self Care | Payer: Managed Care, Other (non HMO) | Attending: Internal Medicine | Admitting: Internal Medicine

## 2020-12-02 ENCOUNTER — Other Ambulatory Visit: Payer: Self-pay

## 2020-12-02 ENCOUNTER — Emergency Department (HOSPITAL_COMMUNITY): Payer: Managed Care, Other (non HMO)

## 2020-12-02 ENCOUNTER — Encounter (HOSPITAL_COMMUNITY): Payer: Self-pay | Admitting: Emergency Medicine

## 2020-12-02 DIAGNOSIS — E119 Type 2 diabetes mellitus without complications: Secondary | ICD-10-CM | POA: Diagnosis not present

## 2020-12-02 DIAGNOSIS — K219 Gastro-esophageal reflux disease without esophagitis: Secondary | ICD-10-CM | POA: Diagnosis present

## 2020-12-02 DIAGNOSIS — Z885 Allergy status to narcotic agent status: Secondary | ICD-10-CM | POA: Diagnosis not present

## 2020-12-02 DIAGNOSIS — I10 Essential (primary) hypertension: Secondary | ICD-10-CM | POA: Diagnosis not present

## 2020-12-02 DIAGNOSIS — Z7984 Long term (current) use of oral hypoglycemic drugs: Secondary | ICD-10-CM | POA: Diagnosis not present

## 2020-12-02 DIAGNOSIS — G43909 Migraine, unspecified, not intractable, without status migrainosus: Secondary | ICD-10-CM | POA: Diagnosis present

## 2020-12-02 DIAGNOSIS — D649 Anemia, unspecified: Secondary | ICD-10-CM | POA: Diagnosis present

## 2020-12-02 DIAGNOSIS — I878 Other specified disorders of veins: Secondary | ICD-10-CM | POA: Diagnosis present

## 2020-12-02 DIAGNOSIS — Z9151 Personal history of suicidal behavior: Secondary | ICD-10-CM | POA: Diagnosis not present

## 2020-12-02 DIAGNOSIS — I4891 Unspecified atrial fibrillation: Secondary | ICD-10-CM | POA: Diagnosis present

## 2020-12-02 DIAGNOSIS — F909 Attention-deficit hyperactivity disorder, unspecified type: Secondary | ICD-10-CM | POA: Diagnosis present

## 2020-12-02 DIAGNOSIS — I872 Venous insufficiency (chronic) (peripheral): Secondary | ICD-10-CM | POA: Diagnosis present

## 2020-12-02 DIAGNOSIS — I248 Other forms of acute ischemic heart disease: Secondary | ICD-10-CM | POA: Diagnosis present

## 2020-12-02 DIAGNOSIS — Z79899 Other long term (current) drug therapy: Secondary | ICD-10-CM | POA: Insufficient documentation

## 2020-12-02 DIAGNOSIS — G4733 Obstructive sleep apnea (adult) (pediatric): Secondary | ICD-10-CM | POA: Diagnosis present

## 2020-12-02 DIAGNOSIS — Z881 Allergy status to other antibiotic agents status: Secondary | ICD-10-CM | POA: Diagnosis not present

## 2020-12-02 DIAGNOSIS — Z91040 Latex allergy status: Secondary | ICD-10-CM | POA: Insufficient documentation

## 2020-12-02 DIAGNOSIS — Z6841 Body Mass Index (BMI) 40.0 and over, adult: Secondary | ICD-10-CM | POA: Diagnosis not present

## 2020-12-02 DIAGNOSIS — R55 Syncope and collapse: Secondary | ICD-10-CM

## 2020-12-02 DIAGNOSIS — Z9989 Dependence on other enabling machines and devices: Secondary | ICD-10-CM | POA: Diagnosis not present

## 2020-12-02 DIAGNOSIS — L409 Psoriasis, unspecified: Secondary | ICD-10-CM | POA: Diagnosis present

## 2020-12-02 DIAGNOSIS — R778 Other specified abnormalities of plasma proteins: Secondary | ICD-10-CM

## 2020-12-02 DIAGNOSIS — Z888 Allergy status to other drugs, medicaments and biological substances status: Secondary | ICD-10-CM

## 2020-12-02 DIAGNOSIS — I480 Paroxysmal atrial fibrillation: Secondary | ICD-10-CM | POA: Diagnosis present

## 2020-12-02 DIAGNOSIS — Z20822 Contact with and (suspected) exposure to covid-19: Secondary | ICD-10-CM | POA: Diagnosis not present

## 2020-12-02 DIAGNOSIS — E230 Hypopituitarism: Secondary | ICD-10-CM | POA: Diagnosis present

## 2020-12-02 DIAGNOSIS — Z833 Family history of diabetes mellitus: Secondary | ICD-10-CM

## 2020-12-02 DIAGNOSIS — Z8249 Family history of ischemic heart disease and other diseases of the circulatory system: Secondary | ICD-10-CM | POA: Diagnosis not present

## 2020-12-02 LAB — COMPREHENSIVE METABOLIC PANEL
ALT: 20 U/L (ref 0–44)
AST: 19 U/L (ref 15–41)
Albumin: 3.8 g/dL (ref 3.5–5.0)
Alkaline Phosphatase: 71 U/L (ref 38–126)
Anion gap: 9 (ref 5–15)
BUN: 16 mg/dL (ref 6–20)
CO2: 20 mmol/L — ABNORMAL LOW (ref 22–32)
Calcium: 8.9 mg/dL (ref 8.9–10.3)
Chloride: 108 mmol/L (ref 98–111)
Creatinine, Ser: 0.91 mg/dL (ref 0.61–1.24)
GFR, Estimated: >60 mL/min (ref >60)
Glucose, Bld: 132 mg/dL — ABNORMAL HIGH (ref 70–99)
Potassium: 3.9 mmol/L (ref 3.5–5.1)
Sodium: 137 mmol/L (ref 135–145)
Total Bilirubin: 0.8 mg/dL (ref 0.3–1.2)
Total Protein: 6.4 g/dL — ABNORMAL LOW (ref 6.5–8.1)

## 2020-12-02 LAB — CBC WITH DIFFERENTIAL/PLATELET
Abs Immature Granulocytes: 0.06 10*3/uL (ref 0.00–0.07)
Basophils Absolute: 0 10*3/uL (ref 0.0–0.1)
Basophils Relative: 0 %
Eosinophils Absolute: 0.1 10*3/uL (ref 0.0–0.5)
Eosinophils Relative: 1 %
HCT: 42 % (ref 39.0–52.0)
Hemoglobin: 14.1 g/dL (ref 13.0–17.0)
Immature Granulocytes: 1 %
Lymphocytes Relative: 26 %
Lymphs Abs: 2.4 10*3/uL (ref 0.7–4.0)
MCH: 27.2 pg (ref 26.0–34.0)
MCHC: 33.6 g/dL (ref 30.0–36.0)
MCV: 81.1 fL (ref 80.0–100.0)
Monocytes Absolute: 0.6 10*3/uL (ref 0.1–1.0)
Monocytes Relative: 6 %
Neutro Abs: 6 10*3/uL (ref 1.7–7.7)
Neutrophils Relative %: 66 %
Platelets: 176 10*3/uL (ref 150–400)
RBC: 5.18 MIL/uL (ref 4.22–5.81)
RDW: 13.2 % (ref 11.5–15.5)
WBC: 9.2 10*3/uL (ref 4.0–10.5)
nRBC: 0 % (ref 0.0–0.2)

## 2020-12-02 LAB — D-DIMER, QUANTITATIVE: D-Dimer, Quant: 0.45 ug/mL-FEU (ref 0.00–0.50)

## 2020-12-02 LAB — I-STAT CHEM 8, ED
BUN: 17 mg/dL (ref 6–20)
Calcium, Ion: 1.13 mmol/L — ABNORMAL LOW (ref 1.15–1.40)
Chloride: 108 mmol/L (ref 98–111)
Creatinine, Ser: 0.8 mg/dL (ref 0.61–1.24)
Glucose, Bld: 131 mg/dL — ABNORMAL HIGH (ref 70–99)
HCT: 41 % (ref 39.0–52.0)
Hemoglobin: 13.9 g/dL (ref 13.0–17.0)
Potassium: 3.8 mmol/L (ref 3.5–5.1)
Sodium: 141 mmol/L (ref 135–145)
TCO2: 21 mmol/L — ABNORMAL LOW (ref 22–32)

## 2020-12-02 LAB — TROPONIN I (HIGH SENSITIVITY)
Troponin I (High Sensitivity): 92 ng/L — ABNORMAL HIGH (ref <18)
Troponin I (High Sensitivity): 243 ng/L (ref <18)
Troponin I (High Sensitivity): 337 ng/L (ref <18)

## 2020-12-02 LAB — CBG MONITORING, ED
Glucose-Capillary: 156 mg/dL — ABNORMAL HIGH (ref 70–99)
Glucose-Capillary: 139 mg/dL — ABNORMAL HIGH (ref 70–99)

## 2020-12-02 LAB — HEPARIN LEVEL (UNFRACTIONATED)
Heparin Unfractionated: <0.1 IU/mL — ABNORMAL LOW (ref 0.30–0.70)
Heparin Unfractionated: <0.1 IU/mL — ABNORMAL LOW (ref 0.30–0.70)

## 2020-12-02 LAB — RESP PANEL BY RT-PCR (FLU A&B, COVID) ARPGX2
Influenza A by PCR: NEGATIVE
Influenza B by PCR: NEGATIVE
SARS Coronavirus 2 by RT PCR: NEGATIVE

## 2020-12-02 LAB — HEMOGLOBIN A1C
Hgb A1c MFr Bld: 6.2 % — ABNORMAL HIGH (ref 4.8–5.6)
Mean Plasma Glucose: 131.24 mg/dL

## 2020-12-02 LAB — GLUCOSE, CAPILLARY
Glucose-Capillary: 110 mg/dL — ABNORMAL HIGH (ref 70–99)
Glucose-Capillary: 152 mg/dL — ABNORMAL HIGH (ref 70–99)

## 2020-12-02 LAB — MAGNESIUM: Magnesium: 2 mg/dL (ref 1.7–2.4)

## 2020-12-02 LAB — HIV ANTIBODY (ROUTINE TESTING W REFLEX): HIV Screen 4th Generation wRfx: NONREACTIVE

## 2020-12-02 LAB — TSH: TSH: 3.457 u[IU]/mL (ref 0.350–4.500)

## 2020-12-02 MED ORDER — DILTIAZEM HCL-DEXTROSE 125-5 MG/125ML-% IV SOLN (PREMIX)
5.0000 mg/h | INTRAVENOUS | Status: DC
  Administered 2020-12-02: 15 mg/h via INTRAVENOUS
  Administered 2020-12-02: 10 mg/h via INTRAVENOUS
  Administered 2020-12-02: 15 mg/h via INTRAVENOUS
  Filled 2020-12-02 (×4): qty 125

## 2020-12-02 MED ORDER — HEPARIN (PORCINE) 25000 UT/250ML-% IV SOLN
2450.0000 [IU]/h | INTRAVENOUS | Status: AC
  Administered 2020-12-02: 1600 [IU]/h via INTRAVENOUS
  Administered 2020-12-03: 2300 [IU]/h via INTRAVENOUS
  Filled 2020-12-02 (×3): qty 250

## 2020-12-02 MED ORDER — PANTOPRAZOLE SODIUM 40 MG PO TBEC
40.0000 mg | DELAYED_RELEASE_TABLET | Freq: Every day | ORAL | Status: DC
  Administered 2020-12-02 – 2020-12-03 (×2): 40 mg via ORAL
  Filled 2020-12-02 (×2): qty 1

## 2020-12-02 MED ORDER — IBUPROFEN 200 MG PO TABS
400.0000 mg | ORAL_TABLET | Freq: Four times a day (QID) | ORAL | Status: DC | PRN
  Administered 2020-12-02 – 2020-12-03 (×3): 400 mg via ORAL
  Filled 2020-12-02: qty 1
  Filled 2020-12-02 (×2): qty 2

## 2020-12-02 MED ORDER — ACETAMINOPHEN 325 MG PO TABS
650.0000 mg | ORAL_TABLET | Freq: Four times a day (QID) | ORAL | Status: DC | PRN
  Administered 2020-12-02 – 2020-12-04 (×3): 650 mg via ORAL
  Filled 2020-12-02 (×3): qty 2

## 2020-12-02 MED ORDER — HEPARIN BOLUS VIA INFUSION
6000.0000 [IU] | Freq: Once | INTRAVENOUS | Status: AC
  Administered 2020-12-02: 6000 [IU] via INTRAVENOUS
  Filled 2020-12-02: qty 6000

## 2020-12-02 MED ORDER — DILTIAZEM LOAD VIA INFUSION
15.0000 mg | Freq: Once | INTRAVENOUS | Status: AC
  Administered 2020-12-02: 15 mg via INTRAVENOUS
  Filled 2020-12-02: qty 15

## 2020-12-02 MED ORDER — INSULIN ASPART 100 UNIT/ML IJ SOLN: 0.0000 [IU] | Freq: Every day | INTRAMUSCULAR | Status: DC

## 2020-12-02 MED ORDER — ONDANSETRON HCL 4 MG/2ML IJ SOLN
4.0000 mg | Freq: Four times a day (QID) | INTRAMUSCULAR | Status: DC | PRN
  Administered 2020-12-02 – 2020-12-04 (×2): 4 mg via INTRAVENOUS
  Filled 2020-12-02 (×2): qty 2

## 2020-12-02 MED ORDER — METOPROLOL TARTRATE 5 MG/5ML IV SOLN: 5.0000 mg | Freq: Four times a day (QID) | INTRAVENOUS | Status: DC | PRN

## 2020-12-02 MED ORDER — INSULIN ASPART 100 UNIT/ML IJ SOLN
0.0000 [IU] | Freq: Three times a day (TID) | INTRAMUSCULAR | Status: DC
  Administered 2020-12-02 – 2020-12-03 (×4): 1 [IU] via SUBCUTANEOUS

## 2020-12-02 MED ORDER — TRAMADOL HCL 50 MG PO TABS
100.0000 mg | ORAL_TABLET | Freq: Four times a day (QID) | ORAL | Status: DC | PRN
  Administered 2020-12-02 – 2020-12-04 (×3): 100 mg via ORAL
  Filled 2020-12-02 (×3): qty 2

## 2020-12-02 MED ORDER — HEPARIN BOLUS VIA INFUSION
3500.0000 [IU] | Freq: Once | INTRAVENOUS | Status: AC
  Administered 2020-12-02: 3500 [IU] via INTRAVENOUS
  Filled 2020-12-02: qty 3500

## 2020-12-02 MED ORDER — HEPARIN BOLUS VIA INFUSION
3000.0000 [IU] | Freq: Once | INTRAVENOUS | Status: AC
  Administered 2020-12-02: 3000 [IU] via INTRAVENOUS
  Filled 2020-12-02: qty 3000

## 2020-12-02 NOTE — ED Notes (Signed)
VRBO 20mg  bolus Cardizem, infusion to 12.5mg /hr

## 2020-12-02 NOTE — ED Notes (Signed)
VRBO 20mg  bolus Cardizem, infusion to 15mg /hr

## 2020-12-02 NOTE — Consult Note (Signed)
Cardiology Consultation:   Patient ID: Julian Washington MRN: [id number]; DOB: May 05, 1980  Admit date: 12/02/2020 Date of Consult: 12/02/2020  PCP:  Colon Branch, MD   [facility] Providers Cardiologist:  None        Patient Profile:   Julian Washington is a 40 y.o. male with a hx of palpitations, NIDDM, hypertension, OSA on CPAP, morbid obesity, chronic anemia (abdominal lymphadenopathy/splenomegaly) followed by hematology who is being seen 12/02/2020 for the evaluation of atrial fibrillation with rapid ventricular rate at the request of Dr. Marlowe Sax.  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  He was evaluated by Dr. Burt Knack and Leanor Kail, PA-C in 2018 for palpitations.  Echocardiogram demonstrated normal EF, trivial MR and normal LA.  Holter monitor showed frequent PACs, rare PVCs. His palpitations have gotten progressively worse over the past 5 mos.  He takes Metoprolol tartrate 100 mg twice daily.    Other PMH includes venous insufficiency/lymphedema, chronic venous stasis dermatitis, anxiety/depression, ADHD, pituitary adenoma s/p resection in 2014, hypogonadotropic hypogonadism, GERD, psoriasis, pulmonary nodules.    He presented to the ED with syncope and palpitations.  Syncopal episode occurred shortly after getting up to walk to the bathroom.  HR was 220 when EMS arrived.  [facility]KG [facility] at [facility] was personally reviewed and demonstrated AFib, HR 172, normal axis, QTc 471 ms, 1 mm inferolateral ST depression.  CXR was unremarkable.  Head CT was unremarkable.  Labs: K+ 3.9, SCr 0.91, ALT 20, Hgb 14.1, TSH 3.457, DDimer 0.45.  hs-Trop 92>>243. He has been placed on Diltiazem gtt and IV Heparin.  HR is improved at 105.  Cardiology is asked to evaluate.  He had symptoms of near syncope prior to passing out.  He has not had any chest pain or chest discomfort.  He has not had significant shortness of breath.  He sleeps with a  CPAP.  He has not had orthopnea.  He has chronic leg edema.   Past Medical History:  Diagnosis Date   ADHD    Anemia    Anxiety and depression    h/o suicidality w/ citalopram   Diabetes mellitus without complication ([place])    Difficult intubation    ETD (eustachian tube dysfunction)    s/p ENT, declined ear tuves before    GERD (gastroesophageal reflux disease)    History of chicken pox    Titered on 08/16/2005   Hypogonadism male    Dr Cruzita Lederer, used to see urology   Infertility male    Internal hemorrhoids    s/p banding   Low testosterone    Migraine    on topamax   OSA on CPAP    Palpitations    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[place])    h/o increased prolactin, Dr Cruzita Lederer (previously @ [facility])   Prediabetes    A1C 6.2 years ago   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   Vitamin B 12 deficiency    h/o   Vitamin D deficiency     Past Surgical History:  Procedure Laterality Date   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-04-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, ACTH   TONSILLECTOMY AND ADENOIDECTOMY       Home Medications:  Prior to Admission medications   Medication Sig Start Date End Date Taking? Authorizing Provider  ACCU-CHEK FASTCLIX LANCETS MISC 1 Package by Does not apply route 2 (two) times daily. 04/23/17  Yes Alene Mires,  Sahar M, PA-C  acetaminophen (TYLENOL) 500 MG tablet Take 500-1,000 mg by mouth every 6 (six) hours as needed for moderate pain or headache.   Yes [provider]  atomoxetine (STRATTERA) 100 MG capsule Take 100 mg by mouth daily.   Yes [provider]  fexofenadine (ALLEGRA) 180 MG tablet Take 180 mg by mouth at bedtime.   Yes [provider]  fluticasone (FLONASE) 50 MCG/ACT nasal spray Place 1 spray into both nostrils at bedtime.   Yes [provider]  furosemide (LASIX) 20 MG tablet Take 1 tablet (20 mg total) by mouth daily. Patient taking differently: Take 20 mg by mouth daily as needed for fluid. 12/14/19  Yes Paz, Jacqulyn Bath E, MD  glucose  blood (ACCU-CHEK GUIDE) test strip 1 each by Other route 2 (two) times daily. Use as instructed 04/23/17  Yes Alene Mires, Sahar M, PA-C  ibuprofen (ADVIL) 200 MG tablet Take 600 mg by mouth every 6 (six) hours as needed for headache or moderate pain.   Yes [provider]  Magnesium Oxide (MAG-OXIDE PO) Take 400 mg by mouth at bedtime.   Yes [provider]  metFORMIN (GLUCOPHAGE-XR) 500 MG 24 hr tablet Take 500 mg by mouth 2 (two) times daily with a meal. 11/09/13  Yes [provider]  methylphenidate 18 MG PO CR tablet Take 18 mg by mouth daily.   Yes [provider]  metoprolol tartrate (LOPRESSOR) 100 MG tablet Take 1 tablet (100 mg total) by mouth 2 (two) times daily. 07/28/20  Yes Paz, Alda Berthold, MD  pantoprazole (PROTONIX) 40 MG tablet Take 1 tablet (40 mg total) by mouth daily. Patient taking differently: Take 40 mg by mouth at bedtime. 05/08/20  Yes Paz, Jose E, MD  SKYRIZI, 150 MG DOSE, 75 MG/0.83ML PSKT Inject 150 mg into the skin See admin instructions. Every 3 months 04/09/19  Yes [provider]  albuterol (VENTOLIN HFA) 108 (90 Base) MCG/ACT inhaler Inhale 2 puffs into the lungs every 4 (four) hours as needed for wheezing or shortness of breath. Patient not taking: No sig reported 09/26/19   Hall-Potvin, [place], PA-C  clotrimazole-betamethasone (LOTRISONE) cream Apply 1 application topically 2 (two) times daily. Patient not taking: No sig reported 03/15/20   Edrick Kins, DPM    Inpatient Medications: Scheduled Meds:  insulin aspart  0-5 Units Subcutaneous QHS   insulin aspart  0-9 Units Subcutaneous TID WC   pantoprazole  40 mg Oral QHS   Continuous Infusions:  diltiazem (CARDIZEM) infusion 20 mg/hr (12/02/20 0837)   heparin 1,600 Units/hr (12/02/20 0555)   PRN Meds: acetaminophen, ibuprofen  Allergies:    Allergies  Allergen Reactions   Levaquin [Levofloxacin In D5w] Swelling   Celexa [Citalopram] Other (See Comments)    Mental status  changes    Dextrans Other (See Comments)    Makes the pt. Drowsy.    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  Tobacco Use   Smoking status: Never   Smokeless tobacco:  Never  Vaping Use   Vaping Use: Never used  Substance and Sexual Activity   Alcohol use: Not Currently    Comment: none x 10 yrs   Drug use: No   Sexual activity: Not on file  Other Topics Concern   Not on file  Social History Narrative   Lives w/ wife   Social Determinants of Health   Financial Resource Strain: Not on file  Food Insecurity: Not on file  Transportation Needs: Not on file  Physical Activity: Not on file  Stress: Not on file  Social Connections: Not on file  Intimate Partner Violence: Not on file    Family History:    Family History  Problem Relation Age of Onset   Obesity Mother    Depression Father    Anxiety disorder Father    Bipolar disorder Father    Drug abuse Father    Cancer Maternal Grandfather        GBM    Diabetes Paternal Grandmother    Hypertension Paternal Grandmother    Diabetes Paternal Grandfather    Cancer Paternal Grandfather        prostate cancer   Prostate cancer Other        GF   CAD Other        PGF   Colon cancer Neg Hx      ROS:  Please see the history of present illness.  He has not had any recent fevers, chills, cough, melena, hematochezia, hematuria, hemoptysis.  He has noted some muscle cramping.  He has a history of migraines.  He currently has a significant headache.  He recently developed tinea pedis. All other ROS reviewed  and negative.     Physical Exam/Data:   Vitals:   12/02/20 0630 12/02/20 0645 12/02/20 0715 12/02/20 0730  BP: 134/81 (!) 137/95 (!) 146/91 120/82  Pulse: 84 96 (!) 111 (!) 105  Resp:  19 16 13   Temp:      TempSrc:      SpO2: 96% 97% 98% 98%    Intake/Output Summary (Last 24 hours) at 12/02/2020 0845 Last data filed at 12/02/2020 0203 Gross per 24 hour  Intake --  Output 2401 ml  Net -2401 ml   Last 3 Weights 07/28/2020 07/11/2020 04/27/2020  Weight (lbs) 383 lb 380 lb 395 lb 2 oz  Weight (kg) 173.728 kg 172.367 kg 179.228 kg     There is no height or weight on file to calculate BMI.  General:  Well nourished, well developed, in no acute distress HEENT: normal Neck: no JVD Vascular: No carotid bruits Cardiac: Rapid irregularly irregular rhythm, no obvious murmur Lungs:  clear to auscultation bilaterally, no wheezing, rhonchi or rales  Abd: soft -patient does note some discomfort in his chest when I press on his abdomen Ext: 2-3+ bilateral LE edema with chronic stasis changes Musculoskeletal:  No deformities Skin: warm and dry  Neuro:  CNs 2-12 intact, no focal abnormalities noted Psych:  Normal affect   EKG:  The EKG was personally reviewed and demonstrates:  see HPI.  Telemetry:  Telemetry was personally reviewed and demonstrates: Atrial fibrillation, HR 98-120  Relevant CV Studies: Echocardiogram 03/20/16 - Left ventricle: The cavity size was normal. Wall thickness was    increased in a pattern of mild LVH. Systolic function was normal.    The estimated ejection fraction was in the range of 55% to 60%.    Wall motion was normal; there were no regional wall motion  abnormalities. Left ventricular diastolic function parameters    were normal.  - Mitral valve: Mildly thickened leaflets . There was trivial    regurgitation.  - Left atrium: The atrium was normal in size.  - Inferior vena cava: The vessel was normal in size. The    respirophasic diameter changes were in  the normal range (= 50%),    consistent with normal central venous pressure.   48 HR Holter 1. The basic rhythm is sinus 2. Frequent PACs without sustained arrhythmia 3. Rare PVC's and one ventricular couplet  Laboratory Data:  High Sensitivity Troponin:   Recent Labs  Lab 12/02/20 0100 12/02/20 0328  TROPONINIHS 92* 243*     Chemistry Recent Labs  Lab 12/02/20 0100 12/02/20 0108 12/02/20 0416  NA 137 141  --   K 3.9 3.8  --   CL 108 108  --   CO2 20*  --   --   GLUCOSE 132* 131*  --   BUN 16 17  --   CREATININE 0.91 0.80  --   CALCIUM 8.9  --   --   MG  --   --  2.0  GFRNONAA >60  --   --   ANIONGAP 9  --   --     Recent Labs  Lab 12/02/20 0100  PROT 6.4*  ALBUMIN 3.8  AST 19  ALT 20  ALKPHOS 71  BILITOT 0.8   Lipids No results for input(s): CHOL, TRIG, HDL, LABVLDL, LDLCALC, CHOLHDL in the last 168 hours.  Hematology Recent Labs  Lab 12/02/20 0100 12/02/20 0108  WBC 9.2  --   RBC 5.18  --   HGB 14.1 13.9  HCT 42.0 41.0  MCV 81.1  --   MCH 27.2  --   MCHC 33.6  --   RDW 13.2  --   PLT 176  --    Thyroid  Recent Labs  Lab 12/02/20 0416  TSH 3.457    BNPNo results for input(s): BNP, PROBNP in the last 168 hours.  DDimer  Recent Labs  Lab 12/02/20 0416  DDIMER 0.45     Radiology/Studies:  CT HEAD WO CONTRAST (5MM)  Result Date: 12/02/2020 CLINICAL DATA:  Syncope. EXAM: CT HEAD WITHOUT CONTRAST TECHNIQUE: Contiguous axial images were obtained from the base of the skull through the vertex without intravenous contrast. COMPARISON:  Head CT dated 11/17/2009. FINDINGS: Brain: No evidence of acute infarction, hemorrhage, hydrocephalus, extra-axial collection or mass lesion/mass effect. Vascular: No hyperdense vessel or unexpected calcification. Skull: Normal. Negative for fracture or focal lesion. Sinuses/Orbits: No acute finding. Other: None IMPRESSION: Normal noncontrast CT of the brain. Electronically Signed   By: Anner Crete M.D.   On:  12/02/2020 02:45   DG Chest Portable 1 View  Result Date: 12/02/2020 CLINICAL DATA:  Chest pain. EXAM: PORTABLE CHEST 1 VIEW COMPARISON:  Chest radiograph dated 07/11/2020. FINDINGS: No focal consolidation, pleural effusion, or pneumothorax. Mild cardiomegaly. No acute osseous pathology. IMPRESSION: 1. No acute cardiopulmonary process. 2. Mild cardiomegaly. Electronically Signed   By: Anner Crete M.D.   On: 12/02/2020 01:22     Assessment and Plan:   1. Atrial fibrillation with RVR CHA2DS2-VASc Score = 2 [CHF History: 0, HTN History: 1, Diabetes History: 1, Stroke History: 0, Vascular Disease History: 0, Age Score: 0, Gender Score: 0].  Therefore, the patient's annual risk of stroke is 2.2 %.  He will require long-term anticoagulation given his stroke risk.  Continue IV heparin for now.  Eventually, transition to [place].  Increase diltiazem to 20 mg/hr for better rate control.  2D echocardiogram pending.  If rate is difficult to control or he remains in AFib on Monday, we can consider TEE guided cardioversion.  2. Elevated Troponin Elevated troponin is likely related to demand ischemia.  However, there was a significant increase from the initial set to the follow-up.  Continue to trend high-sensitivity troponin.  If his elevation is more consistent with ACS, he will need an ischemic evaluation.  2D echocardiogram pending.  If we can restore NSR, he will likely need a Coronary CTA.  If CAD can be ruled out, he would be a good candidate for Flecainide.  If he remains in AFib, will likely have to pursue a Nuclear Stress Test.    3. Hypertension  Blood pressure uncontrolled.  Increase diltiazem further as noted above.  4. Anemia Previously followed by Oncology.  Hgb is typically ~ 12.  Here his Hgb is 14.1/13.9.  He should be able to tolerate anticoagulation.  5. Migraine headache He had to sleep without CPAP last night and all of the noise in the emergency room has induced a headache.  He can  have Tylenol now.  Otherwise, per primary service.  6.  ADHD We will likely need to find alternative management given atrial fibrillation with rapid rate   Risk Assessment/Risk Scores:     TIMI Risk Score for Unstable Angina or Non-ST Elevation MI:   The patient's TIMI risk score is 2, which indicates a 8% risk of all cause mortality, new or recurrent myocardial infarction or need for urgent revascularization in the next 14 days.    CHA2DS2-VASc Score = 2   This indicates a 2.2% annual risk of stroke. The patient's score is based upon: CHF History: 0 HTN History: 1 Diabetes History: 1 Stroke History: 0 Vascular Disease History: 0 Age Score: 0 Gender Score: 0        For questions or updates, please contact [place] Please consult [email or web address] for contact info under    Signed, Richardson Dopp, PA-C  12/02/2020 8:45 AM

## 2020-12-02 NOTE — Progress Notes (Signed)
[facility] PROGRESS NOTE    Progress Note  Julian Washington  UKG:[id number] DOB: 30-Jul-1980 DOA: 12/02/2020 PCP: Colon Branch, MD     Brief Narrative:   Julian Washington is an 40 y.o. male past medical history significant for palpitation, non-insulin-dependent diabetes mellitus, essential hypertension chronic lymphedema, chronic venous stasis dermatitis morbid obesity, pituitary adenoma status post surgical removal in 2014 hypogonadism, with chronic anemia abdominal pain lymphadenopathy splenomegaly followed by hematology, pulmonary nodules presents to the ED for syncope and palpitations found to be in A. fib with RVR    Assessment/Plan:   Atrial fibrillation with rapid ventricular response ([place]) Likely precipitated by ADHD medication, is also to note that he had his flu vaccine 3 days prior to admission, despite taking metoprolol. Mag level TSH are within normal. Chest Vascor greater than 2. Echocardiogram is pending started on IV diltiazem. Continue IV heparin.  Elevated troponins: Likely demand ischemia in the setting of RVR. ACS less likely 2D echo is pending, will continue to trend troponins.  Syncope Likely due to RVR 2D echo is pending.  Non-insulin-dependent diabetes mellitus type 2: With an A1c of 6.2 continue sliding scale insulin.  Essential hypertension: Stable continue IV diltiazem.    DVT prophylaxis: heparin Family Communication:none Status is: Inpatient  Not inpatient appropriate, will call [facility] team and downgrade to OBS.     Code Status:     Code Status Orders  (From admission, onward)           Start     Ordered   12/02/20 0403  Full code  Continuous        12/02/20 0406           Code Status History     This patient has a current code status but no historical code status.         IV Access:   Peripheral IV   Procedures and diagnostic studies:   CT HEAD WO CONTRAST (5MM)  Result Date: 12/02/2020 CLINICAL DATA:   Syncope. EXAM: CT HEAD WITHOUT CONTRAST TECHNIQUE: Contiguous axial images were obtained from the base of the skull through the vertex without intravenous contrast. COMPARISON:  Head CT dated 11/17/2009. FINDINGS: Brain: No evidence of acute infarction, hemorrhage, hydrocephalus, extra-axial collection or mass lesion/mass effect. Vascular: No hyperdense vessel or unexpected calcification. Skull: Normal. Negative for fracture or focal lesion. Sinuses/Orbits: No acute finding. Other: None IMPRESSION: Normal noncontrast CT of the brain. Electronically Signed   By: Anner Crete M.D.   On: 12/02/2020 02:45   DG Chest Portable 1 View  Result Date: 12/02/2020 CLINICAL DATA:  Chest pain. EXAM: PORTABLE CHEST 1 VIEW COMPARISON:  Chest radiograph dated 07/11/2020. FINDINGS: No focal consolidation, pleural effusion, or pneumothorax. Mild cardiomegaly. No acute osseous pathology. IMPRESSION: 1. No acute cardiopulmonary process. 2. Mild cardiomegaly. Electronically Signed   By: Anner Crete M.D.   On: 12/02/2020 01:22     Medical Consultants:   None.   Subjective:    Julian Washington relates he does not have any more palpitations denies any chest pain or shortness of breath  Objective:    Vitals:   12/02/20 0600 12/02/20 0615 12/02/20 0630 12/02/20 0645  BP: 133/79 (!) 141/79 134/81 (!) 137/95  Pulse: 91 98 84 96  Resp: 16   19  Temp:      TempSrc:      SpO2: 98% 95% 96% 97%   SpO2: 97 %   Intake/Output Summary (Last 24 hours) at 12/02/2020 0707  Last data filed at 12/02/2020 0203 Gross per 24 hour  Intake --  Output 2401 ml  Net -2401 ml   There were no vitals filed for this visit.  Exam: General exam: In no acute distress. Respiratory system: Good air movement and clear to auscultation. Cardiovascular system: S1 & S2 heard, RRR. No JVD. Gastrointestinal system: Abdomen is nondistended, soft and nontender.  Extremities: No pedal edema. Skin: No rashes, lesions or ulcers Data  Reviewed:    Labs: Basic Metabolic Panel: Recent Labs  Lab 12/02/20 0100 12/02/20 0108 12/02/20 0416  NA 137 141  --   K 3.9 3.8  --   CL 108 108  --   CO2 20*  --   --   GLUCOSE 132* 131*  --   BUN 16 17  --   CREATININE 0.91 0.80  --   CALCIUM 8.9  --   --   MG  --   --  2.0   GFR CrCl cannot be calculated (Unknown ideal weight.). Liver Function Tests: Recent Labs  Lab 12/02/20 0100  AST 19  ALT 20  ALKPHOS 71  BILITOT 0.8  PROT 6.4*  ALBUMIN 3.8   No results for input(s): LIPASE, AMYLASE in the last 168 hours. No results for input(s): AMMONIA in the last 168 hours. Coagulation profile No results for input(s): INR, PROTIME in the last 168 hours. COVID-19 Labs  Recent Labs    12/02/20 0416  DDIMER 0.45    Lab Results  Component Value Date   SARSCOV2NAA NEGATIVE 12/02/2020    CBC: Recent Labs  Lab 12/02/20 0100 12/02/20 0108  WBC 9.2  --   NEUTROABS 6.0  --   HGB 14.1 13.9  HCT 42.0 41.0  MCV 81.1  --   PLT 176  --    Cardiac Enzymes: No results for input(s): CKTOTAL, CKMB, CKMBINDEX, TROPONINI in the last 168 hours. BNP (last 3 results) No results for input(s): PROBNP in the last 8760 hours. CBG: No results for input(s): GLUCAP in the last 168 hours. D-Dimer: Recent Labs    12/02/20 0416  DDIMER 0.45   Hgb A1c: Recent Labs    12/02/20 0416  HGBA1C 6.2*   Lipid Profile: No results for input(s): CHOL, HDL, LDLCALC, TRIG, CHOLHDL, LDLDIRECT in the last 72 hours. Thyroid function studies: Recent Labs    12/02/20 0416  TSH 3.457   Anemia work up: No results for input(s): VITAMINB12, FOLATE, FERRITIN, TIBC, IRON, RETICCTPCT in the last 72 hours. Sepsis Labs: Recent Labs  Lab 12/02/20 0100  WBC 9.2   Microbiology Recent Results (from the past 240 hour(s))  Resp Panel by RT-PCR (Flu A&B, Covid) Nasopharyngeal Swab     Status: None   Collection Time: 12/02/20  1:00 AM   Specimen: Nasopharyngeal Swab; Nasopharyngeal(NP) swabs  in vial transport medium  Result Value Ref Range Status   SARS Coronavirus 2 by RT PCR NEGATIVE NEGATIVE Final    Comment: (NOTE) SARS-CoV-2 target nucleic acids are NOT DETECTED.  The SARS-CoV-2 RNA is generally detectable in upper respiratory specimens during the acute phase of infection. The lowest concentration of SARS-CoV-2 viral copies this assay can detect is 138 copies/mL. A negative result does not preclude SARS-Cov-2 infection and should not be used as the sole basis for treatment or other patient management decisions. A negative result may occur with  improper specimen collection/handling, submission of specimen other than nasopharyngeal swab, presence of viral mutation(s) within the areas targeted by this assay, and inadequate number  of viral copies(<138 copies/mL). A negative result must be combined with clinical observations, patient history, and epidemiological information. The expected result is Negative.  Fact Sheet for Patients:  EntrepreneurPulse.com.au  Fact Sheet for Healthcare Providers:  IncredibleEmployment.be  This test is no t yet approved or cleared by the [place] FDA and  has been authorized for detection and/or diagnosis of SARS-CoV-2 by FDA under an Emergency Use Authorization (EUA). This EUA will remain  in effect (meaning this test can be used) for the duration of the COVID-19 declaration under Section 564(b)(1) of the Act, 21 U.S.C.section 360bbb-3(b)(1), unless the authorization is terminated  or revoked sooner.       Influenza A by PCR NEGATIVE NEGATIVE Final   Influenza B by PCR NEGATIVE NEGATIVE Final    Comment: (NOTE) The Xpert Xpress SARS-CoV-2/FLU/RSV plus assay is intended as an aid in the diagnosis of influenza from Nasopharyngeal swab specimens and should not be used as a sole basis for treatment. Nasal washings and aspirates are unacceptable for Xpert Xpress  SARS-CoV-2/FLU/RSV testing.  Fact Sheet for Patients: EntrepreneurPulse.com.au  Fact Sheet for Healthcare Providers: IncredibleEmployment.be  This test is not yet approved or cleared by the [place] FDA and has been authorized for detection and/or diagnosis of SARS-CoV-2 by FDA under an Emergency Use Authorization (EUA). This EUA will remain in effect (meaning this test can be used) for the duration of the COVID-19 declaration under Section 564(b)(1) of the Act, 21 U.S.C. section 360bbb-3(b)(1), unless the authorization is terminated or revoked.  Performed at [facility], [place] [address], [place], [place] [address]      Medications:    insulin aspart  0-5 Units Subcutaneous QHS   insulin aspart  0-9 Units Subcutaneous TID WC   pantoprazole  40 mg Oral QHS   Continuous Infusions:  diltiazem (CARDIZEM) infusion 15 mg/hr (12/02/20 0552)   heparin 1,600 Units/hr (12/02/20 0555)      LOS: 0 days   Charlynne Cousins  [facility]  12/02/2020, 7:07 AM

## 2020-12-02 NOTE — Progress Notes (Signed)
ANTICOAGULATION CONSULT NOTE - Initial Consult  Pharmacy Consult for Heparin  Indication: atrial fibrillation  Allergies  Allergen Reactions   Levaquin [Levofloxacin In D5w] Swelling   Celexa [Citalopram] Other (See Comments)    Mental status changes    Dextrans Other (See Comments)    Makes the pt. Drowsy.    Hydrocodone-Acetaminophen     REACTION: itching   Latex    Oxycodone Itching   Shiitake Mushroom Rash    Vital Signs: BP: 125/88 (10/22 1200) Pulse Rate: 47 (10/22 1200)  Labs: Recent Labs    12/02/20 0100 12/02/20 0108 12/02/20 0328 12/02/20 0703 12/02/20 1417  HGB 14.1 13.9  --   --   --   HCT 42.0 41.0  --   --   --   PLT 176  --   --   --   --   HEPARINUNFRC  --   --   --   --  <0.10*  CREATININE 0.91 0.80  --   --   --   TROPONINIHS 92*  --  243* 337*  --      CrCl cannot be calculated (Unknown ideal weight.).   Medical History: Past Medical History:  Diagnosis Date   ADHD    Anemia    Anxiety and depression    h/o suicidality w/ citalopram   Diabetes mellitus without complication ([place])    Difficult intubation    ETD (eustachian tube dysfunction)    s/p ENT, declined ear tuves before    GERD (gastroesophageal reflux disease)    History of chicken pox    Titered on 08/16/2005   Hypogonadism male    Dr Cruzita Lederer, used to see urology   Infertility male    Internal hemorrhoids    s/p banding   Low testosterone    Migraine    on topamax   OSA on CPAP    Palpitations    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[place])    h/o increased prolactin, Dr Cruzita Lederer (previously @ [facility])   Prediabetes    A1C 6.2 years ago   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 CBC/renal function good. No anti-coagulation PTA.   Heparin started at 1600 units/hr Heparin level at 1417 resulting at <0.1. No issues noted.  Goal of Therapy:  Heparin level  0.3-0.7 units/ml Monitor platelets by anticoagulation protocol: Yes   Plan:  Heparin 3000 units BOLUS Increase heparin drip to 1900 units/hr Heparin level in 8 hours Daily CBC/Heparin level Monitor for bleeding  Lorelei Pont, PharmD, BCPS 12/02/2020 2:55 PM ED Clinical Pharmacist -  [phone]

## 2020-12-02 NOTE — Progress Notes (Signed)
ANTICOAGULATION CONSULT NOTE - Initial Consult  Pharmacy Consult for Heparin  Indication: atrial fibrillation  Allergies  Allergen Reactions   Levaquin [Levofloxacin In D5w] Swelling   Celexa [Citalopram] Other (See Comments)    Mental status changes    Dextrans Other (See Comments)    Makes the pt. Drowsy.    Hydrocodone-Acetaminophen     REACTION: itching   Latex    Oxycodone Itching   Shiitake Mushroom Rash    Vital Signs: Temp: 98.2 F (36.8 C) (10/22 0102) Temp Source: Oral (10/22 0102) BP: 170/148 (10/22 0215) Pulse Rate: 68 (10/22 0215)  Labs: Recent Labs    12/02/20 0100 12/02/20 0108  HGB 14.1 13.9  HCT 42.0 41.0  PLT 176  --   CREATININE 0.91 0.80  TROPONINIHS 92*  --     CrCl cannot be calculated (Unknown ideal weight.).   Medical History: Past Medical History:  Diagnosis Date   ADHD    Anemia    Anxiety and depression    h/o suicidality w/ citalopram   Diabetes mellitus without complication ([place])    Difficult intubation    ETD (eustachian tube dysfunction)    s/p ENT, declined ear tuves before    GERD (gastroesophageal reflux disease)    History of chicken pox    Titered on 08/16/2005   Hypogonadism male    Dr Cruzita Lederer, used to see urology   Infertility male    Internal hemorrhoids    s/p banding   Low testosterone    Migraine    on topamax   OSA on CPAP    Palpitations    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[place])    h/o increased prolactin, Dr Cruzita Lederer (previously @ [facility])   Prediabetes    A1C 6.2 years ago   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 Starting heparin. CBC/renal function good. No anti-coagulation PTA.   Goal of Therapy:  Heparin level 0.3-0.7 units/ml Monitor platelets by anticoagulation protocol: Yes   Plan:  Heparin 6000 units BOLUS Start heparin drip at 1600 units/hr Heparin level in 8  hours Daily CBC/Heparin level Monitor for bleeding  Narda Bonds, PharmD, BCPS Clinical Pharmacist Phone: [phone]

## 2020-12-02 NOTE — H&P (Signed)
History and Physical    Julian Washington DJT:[id number] DOB: 22-Sep-1980 DOA: 12/02/2020  PCP: Colon Branch, MD Patient coming from: Home  Chief Complaint: Syncope, palpitations  HPI: Julian Washington is a 39 y.o. male with medical history significant of palpitations, noninsulin-dependent type 2 diabetes, hypertension, chronic venous insufficiency/lymphedema, chronic venous stasis dermatitis, anxiety, depression, history of suicidality with citalopram, ADHD, morbid obesity, pituitary adenoma status post removal in 2014, hypogonadotropic hypogonadism, migraines, OSA on CPAP, GERD, internal hemorrhoids, chronic anemia/ abdominal lymphadenopathy/ splenomegaly followed by hematology, psoriasis, pulmonary nodules.  He presents to the ED for evaluation of syncope and palpitations.  Found to be in A. fib with RVR (rate in the 170s).  Blood pressure 164/94 on arrival.  Not febrile or hypoxic.  Labs showing WBC 9.2, hemoglobin 14.1, platelet count 176k.  Sodium 137, potassium 3.9, chloride 108, bicarb 20, anion gap 9, BUN 16, creatinine 0.9, glucose 132.  High-sensitivity troponin 92.  COVID and influenza PCR negative.  Chest x-ray showing mild cardiomegaly and no acute process.  CT head negative for acute finding. Patient was given Cardizem bolus and started on continuous IV infusion.  Started on IV heparin.  Patient states tonight he woke up to use the bathroom and as he was walking he felt lightheaded and then passed out.  When he woke up his heart was fluttering.  When EMS arrived his heart rate was 220.  Denies chest pain or shortness of breath.  Does report longstanding history of palpitations for several years but the problem has gotten progressively worse in the past 5 months.  He takes a stimulant drug for ADHD as other treatment options have not worked for him.Marland Kitchen  Despite taking metoprolol 100 mg twice daily he continues to have frequent episodes of palpitations.  He thinks the palpitations usually start after  he takes metoprolol.  Denies fevers.  Denies history of blood clots.  He did receive flu shot 2 days ago after which his arm was sore but he was not feeling ill otherwise.  Review of Systems:  All systems reviewed and apart from history of presenting illness, are negative.  Past Medical History:  Diagnosis Date   ADHD    Anemia    Anxiety and depression    h/o suicidality w/ citalopram   Diabetes mellitus without complication ([place])    Difficult intubation    ETD (eustachian tube dysfunction)    s/p ENT, declined ear tuves before    GERD (gastroesophageal reflux disease)    History of chicken pox    Titered on 08/16/2005   Hypogonadism male    Dr Cruzita Lederer, used to see urology   Infertility male    Internal hemorrhoids    s/p banding   Low testosterone    Migraine    on topamax   OSA on CPAP    Palpitations    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[place])    h/o increased prolactin, Dr Cruzita Lederer (previously @ [facility])   Prediabetes    A1C 6.2 years ago   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 He has never used smokeless tobacco. He reports that he does not currently use alcohol. He reports that  he does not use drugs.  Allergies  Allergen Reactions   Levaquin [Levofloxacin In D5w] Swelling   Celexa [Citalopram] Other (See Comments)    Mental status changes    Dextrans Other (See Comments)    Makes the pt. Drowsy.    Hydrocodone-Acetaminophen     REACTION: itching   Latex    Oxycodone Itching   Shiitake Mushroom Rash    Family History  Problem Relation Age of Onset   Obesity Mother    Depression Father    Anxiety disorder Father    Bipolar disorder Father    Drug abuse Father    Cancer Maternal Grandfather        GBM    Diabetes  Paternal Grandmother    Hypertension Paternal Grandmother    Diabetes Paternal Grandfather    Cancer Paternal Grandfather        prostate cancer   Prostate cancer Other        GF   CAD Other        PGF   Colon cancer Neg Hx     Prior to Admission medications   Medication Sig Start Date End Date Taking? Authorizing Provider  ACCU-CHEK FASTCLIX LANCETS MISC 1 Package by Does not apply route 2 (two) times daily. 04/23/17  Yes Alene Mires, Sahar M, PA-C  acetaminophen (TYLENOL) 500 MG tablet Take 500-1,000 mg by mouth every 6 (six) hours as needed for moderate pain or headache.   Yes [provider]  atomoxetine (STRATTERA) 100 MG capsule Take 100 mg by mouth daily.   Yes [provider]  fexofenadine (ALLEGRA) 180 MG tablet Take 180 mg by mouth at bedtime.   Yes [provider]  fluticasone (FLONASE) 50 MCG/ACT nasal spray Place 1 spray into both nostrils at bedtime.   Yes [provider]  furosemide (LASIX) 20 MG tablet Take 1 tablet (20 mg total) by mouth daily. Patient taking differently: Take 20 mg by mouth daily as needed for fluid. 12/14/19  Yes Paz, Jacqulyn Bath E, MD  glucose blood (ACCU-CHEK GUIDE) test strip 1 each by Other route 2 (two) times daily. Use as instructed 04/23/17  Yes Alene Mires, Sahar M, PA-C  ibuprofen (ADVIL) 200 MG tablet Take 600 mg by mouth every 6 (six) hours as needed for headache or moderate pain.   Yes [provider]  Magnesium Oxide (MAG-OXIDE PO) Take 400 mg by mouth at bedtime.   Yes [provider]  metFORMIN (GLUCOPHAGE-XR) 500 MG 24 hr tablet Take 500 mg by mouth 2 (two) times daily with a meal. 11/09/13  Yes [provider]  methylphenidate 18 MG PO CR tablet Take 18 mg by mouth daily.   Yes [provider]  metoprolol tartrate (LOPRESSOR) 100 MG tablet Take 1 tablet (100 mg total) by mouth 2 (two) times daily. 07/28/20  Yes Paz, Alda Berthold, MD  pantoprazole (PROTONIX) 40 MG tablet Take 1 tablet (40 mg  total) by mouth daily. Patient taking differently: Take 40 mg by mouth at bedtime. 05/08/20  Yes Paz, Jose E, MD  SKYRIZI, 150 MG DOSE, 75 MG/0.83ML PSKT Inject 150 mg into the skin See admin instructions. Every 3 months 04/09/19  Yes [provider]  albuterol (VENTOLIN HFA) 108 (90 Base) MCG/ACT inhaler Inhale 2 puffs into the lungs every 4 (four) hours as needed for wheezing or shortness of breath. Patient not taking: No sig reported 09/26/19   Hall-Potvin, [place], PA-C  clotrimazole-betamethasone (LOTRISONE) cream Apply 1 application topically 2 (two) times daily.  Patient not taking: No sig reported 03/15/20   Edrick Kins, DPM    Physical Exam: Vitals:   12/02/20 0600 12/02/20 0615 12/02/20 0630 12/02/20 0645  BP: 133/79 (!) 141/79 134/81 (!) 137/95  Pulse: 91 98 84 96  Resp: 16   19  Temp:      TempSrc:      SpO2: 98% 95% 96% 97%    Physical Exam Constitutional:      General: He is not in acute distress. HENT:     Head: Normocephalic and atraumatic.  Eyes:     Extraocular Movements: Extraocular movements intact.     Conjunctiva/sclera: Conjunctivae normal.  Cardiovascular:     Rate and Rhythm: Tachycardia present. Rhythm irregular.     Pulses: Normal pulses.  Pulmonary:     Effort: Pulmonary effort is normal. No respiratory distress.     Breath sounds: Normal breath sounds. No wheezing or rales.  Abdominal:     General: Bowel sounds are normal. There is no distension.     Palpations: Abdomen is soft.     Tenderness: There is no abdominal tenderness.  Musculoskeletal:     Cervical back: Normal range of motion and neck supple.     Right lower leg: Edema present.     Left lower leg: Edema present.  Skin:    General: Skin is warm and dry.     Comments: Chronic venous stasis dermatitis of bilateral lower extremities  Neurological:     General: No focal deficit present.     Mental Status: He is alert and oriented to person, place, and time.     Labs on  Admission: I have personally reviewed following labs and imaging studies  CBC: Recent Labs  Lab 12/02/20 0100 12/02/20 0108  WBC 9.2  --   NEUTROABS 6.0  --   HGB 14.1 13.9  HCT 42.0 41.0  MCV 81.1  --   PLT 176  --    Basic Metabolic Panel: Recent Labs  Lab 12/02/20 0100 12/02/20 0108 12/02/20 0416  NA 137 141  --   K 3.9 3.8  --   CL 108 108  --   CO2 20*  --   --   GLUCOSE 132* 131*  --   BUN 16 17  --   CREATININE 0.91 0.80  --   CALCIUM 8.9  --   --   MG  --   --  2.0   GFR: CrCl cannot be calculated (Unknown ideal weight.). Liver Function Tests: Recent Labs  Lab 12/02/20 0100  AST 19  ALT 20  ALKPHOS 71  BILITOT 0.8  PROT 6.4*  ALBUMIN 3.8   No results for input(s): LIPASE, AMYLASE in the last 168 hours. No results for input(s): AMMONIA in the last 168 hours. Coagulation Profile: No results for input(s): INR, PROTIME in the last 168 hours. Cardiac Enzymes: No results for input(s): CKTOTAL, CKMB, CKMBINDEX, TROPONINI in the last 168 hours. BNP (last 3 results) No results for input(s): PROBNP in the last 8760 hours. HbA1C: Recent Labs    12/02/20 0416  HGBA1C 6.2*   CBG: No results for input(s): GLUCAP in the last 168 hours. Lipid Profile: No results for input(s): CHOL, HDL, LDLCALC, TRIG, CHOLHDL, LDLDIRECT in the last 72 hours. Thyroid Function Tests: Recent Labs    12/02/20 0416  TSH 3.457   Anemia Panel: No results for input(s): VITAMINB12, FOLATE, FERRITIN, TIBC, IRON, RETICCTPCT in the last 72 hours. Urine analysis:    Component  Value Date/Time   COLORURINE YELLOW 09/20/2017 0730   APPEARANCEUR CLEAR 09/20/2017 0730   LABSPEC 1.010 09/20/2017 0730   PHURINE 6.0 09/20/2017 0730   GLUCOSEU NEGATIVE 09/20/2017 0730   GLUCOSEU NEGATIVE 02/22/2015 0902   HGBUR NEGATIVE 09/20/2017 0730   BILIRUBINUR NEGATIVE 09/20/2017 0730   KETONESUR NEGATIVE 09/20/2017 0730   PROTEINUR NEGATIVE 09/20/2017 0730   UROBILINOGEN 0.2 02/22/2015 0902    NITRITE NEGATIVE 09/20/2017 0730   LEUKOCYTESUR NEGATIVE 09/20/2017 0730    Radiological Exams on Admission: CT HEAD WO CONTRAST (5MM)  Result Date: 12/02/2020 CLINICAL DATA:  Syncope. EXAM: CT HEAD WITHOUT CONTRAST TECHNIQUE: Contiguous axial images were obtained from the base of the skull through the vertex without intravenous contrast. COMPARISON:  Head CT dated 11/17/2009. FINDINGS: Brain: No evidence of acute infarction, hemorrhage, hydrocephalus, extra-axial collection or mass lesion/mass effect. Vascular: No hyperdense vessel or unexpected calcification. Skull: Normal. Negative for fracture or focal lesion. Sinuses/Orbits: No acute finding. Other: None IMPRESSION: Normal noncontrast CT of the brain. Electronically Signed   By: Anner Crete M.D.   On: 12/02/2020 02:45   DG Chest Portable 1 View  Result Date: 12/02/2020 CLINICAL DATA:  Chest pain. EXAM: PORTABLE CHEST 1 VIEW COMPARISON:  Chest radiograph dated 07/11/2020. FINDINGS: No focal consolidation, pleural effusion, or pneumothorax. Mild cardiomegaly. No acute osseous pathology. IMPRESSION: 1. No acute cardiopulmonary process. 2. Mild cardiomegaly. Electronically Signed   By: Anner Crete M.D.   On: 12/02/2020 01:22    EKG: Independently reviewed.  A. fib with RVR, diffuse ST changes.  Assessment/Plan Principal Problem:   Atrial fibrillation with rapid ventricular response (HCC) Active Problems:   OSA on CPAP   Type 2 diabetes mellitus without complication, without long-term current use of insulin (HCC)   HTN (hypertension)   Syncope   New onset A. fib with RVR Likely precipitated by stimulant drug use for ADHD, despite taking metoprolol 100 mg twice daily.  No infectious signs or symptoms.  Magnesium level normal.  TSH normal.  PE less likely as D-dimer normal.  Currently rate controlled after IV Cardizem.  CHA2DS2-VASc 2.  -Cardiac monitoring.  Continue IV Cardizem and IV heparin.  Echocardiogram ordered.  I have  sent a message to cardiology requesting consultation in the morning.  Elevated troponin Likely due to demand ischemia from A. fib with RVR.  Initial EKG showing diffuse ST changes, likely rate related as rate was in the 170s.  High-sensitivity troponin 92 >243.  ACS less likely as patient is not endorsing any chest pain. -Cardiac monitoring.  Trend troponin.  Syncope Likely due to A. fib with RVR.  PE less likely as D-dimer is normal. -Echocardiogram ordered  Non-insulin-dependent type 2 diabetes Well-controlled -A1c 6.2. -Sliding scale insulin sensitive ACHS  Hypertension Stable. -On IV Cardizem  OSA -Continue CPAP at night  GERD -Continue Protonix  DVT prophylaxis: Heparin Code Status: Full code Family Communication: Diagnostic findings and treatment plan discussed with the patient and his wife at bedside. Disposition Plan: Status is: Inpatient  Remains inpatient appropriate because: New onset A. fib with RVR  Level of care: Level of care: Progressive  The medical decision making on this patient was of high complexity and the patient is at high risk for clinical deterioration, therefore this is a level 3 visit.  Shela Leff MD [facility]  If 7PM-7AM, please contact night-coverage [email or web address]  12/02/2020, 7:11 AM

## 2020-12-02 NOTE — ED Provider Notes (Signed)
[facility] EMERGENCY DEPARTMENT Provider Note   CSN: [id number] Arrival date & time: 12/02/20  0045     History Chief Complaint  Patient presents with   Atrial Fibrillation    Julian Washington is a 40 y.o. male.  The history is provided by the patient.  Atrial Fibrillation This is a recurrent (has had palitation in the evening post metroprolol) problem. The current episode started 1 to 2 hours ago. The problem occurs constantly. The problem has not changed since onset.Pertinent negatives include no chest pain, no abdominal pain, no headaches and no shortness of breath. Nothing aggravates the symptoms. Nothing relieves the symptoms. He has tried nothing for the symptoms. The treatment provided no relief.  Patient with DM and ADHD presents following a syncopal event at home.  Patient awoke and had palpitations.  No chest pain.  No shortness of breath.  Has had palpitations post metoprolol doses     Past Medical History:  Diagnosis Date   ADHD    Anemia    Anxiety and depression    h/o suicidality w/ citalopram   Diabetes mellitus without complication ([place])    Difficult intubation    ETD (eustachian tube dysfunction)    s/p ENT, declined ear tuves before    GERD (gastroesophageal reflux disease)    History of chicken pox    Titered on 08/16/2005   Hypogonadism male    Dr Cruzita Lederer, used to see urology   Infertility male    Internal hemorrhoids    s/p banding   Low testosterone    Migraine    on topamax   OSA on CPAP    Palpitations    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[place])    h/o increased prolactin, Dr Cruzita Lederer (previously @ [facility])   Prediabetes    A1C 6.2 years ago   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   Vitamin B 12 deficiency    h/o   Vitamin D deficiency     Patient Active Problem List   Diagnosis Date Noted   Abnormal finding on lung imaging 06/26/2018   ADHD 02/28/2018   Chronic venous insufficiency of lower extremity 02/28/2018    Psoriasis 02/24/2018   Splenomegaly 09/24/2017   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   Absolute anemia 12/04/2016   Primary hypertension 12/04/2016   Type 2 diabetes mellitus without complication, without long-term current use of insulin ([place]) 09/25/2016   Morbid obesity ([place]) 09/25/2016   PCP NOTES >>> 11/15/2014   H/O: pituitary tumor 01/28/2014   Annual physical exam 07/13/2013   Anxiety and depression    Vitamin B 12 deficiency    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[place])    Migraine    OSA on CPAP    Vitamin D deficiency    Internal hemorrhoids    GERD 06/02/2009    Past Surgical History:  Procedure Laterality Date   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-04-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, ACTH   TONSILLECTOMY AND ADENOIDECTOMY         Family History  Problem Relation Age of Onset   Obesity Mother    Depression Father    Anxiety disorder Father    Bipolar disorder Father    Drug abuse Father    Cancer Maternal Grandfather        GBM    Diabetes Paternal Grandmother    Hypertension Paternal Grandmother    Diabetes Paternal Grandfather    Cancer Paternal Grandfather        prostate cancer  Prostate cancer Other        GF   CAD Other        PGF   Colon cancer Neg Hx     Social History   Tobacco Use   Smoking status: Never   Smokeless tobacco: Never  Vaping Use   Vaping Use: Never used  Substance Use Topics   Alcohol use: Not Currently    Comment: none x 10 yrs   Drug use: No    Home Medications Prior to Admission medications   Medication Sig Start Date End Date Taking? Authorizing Provider  ACCU-CHEK FASTCLIX LANCETS MISC 1 Package by Does not apply route 2 (two) times daily. 04/23/17   Waldon Merl, PA-C  albuterol (VENTOLIN HFA) 108 (90 Base) MCG/ACT inhaler Inhale 2 puffs into the lungs every 4 (four) hours as needed for wheezing or shortness of breath. Patient not taking: Reported on 07/28/2020 09/26/19   Hall-Potvin, [place], PA-C  Ascorbic Acid (VITAMIN C PO) Take  by mouth.    [provider]  Atomoxetine HCl (STRATTERA PO) Take by mouth.    [provider]  clotrimazole-betamethasone (LOTRISONE) cream Apply 1 application topically 2 (two) times daily. Patient not taking: Reported on 07/28/2020 03/15/20   Edrick Kins, DPM  CONCERTA 18 MG CR tablet  02/22/18   [provider]  fexofenadine (ALLEGRA) 180 MG tablet Take 180 mg by mouth daily.    [provider]  fluticasone (FLONASE) 50 MCG/ACT nasal spray Place 2 sprays into both nostrils daily.    [provider]  furosemide (LASIX) 20 MG tablet Take 1 tablet (20 mg total) by mouth daily. Patient not taking: Reported on 07/28/2020 12/14/19   Colon Branch, MD  glucose blood (ACCU-CHEK GUIDE) test strip 1 each by Other route 2 (two) times daily. Use as instructed 04/23/17   Waldon Merl, PA-C  Magnesium Oxide (MAG-OXIDE PO) Take 500 mg by mouth daily.    [provider]  metformin (FORTAMET) 1000 MG (OSM) 24 hr tablet Take 2 tablets (2,000 mg total) by mouth daily with breakfast. 10/27/20   Colon Branch, MD  METHYLPHENIDATE PO Take by mouth.    [provider]  metoprolol tartrate (LOPRESSOR) 100 MG tablet Take 1 tablet (100 mg total) by mouth 2 (two) times daily. 07/28/20   Colon Branch, MD  pantoprazole (PROTONIX) 40 MG tablet Take 1 tablet (40 mg total) by mouth daily. 05/08/20   Colon Branch, MD  SKYRIZI, 150 MG DOSE, 75 MG/0.83ML PSKT  04/09/19   [provider]    Allergies    Levaquin [levofloxacin in d5w], Celexa [citalopram], Dextrans, Hydrocodone-acetaminophen, Latex, and Oxycodone  Review of Systems   Review of Systems  Constitutional:  Negative for diaphoresis and fever.  HENT:  Negative for facial swelling.   Eyes:  Negative for visual disturbance.  Respiratory:  Negative for shortness of breath.   Cardiovascular:  Positive for palpitations. Negative for chest pain.  Gastrointestinal:  Negative for abdominal pain.   Genitourinary:  Negative for dysuria.  Musculoskeletal:  Negative for neck stiffness.  Skin:  Negative for rash.  Neurological:  Positive for syncope. Negative for headaches.  Psychiatric/Behavioral:  Negative for agitation.   All other systems reviewed and are negative.  Physical Exam Updated Vital Signs BP (!) 169/111   Pulse 76   Temp 98.2 F (36.8 C) (Oral)   Resp 13   SpO2 97%   Physical Exam Vitals and nursing note reviewed.  Exam conducted with a chaperone present.  Constitutional:      Appearance: Normal appearance. He is not diaphoretic.  HENT:     Head: Normocephalic and atraumatic.     Nose: Nose normal.  Eyes:     Conjunctiva/sclera: Conjunctivae normal.  Cardiovascular:     Rate and Rhythm: Tachycardia present. Rhythm irregular.     Pulses: Normal pulses.     Heart sounds: Normal heart sounds.  Pulmonary:     Effort: Pulmonary effort is normal.     Breath sounds: Normal breath sounds.  Abdominal:     General: Bowel sounds are normal.     Palpations: Abdomen is soft.     Tenderness: There is no abdominal tenderness. There is no guarding.  Musculoskeletal:        General: Normal range of motion.     Cervical back: Normal range of motion and neck supple.  Skin:    General: Skin is warm and dry.     Capillary Refill: Capillary refill takes less than 2 seconds.  Neurological:     General: No focal deficit present.     Mental Status: He is alert and oriented to person, place, and time.     Deep Tendon Reflexes: Reflexes normal.  Psychiatric:        Mood and Affect: Mood normal.        Behavior: Behavior normal.    ED Results / Procedures / Treatments   Labs (all labs ordered are listed, but only abnormal results are displayed) Results for orders placed or performed during the hospital encounter of 12/02/20  Resp Panel by RT-PCR (Flu A&B, Covid) Nasopharyngeal Swab   Specimen: Nasopharyngeal Swab; Nasopharyngeal(NP) swabs in vial transport medium   Result Value Ref Range   SARS Coronavirus 2 by RT PCR NEGATIVE NEGATIVE   Influenza A by PCR NEGATIVE NEGATIVE   Influenza B by PCR NEGATIVE NEGATIVE  CBC with Differential/Platelet  Result Value Ref Range   WBC 9.2 4.0 - 10.5 K/uL   RBC 5.18 4.22 - 5.81 MIL/uL   Hemoglobin 14.1 13.0 - 17.0 g/dL   HCT 42.0 39.0 - 52.0 %   MCV 81.1 80.0 - 100.0 fL   MCH 27.2 26.0 - 34.0 pg   MCHC 33.6 30.0 - 36.0 g/dL   RDW 13.2 11.5 - 15.5 %   Platelets 176 150 - 400 K/uL   nRBC 0.0 0.0 - 0.2 %   Neutrophils Relative % 66 %   Neutro Abs 6.0 1.7 - 7.7 K/uL   Lymphocytes Relative 26 %   Lymphs Abs 2.4 0.7 - 4.0 K/uL   Monocytes Relative 6 %   Monocytes Absolute 0.6 0.1 - 1.0 K/uL   Eosinophils Relative 1 %   Eosinophils Absolute 0.1 0.0 - 0.5 K/uL   Basophils Relative 0 %   Basophils Absolute 0.0 0.0 - 0.1 K/uL   Immature Granulocytes 1 %   Abs Immature Granulocytes 0.06 0.00 - 0.07 K/uL  Comprehensive metabolic panel  Result Value Ref Range   Sodium 137 135 - 145 mmol/L   Potassium 3.9 3.5 - 5.1 mmol/L   Chloride 108 98 - 111 mmol/L   CO2 20 (L) 22 - 32 mmol/L   Glucose, Bld 132 (H) 70 - 99 mg/dL   BUN 16 6 - 20 mg/dL   Creatinine, Ser 0.91 0.61 - 1.24 mg/dL   Calcium 8.9 8.9 - 10.3 mg/dL   Total Protein 6.4 (L) 6.5 - 8.1 g/dL   Albumin 3.8 3.5 -  5.0 g/dL   AST 19 15 - 41 U/L   ALT 20 0 - 44 U/L   Alkaline Phosphatase 71 38 - 126 U/L   Total Bilirubin 0.8 0.3 - 1.2 mg/dL   GFR, Estimated >60 >60 mL/min   Anion gap 9 5 - 15  I-stat chem 8, ED (not at [facility] or [facility])  Result Value Ref Range   Sodium 141 135 - 145 mmol/L   Potassium 3.8 3.5 - 5.1 mmol/L   Chloride 108 98 - 111 mmol/L   BUN 17 6 - 20 mg/dL   Creatinine, Ser 0.80 0.61 - 1.24 mg/dL   Glucose, Bld 131 (H) 70 - 99 mg/dL   Calcium, Ion 1.13 (L) 1.15 - 1.40 mmol/L   TCO2 21 (L) 22 - 32 mmol/L   Hemoglobin 13.9 13.0 - 17.0 g/dL   HCT 41.0 39.0 - 52.0 %  Troponin I (High Sensitivity)  Result Value Ref Range   Troponin I  (High Sensitivity) 92 (H) <18 ng/L   DG Chest Portable 1 View  Result Date: 12/02/2020 CLINICAL DATA:  Chest pain. EXAM: PORTABLE CHEST 1 VIEW COMPARISON:  Chest radiograph dated 07/11/2020. FINDINGS: No focal consolidation, pleural effusion, or pneumothorax. Mild cardiomegaly. No acute osseous pathology. IMPRESSION: 1. No acute cardiopulmonary process. 2. Mild cardiomegaly. Electronically Signed   By: Anner Crete M.D.   On: 12/02/2020 01:22    EKG  EKG Interpretation  Date/Time:  Saturday December 02 2020 00:56:38 EDT Ventricular Rate:  172 PR Interval:    QRS Duration: 94 QT Interval:  278 QTC Calculation: 471 R Axis:   53 Text Interpretation: Atrial fibrillation RSR' in V1 or V2, right VCD or RVH Repolarization abnormality, prob rate related Confirmed by Dory Horn) on 12/02/2020 2:34:13 AM        Radiology DG Chest Portable 1 View  Result Date: 12/02/2020 CLINICAL DATA:  Chest pain. EXAM: PORTABLE CHEST 1 VIEW COMPARISON:  Chest radiograph dated 07/11/2020. FINDINGS: No focal consolidation, pleural effusion, or pneumothorax. Mild cardiomegaly. No acute osseous pathology. IMPRESSION: 1. No acute cardiopulmonary process. 2. Mild cardiomegaly. Electronically Signed   By: Anner Crete M.D.   On: 12/02/2020 01:22    Procedures Procedures   Medications Ordered in ED Medications  diltiazem (CARDIZEM) 1 mg/mL load via infusion 15 mg (15 mg Intravenous Bolus from Bag 12/02/20 0121)    And  diltiazem (CARDIZEM) 125 mg in dextrose 5% 125 mL (1 mg/mL) infusion (12.5 mg/hr Intravenous Rate/Dose Change 12/02/20 0159)    ED Course  I have reviewed the triage vital signs and the nursing notes.  Pertinent labs & imaging results that were available during my care of the patient were reviewed by me and considered in my medical decision making (see chart for details).   MDM   Amount and/or Complexity of Data Reviewed Clinical lab tests: ordered and reviewed Decide  to obtain previous medical records or to obtain history from someone other than the patient: yes Review and summarize past medical records: yes Independent visualization of images, tracings, or specimens: yes  Risk of Complications, Morbidity, and/or Mortality Presenting problems: high Diagnostic procedures: high Management options: high  Critical Care Total time providing critical care: 30-74 minutes (diltiazem drip and heparin)   MDM Reviewed: nursing note and vitals Interpretation: labs, ECG, x-ray and CT scan (NACPD on CXR, elevated troponin) Total time providing critical care: 30-74 minutes (diltiazem drip and heparin). This excludes time spent performing separately reportable procedures and services. Consults: admitting MD  CRITICAL CARE Performed by: Aldrich Lloyd K Linwood Gullikson-Rasch Total critical care time: 60  minutes Critical care time was exclusive of separately billable procedures and treating other patients. Critical care was necessary to treat or prevent imminent or life-threatening deterioration. Critical care was time spent personally by me on the following activities: development of treatment plan with patient and/or surrogate as well as nursing, discussions with consultants, evaluation of patient's response to treatment, examination of patient, obtaining history from patient or surrogate, ordering and performing treatments and interventions, ordering and review of laboratory studies, ordering and review of radiographic studies, pulse oximetry and re-evaluation of patient's condition.   Final Clinical Impression(s) / ED Diagnoses Final diagnoses:  Atrial fibrillation with RVR ([place])  Syncope, unspecified syncope type   Admit to medicine for AFIB with RVR syncope and elevated troponin  Rx / DC Orders ED Discharge Orders     None        Havish Petties, MD 12/02/20 0236

## 2020-12-02 NOTE — Progress Notes (Signed)
Advised cardiology upon admission to unit that he is back into normal sinus rhythm. Verbally advised by Mendel Ryder to continue Cardizem and it will be reevaluated in the morning.

## 2020-12-02 NOTE — ED Triage Notes (Signed)
Pt from home, syncopal episode after feeling palpitations/lightheaded. Pt found to be in afib/RVR, given 18mg  adenosine, 30mg  diltiazem, 619mL fluid  20RAC 162/104 HR 160-180

## 2020-12-02 NOTE — Progress Notes (Signed)
[place] for Heparin  Indication: atrial fibrillation  Allergies  Allergen Reactions   Levaquin [Levofloxacin In D5w] Swelling   Celexa [Citalopram] Other (See Comments)    Mental status changes    Dextrans Other (See Comments)    Makes the pt. Drowsy.    Hydrocodone-Acetaminophen     REACTION: itching   Latex    Oxycodone Itching   Shiitake Mushroom Rash    Vital Signs: Temp: 98.1 F (36.7 C) (10/22 2010) Temp Source: Oral (10/22 2010) BP: 146/81 (10/22 2010) Pulse Rate: 77 (10/22 2010)  Labs: Recent Labs    12/02/20 0100 12/02/20 0108 12/02/20 0328 12/02/20 0703 12/02/20 1417 12/02/20 2026  HGB 14.1 13.9  --   --   --   --   HCT 42.0 41.0  --   --   --   --   PLT 176  --   --   --   --   --   HEPARINUNFRC  --   --   --   --  <0.10* <0.10*  CREATININE 0.91 0.80  --   --   --   --   TROPONINIHS 92*  --  243* 337*  --   --      CrCl cannot be calculated (Unknown ideal weight.).   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 CBC/renal function good. No anti-coagulation PTA.   Heparin level remains  undetectable, no infusion issues per nursing.  Goal of Therapy:  Heparin level 0.3-0.7 units/ml Monitor platelets by anticoagulation protocol: Yes   Plan:  Heparin 3500 unit bolus x1 Increase heparin to 2300 units/h Recheck heparin level in 6h  Arrie Senate, PharmD, [place], [place] Pharmacist [phone] Please check AMION for all [place] numbers 12/02/2020

## 2020-12-02 NOTE — Progress Notes (Signed)
Patient has home cpap for use and will self place.  

## 2020-12-02 NOTE — ED Notes (Signed)
Pt confirmed Allergy to Vicodin is related to Hydrocodone vs. Tylenol

## 2020-12-02 NOTE — Progress Notes (Signed)
Pt reports that while Cardizem rate was at 20 ml/hr he experienced a tinge feeling in his chest and hear rate seen as low as 60. Decreased Cardizem to 15 ml/hr. Pt reports that he feels better and tinge went away.

## 2020-12-03 ENCOUNTER — Inpatient Hospital Stay (HOSPITAL_COMMUNITY): Payer: Managed Care, Other (non HMO)

## 2020-12-03 DIAGNOSIS — R55 Syncope and collapse: Secondary | ICD-10-CM | POA: Diagnosis not present

## 2020-12-03 DIAGNOSIS — G4733 Obstructive sleep apnea (adult) (pediatric): Secondary | ICD-10-CM

## 2020-12-03 DIAGNOSIS — Z9989 Dependence on other enabling machines and devices: Secondary | ICD-10-CM

## 2020-12-03 DIAGNOSIS — R778 Other specified abnormalities of plasma proteins: Secondary | ICD-10-CM | POA: Diagnosis not present

## 2020-12-03 DIAGNOSIS — I4891 Unspecified atrial fibrillation: Secondary | ICD-10-CM | POA: Diagnosis not present

## 2020-12-03 DIAGNOSIS — E119 Type 2 diabetes mellitus without complications: Secondary | ICD-10-CM | POA: Diagnosis not present

## 2020-12-03 DIAGNOSIS — I10 Essential (primary) hypertension: Secondary | ICD-10-CM | POA: Diagnosis not present

## 2020-12-03 LAB — ECHOCARDIOGRAM COMPLETE
Area-P 1/2: 2.66 cm2
S' Lateral: 2.85 cm

## 2020-12-03 LAB — CBC
HCT: 40 % (ref 39.0–52.0)
Hemoglobin: 13.1 g/dL (ref 13.0–17.0)
MCH: 26.5 pg (ref 26.0–34.0)
MCHC: 32.8 g/dL (ref 30.0–36.0)
MCV: 81 fL (ref 80.0–100.0)
Platelets: 178 10*3/uL (ref 150–400)
RBC: 4.94 MIL/uL (ref 4.22–5.81)
RDW: 13.8 % (ref 11.5–15.5)
WBC: 9.5 10*3/uL (ref 4.0–10.5)
nRBC: 0 % (ref 0.0–0.2)

## 2020-12-03 LAB — GLUCOSE, CAPILLARY
Glucose-Capillary: 135 mg/dL — ABNORMAL HIGH (ref 70–99)
Glucose-Capillary: 136 mg/dL — ABNORMAL HIGH (ref 70–99)
Glucose-Capillary: 143 mg/dL — ABNORMAL HIGH (ref 70–99)
Glucose-Capillary: 138 mg/dL — ABNORMAL HIGH (ref 70–99)

## 2020-12-03 LAB — HEPARIN LEVEL (UNFRACTIONATED): Heparin Unfractionated: 0.29 IU/mL — ABNORMAL LOW (ref 0.30–0.70)

## 2020-12-03 MED ORDER — FLUTICASONE PROPIONATE 50 MCG/ACT NA SUSP
2.0000 | Freq: Every day | NASAL | Status: DC
  Administered 2020-12-03: 2 via NASAL
  Filled 2020-12-03: qty 16

## 2020-12-03 MED ORDER — DILTIAZEM HCL 60 MG PO TABS
30.0000 mg | ORAL_TABLET | Freq: Four times a day (QID) | ORAL | Status: DC
  Administered 2020-12-03 – 2020-12-04 (×5): 30 mg via ORAL
  Filled 2020-12-03 (×5): qty 1

## 2020-12-03 MED ORDER — APIXABAN 5 MG PO TABS
5.0000 mg | ORAL_TABLET | Freq: Two times a day (BID) | ORAL | Status: DC
  Administered 2020-12-03 – 2020-12-04 (×3): 5 mg via ORAL
  Filled 2020-12-03 (×3): qty 1

## 2020-12-03 NOTE — Progress Notes (Signed)
[facility] PROGRESS NOTE    Progress Note  KHALIB FENDLEY  ZRA:[id number] DOB: December 12, 1980 DOA: 12/02/2020 PCP: Colon Branch, MD     Brief Narrative:   Julian Washington is an 40 y.o. male past medical history significant for palpitation, non-insulin-dependent diabetes mellitus, essential hypertension chronic lymphedema, chronic venous stasis dermatitis morbid obesity, pituitary adenoma status post surgical removal in 2014 hypogonadism, with chronic anemia abdominal pain lymphadenopathy splenomegaly followed by hematology, pulmonary nodules presents to the ED for syncope and palpitations found to be in A. fib with RVR    Assessment/Plan:   Atrial fibrillation with rapid ventricular response ([place]) Converted back to sinus rhythm diltiazem drip had to be stopped overnight. IV heparin will need to be transitioned to Eliquis prior to discharge. We will start him on oral diltiazem every 6 if he does well overnight can transition him to long-acting and oral Eliquis. Chest Vascor greater than 2.  Elevated troponins: Likely demand ischemia in the setting of RVR. 2D echo was done awaiting for read.  Syncope Likely due to RVR 2D echo is pending.  Non-insulin-dependent diabetes mellitus type 2: With an A1c of 6.2 continue sliding scale insulin.  Essential hypertension: Hold diltiazem oral diltiazem    DVT prophylaxis: heparin Family Communication:none Status is: Inpatient  Not inpatient appropriate, will call [facility] team and downgrade to OBS.     Code Status:     Code Status Orders  (From admission, onward)           Start     Ordered   12/02/20 0403  Full code  Continuous        12/02/20 0406           Code Status History     This patient has a current code status but no historical code status.         IV Access:   Peripheral IV   Procedures and diagnostic studies:   CT HEAD WO CONTRAST (5MM)  Result Date: 12/02/2020 CLINICAL DATA:  Syncope. EXAM:  CT HEAD WITHOUT CONTRAST TECHNIQUE: Contiguous axial images were obtained from the base of the skull through the vertex without intravenous contrast. COMPARISON:  Head CT dated 11/17/2009. FINDINGS: Brain: No evidence of acute infarction, hemorrhage, hydrocephalus, extra-axial collection or mass lesion/mass effect. Vascular: No hyperdense vessel or unexpected calcification. Skull: Normal. Negative for fracture or focal lesion. Sinuses/Orbits: No acute finding. Other: None IMPRESSION: Normal noncontrast CT of the brain. Electronically Signed   By: Anner Crete M.D.   On: 12/02/2020 02:45   DG Chest Portable 1 View  Result Date: 12/02/2020 CLINICAL DATA:  Chest pain. EXAM: PORTABLE CHEST 1 VIEW COMPARISON:  Chest radiograph dated 07/11/2020. FINDINGS: No focal consolidation, pleural effusion, or pneumothorax. Mild cardiomegaly. No acute osseous pathology. IMPRESSION: 1. No acute cardiopulmonary process. 2. Mild cardiomegaly. Electronically Signed   By: Anner Crete M.D.   On: 12/02/2020 01:22     Medical Consultants:   None.   Subjective:    Harl Favor no complaints  Objective:    Vitals:   12/02/20 1600 12/02/20 1700 12/02/20 2010 12/03/20 0258  BP: (!) 144/90  (!) 146/81 137/67  Pulse: 77 73 77 70  Resp: (!) 30 12 15 15   Temp: 98.1 F (36.7 C)  98.1 F (36.7 C) 98.1 F (36.7 C)  TempSrc: Oral  Oral Oral  SpO2: 98% 97% 97% 97%   SpO2: 97 %   Intake/Output Summary (Last 24 hours) at 12/03/2020 5456 Last data  filed at 12/02/2020 1531 Gross per 24 hour  Intake 453.99 ml  Output --  Net 453.99 ml    There were no vitals filed for this visit.  Exam: General exam: In no acute distress. Respiratory system: Good air movement and clear to auscultation. Cardiovascular system: S1 & S2 heard, RRR. No JVD. Gastrointestinal system: Abdomen is nondistended, soft and nontender.  Extremities: No pedal edema. Skin: No rashes, lesions or ulcers Psychiatry: Judgement and  insight appear normal. Mood & affect appropriate. Data Reviewed:    Labs: Basic Metabolic Panel: Recent Labs  Lab 12/02/20 0100 12/02/20 0108 12/02/20 0416  NA 137 141  --   K 3.9 3.8  --   CL 108 108  --   CO2 20*  --   --   GLUCOSE 132* 131*  --   BUN 16 17  --   CREATININE 0.91 0.80  --   CALCIUM 8.9  --   --   MG  --   --  2.0    GFR CrCl cannot be calculated (Unknown ideal weight.). Liver Function Tests: Recent Labs  Lab 12/02/20 0100  AST 19  ALT 20  ALKPHOS 71  BILITOT 0.8  PROT 6.4*  ALBUMIN 3.8    No results for input(s): LIPASE, AMYLASE in the last 168 hours. No results for input(s): AMMONIA in the last 168 hours. Coagulation profile No results for input(s): INR, PROTIME in the last 168 hours. COVID-19 Labs  Recent Labs    12/02/20 0416  DDIMER 0.45     Lab Results  Component Value Date   SARSCOV2NAA NEGATIVE 12/02/2020    CBC: Recent Labs  Lab 12/02/20 0100 12/02/20 0108 12/03/20 0441  WBC 9.2  --  9.5  NEUTROABS 6.0  --   --   HGB 14.1 13.9 13.1  HCT 42.0 41.0 40.0  MCV 81.1  --  81.0  PLT 176  --  178    Cardiac Enzymes: No results for input(s): CKTOTAL, CKMB, CKMBINDEX, TROPONINI in the last 168 hours. BNP (last 3 results) No results for input(s): PROBNP in the last 8760 hours. CBG: Recent Labs  Lab 12/02/20 0843 12/02/20 1207 12/02/20 1756 12/02/20 2133  GLUCAP 156* 139* 110* 152*   D-Dimer: Recent Labs    12/02/20 0416  DDIMER 0.45    Hgb A1c: Recent Labs    12/02/20 0416  HGBA1C 6.2*    Lipid Profile: No results for input(s): CHOL, HDL, LDLCALC, TRIG, CHOLHDL, LDLDIRECT in the last 72 hours. Thyroid function studies: Recent Labs    12/02/20 0416  TSH 3.457    Anemia work up: No results for input(s): VITAMINB12, FOLATE, FERRITIN, TIBC, IRON, RETICCTPCT in the last 72 hours. Sepsis Labs: Recent Labs  Lab 12/02/20 0100 12/03/20 0441  WBC 9.2 9.5    Microbiology Recent Results (from the  past 240 hour(s))  Resp Panel by RT-PCR (Flu A&B, Covid) Nasopharyngeal Swab     Status: None   Collection Time: 12/02/20  1:00 AM   Specimen: Nasopharyngeal Swab; Nasopharyngeal(NP) swabs in vial transport medium  Result Value Ref Range Status   SARS Coronavirus 2 by RT PCR NEGATIVE NEGATIVE Final    Comment: (NOTE) SARS-CoV-2 target nucleic acids are NOT DETECTED.  The SARS-CoV-2 RNA is generally detectable in upper respiratory specimens during the acute phase of infection. The lowest concentration of SARS-CoV-2 viral copies this assay can detect is 138 copies/mL. A negative result does not preclude SARS-Cov-2 infection and should not be used as the  sole basis for treatment or other patient management decisions. A negative result may occur with  improper specimen collection/handling, submission of specimen other than nasopharyngeal swab, presence of viral mutation(s) within the areas targeted by this assay, and inadequate number of viral copies(<138 copies/mL). A negative result must be combined with clinical observations, patient history, and epidemiological information. The expected result is Negative.  Fact Sheet for Patients:  EntrepreneurPulse.com.au  Fact Sheet for Healthcare Providers:  IncredibleEmployment.be  This test is no t yet approved or cleared by the [place] FDA and  has been authorized for detection and/or diagnosis of SARS-CoV-2 by FDA under an Emergency Use Authorization (EUA). This EUA will remain  in effect (meaning this test can be used) for the duration of the COVID-19 declaration under Section 564(b)(1) of the Act, 21 U.S.C.section 360bbb-3(b)(1), unless the authorization is terminated  or revoked sooner.       Influenza A by PCR NEGATIVE NEGATIVE Final   Influenza B by PCR NEGATIVE NEGATIVE Final    Comment: (NOTE) The Xpert Xpress SARS-CoV-2/FLU/RSV plus assay is intended as an aid in the diagnosis of  influenza from Nasopharyngeal swab specimens and should not be used as a sole basis for treatment. Nasal washings and aspirates are unacceptable for Xpert Xpress SARS-CoV-2/FLU/RSV testing.  Fact Sheet for Patients: EntrepreneurPulse.com.au  Fact Sheet for Healthcare Providers: IncredibleEmployment.be  This test is not yet approved or cleared by the [place] FDA and has been authorized for detection and/or diagnosis of SARS-CoV-2 by FDA under an Emergency Use Authorization (EUA). This EUA will remain in effect (meaning this test can be used) for the duration of the COVID-19 declaration under Section 564(b)(1) of the Act, 21 U.S.C. section 360bbb-3(b)(1), unless the authorization is terminated or revoked.  Performed at [facility], [place] [address], [place], [place] [address]      Medications:    insulin aspart  0-5 Units Subcutaneous QHS   insulin aspart  0-9 Units Subcutaneous TID WC   pantoprazole  40 mg Oral QHS   Continuous Infusions:  diltiazem (CARDIZEM) infusion Stopped (12/03/20 0300)   heparin 2,450 Units/hr (12/03/20 0611)      LOS: 1 day   Charlynne Cousins  [facility]  12/03/2020, 7:24 AM

## 2020-12-03 NOTE — Progress Notes (Signed)
Pt HR down to 53- BP 137/57- stopped Cardizem drip per order on MAR and notified [facility] (Opyd) on call with this informtion

## 2020-12-03 NOTE — Progress Notes (Signed)
Progress Note  Patient Name: Julian Washington Date of Encounter: 12/03/2020  [facility] Cardiologist: Vickie Epley, MD   Subjective   While patient was off the floor for an echocardiogram, I spoke to his wife.  Pt returned to his room and denies chest pain.  He is still having a migraine HA.    Inpatient Medications    Scheduled Meds:  diltiazem  30 mg Oral Q6H   insulin aspart  0-5 Units Subcutaneous QHS   insulin aspart  0-9 Units Subcutaneous TID WC   pantoprazole  40 mg Oral QHS   Continuous Infusions:  heparin 2,450 Units/hr (12/03/20 0611)   PRN Meds: acetaminophen, ibuprofen, metoprolol tartrate, ondansetron (ZOFRAN) IV, traMADol   Vital Signs    Vitals:   12/02/20 1700 12/02/20 2010 12/03/20 0258 12/03/20 0754  BP:  (!) 146/81 137/67 (!) 147/81  Pulse: 73 77 70   Resp: 12 15 15    Temp:  98.1 F (36.7 C) 98.1 F (36.7 C)   TempSrc:  Oral Oral   SpO2: 97% 97% 97%     Intake/Output Summary (Last 24 hours) at 12/03/2020 0811 Last data filed at 12/02/2020 1531 Gross per 24 hour  Intake 453.99 ml  Output --  Net 453.99 ml   Last 3 Weights 07/28/2020 07/11/2020 04/27/2020  Weight (lbs) 383 lb 380 lb 395 lb 2 oz  Weight (kg) 173.728 kg 172.367 kg 179.228 kg      Telemetry    NSR, HR 50s-120s- Personally Reviewed  ECG    N/a - Personally Reviewed  Physical Exam   GEN: No acute distress.    Neck: supple Cardiac: RRR, no murmurs, rubs, or gallops.  Respiratory: Clear to auscultation bilaterally. GI: Soft   MS: 3+ bilateral LE edema with chronic stasis changes.  Neuro:  Nonfocal  Psych: Normal affect    Labs    High Sensitivity Troponin:   Recent Labs  Lab 12/02/20 0100 12/02/20 0328 12/02/20 0703  TROPONINIHS 92* 243* 337*     Chemistry Recent Labs  Lab 12/02/20 0100 12/02/20 0108 12/02/20 0416  NA 137 141  --   K 3.9 3.8  --   CL 108 108  --   CO2 20*  --   --   GLUCOSE 132* 131*  --   BUN 16 17  --   CREATININE 0.91  0.80  --   CALCIUM 8.9  --   --   MG  --   --  2.0  PROT 6.4*  --   --   ALBUMIN 3.8  --   --   AST 19  --   --   ALT 20  --   --   ALKPHOS 71  --   --   BILITOT 0.8  --   --   GFRNONAA >60  --   --   ANIONGAP 9  --   --     Lipids No results for input(s): CHOL, TRIG, HDL, LABVLDL, LDLCALC, CHOLHDL in the last 168 hours.  Hematology Recent Labs  Lab 12/02/20 0100 12/02/20 0108 12/03/20 0441  WBC 9.2  --  9.5  RBC 5.18  --  4.94  HGB 14.1 13.9 13.1  HCT 42.0 41.0 40.0  MCV 81.1  --  81.0  MCH 27.2  --  26.5  MCHC 33.6  --  32.8  RDW 13.2  --  13.8  PLT 176  --  178   Thyroid  Recent Labs  Lab 12/02/20  0416  TSH 3.457    BNPNo results for input(s): BNP, PROBNP in the last 168 hours.  DDimer  Recent Labs  Lab 12/02/20 0416  DDIMER 0.45     Radiology    CT HEAD WO CONTRAST (5MM)  Result Date: 12/02/2020 CLINICAL DATA:  Syncope. EXAM: CT HEAD WITHOUT CONTRAST TECHNIQUE: Contiguous axial images were obtained from the base of the skull through the vertex without intravenous contrast. COMPARISON:  Head CT dated 11/17/2009. FINDINGS: Brain: No evidence of acute infarction, hemorrhage, hydrocephalus, extra-axial collection or mass lesion/mass effect. Vascular: No hyperdense vessel or unexpected calcification. Skull: Normal. Negative for fracture or focal lesion. Sinuses/Orbits: No acute finding. Other: None IMPRESSION: Normal noncontrast CT of the brain. Electronically Signed   By: Anner Crete M.D.   On: 12/02/2020 02:45   DG Chest Portable 1 View  Result Date: 12/02/2020 CLINICAL DATA:  Chest pain. EXAM: PORTABLE CHEST 1 VIEW COMPARISON:  Chest radiograph dated 07/11/2020. FINDINGS: No focal consolidation, pleural effusion, or pneumothorax. Mild cardiomegaly. No acute osseous pathology. IMPRESSION: 1. No acute cardiopulmonary process. 2. Mild cardiomegaly. Electronically Signed   By: Anner Crete M.D.   On: 12/02/2020 01:22    Cardiac Studies   Echocardiogram  pending  Patient Profile     40 y.o. male with a hx of palpitations, NIDDM, hypertension, OSA on CPAP, morbid obesity, chronic anemia (abdominal lymphadenopathy/splenomegaly) followed by hematology who is being seen 12/02/2020 for the evaluation of atrial fibrillation with rapid ventricular rate.  He has been managed with IV Diltiazem for rate control and converted to NSR yesterday.    Assessment & Plan    1. Atrial fibrillation w RVR He converted to NSR yesterday.  Will get EKG in NSR.  HR in the 80s now.  Echocardiogram pending.  He will need to transition to oral anticoagulation with DOAC (Apixaban 5 mg twice daily). IV Diltiazem DC'd due to low HR.  He is currently on Diltiazem 30 mg po Q 6 hours.  He can transition to CD formulation if he tolerates this.    2. Hypertension  BP somewhat elevated.  Continue current dose of Diltiazem.  Continue to monitor.  May need to add ACE/ARB if BP remains above target.    3. Elevated hs-Troponin  Mild elevation in hs-Troponin with fairly flat trend.  This is probably most c/w demand ischemia.  Given risk factors for CAD and possible need for AAD therapy, will need to rule out ischemic heart disease.  Will arrange Coronary CTA for tomorrow.     4. Migraine HAs He had to stop Verapamil for prophylaxis due to increasing leg edema.  If his CCTA demonstrates no CAD, we could consider starting Flecainide and switching him back to Metoprolol.     For questions or updates, please contact [place] Please consult [email or web address] for contact info under        Signed, Richardson Dopp, PA-C  12/03/2020, 8:11 AM

## 2020-12-03 NOTE — Progress Notes (Signed)
[facility]  Indication: atrial fibrillation  Allergies  Allergen Reactions   Levaquin [Levofloxacin In D5w] Swelling   Celexa [Citalopram] Other (See Comments)    Mental status changes    Dextrans Other (See Comments)    Makes the pt. Drowsy.    Hydrocodone-Acetaminophen     REACTION: itching   Latex    Oxycodone Itching   Shiitake Mushroom Rash    Vital Signs: Temp: 98.1 F (36.7 C) (10/23 0258) Temp Source: Oral (10/23 0258) BP: 137/67 (10/23 0258) Pulse Rate: 70 (10/23 0258)  Labs: Recent Labs    12/02/20 0100 12/02/20 0108 12/02/20 0328 12/02/20 0703 12/02/20 1417 12/02/20 2026 12/03/20 0441  HGB 14.1 13.9  --   --   --   --  13.1  HCT 42.0 41.0  --   --   --   --  40.0  PLT 176  --   --   --   --   --  178  HEPARINUNFRC  --   --   --   --  <0.10* <0.10* 0.29*  CREATININE 0.91 0.80  --   --   --   --   --   TROPONINIHS 92*  --  243* 337*  --   --   --      CrCl cannot be calculated (Unknown ideal weight.).  Assessment: 40 y.o. male with Afib for heparin  Goal of Therapy:  Heparin level 0.3-0.7 units/ml Monitor platelets by anticoagulation protocol: Yes   Plan:  Increase Heparin 2450 units/hr  Phillis Knack, PharmD, BCPS

## 2020-12-03 NOTE — Progress Notes (Signed)
Patient to self-administer CPAP using his machine from home.  Patient is familiar with equipment and procedure.

## 2020-12-03 NOTE — Progress Notes (Signed)
Echocardiogram 2D Echocardiogram has been performed.  Oneal Deputy Sariya Trickey RDCS 12/03/2020, 8:25 AM

## 2020-12-03 NOTE — Progress Notes (Signed)
ANTICOAGULATION CONSULT NOTE - Follow Up Consult  Pharmacy Consult for Heparin >> Apixaban Indication: atrial fibrillation  Allergies  Allergen Reactions   Levaquin [Levofloxacin In D5w] Swelling   Celexa [Citalopram] Other (See Comments)    Mental status changes    Dextrans Other (See Comments)    Makes the pt. Drowsy.    Hydrocodone-Acetaminophen     REACTION: itching   Latex    Oxycodone Itching   Shiitake Mushroom Rash    Patient Measurements: Height: 5\' 11"  (180.3 cm) Weight: (!) 167.6 kg (369 lb 9.6 oz) IBW/kg (Calculated) : 75.3  Vital Signs: Temp: 98.1 F (36.7 C) (10/23 0258) Temp Source: Oral (10/23 0258) BP: 147/81 (10/23 0754) Pulse Rate: 70 (10/23 0258)  Labs: Recent Labs    12/02/20 0100 12/02/20 0108 12/02/20 0328 12/02/20 0703 12/02/20 1417 12/02/20 2026 12/03/20 0441  HGB 14.1 13.9  --   --   --   --  13.1  HCT 42.0 41.0  --   --   --   --  40.0  PLT 176  --   --   --   --   --  178  HEPARINUNFRC  --   --   --   --  <0.10* <0.10* 0.29*  CREATININE 0.91 0.80  --   --   --   --   --   TROPONINIHS 92*  --  243* 337*  --   --   --     Estimated Creatinine Clearance: 196.7 mL/min (by C-G formula based on SCr of 0.8 mg/dL).   Medications:  Scheduled:   diltiazem  30 mg Oral Q6H   insulin aspart  0-5 Units Subcutaneous QHS   insulin aspart  0-9 Units Subcutaneous TID WC   pantoprazole  40 mg Oral QHS   Infusions:   heparin 2,450 Units/hr (12/03/20 9528)    Assessment: 40 yo M with new onset atrial fibrillation. No anticoagulation PTA. Pharmacy consulted for heparin dosing.  Heparin level subtherapeutic at 0.29 this morning, on 2300 units/hr. Heparin infusion rate was increased to 2450 units/hr. CBC stable. No line issues or signs/symptoms of bleeding noted per RN.   Per cardiology, pharmacy consulted to transition from heparin to apixaban.  Goal of Therapy:  Heparin level 0.3-0.7 units/ml Monitor platelets by anticoagulation protocol:  Yes   Plan:  Stop heparin, and start apixaban 5mg  PO BID.  Monitor CBC and for signs/symptoms of bleeding.   Vance Peper, PharmD PGY1 Pharmacy Resident Phone [phone] 12/03/2020 10:20 AM   Please check AMION for all [place] phone numbers After 10:00 PM, call [place] [phone]

## 2020-12-04 ENCOUNTER — Other Ambulatory Visit (HOSPITAL_COMMUNITY): Payer: Self-pay

## 2020-12-04 ENCOUNTER — Inpatient Hospital Stay (HOSPITAL_COMMUNITY): Payer: Managed Care, Other (non HMO)

## 2020-12-04 DIAGNOSIS — G4733 Obstructive sleep apnea (adult) (pediatric): Secondary | ICD-10-CM | POA: Diagnosis not present

## 2020-12-04 DIAGNOSIS — R7989 Other specified abnormal findings of blood chemistry: Secondary | ICD-10-CM

## 2020-12-04 DIAGNOSIS — R778 Other specified abnormalities of plasma proteins: Secondary | ICD-10-CM

## 2020-12-04 DIAGNOSIS — R55 Syncope and collapse: Secondary | ICD-10-CM | POA: Diagnosis not present

## 2020-12-04 DIAGNOSIS — I4891 Unspecified atrial fibrillation: Secondary | ICD-10-CM | POA: Diagnosis not present

## 2020-12-04 DIAGNOSIS — R9431 Abnormal electrocardiogram [ECG] [EKG]: Secondary | ICD-10-CM | POA: Diagnosis not present

## 2020-12-04 LAB — CBC
HCT: 39.5 % (ref 39.0–52.0)
Hemoglobin: 12.9 g/dL — ABNORMAL LOW (ref 13.0–17.0)
MCH: 26.8 pg (ref 26.0–34.0)
MCHC: 32.7 g/dL (ref 30.0–36.0)
MCV: 82.1 fL (ref 80.0–100.0)
Platelets: 195 10*3/uL (ref 150–400)
RBC: 4.81 MIL/uL (ref 4.22–5.81)
RDW: 13.7 % (ref 11.5–15.5)
WBC: 7.1 10*3/uL (ref 4.0–10.5)
nRBC: 0 % (ref 0.0–0.2)

## 2020-12-04 LAB — MAGNESIUM: Magnesium: 2.2 mg/dL (ref 1.7–2.4)

## 2020-12-04 LAB — BASIC METABOLIC PANEL
Anion gap: 6 (ref 5–15)
BUN: 14 mg/dL (ref 6–20)
CO2: 24 mmol/L (ref 22–32)
Calcium: 8.7 mg/dL — ABNORMAL LOW (ref 8.9–10.3)
Chloride: 106 mmol/L (ref 98–111)
Creatinine, Ser: 0.85 mg/dL (ref 0.61–1.24)
GFR, Estimated: >60 mL/min (ref >60)
Glucose, Bld: 137 mg/dL — ABNORMAL HIGH (ref 70–99)
Potassium: 4.1 mmol/L (ref 3.5–5.1)
Sodium: 136 mmol/L (ref 135–145)

## 2020-12-04 LAB — GLUCOSE, CAPILLARY
Glucose-Capillary: 123 mg/dL — ABNORMAL HIGH (ref 70–99)
Glucose-Capillary: 113 mg/dL — ABNORMAL HIGH (ref 70–99)

## 2020-12-04 MED ORDER — METOPROLOL TARTRATE 5 MG/5ML IV SOLN
INTRAVENOUS | Status: AC
  Administered 2020-12-04: 10 mg
  Filled 2020-12-04: qty 10

## 2020-12-04 MED ORDER — FLECAINIDE ACETATE 100 MG PO TABS
100.0000 mg | ORAL_TABLET | Freq: Two times a day (BID) | ORAL | 3 refills | Status: DC
  Filled 2020-12-04: qty 60, 30d supply, fill #0

## 2020-12-04 MED ORDER — METOPROLOL SUCCINATE ER 50 MG PO TB24
50.0000 mg | ORAL_TABLET | Freq: Two times a day (BID) | ORAL | Status: DC
  Administered 2020-12-04: 50 mg via ORAL
  Filled 2020-12-04: qty 1

## 2020-12-04 MED ORDER — METOPROLOL TARTRATE 50 MG PO TABS
50.0000 mg | ORAL_TABLET | Freq: Once | ORAL | Status: AC
  Administered 2020-12-04: 50 mg via ORAL
  Filled 2020-12-04: qty 1

## 2020-12-04 MED ORDER — DILTIAZEM HCL ER COATED BEADS 120 MG PO CP24: 120.0000 mg | ORAL_CAPSULE | Freq: Every day | ORAL | Status: DC

## 2020-12-04 MED ORDER — METOPROLOL SUCCINATE ER 50 MG PO TB24
50.0000 mg | ORAL_TABLET | Freq: Two times a day (BID) | ORAL | 3 refills | Status: DC
  Filled 2020-12-04: qty 60, 30d supply, fill #0

## 2020-12-04 MED ORDER — NITROGLYCERIN 0.4 MG SL SUBL
SUBLINGUAL_TABLET | SUBLINGUAL | Status: AC
  Administered 2020-12-04: 0.8 mg
  Filled 2020-12-04: qty 2

## 2020-12-04 MED ORDER — IOHEXOL 350 MG/ML SOLN
130.0000 mL | Freq: Once | INTRAVENOUS | Status: AC | PRN
  Administered 2020-12-04: 130 mL via INTRAVENOUS

## 2020-12-04 MED ORDER — APIXABAN 5 MG PO TABS
5.0000 mg | ORAL_TABLET | Freq: Two times a day (BID) | ORAL | 3 refills | Status: DC
Start: 2020-12-04 — End: 2020-12-28
  Filled 2020-12-04: qty 60, 30d supply, fill #0

## 2020-12-04 MED ORDER — FLECAINIDE ACETATE 50 MG PO TABS: 100.0000 mg | ORAL_TABLET | Freq: Two times a day (BID) | ORAL | Status: DC

## 2020-12-04 MED ORDER — DILTIAZEM HCL 60 MG PO TABS
30.0000 mg | ORAL_TABLET | Freq: Four times a day (QID) | ORAL | Status: DC
  Administered 2020-12-04: 30 mg via ORAL
  Filled 2020-12-04: qty 1

## 2020-12-04 NOTE — Progress Notes (Signed)
Progress Note  Patient Name: Julian Washington Date of Encounter: 12/04/2020  [facility] Cardiologist: Vickie Epley, MD   Subjective   Denies any CP or SOB. HR goes up to 120s with waking.   Inpatient Medications    Scheduled Meds:  apixaban  5 mg Oral BID   diltiazem  30 mg Oral Q6H   fluticasone  2 spray Each Nare Daily   insulin aspart  0-5 Units Subcutaneous QHS   insulin aspart  0-9 Units Subcutaneous TID WC   pantoprazole  40 mg Oral QHS   Continuous Infusions:  PRN Meds: acetaminophen, ibuprofen, metoprolol tartrate, ondansetron (ZOFRAN) IV, traMADol   Vital Signs    Vitals:   12/03/20 1213 12/03/20 2000 12/04/20 0617 12/04/20 0700  BP: (!) 149/81   (!) 153/76  Pulse: 86 84    Resp:   18 18  Temp:   98.4 F (36.9 C) (!) 97.5 F (36.4 C)  TempSrc:   Oral Oral  SpO2: 98% 98%  100%  Weight:      Height:        Intake/Output Summary (Last 24 hours) at 12/04/2020 0800 Last data filed at 12/03/2020 1800 Gross per 24 hour  Intake 1322 ml  Output --  Net 1322 ml   Last 3 Weights 12/03/2020 07/28/2020 07/11/2020  Weight (lbs) 369 lb 9.6 oz 383 lb 380 lb  Weight (kg) 167.649 kg 173.728 kg 172.367 kg      Telemetry    NSR without recurrence of afib. - Personally Reviewed  ECG    NSR without significant ST-T wave changes - Personally Reviewed  Physical Exam   GEN: No acute distress.   Neck: No JVD Cardiac: RRR, no murmurs, rubs, or gallops.  Respiratory: Clear to auscultation bilaterally. GI: Soft, nontender, non-distended  MS: No edema; No deformity. Neuro:  Nonfocal  Psych: Normal affect   Labs    High Sensitivity Troponin:   Recent Labs  Lab 12/02/20 0100 12/02/20 0328 12/02/20 0703  TROPONINIHS 92* 243* 337*     Chemistry Recent Labs  Lab 12/02/20 0100 12/02/20 0108 12/02/20 0416 12/04/20 0219  NA 137 141  --  136  K 3.9 3.8  --  4.1  CL 108 108  --  106  CO2 20*  --   --  24  GLUCOSE 132* 131*  --  137*  BUN 16 17   --  14  CREATININE 0.91 0.80  --  0.85  CALCIUM 8.9  --   --  8.7*  MG  --   --  2.0  --   PROT 6.4*  --   --   --   ALBUMIN 3.8  --   --   --   AST 19  --   --   --   ALT 20  --   --   --   ALKPHOS 71  --   --   --   BILITOT 0.8  --   --   --   GFRNONAA >60  --   --  >60  ANIONGAP 9  --   --  6    Lipids No results for input(s): CHOL, TRIG, HDL, LABVLDL, LDLCALC, CHOLHDL in the last 168 hours.  Hematology Recent Labs  Lab 12/02/20 0100 12/02/20 0108 12/03/20 0441 12/04/20 0219  WBC 9.2  --  9.5 7.1  RBC 5.18  --  4.94 4.81  HGB 14.1 13.9 13.1 12.9*  HCT 42.0 41.0 40.0 39.5  MCV 81.1  --  81.0 82.1  MCH 27.2  --  26.5 26.8  MCHC 33.6  --  32.8 32.7  RDW 13.2  --  13.8 13.7  PLT 176  --  178 195   Thyroid  Recent Labs  Lab 12/02/20 0416  TSH 3.457    BNPNo results for input(s): BNP, PROBNP in the last 168 hours.  DDimer  Recent Labs  Lab 12/02/20 0416  DDIMER 0.45     Radiology    ECHOCARDIOGRAM COMPLETE  Result Date: 12/03/2020    ECHOCARDIOGRAM REPORT   Patient Name:   Julian Washington Date of Exam: 12/03/2020 Medical Rec #:  [id number]     Height:       71.0 in Accession #:    [id number]    Weight:       383.0 lb Date of Birth:  September 22, 1980     BSA:          2.779 m Patient Age:    40 years      BP:           137/67 mmHg Patient Gender: M             HR:           75 bpm. Exam Location:  Inpatient Procedure: 2D Echo, Color Doppler and Cardiac Doppler Indications:    I48.91* Unspecified atrial fibrillation  History:        Patient has prior history of Echocardiogram examinations, most                 recent 03/20/2016. Risk Factors:Hypertension, Diabetes,                 Dyslipidemia and Sleep Apnea.  Sonographer:    Raquel Sarna Senior RDCS Referring Phys: [id number] Shela Leff  Sonographer Comments: Poor echo windows due to patient body habitus. IMPRESSIONS  1. Left ventricular ejection fraction, by estimation, is 60 to 65%. The left ventricle has normal function. The left  ventricle has no regional wall motion abnormalities. There is mild concentric left ventricular hypertrophy. Left ventricular diastolic parameters were normal.  2. Right ventricular systolic function is normal. The right ventricular size is normal.  3. The mitral valve is grossly normal. No evidence of mitral valve regurgitation. No evidence of mitral stenosis.  4. The aortic valve was not well visualized. Aortic valve regurgitation is not visualized. No aortic stenosis is present. Comparison(s): No significant change from prior study. Conclusion(s)/Recommendation(s): Normal biventricular function without evidence of hemodynamically significant valvular heart disease. Technically challenging study, but no significant abnormalities seen. FINDINGS  Left Ventricle: Left ventricular ejection fraction, by estimation, is 60 to 65%. The left ventricle has normal function. The left ventricle has no regional wall motion abnormalities. The left ventricular internal cavity size was normal in size. There is  mild concentric left ventricular hypertrophy. Left ventricular diastolic parameters were normal. Right Ventricle: The right ventricular size is normal. Right vetricular wall thickness was not well visualized. Right ventricular systolic function is normal. Left Atrium: Left atrial size was normal in size. Right Atrium: Right atrial size was normal in size. Pericardium: There is no evidence of pericardial effusion. Presence of pericardial fat pad. Mitral Valve: The mitral valve is grossly normal. There is mild thickening of the mitral valve leaflet(s). No evidence of mitral valve regurgitation. No evidence of mitral valve stenosis. Tricuspid Valve: The tricuspid valve is grossly normal. Tricuspid valve regurgitation is not demonstrated. No evidence of tricuspid stenosis. Aortic Valve:  The aortic valve was not well visualized. Aortic valve regurgitation is not visualized. No aortic stenosis is present. Pulmonic Valve: The  pulmonic valve was not well visualized. Pulmonic valve regurgitation is not visualized. Aorta: The ascending aorta was not well visualized and the aortic root is normal in size and structure. Venous: The inferior vena cava was not well visualized. IAS/Shunts: The interatrial septum was not well visualized.  LEFT VENTRICLE PLAX 2D LVIDd:         4.45 cm   Diastology LVIDs:         2.85 cm   LV e' medial:    9.36 cm/s LV PW:         1.20 cm   LV E/e' medial:  9.8 LV IVS:        1.20 cm   LV e' lateral:   11.50 cm/s LVOT diam:     2.10 cm   LV E/e' lateral: 8.0 LV SV:         71 LV SV Index:   26 LVOT Area:     3.46 cm  RIGHT VENTRICLE RV S prime:     16.20 cm/s TAPSE (M-mode): 1.6 cm LEFT ATRIUM             Index        RIGHT ATRIUM           Index LA diam:        3.65 cm 1.31 cm/m   RA Area:     12.50 cm LA Vol (A2C):   46.5 ml 16.73 ml/m  RA Volume:   27.30 ml  9.82 ml/m LA Vol (A4C):   62.0 ml 22.31 ml/m LA Biplane Vol: 58.0 ml 20.87 ml/m  AORTIC VALVE LVOT Vmax:   96.40 cm/s LVOT Vmean:  67.700 cm/s LVOT VTI:    0.205 m  AORTA Ao Root diam: 3.40 cm MITRAL VALVE MV Area (PHT): 2.66 cm    SHUNTS MV Decel Time: 285 msec    Systemic VTI:  0.20 m MV E velocity: 92.10 cm/s  Systemic Diam: 2.10 cm MV A velocity: 52.20 cm/s MV E/A ratio:  1.76 Buford Dresser MD Electronically signed by Buford Dresser MD Signature Date/Time: 12/03/2020/11:46:39 AM    Final     Cardiac Studies   Echo 12/03/2020   1. Left ventricular ejection fraction, by estimation, is 60 to 65%. The  left ventricle has normal function. The left ventricle has no regional  wall motion abnormalities. There is mild concentric left ventricular  hypertrophy. Left ventricular diastolic  parameters were normal.   2. Right ventricular systolic function is normal. The right ventricular  size is normal.   3. The mitral valve is grossly normal. No evidence of mitral valve  regurgitation. No evidence of mitral stenosis.   4. The  aortic valve was not well visualized. Aortic valve regurgitation  is not visualized. No aortic stenosis is present.   Comparison(s): No significant change from prior study.  Patient Profile     40 y.o. male with PMH of palpitation, NIDDM, HTN, OSA on CPAP, morbid obesity, chronic anemia followed by hem/onc, h/o splenomegaly presented with afib with RVR on 10/22. He was also found to have elevated troponin, planning for coronary CT   Assessment & Plan    Atrial fibrillation with RVR  - converted to sinus rhythm on rate control. Started on Elqiuis - HR controlled, will transition short acting diltiazem 30mg  q6Hr to 120mg  daily of diltiazem CD - Echo 12/03/2020 EF  60-65%, no RWMA, no significant valve issue - per admission note, patient has been having increased palpitation despite on 100mg  BID of metoprolol at home. Patient is currently on 30mg  q6Hr for rate control, per note from yesterday, the plan is to swich to flecainide and metoprolol succinate 25mg  BID +/- long acting diltiazem if coronary CT is ok.   - spoke with coronary CT nurse, HR still elevated in the 80s, he is on metoprolol tartrate 100mg  BID at home. Currently also on short acting diltiazem. I will reorder BB as metoprolol succinate 50mg  BID. Hopefully with short acting diltiazem on the side, we can adequately control his HR for the CT image. Dr. Quentin Ore recommended flecainide 100mg  BID if coronary CT is ok.  - possible discharge later today by hospitalist service once medication titrated.    HTN: elevated BP in the 150s. Home metoprolol 100mg  BID held.    Elevated troponin: pending coronary CT this morning, if no CAD, consider starting flecainide  Migraine headache: no NSAID with NOAC, tylenol if possible, tramadol PRN for severe case      For questions or updates, please contact [place] Please consult [email or web address] for contact info under        Signed, Almyra Deforest, [place]  12/04/2020, 8:00 AM

## 2020-12-04 NOTE — Progress Notes (Signed)
Clarified with CT that pt needs to be NPO for Coronary CT. Order placed. Pt has been NPO since [place]. Jessie Foot, RN

## 2020-12-04 NOTE — Progress Notes (Signed)
[facility] PROGRESS NOTE    Progress Note  Julian Washington  ZOX:[id number] DOB: [birth year] DOA: 12/02/2020 PCP: Colon Branch, MD     Brief Narrative:   Julian Washington is an 40 y.o. male past medical history significant for palpitation, non-insulin-dependent diabetes mellitus, essential hypertension chronic lymphedema, chronic venous stasis dermatitis morbid obesity, pituitary adenoma status post surgical removal in 2014 hypogonadism, with chronic anemia abdominal pain lymphadenopathy splenomegaly followed by hematology, pulmonary nodules presents to the ED for syncope and palpitations found to be in A. fib with RVR    Assessment/Plan:   Atrial fibrillation with rapid ventricular response ([place]) Converted back to sinus rhythm diltiazem drip had to be stopped overnight. Now on ELiquis, metoprolol and diltiazem short acting, rate controlled. CT coronaries if negative EP recommended Flecainide and metoprolol.  Elevated troponins: Likely demand ischemia in the setting of RVR. 2D echo was done awaiting for read.  Syncope Likely due to RVR 2D echo is pending.  Non-insulin-dependent diabetes mellitus type 2: With an A1c of 6.2 continue sliding scale insulin.  Essential hypertension: Hold diltiazem oral diltiazem    DVT prophylaxis: heparin Family Communication:none Status is: Inpatient  Not inpatient appropriate, will call [facility] team and downgrade to OBS.     Code Status:     Code Status Orders  (From admission, onward)           Start     Ordered   12/02/20 0403  Full code  Continuous        12/02/20 0406           Code Status History     This patient has a current code status but no historical code status.         IV Access:   Peripheral IV   Procedures and diagnostic studies:   ECHOCARDIOGRAM COMPLETE  Result Date: 12/03/2020    ECHOCARDIOGRAM REPORT   Patient Name:   Julian Washington Date of Exam: 12/03/2020 Medical Rec #:  [id number]      Height:       71.0 in Accession #:    [id number]    Weight:       383.0 lb Date of Birth:  1980-07-12     BSA:          2.779 m Patient Age:    18 years      BP:           137/67 mmHg Patient Gender: M             HR:           75 bpm. Exam Location:  Inpatient Procedure: 2D Echo, Color Doppler and Cardiac Doppler Indications:    I48.91* Unspecified atrial fibrillation  History:        Patient has prior history of Echocardiogram examinations, most                 recent 03/20/2016. Risk Factors:Hypertension, Diabetes,                 Dyslipidemia and Sleep Apnea.  Sonographer:    Raquel Sarna Senior RDCS Referring Phys: [id number] Shela Leff  Sonographer Comments: Poor echo windows due to patient body habitus. IMPRESSIONS  1. Left ventricular ejection fraction, by estimation, is 60 to 65%. The left ventricle has normal function. The left ventricle has no regional wall motion abnormalities. There is mild concentric left ventricular hypertrophy. Left ventricular diastolic parameters were normal.  2. Right ventricular systolic function is  normal. The right ventricular size is normal.  3. The mitral valve is grossly normal. No evidence of mitral valve regurgitation. No evidence of mitral stenosis.  4. The aortic valve was not well visualized. Aortic valve regurgitation is not visualized. No aortic stenosis is present. Comparison(s): No significant change from prior study. Conclusion(s)/Recommendation(s): Normal biventricular function without evidence of hemodynamically significant valvular heart disease. Technically challenging study, but no significant abnormalities seen. FINDINGS  Left Ventricle: Left ventricular ejection fraction, by estimation, is 60 to 65%. The left ventricle has normal function. The left ventricle has no regional wall motion abnormalities. The left ventricular internal cavity size was normal in size. There is  mild concentric left ventricular hypertrophy. Left ventricular diastolic parameters were  normal. Right Ventricle: The right ventricular size is normal. Right vetricular wall thickness was not well visualized. Right ventricular systolic function is normal. Left Atrium: Left atrial size was normal in size. Right Atrium: Right atrial size was normal in size. Pericardium: There is no evidence of pericardial effusion. Presence of pericardial fat pad. Mitral Valve: The mitral valve is grossly normal. There is mild thickening of the mitral valve leaflet(s). No evidence of mitral valve regurgitation. No evidence of mitral valve stenosis. Tricuspid Valve: The tricuspid valve is grossly normal. Tricuspid valve regurgitation is not demonstrated. No evidence of tricuspid stenosis. Aortic Valve: The aortic valve was not well visualized. Aortic valve regurgitation is not visualized. No aortic stenosis is present. Pulmonic Valve: The pulmonic valve was not well visualized. Pulmonic valve regurgitation is not visualized. Aorta: The ascending aorta was not well visualized and the aortic root is normal in size and structure. Venous: The inferior vena cava was not well visualized. IAS/Shunts: The interatrial septum was not well visualized.  LEFT VENTRICLE PLAX 2D LVIDd:         4.45 cm   Diastology LVIDs:         2.85 cm   LV e' medial:    9.36 cm/s LV PW:         1.20 cm   LV E/e' medial:  9.8 LV IVS:        1.20 cm   LV e' lateral:   11.50 cm/s LVOT diam:     2.10 cm   LV E/e' lateral: 8.0 LV SV:         71 LV SV Index:   26 LVOT Area:     3.46 cm  RIGHT VENTRICLE RV S prime:     16.20 cm/s TAPSE (M-mode): 1.6 cm LEFT ATRIUM             Index        RIGHT ATRIUM           Index LA diam:        3.65 cm 1.31 cm/m   RA Area:     12.50 cm LA Vol (A2C):   46.5 ml 16.73 ml/m  RA Volume:   27.30 ml  9.82 ml/m LA Vol (A4C):   62.0 ml 22.31 ml/m LA Biplane Vol: 58.0 ml 20.87 ml/m  AORTIC VALVE LVOT Vmax:   96.40 cm/s LVOT Vmean:  67.700 cm/s LVOT VTI:    0.205 m  AORTA Ao Root diam: 3.40 cm MITRAL VALVE MV Area (PHT):  2.66 cm    SHUNTS MV Decel Time: 285 msec    Systemic VTI:  0.20 m MV E velocity: 92.10 cm/s  Systemic Diam: 2.10 cm MV A velocity: 52.20 cm/s MV E/A ratio:  1.76 Bridgette Christopher  MD Electronically signed by Buford Dresser MD Signature Date/Time: 12/03/2020/11:46:39 AM    Final      Medical Consultants:   None.   Subjective:    Harl Favor no complaints  Objective:    Vitals:   12/03/20 1213 12/03/20 2000 12/04/20 0617 12/04/20 0700  BP: (!) 149/81   (!) 153/76  Pulse: 86 84    Resp:   18 18  Temp:   98.4 F (36.9 C) (!) 97.5 F (36.4 C)  TempSrc:   Oral Oral  SpO2: 98% 98%  100%  Weight:      Height:       SpO2: 100 %   Intake/Output Summary (Last 24 hours) at 12/04/2020 0959 Last data filed at 12/03/2020 1800 Gross per 24 hour  Intake 1322 ml  Output --  Net 1322 ml    Filed Weights   12/03/20 0959  Weight: (!) 167.6 kg    Exam: General exam: In no acute distress morbidly obese Respiratory system: Good air movement and clear to auscultation. Cardiovascular system: S1 & S2 heard, RRR. No JVD. Gastrointestinal system: Abdomen is nondistended, soft and nontender.  Extremities: No pedal edema. Skin: No rashes, lesions or ulcers Psychiatry: Judgement and insight appear normal. Mood & affect appropriate. Data Reviewed:    Labs: Basic Metabolic Panel: Recent Labs  Lab 12/02/20 0100 12/02/20 0108 12/02/20 0416 12/04/20 0219  NA 137 141  --  136  K 3.9 3.8  --  4.1  CL 108 108  --  106  CO2 20*  --   --  24  GLUCOSE 132* 131*  --  137*  BUN 16 17  --  14  CREATININE 0.91 0.80  --  0.85  CALCIUM 8.9  --   --  8.7*  MG  --   --  2.0  --     GFR Estimated Creatinine Clearance: 185.2 mL/min (by C-G formula based on SCr of 0.85 mg/dL). Liver Function Tests: Recent Labs  Lab 12/02/20 0100  AST 19  ALT 20  ALKPHOS 71  BILITOT 0.8  PROT 6.4*  ALBUMIN 3.8    No results for input(s): LIPASE, AMYLASE in the last 168 hours. No  results for input(s): AMMONIA in the last 168 hours. Coagulation profile No results for input(s): INR, PROTIME in the last 168 hours. COVID-19 Labs  Recent Labs    12/02/20 0416  DDIMER 0.45     Lab Results  Component Value Date   SARSCOV2NAA NEGATIVE 12/02/2020    CBC: Recent Labs  Lab 12/02/20 0100 12/02/20 0108 12/03/20 0441 12/04/20 0219  WBC 9.2  --  9.5 7.1  NEUTROABS 6.0  --   --   --   HGB 14.1 13.9 13.1 12.9*  HCT 42.0 41.0 40.0 39.5  MCV 81.1  --  81.0 82.1  PLT 176  --  178 195    Cardiac Enzymes: No results for input(s): CKTOTAL, CKMB, CKMBINDEX, TROPONINI in the last 168 hours. BNP (last 3 results) No results for input(s): PROBNP in the last 8760 hours. CBG: Recent Labs  Lab 12/03/20 0731 12/03/20 1137 12/03/20 1616 12/03/20 2255 12/04/20 0727  GLUCAP 135* 136* 143* 138* 123*    D-Dimer: Recent Labs    12/02/20 0416  DDIMER 0.45    Hgb A1c: Recent Labs    12/02/20 0416  HGBA1C 6.2*    Lipid Profile: No results for input(s): CHOL, HDL, LDLCALC, TRIG, CHOLHDL, LDLDIRECT in the last 72 hours. Thyroid function studies:  Recent Labs    12/02/20 0416  TSH 3.457    Anemia work up: No results for input(s): VITAMINB12, FOLATE, FERRITIN, TIBC, IRON, RETICCTPCT in the last 72 hours. Sepsis Labs: Recent Labs  Lab 12/02/20 0100 12/03/20 0441 12/04/20 0219  WBC 9.2 9.5 7.1    Microbiology Recent Results (from the past 240 hour(s))  Resp Panel by RT-PCR (Flu A&B, Covid) Nasopharyngeal Swab     Status: None   Collection Time: 12/02/20  1:00 AM   Specimen: Nasopharyngeal Swab; Nasopharyngeal(NP) swabs in vial transport medium  Result Value Ref Range Status   SARS Coronavirus 2 by RT PCR NEGATIVE NEGATIVE Final    Comment: (NOTE) SARS-CoV-2 target nucleic acids are NOT DETECTED.  The SARS-CoV-2 RNA is generally detectable in upper respiratory specimens during the acute phase of infection. The lowest concentration of SARS-CoV-2  viral copies this assay can detect is 138 copies/mL. A negative result does not preclude SARS-Cov-2 infection and should not be used as the sole basis for treatment or other patient management decisions. A negative result may occur with  improper specimen collection/handling, submission of specimen other than nasopharyngeal swab, presence of viral mutation(s) within the areas targeted by this assay, and inadequate number of viral copies(<138 copies/mL). A negative result must be combined with clinical observations, patient history, and epidemiological information. The expected result is Negative.  Fact Sheet for Patients:  EntrepreneurPulse.com.au  Fact Sheet for Healthcare Providers:  IncredibleEmployment.be  This test is no t yet approved or cleared by the [place] FDA and  has been authorized for detection and/or diagnosis of SARS-CoV-2 by FDA under an Emergency Use Authorization (EUA). This EUA will remain  in effect (meaning this test can be used) for the duration of the COVID-19 declaration under Section 564(b)(1) of the Act, 21 U.S.C.section 360bbb-3(b)(1), unless the authorization is terminated  or revoked sooner.       Influenza A by PCR NEGATIVE NEGATIVE Final   Influenza B by PCR NEGATIVE NEGATIVE Final    Comment: (NOTE) The Xpert Xpress SARS-CoV-2/FLU/RSV plus assay is intended as an aid in the diagnosis of influenza from Nasopharyngeal swab specimens and should not be used as a sole basis for treatment. Nasal washings and aspirates are unacceptable for Xpert Xpress SARS-CoV-2/FLU/RSV testing.  Fact Sheet for Patients: EntrepreneurPulse.com.au  Fact Sheet for Healthcare Providers: IncredibleEmployment.be  This test is not yet approved or cleared by the [place] FDA and has been authorized for detection and/or diagnosis of SARS-CoV-2 by FDA under an Emergency Use Authorization  (EUA). This EUA will remain in effect (meaning this test can be used) for the duration of the COVID-19 declaration under Section 564(b)(1) of the Act, 21 U.S.C. section 360bbb-3(b)(1), unless the authorization is terminated or revoked.  Performed at [facility], [place] [address], [place], [place] [address]      Medications:    apixaban  5 mg Oral BID   diltiazem  30 mg Oral Q6H   fluticasone  2 spray Each Nare Daily   insulin aspart  0-5 Units Subcutaneous QHS   insulin aspart  0-9 Units Subcutaneous TID WC   metoprolol succinate  50 mg Oral BID   pantoprazole  40 mg Oral QHS   Continuous Infusions:      LOS: 2 days   Charlynne Cousins  [facility]  12/04/2020, 9:59 AM

## 2020-12-04 NOTE — Discharge Instructions (Addendum)
Julian Washington was admitted to the Hospital on 12/02/2020 and Discharged on Discharge Date 12/04/2020 and should be excused from work/school   For 3   days starting 12/02/2020 , may return to work/school without any restrictions.  Call Bess Harvest MD, [facility] [phone] with questions.  Charlynne Cousins M.D on 12/04/2020,at 5:03 PM  [facility] Office  [phone]      Information on my medicine - ELIQUIS (apixaban)  hy was Eliquis prescribed for you? Eliquis was prescribed for you to reduce the risk of a blood clot forming that can cause a stroke if you have a medical condition called atrial fibrillation (a type of irregular heartbeat).  What do You need to know about Eliquis ? Take your Eliquis TWICE DAILY - one tablet in the morning and one tablet in the evening with or without food. If you have difficulty swallowing the tablet whole please discuss with your pharmacist how to take the medication safely.  Take Eliquis exactly as prescribed by your doctor and DO NOT stop taking Eliquis without talking to the doctor who prescribed the medication.  Stopping may increase your risk of developing a stroke.  Refill your prescription before you run out.  After discharge, you should have regular check-up appointments with your healthcare provider that is prescribing your Eliquis.  In the future your dose may need to be changed if your kidney function or weight changes by a significant amount or as you get older.  What do you do if you miss a dose? If you miss a dose, take it as soon as you remember on the same day and resume taking twice daily.  Do not take more than one dose of ELIQUIS at the same time to make up a missed dose.  Important Safety Information A possible side effect of Eliquis is bleeding. You should call your healthcare provider right away if you experience any of the following: Bleeding from an injury or your nose that does not  stop. Unusual colored urine (red or dark brown) or unusual colored stools (red or black). Unusual bruising for unknown reasons. A serious fall or if you hit your head (even if there is no bleeding).  Some medicines may interact with Eliquis and might increase your risk of bleeding or clotting while on Eliquis. To help avoid this, consult your healthcare provider or pharmacist prior to using any new prescription or non-prescription medications, including herbals, vitamins, non-steroidal anti-inflammatory drugs (NSAIDs) and supplements.  This website has more information on Eliquis (apixaban): http://[email or web address].com/eliquis/home

## 2020-12-04 NOTE — Discharge Summary (Addendum)
Physician Discharge Summary  Julian Washington BOF:[id number] DOB: [birth year] DOA: 12/02/2020  PCP: Colon Branch, MD  Admit date: 12/02/2020 Discharge date: 12/04/2020  Admitted From: home Disposition:  home  Recommendations for Outpatient Follow-up:  Follow up with Cards in 1-2 weeks Please obtain BMP/CBC in one week   Home Health:None Equipment/Devices:no  Discharge Condition:Stable CODE STATUS:Full Diet recommendation: Heart Healthy   Brief/Interim Summary: 40 y.o. male past medical history significant for palpitation, non-insulin-dependent diabetes mellitus, essential hypertension chronic lymphedema, chronic venous stasis dermatitis morbid obesity, pituitary adenoma status post surgical removal in 2014 hypogonadism, with chronic anemia abdominal pain lymphadenopathy splenomegaly followed by hematology, pulmonary nodules presents to the ED for syncope and palpitations found to be in A. fib with RVR  Discharge Diagnoses:  Principal Problem:   Atrial fibrillation with rapid ventricular response ([place]) Active Problems:   OSA on CPAP   Type 2 diabetes mellitus without complication, without long-term current use of insulin (HCC)   HTN (hypertension)   Syncope   Elevated troponin I level  A. fib with RVR: He was started on IV diltiazem and IV heparin he converted to sinus rhythm changed to oral diltiazem and metoprolol EP was consulted recommended a CT of the coronaries which showed a calcium score of 0. He was transitioned to oral Eliquis metoprolol and oral flecainide which she will continue at home as an outpatient follow-up with EP.  Elevated troponins: Likely demand ischemia in the setting of RVR 2D echo showed no wall motion abnormality.  Syncope: Likely due to RVR no events on telemetry.  Non-insulin-dependent diabetes mellitus type 2 with an A1c of 6.2 continue metformin as an outpatient.  Essential hypertension: Continue metoprolol no changes made to his  medication.    Discharge Instructions  Discharge Instructions     Diet - low sodium heart healthy   Complete by: As directed    Increase activity slowly   Complete by: As directed       Allergies as of 12/04/2020       Reactions   Levaquin [levofloxacin In D5w] Swelling   Celexa [citalopram] Other (See Comments)   Mental status changes    Dextrans Other (See Comments)   Makes the pt. Drowsy.    Hydrocodone-acetaminophen    REACTION: itching   Latex    Oxycodone Itching   Shiitake Mushroom Rash        Medication List     STOP taking these medications    ibuprofen 200 MG tablet Commonly known as: ADVIL   metoprolol tartrate 100 MG tablet Commonly known as: LOPRESSOR       TAKE these medications    Accu-Chek FastClix Lancets Misc 1 Package by Does not apply route 2 (two) times daily.   acetaminophen 500 MG tablet Commonly known as: TYLENOL Take 500-1,000 mg by mouth every 6 (six) hours as needed for moderate pain or headache.   albuterol 108 (90 Base) MCG/ACT inhaler Commonly known as: VENTOLIN HFA Inhale 2 puffs into the lungs every 4 (four) hours as needed for wheezing or shortness of breath.   apixaban 5 MG Tabs tablet Commonly known as: ELIQUIS Take 1 tablet (5 mg total) by mouth 2 (two) times daily.   atomoxetine 100 MG capsule Commonly known as: STRATTERA Take 100 mg by mouth daily.   clotrimazole-betamethasone cream Commonly known as: Lotrisone Apply 1 application topically 2 (two) times daily.   fexofenadine 180 MG tablet Commonly known as: ALLEGRA Take 180 mg by mouth at bedtime.  flecainide 100 MG tablet Commonly known as: TAMBOCOR Take 1 tablet (100 mg total) by mouth every 12 (twelve) hours. Start taking on: December 05, 2020   fluticasone 50 MCG/ACT nasal spray Commonly known as: FLONASE Place 1 spray into both nostrils at bedtime.   furosemide 20 MG tablet Commonly known as: LASIX Take 1 tablet (20 mg total) by mouth  daily. What changed:  when to take this reasons to take this   glucose blood test strip Commonly known as: Accu-Chek Guide 1 each by Other route 2 (two) times daily. Use as instructed   MAG-OXIDE PO Take 400 mg by mouth at bedtime.   metFORMIN 500 MG 24 hr tablet Commonly known as: GLUCOPHAGE-XR Take 500 mg by mouth 2 (two) times daily with a meal.   methylphenidate 18 MG CR tablet Commonly known as: CONCERTA Take 18 mg by mouth daily.   metoprolol succinate 50 MG 24 hr tablet Commonly known as: TOPROL-XL Take 1 tablet (50 mg total) by mouth 2 (two) times daily. Take with or immediately following a meal.   pantoprazole 40 MG tablet Commonly known as: PROTONIX Take 1 tablet (40 mg total) by mouth daily. What changed: when to take this   Skyrizi (150 MG Dose) 75 MG/0.83ML Pskt Generic drug: Risankizumab-rzaa(150 MG Dose) Inject 150 mg into the skin See admin instructions. Every 3 months        Allergies  Allergen Reactions   Levaquin [Levofloxacin In D5w] Swelling   Celexa [Citalopram] Other (See Comments)    Mental status changes    Dextrans Other (See Comments)    Makes the pt. Drowsy.    Hydrocodone-Acetaminophen     REACTION: itching   Latex    Oxycodone Itching   Shiitake Mushroom Rash    Consultations: Electrophysiology   Procedures/Studies: CT HEAD WO CONTRAST (5MM)  Result Date: 12/02/2020 CLINICAL DATA:  Syncope. EXAM: CT HEAD WITHOUT CONTRAST TECHNIQUE: Contiguous axial images were obtained from the base of the skull through the vertex without intravenous contrast. COMPARISON:  Head CT dated 11/17/2009. FINDINGS: Brain: No evidence of acute infarction, hemorrhage, hydrocephalus, extra-axial collection or mass lesion/mass effect. Vascular: No hyperdense vessel or unexpected calcification. Skull: Normal. Negative for fracture or focal lesion. Sinuses/Orbits: No acute finding. Other: None IMPRESSION: Normal noncontrast CT of the brain. Electronically  Signed   By: Anner Crete M.D.   On: 12/02/2020 02:45   CT CORONARY MORPH W/CTA COR W/SCORE W/CA W/CM &/OR WO/CM  Addendum Date: 12/04/2020   ADDENDUM REPORT: 12/04/2020 15:07 HISTORY: ECG abnormal, 66yr CHD risk 10-20%, not treadmill candidate Atrial fibrillation/flutter (AF) EXAM: Cardiac/Coronary  CT TECHNIQUE: The patient was scanned on a [employer]. PROTOCOL: A 120 kV prospective scan was triggered in the descending thoracic aorta at 111 HU's. Axial non-contrast 3 mm slices were carried out through the heart. The data set was analyzed on a dedicated work station and scored using the Agatston method. Gantry rotation speed was 250 msecs and collimation was .6 mm. Beta blockade and 0.8 mg of sl NTG was given. The 3D data set was reconstructed in 5% intervals of the 35-75 % of the R-R cycle. Systolic and diastolic phases were analyzed on a dedicated work station using MPR, MIP and VRT modes. The patient received 147mL OMNIPAQUE IOHEXOL 350 MG/ML SOLN contrast. FINDINGS: Image quality: Good Noise artifact is: Limited Coronary calcium score is 0. Coronary arteries: Normal coronary origins.  Co-dominance. Right Coronary Artery: No detectable plaque or stenosis. Left Main Coronary Artery: No  detectable plaque or stenosis. Left Anterior Descending Coronary Artery: No detectable plaque or stenosis. Left Circumflex Artery: No detectable plaque or stenosis. Aorta: Normal size, 32 mm at the mid ascending aorta (level of the PA bifurcation) measured double oblique. No calcifications. No dissection. Aortic Valve: No calcifications.  Tricuspid aortic valve. Other findings: Normal pulmonary vein drainage into the left atrium. Normal left atrial appendage without thrombus. Normal size of the pulmonary artery. IMPRESSION: 1. No evidence of CAD, CADRADS = 0. 2. Coronary calcium score of 0. 3. Normal coronary origins with co-dominance. Electronically Signed   By: Cherlynn Kaiser M.D.   On: 12/04/2020 15:07    Result Date: 12/04/2020 EXAM: OVER-READ INTERPRETATION  CT CHEST The following report is an over-read performed by radiologist Dr. Aletta Edouard of [facility], [place] on 12/04/2020. This over-read does not include interpretation of cardiac or coronary anatomy or pathology. The Interpretation by the cardiologist is attached. COMPARISON:  CT of the chest on 02/03/2019 FINDINGS: Vascular: No significant noncardiac vascular findings. Mediastinum/Nodes: Visualized mediastinum and hilar regions demonstrate no lymphadenopathy or masses. Lungs/Pleura: Stable subpleural/perifissural nodule along the anterior margin of the minor fissure in the right lung measuring up to 5 mm. Visualized lungs show no evidence of pulmonary edema, consolidation, pneumothorax or pleural fluid. Upper Abdomen: Stable appearance of probable hepatic steatosis. Musculoskeletal: No chest wall mass or suspicious bone lesions identified. IMPRESSION: 1. Stable subpleural/perifissural nodule along the margin of the right minor fissure which is stable since 2020 and therefore benign. 2. Stable appearance of hepatic steatosis. Electronically Signed: By: Aletta Edouard M.D. On: 12/04/2020 14:41   DG Chest Portable 1 View  Result Date: 12/02/2020 CLINICAL DATA:  Chest pain. EXAM: PORTABLE CHEST 1 VIEW COMPARISON:  Chest radiograph dated 07/11/2020. FINDINGS: No focal consolidation, pleural effusion, or pneumothorax. Mild cardiomegaly. No acute osseous pathology. IMPRESSION: 1. No acute cardiopulmonary process. 2. Mild cardiomegaly. Electronically Signed   By: Anner Crete M.D.   On: 12/02/2020 01:22   ECHOCARDIOGRAM COMPLETE  Result Date: 12/03/2020    ECHOCARDIOGRAM REPORT   Patient Name:   Julian Washington Date of Exam: 12/03/2020 Medical Rec #:  [id number]     Height:       71.0 in Accession #:    [id number]    Weight:       383.0 lb Date of Birth:  November 18, 1980     BSA:          2.779 m Patient Age:    40 years      BP:            137/67 mmHg Patient Gender: M             HR:           75 bpm. Exam Location:  Inpatient Procedure: 2D Echo, Color Doppler and Cardiac Doppler Indications:    I48.91* Unspecified atrial fibrillation  History:        Patient has prior history of Echocardiogram examinations, most                 recent 03/20/2016. Risk Factors:Hypertension, Diabetes,                 Dyslipidemia and Sleep Apnea.  Sonographer:    Raquel Sarna Senior RDCS Referring Phys: [id number] Shela Leff  Sonographer Comments: Poor echo windows due to patient body habitus. IMPRESSIONS  1. Left ventricular ejection fraction, by estimation, is 60 to 65%. The left ventricle has normal function. The left ventricle has  no regional wall motion abnormalities. There is mild concentric left ventricular hypertrophy. Left ventricular diastolic parameters were normal.  2. Right ventricular systolic function is normal. The right ventricular size is normal.  3. The mitral valve is grossly normal. No evidence of mitral valve regurgitation. No evidence of mitral stenosis.  4. The aortic valve was not well visualized. Aortic valve regurgitation is not visualized. No aortic stenosis is present. Comparison(s): No significant change from prior study. Conclusion(s)/Recommendation(s): Normal biventricular function without evidence of hemodynamically significant valvular heart disease. Technically challenging study, but no significant abnormalities seen. FINDINGS  Left Ventricle: Left ventricular ejection fraction, by estimation, is 60 to 65%. The left ventricle has normal function. The left ventricle has no regional wall motion abnormalities. The left ventricular internal cavity size was normal in size. There is  mild concentric left ventricular hypertrophy. Left ventricular diastolic parameters were normal. Right Ventricle: The right ventricular size is normal. Right vetricular wall thickness was not well visualized. Right ventricular systolic function is normal. Left  Atrium: Left atrial size was normal in size. Right Atrium: Right atrial size was normal in size. Pericardium: There is no evidence of pericardial effusion. Presence of pericardial fat pad. Mitral Valve: The mitral valve is grossly normal. There is mild thickening of the mitral valve leaflet(s). No evidence of mitral valve regurgitation. No evidence of mitral valve stenosis. Tricuspid Valve: The tricuspid valve is grossly normal. Tricuspid valve regurgitation is not demonstrated. No evidence of tricuspid stenosis. Aortic Valve: The aortic valve was not well visualized. Aortic valve regurgitation is not visualized. No aortic stenosis is present. Pulmonic Valve: The pulmonic valve was not well visualized. Pulmonic valve regurgitation is not visualized. Aorta: The ascending aorta was not well visualized and the aortic root is normal in size and structure. Venous: The inferior vena cava was not well visualized. IAS/Shunts: The interatrial septum was not well visualized.  LEFT VENTRICLE PLAX 2D LVIDd:         4.45 cm   Diastology LVIDs:         2.85 cm   LV e' medial:    9.36 cm/s LV PW:         1.20 cm   LV E/e' medial:  9.8 LV IVS:        1.20 cm   LV e' lateral:   11.50 cm/s LVOT diam:     2.10 cm   LV E/e' lateral: 8.0 LV SV:         71 LV SV Index:   26 LVOT Area:     3.46 cm  RIGHT VENTRICLE RV S prime:     16.20 cm/s TAPSE (M-mode): 1.6 cm LEFT ATRIUM             Index        RIGHT ATRIUM           Index LA diam:        3.65 cm 1.31 cm/m   RA Area:     12.50 cm LA Vol (A2C):   46.5 ml 16.73 ml/m  RA Volume:   27.30 ml  9.82 ml/m LA Vol (A4C):   62.0 ml 22.31 ml/m LA Biplane Vol: 58.0 ml 20.87 ml/m  AORTIC VALVE LVOT Vmax:   96.40 cm/s LVOT Vmean:  67.700 cm/s LVOT VTI:    0.205 m  AORTA Ao Root diam: 3.40 cm MITRAL VALVE MV Area (PHT): 2.66 cm    SHUNTS MV Decel Time: 285 msec    Systemic VTI:  0.20 m MV E velocity: 92.10 cm/s  Systemic Diam: 2.10 cm MV A velocity: 52.20 cm/s MV E/A ratio:  1.76 Buford Dresser MD Electronically signed by Buford Dresser MD Signature Date/Time: 12/03/2020/11:46:39 AM    Final       Subjective: No complaints  Discharge Exam: Vitals:   12/04/20 1420 12/04/20 1500  BP: 110/70 139/84  Pulse: 75   Resp:  17  Temp:  98.4 F (36.9 C)  SpO2:  96%   Vitals:   12/04/20 0700 12/04/20 1200 12/04/20 1420 12/04/20 1500  BP: (!) 153/76 (!) 153/92 110/70 139/84  Pulse:  72 75   Resp: 18 17  17   Temp: (!) 97.5 F (36.4 C) 98.4 F (36.9 C)  98.4 F (36.9 C)  TempSrc: Oral Oral  Oral  SpO2: 100% 96%  96%  Weight:      Height:        General: Pt is alert, awake, not in acute distress Cardiovascular: RRR, S1/S2 +, no rubs, no gallops Respiratory: CTA bilaterally, no wheezing, no rhonchi Abdominal: Soft, NT, ND, bowel sounds + Extremities: no edema, no cyanosis    The results of significant diagnostics from this hospitalization (including imaging, microbiology, ancillary and laboratory) are listed below for reference.     Microbiology: Recent Results (from the past 240 hour(s))  Resp Panel by RT-PCR (Flu A&B, Covid) Nasopharyngeal Swab     Status: None   Collection Time: 12/02/20  1:00 AM   Specimen: Nasopharyngeal Swab; Nasopharyngeal(NP) swabs in vial transport medium  Result Value Ref Range Status   SARS Coronavirus 2 by RT PCR NEGATIVE NEGATIVE Final    Comment: (NOTE) SARS-CoV-2 target nucleic acids are NOT DETECTED.  The SARS-CoV-2 RNA is generally detectable in upper respiratory specimens during the acute phase of infection. The lowest concentration of SARS-CoV-2 viral copies this assay can detect is 138 copies/mL. A negative result does not preclude SARS-Cov-2 infection and should not be used as the sole basis for treatment or other patient management decisions. A negative result may occur with  improper specimen collection/handling, submission of specimen other than nasopharyngeal swab, presence of viral mutation(s) within  the areas targeted by this assay, and inadequate number of viral copies(<138 copies/mL). A negative result must be combined with clinical observations, patient history, and epidemiological information. The expected result is Negative.  Fact Sheet for Patients:  EntrepreneurPulse.com.au  Fact Sheet for Healthcare Providers:  IncredibleEmployment.be  This test is no t yet approved or cleared by the [place] FDA and  has been authorized for detection and/or diagnosis of SARS-CoV-2 by FDA under an Emergency Use Authorization (EUA). This EUA will remain  in effect (meaning this test can be used) for the duration of the COVID-19 declaration under Section 564(b)(1) of the Act, 21 U.S.C.section 360bbb-3(b)(1), unless the authorization is terminated  or revoked sooner.       Influenza A by PCR NEGATIVE NEGATIVE Final   Influenza B by PCR NEGATIVE NEGATIVE Final    Comment: (NOTE) The Xpert Xpress SARS-CoV-2/FLU/RSV plus assay is intended as an aid in the diagnosis of influenza from Nasopharyngeal swab specimens and should not be used as a sole basis for treatment. Nasal washings and aspirates are unacceptable for Xpert Xpress SARS-CoV-2/FLU/RSV testing.  Fact Sheet for Patients: EntrepreneurPulse.com.au  Fact Sheet for Healthcare Providers: IncredibleEmployment.be  This test is not yet approved or cleared by the [place] FDA and has been authorized for detection and/or diagnosis of SARS-CoV-2 by FDA under an Emergency  Use Authorization (EUA). This EUA will remain in effect (meaning this test can be used) for the duration of the COVID-19 declaration under Section 564(b)(1) of the Act, 21 U.S.C. section 360bbb-3(b)(1), unless the authorization is terminated or revoked.  Performed at [facility], [place] [address], [place], [place] [address]      Labs: BNP (last 3 results) No results for  input(s): BNP in the last 8760 hours. Basic Metabolic Panel: Recent Labs  Lab 12/02/20 0100 12/02/20 0108 12/02/20 0416 12/04/20 0219 12/04/20 1006  NA 137 141  --  136  --   K 3.9 3.8  --  4.1  --   CL 108 108  --  106  --   CO2 20*  --   --  24  --   GLUCOSE 132* 131*  --  137*  --   BUN 16 17  --  14  --   CREATININE 0.91 0.80  --  0.85  --   CALCIUM 8.9  --   --  8.7*  --   MG  --   --  2.0  --  2.2   Liver Function Tests: Recent Labs  Lab 12/02/20 0100  AST 19  ALT 20  ALKPHOS 71  BILITOT 0.8  PROT 6.4*  ALBUMIN 3.8   No results for input(s): LIPASE, AMYLASE in the last 168 hours. No results for input(s): AMMONIA in the last 168 hours. CBC: Recent Labs  Lab 12/02/20 0100 12/02/20 0108 12/03/20 0441 12/04/20 0219  WBC 9.2  --  9.5 7.1  NEUTROABS 6.0  --   --   --   HGB 14.1 13.9 13.1 12.9*  HCT 42.0 41.0 40.0 39.5  MCV 81.1  --  81.0 82.1  PLT 176  --  178 195   Cardiac Enzymes: No results for input(s): CKTOTAL, CKMB, CKMBINDEX, TROPONINI in the last 168 hours. BNP: Invalid input(s): POCBNP CBG: Recent Labs  Lab 12/03/20 1137 12/03/20 1616 12/03/20 2255 12/04/20 0727 12/04/20 1146  GLUCAP 136* 143* 138* 123* 113*   D-Dimer Recent Labs    12/02/20 0416  DDIMER 0.45   Hgb A1c Recent Labs    12/02/20 0416  HGBA1C 6.2*   Lipid Profile No results for input(s): CHOL, HDL, LDLCALC, TRIG, CHOLHDL, LDLDIRECT in the last 72 hours. Thyroid function studies Recent Labs    12/02/20 0416  TSH 3.457   Anemia work up No results for input(s): VITAMINB12, FOLATE, FERRITIN, TIBC, IRON, RETICCTPCT in the last 72 hours. Urinalysis    Component Value Date/Time   COLORURINE YELLOW 09/20/2017 0730   APPEARANCEUR CLEAR 09/20/2017 0730   LABSPEC 1.010 09/20/2017 0730   PHURINE 6.0 09/20/2017 0730   GLUCOSEU NEGATIVE 09/20/2017 0730   GLUCOSEU NEGATIVE 02/22/2015 0902   HGBUR NEGATIVE 09/20/2017 0730   BILIRUBINUR NEGATIVE 09/20/2017 0730    KETONESUR NEGATIVE 09/20/2017 0730   PROTEINUR NEGATIVE 09/20/2017 0730   UROBILINOGEN 0.2 02/22/2015 0902   NITRITE NEGATIVE 09/20/2017 0730   LEUKOCYTESUR NEGATIVE 09/20/2017 0730   Sepsis Labs Invalid input(s): PROCALCITONIN,  WBC,  LACTICIDVEN Microbiology Recent Results (from the past 240 hour(s))  Resp Panel by RT-PCR (Flu A&B, Covid) Nasopharyngeal Swab     Status: None   Collection Time: 12/02/20  1:00 AM   Specimen: Nasopharyngeal Swab; Nasopharyngeal(NP) swabs in vial transport medium  Result Value Ref Range Status   SARS Coronavirus 2 by RT PCR NEGATIVE NEGATIVE Final    Comment: (NOTE) SARS-CoV-2 target nucleic acids are NOT DETECTED.  The SARS-CoV-2 RNA is generally detectable in upper respiratory specimens during the acute phase of infection. The lowest concentration of SARS-CoV-2 viral copies this assay can detect is 138 copies/mL. A negative result does not preclude SARS-Cov-2 infection and should not be used as the sole basis for treatment or other patient management decisions. A negative result may occur with  improper specimen collection/handling, submission of specimen other than nasopharyngeal swab, presence of viral mutation(s) within the areas targeted by this assay, and inadequate number of viral copies(<138 copies/mL). A negative result must be combined with clinical observations, patient history, and epidemiological information. The expected result is Negative.  Fact Sheet for Patients:  EntrepreneurPulse.com.au  Fact Sheet for Healthcare Providers:  IncredibleEmployment.be  This test is no t yet approved or cleared by the [place] FDA and  has been authorized for detection and/or diagnosis of SARS-CoV-2 by FDA under an Emergency Use Authorization (EUA). This EUA will remain  in effect (meaning this test can be used) for the duration of the COVID-19 declaration under Section 564(b)(1) of the Act,  21 U.S.C.section 360bbb-3(b)(1), unless the authorization is terminated  or revoked sooner.       Influenza A by PCR NEGATIVE NEGATIVE Final   Influenza B by PCR NEGATIVE NEGATIVE Final    Comment: (NOTE) The Xpert Xpress SARS-CoV-2/FLU/RSV plus assay is intended as an aid in the diagnosis of influenza from Nasopharyngeal swab specimens and should not be used as a sole basis for treatment. Nasal washings and aspirates are unacceptable for Xpert Xpress SARS-CoV-2/FLU/RSV testing.  Fact Sheet for Patients: EntrepreneurPulse.com.au  Fact Sheet for Healthcare Providers: IncredibleEmployment.be  This test is not yet approved or cleared by the [place] FDA and has been authorized for detection and/or diagnosis of SARS-CoV-2 by FDA under an Emergency Use Authorization (EUA). This EUA will remain in effect (meaning this test can be used) for the duration of the COVID-19 declaration under Section 564(b)(1) of the Act, 21 U.S.C. section 360bbb-3(b)(1), unless the authorization is terminated or revoked.  Performed at [facility], [place] [address] [place], [place] [address]     SIGNED:   Charlynne Cousins, MD  [facility] 12/04/2020, 4:11 PM Pager   If 7PM-7AM, please contact night-coverage [email or web address] Password [email or web address]

## 2020-12-05 ENCOUNTER — Other Ambulatory Visit (HOSPITAL_COMMUNITY): Payer: Self-pay

## 2020-12-07 ENCOUNTER — Other Ambulatory Visit: Payer: Self-pay

## 2020-12-07 ENCOUNTER — Encounter: Payer: Self-pay | Admitting: Internal Medicine

## 2020-12-07 ENCOUNTER — Ambulatory Visit: Payer: Managed Care, Other (non HMO) | Admitting: Internal Medicine

## 2020-12-07 VITALS — BP 126/78 | HR 71 | Temp 98.1°F | Resp 18 | Ht 71.0 in | Wt 368.1 lb

## 2020-12-07 DIAGNOSIS — F909 Attention-deficit hyperactivity disorder, unspecified type: Secondary | ICD-10-CM

## 2020-12-07 DIAGNOSIS — I480 Paroxysmal atrial fibrillation: Secondary | ICD-10-CM | POA: Diagnosis not present

## 2020-12-07 DIAGNOSIS — I4891 Unspecified atrial fibrillation: Secondary | ICD-10-CM | POA: Diagnosis not present

## 2020-12-07 LAB — CBC WITH DIFFERENTIAL/PLATELET
Basophils Absolute: 0 10*3/uL (ref 0.0–0.1)
Basophils Relative: 0.6 % (ref 0.0–3.0)
Eosinophils Absolute: 0.1 10*3/uL (ref 0.0–0.7)
Eosinophils Relative: 1.5 % (ref 0.0–5.0)
HCT: 41.2 % (ref 39.0–52.0)
Hemoglobin: 13.7 g/dL (ref 13.0–17.0)
Lymphocytes Relative: 27.8 % (ref 12.0–46.0)
Lymphs Abs: 2 10*3/uL (ref 0.7–4.0)
MCHC: 33.4 g/dL (ref 30.0–36.0)
MCV: 80.4 fl (ref 78.0–100.0)
Monocytes Absolute: 0.3 10*3/uL (ref 0.1–1.0)
Monocytes Relative: 4.7 % (ref 3.0–12.0)
Neutro Abs: 4.7 10*3/uL (ref 1.4–7.7)
Neutrophils Relative %: 65.4 % (ref 43.0–77.0)
Platelets: 182 10*3/uL (ref 150.0–400.0)
RBC: 5.12 Mil/uL (ref 4.22–5.81)
RDW: 14 % (ref 11.5–15.5)
WBC: 7.2 10*3/uL (ref 4.0–10.5)

## 2020-12-07 LAB — COMPREHENSIVE METABOLIC PANEL WITH GFR
ALT: 19 U/L (ref 0–53)
AST: 13 U/L (ref 0–37)
Albumin: 4.6 g/dL (ref 3.5–5.2)
Alkaline Phosphatase: 72 U/L (ref 39–117)
BUN: 18 mg/dL (ref 6–23)
CO2: 26 meq/L (ref 19–32)
Calcium: 9.3 mg/dL (ref 8.4–10.5)
Chloride: 104 meq/L (ref 96–112)
Creatinine, Ser: 0.8 mg/dL (ref 0.40–1.50)
GFR: 111.34 mL/min
Glucose, Bld: 100 mg/dL — ABNORMAL HIGH (ref 70–99)
Potassium: 4.5 meq/L (ref 3.5–5.1)
Sodium: 138 meq/L (ref 135–145)
Total Bilirubin: 0.6 mg/dL (ref 0.2–1.2)
Total Protein: 7.1 g/dL (ref 6.0–8.3)

## 2020-12-07 LAB — MAGNESIUM: Magnesium: 2.1 mg/dL (ref 1.5–2.5)

## 2020-12-07 NOTE — Progress Notes (Signed)
Subjective:    Patient ID: Julian Washington, male    DOB: 1980/03/20, 40 y.o.   MRN: [id number]  DOS:  12/07/2020 Type of visit - description: Hospital follow-up   Admitted to the hospital discharged 2 days later on 12/04/2020: Initial symptoms: Syncope, palpitations. Found to be on A. fib with RVR,  EP consulted, CT of the coronaries showed calcium score of 0. He was placed on Eliquis, metoprolol dose adjusted, started oral flecainide.  Troponins were elevated, echo showed no wall abnormality, inc troponin  likely from demand ischemia.  Review of Systems Since he left the hospital he is doing well. No fever chills No chest pain or difficulty breathing Edema at baseline No palpitations. Holding ADHD medicines No unusual bleeds.     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 Drowsy.     Hydrocodone-acetaminophen    REACTION: itching   Latex    Oxycodone Itching   Shiitake Mushroom Rash        Medication List        Accurate as of December 07, 2020 11:59 PM. If you have any questions, ask your nurse or doctor.          Accu-Chek FastClix Lancets Misc 1 Package by Does not apply route 2 (two) times daily.   acetaminophen 500 MG tablet Commonly known as: TYLENOL Take 500-1,000 mg by mouth every 6 (six) hours as needed for moderate pain or headache.   albuterol 108 (90 Base) MCG/ACT inhaler Commonly known as: VENTOLIN HFA Inhale 2 puffs into the lungs every 4 (four) hours as needed for wheezing or shortness of breath.   atomoxetine 100 MG capsule Commonly known as: STRATTERA Take 100 mg by mouth daily.   clotrimazole-betamethasone cream Commonly known as: Lotrisone Apply 1 application topically 2 (two) times daily.   Eliquis 5 MG Tabs tablet Generic drug: apixaban Take 1 tablet (5 mg total) by mouth 2 (two) times daily.   fexofenadine 180 MG tablet Commonly known as: ALLEGRA Take 180 mg by mouth at bedtime.   flecainide 100 MG tablet  Commonly known as: TAMBOCOR Take 1 tablet (100 mg total) by mouth every 12 (twelve) hours.   fluticasone 50 MCG/ACT nasal spray Commonly known as: FLONASE Place 1 spray into both nostrils at bedtime.   furosemide 20 MG tablet Commonly known as: LASIX Take 1 tablet (20 mg total) by mouth daily. What changed:  when to take this reasons to take this   glucose blood test strip Commonly known as: Accu-Chek Guide 1 each by Other route 2 (two) times daily. Use as instructed   MAG-OXIDE PO Take 400 mg by mouth at bedtime.   metFORMIN 500 MG 24 hr tablet Commonly known as: GLUCOPHAGE-XR Take 500 mg by mouth 2 (two) times daily with a meal.   methylphenidate 18 MG CR tablet Commonly known as: CONCERTA Take 18 mg by mouth daily.   metoprolol succinate 50 MG 24 hr tablet Commonly known as: TOPROL-XL Take 1  tablet (50 mg total) by mouth 2 (two) times daily. Take with or immediately following a meal.   pantoprazole 40 MG tablet Commonly known as: PROTONIX Take 1 tablet (40 mg total) by mouth daily. What changed: when to take this   Skyrizi (150 MG Dose) 75 MG/0.83ML Pskt Generic drug: Risankizumab-rzaa(150 MG Dose) Inject 150 mg into the skin See admin instructions. Every 3 months           Objective:   Physical Exam BP 126/78 (BP Location: Left Arm, Patient Position: Sitting, Cuff Size: Normal)   Pulse 71   Temp 98.1 F (36.7 C) (Oral)   Resp 18   Ht 5\' 11"  (1.803 m)   Wt (!) 368 lb 2 oz (167 kg)   SpO2 98%   BMI 51.34 kg/m  General:   Well developed, NAD, BMI noted. HEENT:  Normocephalic . Face symmetric, atraumatic Lungs:  CTA B Normal respiratory effort, no intercostal retractions, no accessory muscle use. Heart: RRR,  no murmur.  Lower extremities: no pretibial edema bilaterally.  Chronic skin changes at baseline Skin: Not pale. Not jaundice Neurologic:  alert & oriented X3.  Speech normal, gait appropriate for age and unassisted Psych--  Cognition and judgment appear intact.  Cooperative with normal attention span and concentration.  Behavior appropriate. No anxious or depressed appearing.      Assessment      Assessment   Prediabetes-- on metformin x years  Chronic venous insufficiency/lymphedema/stasis dermatitis PSYCH: --Anxiety depression (suicidality w/  Citalopram) --ADHD Dx 2019, Dr Johnnye Sima Morbid obesity Vitamin D and  B12 deficiency CV: New onset A. fib 11-2020.  Admitted  Calcium coronary score 0 (11-2020) Endocrinology:Dr Gherghe --Pituitary mass, surgery : Prolactinoma --Hypogonadism hypogonadotropic --Infertility Migraines, d/c Topamax 05-2015 (pt concerned about short term memory)  OSA on CPAP -- DR Halford Chessman Hematology -Mild chronic anemia, no previous colonoscopy or EGD. No symptoms. Iron , vitamins normal.  saw hematology 09/2017   -Splenomegaly: Per CT 09/20/2017, saw hematology --Abdominal lymphadenopathies: Felt to be stable, see hematology note from July 14, 2019. Pulmonary nodules: Felt to be stable per last CT. DERM: Psoriasis  H/o ET  dysfunction, declined ear tubes H/o hemorrhoids s/p  banding H/o abd pain 2012 (w/u CT, [place], a HIDA scan), similar sx 02-2015 H/o  difficulty intubation COVID infex 06/2020  PLAN: New onset, atrial fibrillation, syncope Admitted to the hospital with above, see summary of admission on the HPI. Currently anticoagulating without evidence of bleeding Metoprolol dose was increased at the hospital,flecainide added. His apple watch shows sinus rhythm consistently. Plan: CMP, CBC, magnesium levels per  patient request, continue present meds, call for refills if needed.  Keep appointment with cardiology ADHD: Holding medications including Strattera and methylphenidate RTC 3 months CPX         This visit occurred during the SARS-CoV-2 public health emergency.  Safety protocols were in place, including screening questions prior to the visit, additional usage of staff PPE, and extensive cleaning of exam room while observing appropriate contact time as indicated for disinfecting solutions.

## 2020-12-07 NOTE — Patient Instructions (Addendum)
Check the  blood pressure at least once daily BP GOAL is between 110/65 and  135/85. If it is consistently higher or lower, let me know  Continue with your healthy diet with low-fat low-sodium   GO TO THE LAB : Get the blood work     [place], [place] back for a physical exam in 3 months

## 2020-12-08 ENCOUNTER — Telehealth: Payer: Self-pay

## 2020-12-08 NOTE — Telephone Encounter (Signed)
Transition Care Management Follow-up Telephone Call Date of discharge and from where: 12/04/20 from [facility] How have you been since you were released from the hospital? "Been doing good". Any questions or concerns? No  Items Reviewed: Did the pt receive and understand the discharge instructions provided? Yes  Medications obtained and verified? Yes  Other? No  Any new allergies since your discharge? No  Dietary orders reviewed? Yes Do you have support at home? Yes   Home Care and Equipment/Supplies: Were home health services ordered? not applicable If so, what is the name of the agency? Not applicable  Has the agency set up a time to come to the patient's home? not applicable Were any new equipment or medical supplies ordered?  No What is the name of the medical supply agency? Not applicable Were you able to get the supplies/equipment? not applicable Do you have any questions related to the use of the equipment or supplies? No  Functional Questionnaire: (I = Independent and D = Dependent) ADLs: I  Bathing/Dressing- I  Meal Prep- I  Eating- I  Maintaining continence- I  Transferring/Ambulation- I  Managing Meds- I  Follow up appointments reviewed:  PCP Hospital f/u appt confirmed? Yes  Scheduled to see Dr. Larose Kells on 10/27.  [facility] f/u appt confirmed? Yes  Scheduled to see cardiology, Laurann Montana, NP on 12/28/20 @ 9:15 am. Are transportation arrangements needed? No  If their condition worsens, is the pt aware to call PCP or go to the Emergency Dept.? Yes Was the patient provided with contact information for the PCP's office or ED? Yes Was to pt encouraged to call back with questions or concerns? Yes    Thea Silversmith, RN, MSN, BSN, [place] Care Management Coordinator [phone]

## 2020-12-08 NOTE — Telephone Encounter (Signed)
Transition Care Management Unsuccessful Follow-up Telephone Call  Date of discharge and from where:  12/04/2020  [facility] Attempts:  1st Attempt  Reason for unsuccessful TCM follow-up call:  No answer/busy  Tomasa Rand, RN, BSN, CEN [place] Coordinator [phone]

## 2020-12-08 NOTE — Assessment & Plan Note (Signed)
New onset, atrial fibrillation, syncope Admitted to the hospital with above, see summary of admission on the HPI. Currently anticoagulating without evidence of bleeding Metoprolol dose was increased at the hospital,flecainide added. His apple watch shows sinus rhythm consistently. Plan: CMP, CBC, magnesium levels per patient request, continue present meds, call for refills if needed.  Keep appointment with cardiology ADHD: Holding medications including Strattera and methylphenidate RTC 3 months CPX

## 2020-12-11 ENCOUNTER — Telehealth (HOSPITAL_COMMUNITY): Payer: Self-pay | Admitting: Pharmacist

## 2020-12-11 ENCOUNTER — Other Ambulatory Visit (HOSPITAL_COMMUNITY): Payer: Self-pay

## 2020-12-11 NOTE — Telephone Encounter (Signed)
Pharmacy Transitions of Care Follow-up Telephone Call  Date of discharge: 12/04/2020   How have you been since you were released from the hospital? Good   Medication changes made at discharge:  - START: eliquis, metoprolol xl, flecainide  - STOPPED: ibuprofen and metoprolol tartrate  - CHANGED:   Medication changes verified by the patient? Yes (Yes/No)    Medication Accessibility:  Home Pharmacy: [facility]  Was the patient provided with refills on discharged medications? Yes   Have all prescriptions been transferred from [facility] to home pharmacy? Wants to see cardiologist first.    Medication Review:    APIXABAN (ELIQUIS)  Apixaban 10 mg BID initiated on 12/04/2020. Will switch to apixaban 5 mg BID after 7 days - Discussed importance of taking medication around the same time everyday  - Reviewed potential DDIs with patient  - Advised patient of medications to avoid (NSAIDs, ASA)  - Educated that Tylenol (acetaminophen) will be the preferred analgesic to prevent risk of bleeding  - Emphasized importance of monitoring for signs and symptoms of bleeding (abnormal bruising, prolonged bleeding, nose bleeds, bleeding from gums, discolored urine, black tarry stools)  - Advised patient to alert all providers of anticoagulation therapy prior to starting a new medication or having a procedure    Follow-up Appointments:  PCP Hospital f/u appt confirmed? Yes   Specialist Hospital f/u appt confirmed? Yes   If their condition worsens, is the pt aware to call PCP or go to the Emergency Dept.? Yes  Final Patient Assessment: Patient reports he is dong well.  Reviewed medications.

## 2020-12-25 ENCOUNTER — Other Ambulatory Visit: Payer: Self-pay

## 2020-12-25 ENCOUNTER — Ambulatory Visit (INDEPENDENT_AMBULATORY_CARE_PROVIDER_SITE_OTHER): Payer: Managed Care, Other (non HMO)

## 2020-12-25 ENCOUNTER — Ambulatory Visit: Payer: Managed Care, Other (non HMO) | Admitting: Podiatry

## 2020-12-25 DIAGNOSIS — L97522 Non-pressure chronic ulcer of other part of left foot with fat layer exposed: Secondary | ICD-10-CM

## 2020-12-25 DIAGNOSIS — E08621 Diabetes mellitus due to underlying condition with foot ulcer: Secondary | ICD-10-CM | POA: Diagnosis not present

## 2020-12-25 MED ORDER — KETOCONAZOLE 2 % EX CREA: 1.0000 "application " | TOPICAL_CREAM | Freq: Every day | CUTANEOUS | 1 refills | Status: DC

## 2020-12-25 MED ORDER — GENTAMICIN SULFATE 0.1 % EX CREA: 1.0000 "application " | TOPICAL_CREAM | Freq: Two times a day (BID) | CUTANEOUS | 1 refills | Status: DC

## 2020-12-25 NOTE — Progress Notes (Signed)
   Subjective:  40 y.o. male with PMHx of diabetes mellitus presenting today for evaluation of a small laceration the patient noticed at the base of the third toe left foot.  Patient believes it may have been from the bowling shoes.  Originally the patient thought that perhaps it was athlete's foot but he presents today for further treatment evaluation.  He has been spraying antifungal foot spray on the foot.   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  Dermatology:  Wound #1 noted to the base of the left third toe measuring approximately 0.8 x 0.2 x 0.1 cm (LxWxD).  Please see above photo  To the noted ulceration(s), there is no eschar. There is a moderate amount of slough, fibrin, and necrotic tissue noted. Granulation tissue and wound base is red. There is a minimal amount of serosanguineous drainage noted. There is no exposed bone muscle-tendon ligament or joint. There is no malodor. Periwound integrity is intact. Skin is warm, dry and supple bilateral lower extremities.  Vascular: Palpable pedal pulses bilaterally. No edema or erythema noted.  Capillary refill within normal limits.  Neurological: Epicritic and protective threshold diminished bilaterally.   Musculoskeletal Exam: No pedal deformities noted  Assessment: 1.  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 diabetes mellitus w/ peripheral neuropathy   Plan of Care:  1. Patient was evaluated. 2.  Prescription for ketoconazole 2% cream and gentamicin 2% cream to combine a 50-50 mixture and apply 2 times daily 3.  Recommend good supportive shoes and sneakers that do not allow a lot of flexibility to the toes 4.  Return to clinic in 2 weeks   Edrick Kins, DPM [facility]  Dr. Edrick Kins, DPM    [address], [place] [address]                Office [phone]  Fax [phone]

## 2020-12-27 NOTE — Progress Notes (Addendum)
Office Visit    Patient Name: Julian Washington Date of Encounter: 12/28/2020  PCP:  Colon Branch, [place]  Cardiologist:  Vickie Epley, MD  Advanced Practice Provider:  No care team member to display Electrophysiologist:  None    Chief Complaint    BONNY EGGER is a 40 y.o. male with a hx of OSA on CPAP, Type 2 DM, HTN, Afib with RVR, and Syncope presents today for a  post-hospital visit.    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 Drowsy.    Hydrocodone-Acetaminophen     REACTION: itching   Latex    Oxycodone Itching   Shiitake Mushroom Rash    History of Present Illness    Julian Washington is a 40 y.o. male with a hx of hx of OSA on CPAP, Type 2 DM, HTN, Afib with RVR, and Syncope presents today for a  post-hospital visit  last seen in the hospital by Almyra Deforest, PA for elevated troponin and afib with RVR. At that time we reordered his BB as metoprolol succinate 50mg  BID and continued his short acting diltiazem. His mildly elevated troponin was thought to be due to demand ischemia.  Dr. Quentin Ore recommend flecainide 100mg  BID if coronary CT is stable. Coronary CT showed calcium score of 0 therefore he was started on Flecainide. He is following up form his hospital visit.   He was seen by his PCP on 10/27 and was doing well at that time. He was not having any chest pain or difficulty breathing. His edema was at baseline with no palpitations. He was holding his ADHD medications at that time due to his atrial fibrillation with RVR. He did not have any unusual bleeding with initiation of Apixaban. At the time of his appointment labs were ordered and showed a potassium of 4.5, recent magnesium was 2.2, kidney function was stable. He was not anemic.   Today, he has been doing well without any new issues. He does notice some dizziness when bending  down to pick something up too quickly and change in position. When he was dizzy he took his BP which was 150/108 at that time but otherwise he does not take his BP regularly. He has not noticed any fluttering or palpitations in his chest. He does have a watch the EKG capabilities and has not noticed being in A. Fib since he was in the hospital. His sleep apnea has been well controlled on CPAP. His venous stasis is at baseline. We discussed getting back to taking his blood glucose level at home even though his A1C was well controlled at 6.2. Labs drawn in the hospital all look okay. He remains on magnesium supplementation. We discussed the fact that his heparin level in the hospital was never therapeutic and now that he is on daily Eliquis he is worried that it is not enough for him. I would defer him to Pharmacy for this question. He does have some dyspnea on exertion but has been walking around  with his puppy in the back yard and occasionally bowls without SOB. He has not been able to get back up to 1.5 miles a day with his puppy since being in the hospital. He is going to work on slowly increasing his activity level. We discussed diet and continuing a low-sodium diet and making heart healthy choices.   Reports no chest pain, pressure, or tightness. No edema, orthopnea, PND. Reports no palpitations.     EKGs/Labs/Other Studies Reviewed:   The following studies were reviewed today:  Echo 12/03/2020   1. Left ventricular ejection fraction, by estimation, is 60 to 65%. The  left ventricle has normal function. The left ventricle has no regional  wall motion abnormalities. There is mild concentric left ventricular  hypertrophy. Left ventricular diastolic  parameters were normal.   2. Right ventricular systolic function is normal. The right ventricular  size is normal.   3. The mitral valve is grossly normal. No evidence of mitral valve  regurgitation. No evidence of mitral stenosis.   4. The aortic valve was not well visualized. Aortic valve regurgitation  is not visualized. No aortic stenosis is present.   Comparison(s): No significant change from prior study.  EKG:  EKG is ordered today.  The ekg ordered today demonstrates NSR rate is 61. Qtc 410  Recent Labs: 12/02/2020: TSH 3.457 12/07/2020: ALT 19; BUN 18; Creatinine, Ser 0.80; Hemoglobin 13.7; Magnesium 2.1; Platelets 182.0; Potassium 4.5; Sodium 138  Recent Lipid Panel    Component Value Date/Time   CHOL 127 01/26/2020 1135   CHOL 122 11/20/2016 0811   TRIG 120.0 01/26/2020 1135   HDL 38.60 (L) 01/26/2020 1135   HDL 33 (L) 11/20/2016 0811   CHOLHDL 3 01/26/2020 1135   VLDL 24.0 01/26/2020 1135   LDLCALC 64 01/26/2020 1135   LDLCALC 65 11/20/2016 0811    Risk Assessment/Calculations:   CHA2DS2-VASc Score = 2   This indicates a 2.2% annual risk of stroke. The patient's score is based upon: CHF History:  0 HTN History: 1 Diabetes History: 1 Stroke History: 0 Vascular Disease History: 0 Age Score: 0 Gender Score: 0    Home Medications   Current Meds  Medication Sig   ACCU-CHEK FASTCLIX LANCETS MISC 1 Package by Does not apply route 2 (two) times daily.   acetaminophen (TYLENOL) 500 MG tablet Take 500-1,000 mg by mouth every 6 (six) hours as needed for moderate pain or headache.   atomoxetine (STRATTERA) 100 MG capsule Take 100 mg by mouth daily.  fexofenadine (ALLEGRA) 180 MG tablet Take 180 mg by mouth at bedtime.   fluticasone (FLONASE) 50 MCG/ACT nasal spray Place 1 spray into both nostrils at bedtime.   gentamicin cream (GARAMYCIN) 0.1 % Apply 1 application topically 2 (two) times daily.   glucose blood (ACCU-CHEK GUIDE) test strip 1 each by Other route 2 (two) times daily. Use as instructed   ketoconazole (NIZORAL) 2 % cream Apply 1 application topically daily.   Magnesium Oxide (MAG-OXIDE PO) Take 400 mg by mouth at bedtime.   metFORMIN (GLUCOPHAGE-XR) 500 MG 24 hr tablet Take 1,000 mg by mouth 2 (two) times daily with a meal.   pantoprazole (PROTONIX) 40 MG tablet Take 1 tablet (40 mg total) by mouth daily. (Patient taking differently: Take 40 mg by mouth at bedtime.)   SKYRIZI, 150 MG DOSE, 75 MG/0.83ML PSKT Inject 150 mg into the skin See admin instructions. Every 3 months   [DISCONTINUED] apixaban (ELIQUIS) 5 MG TABS tablet Take 1 tablet (5 mg total) by mouth 2 (two) times daily.   [DISCONTINUED] flecainide (TAMBOCOR) 100 MG tablet Take 1 tablet (100 mg total) by mouth every 12 (twelve) hours.   [DISCONTINUED] furosemide (LASIX) 20 MG tablet Take 20 mg by mouth daily as needed.   [DISCONTINUED] metoprolol succinate (TOPROL-XL) 50 MG 24 hr tablet Take 1 tablet (50 mg total) by mouth 2 (two) times daily. Take with or immediately following a meal.     Review of Systems      All other systems reviewed and are otherwise negative except as noted above.  Physical Exam    VS:   BP (!) 146/84   Pulse 61   Ht 5\' 11"  (1.803 m)   Wt (!) 367 lb (166.5 kg)   BMI 51.19 kg/m  , BMI Body mass index is 51.19 kg/m.  Wt Readings from Last 3 Encounters:  12/28/20 (!) 367 lb (166.5 kg)  12/07/20 (!) 368 lb 2 oz (167 kg)  12/03/20 (!) 369 lb 9.6 oz (167.6 kg)     GEN: Well nourished, well developed, in no acute distress. HEENT: normal. Cardiac: RRR, no murmurs, rubs, or gallops. No clubbing, cyanosis, edema.  Radials/PT 2+ and equal bilaterally.  Respiratory:  Respirations regular and unlabored, clear to auscultation bilaterally. GI: Soft, nontender, nondistended. MS: No deformity or atrophy. Skin: Warm and dry, no rash. Neuro:  Strength and sensation are intact. Psych: Normal affect.  Assessment & Plan    Atrial fibrillation with RVR-started on Flecainide while in the hospital. He is also taking Metoprolol succinate 50mg  BID.  Labs drawn in the hospital show potassium 4.5 and Magnesium 2.2. Holding Strattera and Methylphenidate for his ADHD. His apple watch has suggested that he has been in NSR. Recent Flecainide initiation discussed with Dr. Quentin Ore. Low suspicion that he will be able to complete ETT. As such. Plan for EKG only today/plan discussed with Dr. Quentin Ore. EKG today represents NSR.   Hypertension-BP well controlled. Continue current antihypertensive regimen.  Continues to monitor at home and continues to monitor his salt intake. 146/84, 150/108 when he was dizzy. I encouraged him to day daily BP checks.   Elevated Troponin while in the hospital-peak at 337. Likely demand ischemia. Calcium score 0. No further ischemic testing needed at this time.   Chronic venous insufficiency-Dr. Scot Dock followed him back in 2019. He stated he could come back as needed. Compression stockings and elevation recommended. No signs of claudication.   OSA-on CPAP and looked after by Dr. Halford Chessman  Type 2  diabetes- A1C 6.2, continue current medication regimen. He has not been checking.    Morbid obesity- BMI 51.34, diet and exercise recommendations have been provided.   Disposition: Follow up in 2 month(s) with Vickie Epley, MD or APP.  Signed, Elgie Collard, PA-C 12/28/2020, 10:25 AM [place]

## 2020-12-28 ENCOUNTER — Other Ambulatory Visit: Payer: Self-pay

## 2020-12-28 ENCOUNTER — Encounter (HOSPITAL_BASED_OUTPATIENT_CLINIC_OR_DEPARTMENT_OTHER): Payer: Self-pay | Admitting: Physician Assistant

## 2020-12-28 ENCOUNTER — Ambulatory Visit (HOSPITAL_BASED_OUTPATIENT_CLINIC_OR_DEPARTMENT_OTHER): Payer: Managed Care, Other (non HMO) | Admitting: Physician Assistant

## 2020-12-28 DIAGNOSIS — I872 Venous insufficiency (chronic) (peripheral): Secondary | ICD-10-CM

## 2020-12-28 DIAGNOSIS — E119 Type 2 diabetes mellitus without complications: Secondary | ICD-10-CM

## 2020-12-28 DIAGNOSIS — G4733 Obstructive sleep apnea (adult) (pediatric): Secondary | ICD-10-CM | POA: Diagnosis not present

## 2020-12-28 DIAGNOSIS — I4891 Unspecified atrial fibrillation: Secondary | ICD-10-CM | POA: Diagnosis not present

## 2020-12-28 DIAGNOSIS — Z9989 Dependence on other enabling machines and devices: Secondary | ICD-10-CM

## 2020-12-28 DIAGNOSIS — I10 Essential (primary) hypertension: Secondary | ICD-10-CM

## 2020-12-28 MED ORDER — APIXABAN 5 MG PO TABS: 5.0000 mg | ORAL_TABLET | Freq: Two times a day (BID) | ORAL | 0 refills | Status: DC

## 2020-12-28 MED ORDER — METOPROLOL SUCCINATE ER 50 MG PO TB24: 50.0000 mg | ORAL_TABLET | Freq: Two times a day (BID) | ORAL | 3 refills | Status: DC

## 2020-12-28 MED ORDER — FLECAINIDE ACETATE 100 MG PO TABS: 100.0000 mg | ORAL_TABLET | Freq: Two times a day (BID) | ORAL | 0 refills | Status: DC

## 2020-12-28 MED ORDER — FLECAINIDE ACETATE 100 MG PO TABS: 100.0000 mg | ORAL_TABLET | Freq: Two times a day (BID) | ORAL | 3 refills | Status: DC

## 2020-12-28 MED ORDER — METOPROLOL SUCCINATE ER 50 MG PO TB24: 50.0000 mg | ORAL_TABLET | Freq: Two times a day (BID) | ORAL | 0 refills | Status: DC

## 2020-12-28 MED ORDER — APIXABAN 5 MG PO TABS: 5.0000 mg | ORAL_TABLET | Freq: Two times a day (BID) | ORAL | 3 refills | Status: DC

## 2020-12-28 MED ORDER — FUROSEMIDE 20 MG PO TABS: 20.0000 mg | ORAL_TABLET | Freq: Every day | ORAL | 2 refills | Status: DC | PRN

## 2020-12-28 NOTE — Patient Instructions (Signed)
Medication Instructions:  Continue your current medications.   *If you need a refill on your cardiac medications before your next appointment, please call your pharmacy*  Lab Work: None ordered today.   Testing/Procedures: Your EKG today showed normal sinus rhythm.   Follow-Up: At [facility], you and your health needs are our priority.  As part of our continuing mission to provide you with exceptional heart care, we have created designated Provider Care Teams.  These Care Teams include your primary Cardiologist (physician) and Advanced Practice Providers (APPs -  Physician Assistants and Nurse Practitioners) who all work together to provide you with the care you need, when you need it.  We recommend signing up for the patient portal called "MyChart".  Sign up information is provided on this After Visit Summary.  MyChart is used to connect with patients for Virtual Visits (Telemedicine).  Patients are able to view lab/test results, encounter notes, upcoming appointments, etc.  Non-urgent messages can be sent to your provider as well.   To learn more about what you can do with MyChart, go to [email or web address].    Your next appointment:   With Dr. Quentin Ore - one of the EP schedulers will reach out to you to schedule this appointment.   Other Instructions  We will ask our pharmacist to reach out to you to discuss your Eliquis efficacy and any concerns with heparin levels during recent hospitalization.   To prevent or reduce lower extremity swelling: Eat a low salt diet. Salt makes the body hold onto extra fluid which causes swelling. Sit with legs elevated. For example, in the recliner or on an [place].  Wear knee-high compression stockings during the daytime. Ones labeled 15-20 mmHg provide good compression.   Heart Healthy Diet Recommendations: A low-salt diet is recommended. Meats should be grilled, baked, or boiled. Avoid fried foods. Focus on lean protein sources like fish or  chicken with vegetables and fruits. The American Heart Association is a Microbiologist!  American Heart Association Diet and Lifeystyle Recommendations    Exercise recommendations: The American Heart Association recommends 150 minutes of moderate intensity exercise weekly. Try 30 minutes of moderate intensity exercise 4-5 times per week. This could include walking, jogging, or swimming.

## 2021-01-03 NOTE — Telephone Encounter (Signed)
Please advise 

## 2021-01-08 ENCOUNTER — Ambulatory Visit: Payer: Managed Care, Other (non HMO) | Admitting: Podiatry

## 2021-01-10 ENCOUNTER — Ambulatory Visit: Payer: Managed Care, Other (non HMO) | Admitting: Podiatry

## 2021-01-29 ENCOUNTER — Encounter: Payer: Managed Care, Other (non HMO) | Admitting: Internal Medicine

## 2021-03-06 LAB — HM DIABETES EYE EXAM

## 2021-03-07 ENCOUNTER — Encounter: Payer: Self-pay | Admitting: Internal Medicine

## 2021-03-08 NOTE — Progress Notes (Signed)
Electrophysiology Office Follow up Visit Note:    Date:  03/09/2021   ID:  Julian Washington, DOB October 29, 1980, MRN [id number]  PCP:  Colon Branch, MD  [facility] Cardiologist:  Vickie Epley, MD  [facility] Electrophysiologist:  None    Interval History:    Julian Washington is a 41 y.o. male who presents for a follow up visit.  I met him when he was hospitalized in October 2022 for atrial fibrillation with RVR.  He was started on flecainide 100 mg by mouth twice daily in addition to Toprol-XL 25 mg by mouth twice daily.  He is seeing Nicholes Rough in follow-up on December 28, 2020.  At that visit he reported no recurrence of atrial fibrillation. Today he tells me that he is doing well.      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   acetaminophen (TYLENOL) 500 MG tablet Take 500-1,000 mg by mouth every 6 (six) hours as needed for moderate pain or headache.    apixaban (ELIQUIS) 5 MG TABS tablet Take 1 tablet (5 mg total) by mouth 2 (two) times daily.   atomoxetine (STRATTERA) 100 MG capsule Take 100 mg by mouth daily.   fexofenadine (ALLEGRA) 180 MG tablet Take 180 mg by mouth at bedtime.   flecainide (TAMBOCOR) 100 MG tablet Take 1 tablet (100 mg total) by mouth every 12 (twelve) hours.   fluticasone (FLONASE) 50 MCG/ACT nasal spray Place 1 spray into both nostrils at bedtime.   furosemide (LASIX) 20 MG tablet Take 1 tablet (20 mg total) by mouth daily as needed.   gentamicin cream (GARAMYCIN) 0.1 % Apply 1 application topically 2 (two) times daily.   glucose blood (ACCU-CHEK GUIDE) test strip 1 each by Other route 2 (two) times daily. Use as instructed   ketoconazole (NIZORAL) 2 % cream Apply 1 application topically daily.   Magnesium Oxide (MAG-OXIDE PO) Take 400 mg by mouth at bedtime.   metFORMIN (GLUCOPHAGE-XR) 500 MG 24 hr tablet Take 1,000 mg by mouth 2 (two) times daily with a meal.   metoprolol succinate (TOPROL-XL) 50 MG 24 hr tablet Take 1 tablet (50 mg total) by mouth 2 (  two) times daily. Take with or immediately following a meal.   pantoprazole (PROTONIX) 40 MG tablet Take 1 tablet (40 mg total) by mouth daily. (Patient taking differently: Take 40 mg by mouth at bedtime.)   SKYRIZI, 150 MG DOSE, 75 MG/0.83ML PSKT Inject 150 mg into the skin See admin instructions. 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  Tobacco Use   Smoking status: Never   Smokeless tobacco: Never  Vaping Use    Vaping Use: Never used  Substance and Sexual Activity   Alcohol use: Not Currently    Comment: none x 10 yrs   Drug use: No   Sexual activity: Not on file  Other Topics Concern   Not on file  Social History Narrative   Lives w/ wife   Social Determinants of Health   Financial Resource Strain: Not on file  Food Insecurity: Not on file  Transportation Needs: Not on file  Physical Activity: Not on file  Stress: Not on file  Social Connections: Not on file     Family History: The patient's family history includes Anxiety disorder in his father; Bipolar disorder in his father; CAD in an other family member; Cancer in his maternal grandfather and paternal grandfather; Depression in his father; Diabetes in his paternal grandfather and paternal grandmother; Drug abuse in his father; Hypertension in his paternal grandmother; Obesity in his mother; Prostate cancer in an other family member. There is no history of Colon cancer.  ROS:   Please see the history of present illness.    All other systems reviewed and are negative.  EKGs/Labs/Other Studies Reviewed:    The following studies were reviewed today:  12/03/2020 Echo  1. Left ventricular ejection fraction, by estimation, is 60 to 65%. The  left ventricle has normal function. The left ventricle has no regional  wall motion abnormalities. There is mild concentric left ventricular  hypertrophy. Left ventricular diastolic  parameters were normal.   2. Right ventricular systolic function is normal. The right ventricular  size is normal.   3. The mitral valve is grossly normal. No evidence of mitral valve  regurgitation. No evidence of mitral stenosis.   4. The aortic valve was not well visualized. Aortic valve regurgitation  is not visualized. No aortic stenosis is present.   EKG:  The ekg ordered today demonstrates sinus rhythm, incomplete right bundle branch block.  PR interval normal.  QRS duration 105 ms.  Recent  Labs: 12/02/2020: TSH 3.457 12/07/2020: ALT 19; BUN 18; Creatinine, Ser 0.80; Hemoglobin 13.7; Magnesium 2.1; Platelets 182.0; Potassium 4.5; Sodium 138  Recent Lipid Panel    Component Value Date/Time   CHOL 127 01/26/2020 1135   CHOL 122 11/20/2016 0811   TRIG 120.0 01/26/2020 1135   HDL 38.60 (L) 01/26/2020 1135   HDL 33 (L) 11/20/2016 0811   CHOLHDL 3 01/26/2020 1135   VLDL 24.0 01/26/2020 1135   LDLCALC 64 01/26/2020 1135   LDLCALC 65 11/20/2016 0811    Physical Exam:    VS:  BP 138/82    Pulse 70    Ht '5\' 11"'  (1.803 m)    Wt (!) 377 lb 6.4 oz (171.2 kg)    SpO2 95%    BMI 52.64 kg/m     Wt Readings from Last 3 Encounters:  03/09/21 (!) 377 lb 6.4 oz (171.2 kg)  12/28/20 (!) 367 lb (166.5 kg)  12/07/20 (!) 368 lb 2 oz (167 kg)     GEN:  Well nourished, well developed in no acute distress.  Obese HEENT: Normal NECK: No JVD; No carotid bruits LYMPHATICS: No lymphadenopathy CARDIAC: RRR, no murmurs, rubs, gallops RESPIRATORY:  Clear to auscultation without rales, wheezing or rhonchi  ABDOMEN: Soft, non-tender, non-distended MUSCULOSKELETAL:  No edema; No deformity  SKIN: Warm and dry NEUROLOGIC:  Alert and oriented x 3 PSYCHIATRIC:  Normal affect        ASSESSMENT:    1. Atrial fibrillation with RVR ([place])   2. Morbid obesity ([place])   3. Type 2 diabetes mellitus without complication, without long-term current use of insulin ([place])   4. Encounter for long-term (current) use of high-risk medication    PLAN:    In order of problems listed above:  #Persistent atrial fibrillation Maintaining normal rhythm on flecainide Not an ablation candidate because of BMI Continue flecainide, metop and apixaban Follow-up with APP in 6 months and me in 1 year  CHA2DS2-VASc Score = 2  The patient's score is based upon: CHF History: 0 HTN History: 1 Diabetes History: 1 Stroke History: 0 Vascular Disease History: 0 Age Score: 0 Gender Score: 0   { #Obesity Weight loss  encouraged  #Diabetes Follow-up with primary care 6 months with APP for antiarrhythmic follow-up     Signed,  Vickie Epley, MD    03/09/2021 8:41 AM       Medication Adjustments/Labs and Tests Ordered: Current medicines are reviewed at length with the patient today.  Concerns regarding medicines are outlined above.  Orders Placed This Encounter  Procedures   EKG 12-Lead   No orders of the defined types were placed in this encounter.    Signed, Lars Mage, MD, [facility], [facility] 03/09/2021 8:41 AM    Electrophysiology [facility]

## 2021-03-09 ENCOUNTER — Encounter: Payer: Self-pay | Admitting: Cardiology

## 2021-03-09 ENCOUNTER — Ambulatory Visit: Payer: 59 | Admitting: Cardiology

## 2021-03-09 ENCOUNTER — Other Ambulatory Visit: Payer: Self-pay

## 2021-03-09 VITALS — BP 138/82 | HR 70 | Ht 71.0 in | Wt 377.4 lb

## 2021-03-09 DIAGNOSIS — I4891 Unspecified atrial fibrillation: Secondary | ICD-10-CM

## 2021-03-09 DIAGNOSIS — E119 Type 2 diabetes mellitus without complications: Secondary | ICD-10-CM | POA: Diagnosis not present

## 2021-03-09 DIAGNOSIS — Z79899 Other long term (current) drug therapy: Secondary | ICD-10-CM | POA: Diagnosis not present

## 2021-03-09 NOTE — Patient Instructions (Addendum)
Medication Instructions:  Your physician recommends that you continue on your current medications as directed. Please refer to the Current Medication list given to you today. *If you need a refill on your cardiac medications before your next appointment, please call your pharmacy*  Lab Work: None. If you have labs (blood work) drawn today and your tests are completely normal, you will receive your results only by: [place] (if you have MyChart) OR A paper copy in the mail If you have any lab test that is abnormal or we need to change your treatment, we will call you to review the results.  Testing/Procedures: None.  Follow-Up: At [facility], you and your health needs are our priority.  As part of our continuing mission to provide you with exceptional heart care, we have created designated Provider Care Teams.  These Care Teams include your primary Cardiologist (physician) and Advanced Practice Providers (APPs -  Physician Assistants and Nurse Practitioners) who all work together to provide you with the care you need, when you need it.  Your physician wants you to follow-up in: 09/07/21 at 3:45 pm one of the following Advanced Practice Providers on your designated Care Team:    Tommye Standard, [place]   We recommend signing up for the patient portal called "MyChart".  Sign up information is provided on this After Visit Summary.  MyChart is used to connect with patients for Virtual Visits (Telemedicine).  Patients are able to view lab/test results, encounter notes, upcoming appointments, etc.  Non-urgent messages can be sent to your provider as well.   To learn more about what you can do with MyChart, go to [email or web address].    Any Other Special Instructions Will Be Listed Below (If Applicable).

## 2021-03-11 ENCOUNTER — Encounter: Payer: Self-pay | Admitting: Cardiology

## 2021-03-22 ENCOUNTER — Other Ambulatory Visit (HOSPITAL_COMMUNITY): Payer: Self-pay

## 2021-04-02 ENCOUNTER — Telehealth: Payer: Self-pay

## 2021-04-02 ENCOUNTER — Ambulatory Visit (INDEPENDENT_AMBULATORY_CARE_PROVIDER_SITE_OTHER): Payer: 59 | Admitting: *Deleted

## 2021-04-02 DIAGNOSIS — Z23 Encounter for immunization: Secondary | ICD-10-CM

## 2021-04-02 NOTE — Telephone Encounter (Signed)
Agree. Also advised patient to, we need to have a low threshold to start antibiotics, if he notice any redness, swelling, pain call the office immediately

## 2021-04-02 NOTE — Progress Notes (Signed)
Julian Washington is a 41 y.o. male presents to the office today for a Tdap vaccine.  Vaccine given in right deltoid. Patient tolerated injection well.   Ninfa Meeker , CMA

## 2021-04-02 NOTE — Telephone Encounter (Signed)
Pt aware.

## 2021-04-02 NOTE — Telephone Encounter (Signed)
Pt called today-his dog bit him over weekend (dog is up to date on its immunizations). Pt requesting TDAP (last 2015), I have scheduled him for a nurse visit.

## 2021-04-19 ENCOUNTER — Telehealth: Payer: Self-pay | Admitting: Cardiology

## 2021-04-19 NOTE — Telephone Encounter (Signed)
Ronalee Belts RN with [place] [facility] ?Calling requesting to speak with Dr. Claudie Revering nurse to discuss  pt's ADHD symptoms is now affecting home life. Also discuss pt's recent visit with PCP ?

## 2021-04-20 ENCOUNTER — Telehealth: Payer: Self-pay | Admitting: *Deleted

## 2021-04-20 NOTE — Telephone Encounter (Signed)
mike at [place] [facility] returning a phone call ?

## 2021-04-25 ENCOUNTER — Encounter: Payer: Self-pay | Admitting: Internal Medicine

## 2021-04-26 NOTE — Telephone Encounter (Signed)
See other phone note

## 2021-05-04 ENCOUNTER — Other Ambulatory Visit: Payer: Self-pay | Admitting: Internal Medicine

## 2021-05-07 ENCOUNTER — Other Ambulatory Visit: Payer: Self-pay | Admitting: Internal Medicine

## 2021-05-07 MED ORDER — METFORMIN HCL ER 500 MG PO TB24: 1000.0000 mg | ORAL_TABLET | Freq: Two times a day (BID) | ORAL | 0 refills | Status: DC

## 2021-05-07 NOTE — Telephone Encounter (Signed)
Left 3 messages to call back after initial call. Closing encounter at this time.  ?

## 2021-05-10 ENCOUNTER — Telehealth: Payer: Self-pay | Admitting: Cardiology

## 2021-05-10 NOTE — Telephone Encounter (Signed)
Patient c/o Palpitations:  High priority if patient c/o lightheadedness, shortness of breath, or chest pain ? ?How long have you had palpitations/irregular HR/ Afib? Are you having the symptoms now?  ?Palpitations the past few days, but no symptoms currently and not in afib ? ?Are you currently experiencing lightheadedness, SOB or CP?  ?Not currently, but patient has been dizzy the past few days, but not currently. ?Denies SOB/CP ? ?Do you have a history of afib (atrial fibrillation) or irregular heart rhythm?  ?Hx of afib  ? ?Have you checked your BP or HR? (document readings if available):  ?HR has been high 50's-low 60's, hasn't checked BP  ? ?Are you experiencing any other symptoms?  ?No  ? ?

## 2021-05-10 NOTE — Telephone Encounter (Signed)
The patient started noticing palpitations on 05/08/21. He started weight watchers and is on his second week of that with a 10 lb weight loss with the diet change. He had some dizziness last night and this morning. He was walking around the kitchen getting ready to eat breakfast, had already took the dog out and felt fine before the episode. Dizziness lasted ~ 30 seconds. Patient feels like he is hydrating enough with the new diet. When he has felt palpitations he used smart watch and heart rate was 50- 60's, states he normally runs in the 70's. The patient has not been checking BP and does not have cuff to do so at this time. The patient missed Flecainide and Toprol- xl and Eliquis morning doses Sunday morning (3/26) notes his heart rate did not change or increase that day and has not missed any since then. Noted the patient has not checked blood sugar on metformin since diet change but he doesn't feel like it has been low. Advised patient to check CBG as soon as possible and daily going forward, stressing the importance of this with diet changes. Talked about the importance of not missing medications in the future. Advised to check BP as well as HR a few hours after medications each day. Gave ED precautions. Will forward to Dr. Quentin Ore for advisement.  ?

## 2021-05-21 ENCOUNTER — Ambulatory Visit (INDEPENDENT_AMBULATORY_CARE_PROVIDER_SITE_OTHER): Payer: 59 | Admitting: Internal Medicine

## 2021-05-21 ENCOUNTER — Encounter: Payer: Self-pay | Admitting: Internal Medicine

## 2021-05-21 VITALS — BP 162/98 | HR 67 | Temp 98.0°F | Resp 18 | Ht 71.0 in | Wt 370.0 lb

## 2021-05-21 DIAGNOSIS — Z0000 Encounter for general adult medical examination without abnormal findings: Secondary | ICD-10-CM | POA: Diagnosis not present

## 2021-05-21 DIAGNOSIS — I4891 Unspecified atrial fibrillation: Secondary | ICD-10-CM | POA: Diagnosis not present

## 2021-05-21 DIAGNOSIS — E119 Type 2 diabetes mellitus without complications: Secondary | ICD-10-CM | POA: Diagnosis not present

## 2021-05-21 DIAGNOSIS — D352 Benign neoplasm of pituitary gland: Secondary | ICD-10-CM

## 2021-05-21 DIAGNOSIS — E230 Hypopituitarism: Secondary | ICD-10-CM

## 2021-05-21 MED ORDER — OMEPRAZOLE 40 MG PO CPDR: 40.0000 mg | DELAYED_RELEASE_CAPSULE | Freq: Every day | ORAL | 3 refills | Status: DC

## 2021-05-21 NOTE — Progress Notes (Signed)
? ?Subjective:  ? ? Patient ID: Julian Washington, male    DOB: 05-19-1980, 41 y.o.   MRN: [id number] ? ?DOS:  05/21/2021 ?Type of visit - description: cpx ? ?Since the last office visit, saw cardiology, notes reviewed. ?Started weight watchers few weeks ago, has lost approximately 10 pounds. ?He denies chest pain or difficulty breathing. ?BP noted to be slightly elevated today and it has been elevated at home a couple times ? ?Wt Readings from Last 3 Encounters:  ?05/21/21 (!) 370 lb (167.8 kg)  ?03/09/21 (!) 377 lb 6.4 oz (171.2 kg)  ?12/28/20 (!) 367 lb (166.5 kg)  ? ? ?BP Readings from Last 3 Encounters:  ?05/21/21 (!) 162/98  ?03/09/21 138/82  ?12/28/20 (!) 146/84  ? ? ?Review of Systems ? ?Other than above, a 14 point review of systems is negative  ? ? ? ?Past Medical History:  ?Diagnosis Date  ? ADHD   ? Anemia   ? Anxiety and depression   ? h/o suicidality w/ citalopram  ? Diabetes mellitus without complication ([place])   ? Difficult intubation   ? ETD (eustachian tube dysfunction)   ? s/p ENT, declined ear tuves before   ? GERD (gastroesophageal reflux disease)   ? History of chicken pox   ? Titered on 08/16/2005  ? Hypogonadism male   ? Dr Cruzita Lederer, used to see urology  ? Infertility male   ? Internal hemorrhoids   ? s/p banding  ? Low testosterone   ? Migraine   ? on topamax  ? OSA on CPAP   ? Palpitations   ? 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 h/o increased prolactin, Dr Cruzita Lederer (previously @ [facility])  ? Prediabetes   ? A1C 6.2 years ago  ? Retention cyst of paranasal sinus   ? Left frontal sinus  ? Vitamin B 12 deficiency   ? h/o  ? Vitamin D deficiency   ? ? ?Past Surgical History:  ?Procedure Laterality Date  ? 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-04-2012  ? prolactinoma, ACTH  ? TONSILLECTOMY AND ADENOIDECTOMY    ? ?Social History  ? ?Socioeconomic History  ? Marital status: Married  ?  Spouse name: Not on file  ? Number of children: 0  ? Years of education: Not on file  ? Highest education level: Not on file  ?Occupational History  ?  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  Employer: [employer]  ? 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 Smoking status: Never  ? Smokeless tobacco: Never  ?Vaping Use  ? Vaping Use: Never used  ?Substance and Sexual Activity  ? Alcohol use: Not Currently  ?  Comment: none x 10 yrs  ? Drug use: No  ? Sexual activity: Not on file  ?Other Topics Concern  ? Not on file  ?Social History Narrative  ? Lives w/ wife  ? ?Social Determinants of Health  ? ?Financial Resource Strain: Not on file  ?Food Insecurity: Not on file  ?Transportation Needs: Not on file  ?Physical Activity: Not on file  ?Stress: Not on file  ?Social Connections: Not on file  ?Intimate Partner Violence: Not on file  ? ? ? ?Current Outpatient Medications  ?Medication Instructions  ? ACCU-CHEK FASTCLIX LANCETS MISC 1 Package, Does not apply, 2 times daily  ? acetaminophen (TYLENOL) 500-1,000 mg, Oral, Every 6 hours PRN  ? apixaban (ELIQUIS) 5 mg, Oral, 2 times daily  ? atomoxetine (STRATTERA) 100 mg, Oral, Daily  ?  fexofenadine (ALLEGRA) 180 mg, Oral, Daily at bedtime  ? flecainide (TAMBOCOR) 100 mg, Oral, Every 12 hours  ? fluticasone (FLONASE) 50 MCG/ACT nasal spray 1 spray, Each Nare, Daily at bedtime  ? furosemide (LASIX) 20 mg, Oral, Daily PRN  ? glucose blood (ACCU-CHEK GUIDE) test strip 1 each, Other, 2 times daily, Use as instructed   ? ketoconazole (NIZORAL) 2 % cream 1 application., Topical, Daily  ? Magnesium Oxide (MAG-OXIDE PO) 400 mg, Oral, Daily at bedtime  ? metFORMIN (GLUCOPHAGE) 1,000 mg, Oral, 2 times daily with meals  ? metoprolol succinate (TOPROL-XL) 50 mg, Oral, 2 times daily, Take with or immediately following a meal.  ? omeprazole (PRILOSEC) 40 mg, Oral, Daily before breakfast  ? Skyrizi (150 MG Dose) 150 mg, Subcutaneous, See admin instructions, Every 3 months  ? ? ?   ?Objective:  ? Physical Exam ?BP (!) 162/98 (BP Location: Left Arm, Patient Position: Sitting, Cuff Size: Normal)   Pulse 67   Temp  98 ?F (36.7 ?C) (Oral)   Resp 18   Ht '5\' 11"'$  (1.803 m)   Wt (!) 370 lb (167.8 kg)   SpO2 97%   BMI 51.60 kg/m?  ?General: ?Well developed, NAD, BMI noted ?Neck: No  thyromegaly  ?HEENT:  ?Normocephalic . Face symmetric, atraumatic ?Lungs:  ?CTA B ?Normal respiratory effort, no intercostal retractions, no accessory muscle use. ?Heart: RRR,  no murmur.  ?Abdomen:  ?Not distended, soft, non-tender. No rebound or rigidity.   ?Lower extremities: Lateral edema at baseline, pretibial skin color at baseline (violaceous) no warm, no TTP ?Skin: Exposed areas without rash. Not pale. Not jaundice ?Neurologic:  ?alert & oriented X3.  ?Speech normal, gait appropriate for age and unassisted ?Strength symmetric and appropriate for age.  ?Psych: ?Cognition and judgment appear intact.  ?Cooperative with normal attention span and concentration.  ?Behavior appropriate. ?No anxious or depressed appearing. ? ?   ?Assessment   ? ?  Assessment   ?DM ?Chronic venous insufficiency/lymphedema/stasis dermatitis ?PSYCH: ?--Anxiety depression (suicidality w/  Citalopram) ?--ADHD Dx 2019, Dr Johnnye Sima ?Morbid obesity ?Vitamin D and  B12 deficiency ?CV: ?New onset A. fib 11-2020.  Admitted  ?Calcium coronary score 0 (11-2020) ?Endocrinology:Dr Gherghe ?--Pituitary mass, surgery : Prolactinoma ?--Hypogonadism hypogonadotropic ?--Infertility ?Migraines, d/c Topamax 05-2015 (pt concerned about short term memory)  ?OSA on CPAP -- DR Halford Chessman ?Hematology ?-Mild chronic anemia, no previous colonoscopy or EGD. No symptoms. Iron , vitamins normal.  saw hematology 09/2017  ?-Splenomegaly: Per CT 09/20/2017, saw hematology ?--Abdominal lymphadenopathies: Felt to be stable, see hematology note from July 14, 2019. ?Pulmonary nodules: Felt to be stable per last CT. ?DERM: ?Psoriasis  ?H/o ET  dysfunction, declined ear tubes ?H/o hemorrhoids s/p  banding ?H/o abd pain 2012 (w/u CT, [place], a HIDA scan), similar sx 02-2015 ?H/o  difficulty intubation ?  ? ?PLAN: ?Here  for CPX ?DM: On metformin, doing better with diet, check A1c and micro ?Chronic venous insufficiency: LE edema @ baseline, uses Lasix infrequently ?Atrial fibrillation: ?Last visit with cardiology 03/09/2021, Dx was persistent A-fib, maintaining normal rhythm with flecainide. ?No ablation candidate due to BMI. Next OV ~ 6 m ?Elevated BP: No history of hypertension, elevated BP noted, on metoprolol mostly for A-fib.  On Lasix for edema, takes infrequently.  Plan: Low-salt diet, continue weight loss, monitor BPs.  Reassess in 3 months. ?Morbid obesity: Last visit with the wellness clinic was November 2022, currently doing weight watchers. Praised  ?Prolactinoma, hypogonadism, h/o: Referred to Endo, has not been seen  in years. ?RTC 3 months  ? ? ? ?This visit occurred during the SARS-CoV-2 public health emergency.  Safety protocols were in place, including screening questions prior to the visit, additional usage of staff PPE, and extensive cleaning of exam room while observing appropriate contact time as indicated for disinfecting solutions.  ? ?

## 2021-05-21 NOTE — Patient Instructions (Addendum)
Continue weight watchers. ?Watch your salt intake ? ? ? ?Check the  blood pressure regularly (twice a week) ?BP GOAL is between 110/65 and  135/85. ?If it is consistently higher or lower, let me know ? ? ?GO TO THE LAB : Get the blood work   ? ? ?[place], [place] ?Come back for   a checkup in 3 months ?

## 2021-05-22 ENCOUNTER — Other Ambulatory Visit: Payer: Self-pay

## 2021-05-22 ENCOUNTER — Encounter: Payer: Self-pay | Admitting: Internal Medicine

## 2021-05-22 LAB — COMPREHENSIVE METABOLIC PANEL
ALT: 15 U/L (ref 0–53)
AST: 12 U/L (ref 0–37)
Albumin: 4.4 g/dL (ref 3.5–5.2)
Alkaline Phosphatase: 55 U/L (ref 39–117)
BUN: 15 mg/dL (ref 6–23)
CO2: 27 mEq/L (ref 19–32)
Calcium: 9.3 mg/dL (ref 8.4–10.5)
Chloride: 104 mEq/L (ref 96–112)
Creatinine, Ser: 0.79 mg/dL (ref 0.40–1.50)
GFR: 111.41 mL/min (ref >60.00)
Glucose, Bld: 100 mg/dL — ABNORMAL HIGH (ref 70–99)
Potassium: 4.6 mEq/L (ref 3.5–5.1)
Sodium: 138 mEq/L (ref 135–145)
Total Bilirubin: 0.4 mg/dL (ref 0.2–1.2)
Total Protein: 6.4 g/dL (ref 6.0–8.3)

## 2021-05-22 LAB — CBC WITH DIFFERENTIAL/PLATELET
Basophils Absolute: 0.1 10*3/uL (ref 0.0–0.1)
Basophils Relative: 1.4 % (ref 0.0–3.0)
Eosinophils Absolute: 0.1 10*3/uL (ref 0.0–0.7)
Eosinophils Relative: 1.7 % (ref 0.0–5.0)
HCT: 39.1 % (ref 39.0–52.0)
Hemoglobin: 13.1 g/dL (ref 13.0–17.0)
Lymphocytes Relative: 34.5 % (ref 12.0–46.0)
Lymphs Abs: 2.5 10*3/uL (ref 0.7–4.0)
MCHC: 33.6 g/dL (ref 30.0–36.0)
MCV: 80.8 fl (ref 78.0–100.0)
Monocytes Absolute: 0.3 10*3/uL (ref 0.1–1.0)
Monocytes Relative: 4.6 % (ref 3.0–12.0)
Neutro Abs: 4.2 10*3/uL (ref 1.4–7.7)
Neutrophils Relative %: 57.8 % (ref 43.0–77.0)
Platelets: 164 10*3/uL (ref 150.0–400.0)
RBC: 4.84 Mil/uL (ref 4.22–5.81)
RDW: 14.1 % (ref 11.5–15.5)
WBC: 7.3 10*3/uL (ref 4.0–10.5)

## 2021-05-22 LAB — MICROALBUMIN / CREATININE URINE RATIO
Creatinine,U: 91 mg/dL
Microalb Creat Ratio: 3.7 mg/g (ref 0.0–30.0)
Microalb, Ur: 3.4 mg/dL — ABNORMAL HIGH (ref 0.0–1.9)

## 2021-05-22 LAB — LIPID PANEL
Cholesterol: 128 mg/dL (ref 0–200)
HDL: 43 mg/dL (ref >39.00)
LDL Cholesterol: 67 mg/dL (ref 0–99)
NonHDL: 84.68
Total CHOL/HDL Ratio: 3
Triglycerides: 90 mg/dL (ref 0.0–149.0)
VLDL: 18 mg/dL (ref 0.0–40.0)

## 2021-05-22 LAB — HEMOGLOBIN A1C: Hgb A1c MFr Bld: 6.2 % (ref 4.6–6.5)

## 2021-05-22 LAB — TSH: TSH: 1.92 u[IU]/mL (ref 0.35–5.50)

## 2021-05-22 MED ORDER — METFORMIN HCL 1000 MG PO TABS: 1000.0000 mg | ORAL_TABLET | Freq: Two times a day (BID) | ORAL | 0 refills | Status: DC

## 2021-05-22 MED ORDER — METFORMIN HCL ER (OSM) 1000 MG PO TB24: 2000.0000 mg | ORAL_TABLET | Freq: Every day | ORAL | 1 refills | Status: DC

## 2021-05-22 NOTE — Assessment & Plan Note (Signed)
Here for CPX ?DM: On metformin, doing better with diet, check A1c and micro ?Chronic venous insufficiency: LE edema @ baseline, uses Lasix infrequently ?Atrial fibrillation: ?Last visit with cardiology 03/09/2021, Dx was persistent A-fib, maintaining normal rhythm with flecainide. ?No ablation candidate due to BMI. Next OV ~ 6 m ?Elevated BP: No history of hypertension, elevated BP noted, on metoprolol mostly for A-fib.  On Lasix for edema, takes infrequently.  Plan: Low-salt diet, continue weight loss, monitor BPs.  Reassess in 3 months. ?Morbid obesity: Last visit with the wellness clinic was November 2022, currently doing weight watchers. Praised  ?Prolactinoma, hypogonadism, h/o: Referred to Endo, has not been seen in years. ?RTC 3 months  ?

## 2021-05-22 NOTE — Assessment & Plan Note (Signed)
-  Tdap 2023 ?- PNM 23: 10/2018 ?-COVID vaccine : booster rec  ?- CCS: no FH . Never had a cscope   ?-Labs: CMP FLP CBC A1c micro TSH ?- diet,exercise: Not going to the wellness clinic but following weight watchers successfully.  Praised ?  ? ?

## 2021-05-23 ENCOUNTER — Encounter: Payer: Self-pay | Admitting: Podiatry

## 2021-05-23 ENCOUNTER — Ambulatory Visit: Payer: 59 | Admitting: Podiatry

## 2021-05-23 DIAGNOSIS — S91209A Unspecified open wound of unspecified toe(s) with damage to nail, initial encounter: Secondary | ICD-10-CM | POA: Diagnosis not present

## 2021-05-23 DIAGNOSIS — E119 Type 2 diabetes mellitus without complications: Secondary | ICD-10-CM

## 2021-05-23 DIAGNOSIS — L603 Nail dystrophy: Secondary | ICD-10-CM | POA: Diagnosis not present

## 2021-05-23 NOTE — Patient Instructions (Signed)

## 2021-05-23 NOTE — Progress Notes (Signed)
?Subjective:  ?Patient ID: Julian Washington, male    DOB: [birth year],   MRN: [id number] ? ?Chief Complaint  ?Patient presents with  ? Nail Problem  ?  LEFT GREAT TOE NAIL  ? ? ?41 y.o. male presents for concern of left great toenail. Relates a few days ago a 70 lb dogs stepped on his nail and he had bleeding. He tried to trim some of it back. Has had some soreness but was concerned. He is diabetic and last A1c was 6.2. He is also on Eliquis.  Denies any other pedal complaints. Denies n/v/f/c.  ? ?Past Medical History:  ?Diagnosis Date  ? ADHD   ? Anemia   ? Anxiety and depression   ? h/o suicidality w/ citalopram  ? Diabetes mellitus without complication ([place])   ? Difficult intubation   ? ETD (eustachian tube dysfunction)   ? s/p ENT, declined ear tuves before   ? GERD (gastroesophageal reflux disease)   ? History of chicken pox   ? Titered on 08/16/2005  ? Hypogonadism male   ? Dr Cruzita Lederer, used to see urology  ? Infertility male   ? Internal hemorrhoids   ? s/p banding  ? Low testosterone   ? Migraine   ? on topamax  ? OSA on CPAP   ? Palpitations   ? 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 h/o increased prolactin, Dr Cruzita Lederer (previously @ [facility])  ? Prediabetes   ? A1C 6.2 years ago  ? Retention cyst of paranasal sinus   ? Left frontal sinus  ? Vitamin B 12 deficiency   ? h/o  ? Vitamin D deficiency   ? ? ?Objective:  ?Physical Exam: ?Vascular: DP/PT pulses 2/4 bilateral. CFT <3 seconds. Normal hair growth on digits. No edema.  ?Skin. No lacerations or abrasions bilateral feet. Left hallux nail partially detached medially. Bleeding noted from medial border. Hallux nail removed and nail bed appears healthy underneath.  ?Musculoskeletal: MMT 5/5 bilateral lower extremities in DF, PF, Inversion and Eversion. Deceased ROM in DF of ankle joint.  ?Neurological: Sensation intact to light touch.  ? ?Assessment:  ? ?1. Dystrophic nail   ?2. Traumatic avulsion of nail plate of toe, initial encounter   ? ? ? ?Plan:  ?Patient was evaluated and  treated and all questions answered. ?Patient requesting removal of ingrown nail today. Procedure below.  ?Discussed procedure and post procedure care and patient expressed understanding.  ?Will follow-up in 2 weeks for nail check or sooner if any problems arise.  ? ? ?Procedure:  ?Procedure: total Nail Avulsion of left hallux  ?Surgeon: Lorenda Peck, DPM  ?Pre-op Dx: Ingrown toenail without infection ?Post-op: Same  ?Place of Surgery: Office exam room.  ?Indications for surgery:  Traumatic partial nail avulsion   ? ? ?The patient is requesting removal of nail with chemical matrixectomy. Risks and complications were discussed with the patient for which they understand and written consent was obtained. Under sterile conditions a total of 3 mL of  1% lidocaine plain was infiltrated in a hallux block fashion. Once anesthetized, the skin was prepped in sterile fashion. A tourniquet was then applied. Next the remaining left hallux nail was removed the nail bed was found to be healthy and the decision for permanent avulsion was made. Next phenol was then applied under standard conditions and copiously irrigated. Silvadene was applied. A dry sterile dressing was applied. After application of the dressing the tourniquet was removed and there is found to be an immediate capillary  refill time to the digit. The patient tolerated the procedure well without any complications. Post procedure instructions were discussed the patient for which he verbally understood. Follow-up in two weeks for nail check or sooner if any problems are to arise. Discussed signs/symptoms of infection and directed to call the office immediately should any occur or go directly to the emergency room. In the meantime, encouraged to call the office with any questions, concerns, changes symptoms. ? ? ?Lorenda Peck, DPM  ? ? ?

## 2021-05-30 ENCOUNTER — Telehealth: Payer: Self-pay | Admitting: *Deleted

## 2021-05-30 NOTE — Telephone Encounter (Signed)
It is not unusual to have a little redness on around the nail after procedure, if it worsens or starts to be painful or have and drainage let us know and we can sen in abx.  ?

## 2021-05-30 NOTE — Telephone Encounter (Signed)
Patient is calling because his procedural toe is still red on top of the nailbed, should he be concerned? Please advise. ?

## 2021-05-30 NOTE — Telephone Encounter (Signed)
Called patient to notify of Dr Erasmo Downer information, verbalized understanding.

## 2021-06-05 ENCOUNTER — Ambulatory Visit: Payer: 59 | Admitting: Podiatry

## 2021-06-05 ENCOUNTER — Encounter: Payer: Self-pay | Admitting: Podiatry

## 2021-06-05 DIAGNOSIS — E119 Type 2 diabetes mellitus without complications: Secondary | ICD-10-CM

## 2021-06-05 DIAGNOSIS — B353 Tinea pedis: Secondary | ICD-10-CM

## 2021-06-05 DIAGNOSIS — L603 Nail dystrophy: Secondary | ICD-10-CM

## 2021-06-05 NOTE — Progress Notes (Signed)
?  Subjective:  ?Patient ID: Julian Washington, male    DOB: 1980-06-03,   MRN: [id number] ? ?Chief Complaint  ?Patient presents with  ? Nail Problem  ?   ?Left foot great toe nail check   ? ? ?41 y.o. male presents for follow-up of left great toenail. Relates doing well not having too much pain. Has been soaking and doing neosporin. Relates he has been using ketoconazole in between toes and not sure if helping . Denies any other pedal complaints. Denies n/v/f/c.  ? ?Past Medical History:  ?Diagnosis Date  ? ADHD   ? Anemia   ? Anxiety and depression   ? h/o suicidality w/ citalopram  ? Diabetes mellitus without complication ([place])   ? Difficult intubation   ? ETD (eustachian tube dysfunction)   ? s/p ENT, declined ear tuves before   ? GERD (gastroesophageal reflux disease)   ? History of chicken pox   ? Titered on 08/16/2005  ? Hypogonadism male   ? Dr Cruzita Lederer, used to see urology  ? Infertility male   ? Internal hemorrhoids   ? s/p banding  ? Low testosterone   ? Migraine   ? on topamax  ? OSA on CPAP   ? Palpitations   ? 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 h/o increased prolactin, Dr Cruzita Lederer (previously @ [facility])  ? Prediabetes   ? A1C 6.2 years ago  ? Retention cyst of paranasal sinus   ? Left frontal sinus  ? Vitamin B 12 deficiency   ? h/o  ? Vitamin D deficiency   ? ? ?Objective:  ?Physical Exam: ?Vascular: DP/PT pulses 2/4 bilateral. CFT <3 seconds. Normal hair growth on digits. No edema.  ?Skin. No lacerations or abrasions bilateral feet. Left great toe well healing. No signs of infection. Maceration noted to third and fourth interspaces on left.  ?Musculoskeletal: MMT 5/5 bilateral lower extremities in DF, PF, Inversion and Eversion. Deceased ROM in DF of ankle joint.  ?Neurological: Sensation intact to light touch.  ? ?Assessment:  ? ?1. Dystrophic nail   ?2. Type 2 diabetes mellitus without complication, without long-term current use of insulin ([place])   ?3. Tinea pedis of both feet   ? ? ? ?Plan:  ?Patient was evaluated and  treated and all questions answered. ?Toe was evaluated and appears to be healing well.  ?May discontinue soaks and neosporin.  ?Continue ketoconazole and add betadine in between toes every other day.  ?Patient to follow-up one year for diabetic foot check. .  ? ? ?Lorenda Peck, DPM  ? ? ?

## 2021-07-25 ENCOUNTER — Telehealth: Payer: Self-pay

## 2021-07-25 NOTE — Telephone Encounter (Signed)
PA initiated via Covermymeds; KEY; [id number]. PA approved.   [id number];Review Type:Prior Auth;Coverage Start Date:06/25/2021;Coverage End Date:07/25/2022;

## 2021-08-21 ENCOUNTER — Telehealth: Payer: Self-pay

## 2021-08-21 ENCOUNTER — Ambulatory Visit: Payer: 59 | Admitting: Internal Medicine

## 2021-08-21 ENCOUNTER — Encounter: Payer: Self-pay | Admitting: Internal Medicine

## 2021-08-21 VITALS — BP 136/80 | HR 72 | Temp 97.9°F | Resp 18 | Ht 71.0 in | Wt 367.2 lb

## 2021-08-21 DIAGNOSIS — E538 Deficiency of other specified B group vitamins: Secondary | ICD-10-CM | POA: Diagnosis not present

## 2021-08-21 DIAGNOSIS — E119 Type 2 diabetes mellitus without complications: Secondary | ICD-10-CM | POA: Diagnosis not present

## 2021-08-21 DIAGNOSIS — E559 Vitamin D deficiency, unspecified: Secondary | ICD-10-CM

## 2021-08-21 DIAGNOSIS — I10 Essential (primary) hypertension: Secondary | ICD-10-CM

## 2021-08-21 DIAGNOSIS — E785 Hyperlipidemia, unspecified: Secondary | ICD-10-CM | POA: Insufficient documentation

## 2021-08-21 MED ORDER — WEGOVY 0.25 MG/0.5ML ~~LOC~~ SOAJ
0.2500 mg | SUBCUTANEOUS | 0 refills | Status: DC
Start: 2021-08-21 — End: 2021-08-21

## 2021-08-21 MED ORDER — ROSUVASTATIN CALCIUM 10 MG PO TABS: 10.0000 mg | ORAL_TABLET | Freq: Every day | ORAL | 0 refills | Status: DC

## 2021-08-21 MED ORDER — OZEMPIC (0.25 OR 0.5 MG/DOSE) 2 MG/3ML ~~LOC~~ SOPN: PEN_INJECTOR | SUBCUTANEOUS | 3 refills | Status: DC

## 2021-08-21 NOTE — Telephone Encounter (Signed)
PA initiated via Covermymeds; KEY: [id number]. PA approved.   This request has been approved using information available on the patient's profile. [id number];Review Type:Prior Auth;Coverage Start Date:07/22/2021;Coverage End Date:08/21/2022;

## 2021-08-21 NOTE — Assessment & Plan Note (Signed)
DM: On metformin, well-controlled.  Will add Ozempic if available.  See AVS. Hyperlipidemia: d/t h/o diabetes, will recommend to start Crestor, explained patient the rationale behind the recommendation and agreed to start Morbid obesity: Doing well with weight watchers, has lost 3 pounds.  Will started Ozempic for diabetes, should help obesity as well. Chronic  venous insufficiency, lymphedema, stasis dermatitis: Stable.  See picture. Vitamin D and B12 deficiency: History of, not on supplements, check labs. Prolactinoma, hypogonadism h/o: referral to Dr. Cruzita Lederer failed, patient will reach out to the Endo  office. Atrial fib: Seems to be in sinus rhythm, anticoagulated without apparent problems. RTC labs 6 weeks (FLP, vitamin D, B12, AST ALT). RTC office visit 4 months

## 2021-08-21 NOTE — Progress Notes (Signed)
Subjective:    Patient ID: Julian Washington, male    DOB: 1980/07/04, 41 y.o.   MRN: [id number]  DOS:  08/21/2021 Type of visit - description: f/u  Since the last office visit is doing well. Today we talk about morbid obesity, diabetes, vitamin deficiencies. Also the need to take the statins. He has lost 3 pounds.    Wt Readings from Last 3 Encounters:  08/21/21 (!) 367 lb 4 oz (166.6 kg)  05/21/21 (!) 370 lb (167.8 kg)  03/09/21 (!) 377 lb 6.4 oz (171.2 kg)    Review of Systems See above   Past Medical History:  Diagnosis Date   ADHD    Anemia    Anxiety and depression    h/o suicidality w/ citalopram   Diabetes mellitus without complication ([place])    Difficult intubation    ETD (eustachian tube dysfunction)    s/p ENT, declined ear tuves before    GERD (gastroesophageal reflux disease)    History of chicken pox    Titered on 08/16/2005   Hypogonadism male    Dr Cruzita Lederer, used to see urology   Infertility male    Internal hemorrhoids    s/p banding   Low testosterone    Migraine    on topamax   OSA on CPAP    Palpitations    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[place])    h/o increased prolactin, Dr Cruzita Lederer (previously @ [facility])   Prediabetes    A1C 6.2 years ago   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   Vitamin B 12 deficiency    h/o   Vitamin D deficiency     Past Surgical History:  Procedure Laterality Date   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-04-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, ACTH   TONSILLECTOMY AND ADENOIDECTOMY      Current Outpatient Medications  Medication Instructions   ACCU-CHEK FASTCLIX LANCETS MISC 1 Package, Does not apply, 2 times daily   acetaminophen (TYLENOL) 500-1,000 mg, Oral, Every 6 hours PRN   apixaban (ELIQUIS) 5 mg, Oral, 2 times daily   atomoxetine (STRATTERA) 100 mg, Oral, Daily   fexofenadine (ALLEGRA) 180 mg, Oral, Daily at bedtime   flecainide (TAMBOCOR) 100 mg, Oral, Every 12 hours   fluticasone (FLONASE) 50 MCG/ACT nasal spray 1 spray, Each Nare,  Daily at bedtime   furosemide (LASIX) 20 mg, Oral, Daily PRN   glucose blood (ACCU-CHEK GUIDE) test strip 1 each, Other, 2 times daily, Use as instructed    Magnesium Oxide (MAG-OXIDE PO) 400 mg, Oral, Daily at bedtime   metformin (FORTAMET) 2,000 mg, Oral, Daily with breakfast   metoprolol succinate (TOPROL-XL) 50 mg, Oral, 2 times daily, Take with or immediately following a meal.   omeprazole (PRILOSEC) 40 mg, Oral, Daily before breakfast   rosuvastatin (CRESTOR) 10 mg, Oral, Daily at bedtime   Semaglutide,0.25 or 0.'5MG'$ /DOS, (OZEMPIC, 0.25 OR 0.5 MG/DOSE,) 2 MG/3ML SOPN Inject 0.'25mg'$  into skin once weekly for 4 weeks, then increase to 0.'5mg'$  once weekly   Skyrizi (150 MG Dose) 150 mg, Subcutaneous, See admin instructions, Every 3 months       Objective:   Physical Exam BP 136/80   Pulse 72   Temp 97.9 F (36.6 C) (Oral)   Resp 18   Ht '5\' 11"'$  (1.803 m)   Wt (!) 367 lb 4 oz (166.6 kg)   SpO2 97%   BMI 51.22 kg/m  General:   Well developed, NAD, BMI noted. HEENT:  Normocephalic . Face symmetric,  atraumatic Lungs:  CTA B Normal respiratory effort, no intercostal retractions, no accessory muscle use. Heart: RRR,  no murmur.  Lower extremities: Mild edema, see picture Skin: Not pale. Not jaundice Neurologic:  alert & oriented X3.  Speech normal, gait appropriate for age and unassisted Psych--  Cognition and judgment appear intact.  Cooperative with normal attention span and concentration.  Behavior appropriate. No anxious or depressed appearing.       Assessment    Assessment   DM Chronic venous insufficiency/lymphedema/stasis dermatitis PSYCH: --Anxiety depression (suicidality w/  Citalopram) --ADHD Dx 2019, Dr Johnnye Sima Morbid obesity Vitamin D and  B12 deficiency CV: New onset A. fib 11-2020.  Admitted  Calcium coronary score 0 (11-2020) Endocrinology:Dr Gherghe --Pituitary mass, surgery : Prolactinoma --Hypogonadism  hypogonadotropic --Infertility Migraines, d/c Topamax 05-2015 (pt concerned about short term memory)  OSA on CPAP -- DR Halford Chessman Hematology -Mild chronic anemia, no previous colonoscopy or EGD. No symptoms. Iron , vitamins normal.  saw hematology 09/2017  -Splenomegaly: Per CT 09/20/2017, saw hematology --Abdominal lymphadenopathies: Felt to be stable, see hematology note from July 14, 2019. Pulmonary nodules: Felt to be stable per last CT. DERM: Psoriasis  H/o ET  dysfunction, declined ear tubes H/o hemorrhoids s/p  banding H/o abd pain 2012 (w/u CT, [place], a HIDA scan), similar sx 02-2015 H/o  difficulty intubation    PLAN: DM: On metformin, well-controlled.  Will add Ozempic if available.  See AVS. Hyperlipidemia: d/t h/o diabetes, will recommend to start Crestor, explained patient the rationale behind the recommendation and agreed to start Morbid obesity: Doing well with weight watchers, has lost 3 pounds.  Will started Ozempic for diabetes, should help obesity as well. Chronic  venous insufficiency, lymphedema, stasis dermatitis: Stable.  See picture. Vitamin D and B12 deficiency: History of, not on supplements, check labs. Prolactinoma, hypogonadism h/o: referral to Dr. Cruzita Lederer failed, patient will reach out to the Endo  office. Atrial fib: Seems to be in sinus rhythm, anticoagulated without apparent problems. RTC labs 6 weeks (FLP, vitamin D, B12, AST ALT). RTC office visit 4 months

## 2021-08-21 NOTE — Patient Instructions (Addendum)
Recommend to proceed with covid booster (bivalent) at your pharmacy.   Please reach out to Dr. Cruzita Lederer office to see if they can see you  WILL start Ozempic 0.25 mg weekly (if available) for 1 month, if you tolerate okay, will continue with Ozempic at a higher dose next month.  Let us know.  Start rosuvastatin daily   Check the  blood pressure regularly BP GOAL is between 110/65 and  135/85. If it is consistently higher or lower, let me know     [place], [place] back for   blood work only in 6 weeks  Come back for a checkup in 4 months

## 2021-09-07 ENCOUNTER — Encounter: Payer: Self-pay | Admitting: Physician Assistant

## 2021-09-07 ENCOUNTER — Ambulatory Visit: Payer: 59 | Admitting: Physician Assistant

## 2021-09-07 VITALS — BP 138/100 | HR 73 | Ht 71.0 in | Wt 362.4 lb

## 2021-09-07 DIAGNOSIS — I4891 Unspecified atrial fibrillation: Secondary | ICD-10-CM

## 2021-09-07 DIAGNOSIS — Z5181 Encounter for therapeutic drug level monitoring: Secondary | ICD-10-CM

## 2021-09-07 DIAGNOSIS — Z79899 Other long term (current) drug therapy: Secondary | ICD-10-CM | POA: Diagnosis not present

## 2021-09-07 DIAGNOSIS — I10 Essential (primary) hypertension: Secondary | ICD-10-CM

## 2021-09-07 MED ORDER — LOSARTAN POTASSIUM 25 MG PO TABS: 25.0000 mg | ORAL_TABLET | Freq: Every day | ORAL | 3 refills | Status: DC

## 2021-09-07 NOTE — Patient Instructions (Signed)
Medication Instructions:  Your physician has recommended you make the following change in your medication:   START: Losartan '25mg'$  daily  *If you need a refill on your cardiac medications before your next appointment, please call your pharmacy*   Lab Work: BMET at PCP in 2-3 weeks  If you have labs (blood work) drawn today and your tests are completely normal, you will receive your results only by: [place] (if you have MyChart) OR A paper copy in the mail If you have any lab test that is abnormal or we need to change your treatment, we will call you to review the results.   Follow-Up: At [facility], you and your health needs are our priority.  As part of our continuing mission to provide you with exceptional heart care, we have created designated Provider Care Teams.  These Care Teams include your primary Cardiologist (physician) and Advanced Practice Providers (APPs -  Physician Assistants and Nurse Practitioners) who all work together to provide you with the care you need, when you need it.   Your next appointment:   6 month(s)  The format for your next appointment:   In Person  Provider:   You may see Lars Mage, MD or one of the following Advanced Practice Providers on your designated Care Team:   Tommye Standard, [place]

## 2021-09-07 NOTE — Progress Notes (Signed)
Cardiology Office Note Date:  09/07/2021  Patient ID:  Julian Washington 1980-06-14, MRN [id number] PCP:  Colon Branch, MD  Cardiologist:  Dr. Burt Knack Electrophysiologist: Dr. Quentin Ore    Chief Complaint: 6 mo  History of Present Illness: Julian Washington is a 41 y.o. male with history of OSA on CPAP, hypogonadotropic hypogonadism, pituitary mass s/p adenoma removal 2014, DM, HTN, morbid obesity, AFib  Initially saw cardiology back I 2018 with palpitations, discontinuation of weight loss meds did not help, started on BB, monitoring noted PACs, rare PVC  Had  hiatus from the office until a hospitalization Oct 2022 for a syncopal event that occurred while walking to the bathroom, EMS found HR 200 found in Afib w/RVR. Saw Dr. Quentin Ore, planned for a/c, TEE/DCCV, coronary CT and AAD pending this. Not an ablation candidate given morbid obesity Had spontaneous conversion CT noted no CAD, no calcium  Nov 2022 when he saw T. Harriet Pho, PA-C for a post hospital visit, doing well  He saw Dr. Quentin Ore Jan 2023, also doing well, maintaining SR, no changes made, planned for 6 mo APP visit.  TODAY He does not think he has had Afib again No CP, palpitations or cardiac awareness No SOB No recurrent syncope No dizzy spells, near syncope No bleeding or signs of bleeding  He has ADHD and while he manages, some days can be a real struggle, his attention specialist would like to restart therapy, but asked that we weigh in given stimulant component   Afib/AAD hx Diagnosed Oct 2022 Flecainide started Oct 2022   Past Medical History:  Diagnosis Date   ADHD    Anemia    Anxiety and depression    h/o suicidality w/ citalopram   Diabetes mellitus without complication ([place])    Difficult intubation    ETD (eustachian tube dysfunction)    s/p ENT, declined ear tuves before    GERD (gastroesophageal reflux disease)    History of chicken pox    Titered on 08/16/2005   Hypogonadism male    Dr Cruzita Lederer,  used to see urology   Infertility male    Internal hemorrhoids    s/p banding   Low testosterone    Migraine    on topamax   OSA on CPAP    Palpitations    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[place])    h/o increased prolactin, Dr Cruzita Lederer (previously @ [facility])   Prediabetes    A1C 6.2 years ago   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   Vitamin B 12 deficiency    h/o   Vitamin D deficiency     Past Surgical History:  Procedure Laterality Date   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-04-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, ACTH   TONSILLECTOMY AND ADENOIDECTOMY      Current Outpatient Medications  Medication Sig Dispense Refill   ACCU-CHEK FASTCLIX LANCETS MISC 1 Package by Does not apply route 2 (two) times daily. 100 each 0   acetaminophen (TYLENOL) 500 MG tablet Take 500-1,000 mg by mouth every 6 (six) hours as needed for moderate pain or headache.     apixaban (ELIQUIS) 5 MG TABS tablet Take 1 tablet (5 mg total) by mouth 2 (two) times daily. 28 tablet 0   atomoxetine (STRATTERA) 100 MG capsule Take 100 mg by mouth daily.     fexofenadine (ALLEGRA) 180 MG tablet Take 180 mg by mouth at bedtime.     flecainide (TAMBOCOR) 100 MG tablet Take 1 tablet (100 mg  total) by mouth every 12 (twelve) hours. 28 tablet 0   fluticasone (FLONASE) 50 MCG/ACT nasal spray Place 1 spray into both nostrils at bedtime.     furosemide (LASIX) 20 MG tablet Take 1 tablet (20 mg total) by mouth daily as needed. (Patient not taking: Reported on 08/21/2021) 30 tablet 2   glucose blood (ACCU-CHEK GUIDE) test strip 1 each by Other route 2 (two) times daily. Use as instructed 100 each 0   Magnesium Oxide (MAG-OXIDE PO) Take 400 mg by mouth at bedtime.     metformin (FORTAMET) 1000 MG (OSM) 24 hr tablet Take 2 tablets (2,000 mg total) by mouth daily with breakfast. 180 tablet 1   metoprolol succinate (TOPROL-XL) 50 MG 24 hr tablet Take 1 tablet (50 mg total) by mouth 2 (two) times daily. Take with or immediately following a meal. 28 tablet 0    omeprazole (PRILOSEC) 40 MG capsule Take 1 capsule (40 mg total) by mouth daily before breakfast. 90 capsule 3   rosuvastatin (CRESTOR) 10 MG tablet Take 1 tablet (10 mg total) by mouth at bedtime. 90 tablet 0   Semaglutide,0.25 or 0.'5MG'$ /DOS, (OZEMPIC, 0.25 OR 0.5 MG/DOSE,) 2 MG/3ML SOPN Inject 0.'25mg'$  into skin once weekly for 4 weeks, then increase to 0.'5mg'$  once weekly 3 mL 3   SKYRIZI, 150 MG DOSE, 75 MG/0.83ML PSKT Inject 150 mg into the skin See admin instructions. Every 3 months     No current facility-administered medications for this visit.    Allergies:   Levaquin [levofloxacin in d5w], Celexa [citalopram], Dextrans, Hydrocodone-acetaminophen, Latex, Oxycodone, and Shiitake mushroom   Social History:  The patient  reports that he has never smoked. He has never used smokeless tobacco. He reports that he does not currently use alcohol. He reports that he does not use drugs.   Family History:  The patient's family history includes Anxiety disorder in his father; Bipolar disorder in his father; CAD in an other family member; Cancer in his maternal grandfather and paternal grandfather; Depression in his father; Diabetes in his paternal grandfather and paternal grandmother; Drug abuse in his father; Hypertension in his paternal grandmother; Obesity in his mother; Prostate cancer in an other family member.  ROS:  Please see the history of present illness.    All other systems are reviewed and otherwise negative.   PHYSICAL EXAM:  VS:  There were no vitals taken for this visit. BMI: There is no height or weight on file to calculate BMI. Well nourished, well developed, in no acute distress HEENT: normocephalic, atraumatic Neck: no JVD, carotid bruits or masses Cardiac:  RRR; no significant murmurs, no rubs, or gallops Lungs:  CTA b/l, no wheezing, rhonchi or rales Abd: soft, nontender MS: no deformity or atrophy Ext: chronic skin changes and significant edema (improved of late) Skin: warm  and dry, no rash Neuro:  No gross deficits appreciated Psych: euthymic mood, full affect   EKG:  Done today and reviewed by myself shows  SR 73bpm, icRBBB, stable intervals  12/04/20: Coronary CTa IMPRESSION: 1. No evidence of CAD, CADRADS = 0. 2. Coronary calcium score of 0. 3. Normal coronary origins with co-dominance.   12/03/20: TTE  1. Left ventricular ejection fraction, by estimation, is 60 to 65%. The  left ventricle has normal function. The left ventricle has no regional  wall motion abnormalities. There is mild concentric left ventricular  hypertrophy. Left ventricular diastolic  parameters were normal.   2. Right ventricular systolic function is normal. The right ventricular  size is normal.   3. The mitral valve is grossly normal. No evidence of mitral valve  regurgitation. No evidence of mitral stenosis.   4. The aortic valve was not well visualized. Aortic valve regurgitation  is not visualized. No aortic stenosis is present.   Comparison(s): No significant change from prior study.   Conclusion(s)/Recommendation(s): Normal biventricular function without  evidence of hemodynamically significant valvular heart disease.  Technically challenging study, but no significant abnormalities seen.   Recent Labs: 12/07/2020: Magnesium 2.1 05/21/2021: ALT 15; BUN 15; Creatinine, Ser 0.79; Hemoglobin 13.1; Platelets 164.0; Potassium 4.6; Sodium 138; TSH 1.92  05/21/2021: Cholesterol 128; HDL 43.00; LDL Cholesterol 67; Total CHOL/HDL Ratio 3; Triglycerides 90.0; VLDL 18.0   CrCl cannot be calculated (Patient's most recent lab result is older than the maximum 21 days allowed.).   Wt Readings from Last 3 Encounters:  08/21/21 (!) 367 lb 4 oz (166.6 kg)  05/21/21 (!) 370 lb (167.8 kg)  03/09/21 (!) 377 lb 6.4 oz (171.2 kg)     Other studies reviewed: Additional studies/records reviewed today include: summarized above  ASSESSMENT AND PLAN:  Paroxysmal AFib CHA2DS2Vasc is  2, on Eliquis, appropriately dosed Flecainide and metoprolol w/stable intervals zero burden by symptoms  I think his day-to-day function is important as well, and if resuming therapy for his ADHD is needed, would prefer lowest effective dose, he will let us know what medicine they will be going with and check with his flecainide, though if treatment seems to provoke his Afib, will need to revisit and think about alternative strategies   HTN Not well controlled He will usually get 130's-140/90's when he checks Will start losartan '25mg'$  daily BMET in a dfew weeks He will let us know how his BP looks    Disposition: F/u with [place] otherwise in 32mo sooner if needed  Current medicines are reviewed at length with the patient today.  The patient did not have any concerns regarding medicines.  SVenetia Night PA-C 09/07/2021 5:31 AM     [facility] [address] [place] [phone](office)  [phone](fax)

## 2021-10-02 ENCOUNTER — Other Ambulatory Visit: Payer: Self-pay | Admitting: *Deleted

## 2021-10-02 ENCOUNTER — Other Ambulatory Visit (INDEPENDENT_AMBULATORY_CARE_PROVIDER_SITE_OTHER): Payer: 59

## 2021-10-02 ENCOUNTER — Other Ambulatory Visit: Payer: Self-pay | Admitting: Family

## 2021-10-02 DIAGNOSIS — I4891 Unspecified atrial fibrillation: Secondary | ICD-10-CM

## 2021-10-02 DIAGNOSIS — E559 Vitamin D deficiency, unspecified: Secondary | ICD-10-CM

## 2021-10-02 DIAGNOSIS — E119 Type 2 diabetes mellitus without complications: Secondary | ICD-10-CM

## 2021-10-02 DIAGNOSIS — E538 Deficiency of other specified B group vitamins: Secondary | ICD-10-CM

## 2021-10-02 LAB — BASIC METABOLIC PANEL
BUN: 13 mg/dL (ref 6–23)
CO2: 25 mEq/L (ref 19–32)
Calcium: 9 mg/dL (ref 8.4–10.5)
Chloride: 105 mEq/L (ref 96–112)
Creatinine, Ser: 0.79 mg/dL (ref 0.40–1.50)
GFR: 111.12 mL/min (ref >60.00)
Glucose, Bld: 109 mg/dL — ABNORMAL HIGH (ref 70–99)
Potassium: 4.3 mEq/L (ref 3.5–5.1)
Sodium: 141 mEq/L (ref 135–145)

## 2021-10-02 LAB — LIPID PANEL
Cholesterol: 87 mg/dL (ref 0–200)
HDL: 40 mg/dL (ref >39.00)
LDL Cholesterol: 33 mg/dL (ref 0–99)
NonHDL: 47.34
Total CHOL/HDL Ratio: 2
Triglycerides: 72 mg/dL (ref 0.0–149.0)
VLDL: 14.4 mg/dL (ref 0.0–40.0)

## 2021-10-02 LAB — VITAMIN D 25 HYDROXY (VIT D DEFICIENCY, FRACTURES): VITD: 20.37 ng/mL — ABNORMAL LOW (ref 30.00–100.00)

## 2021-10-02 LAB — ALT: ALT: 15 U/L (ref 0–53)

## 2021-10-02 LAB — AST: AST: 13 U/L (ref 0–37)

## 2021-10-02 LAB — B12 AND FOLATE PANEL
Folate: 8.7 ng/mL (ref >5.9)
Vitamin B-12: 365 pg/mL (ref 211–911)

## 2021-10-02 MED ORDER — VITAMIN D (ERGOCALCIFEROL) 1.25 MG (50000 UNIT) PO CAPS: 50000.0000 [IU] | ORAL_CAPSULE | ORAL | 0 refills | Status: DC

## 2021-10-02 NOTE — Progress Notes (Signed)
Pt went to [facility]. Had orders from PCP and Cardiology (bmet).  We are unable to draw labs for cardiology. Pt wanted PCP to order bmet under his labs so he would not have to get stuck twice.  Received verbal from Dr Nani Ravens in PCP's absence ok to order bmet under PCP name.  Spoke to pt and advised him to notify cardiology when bmet results were final as results will come directly to PCP instead of cardiology and pt voiced understanding. Future order placed for [place] lab.

## 2021-10-05 ENCOUNTER — Encounter (HOSPITAL_BASED_OUTPATIENT_CLINIC_OR_DEPARTMENT_OTHER): Payer: Self-pay

## 2021-10-29 ENCOUNTER — Other Ambulatory Visit: Payer: Self-pay

## 2021-10-29 ENCOUNTER — Ambulatory Visit: Payer: 59 | Admitting: Internal Medicine

## 2021-10-29 ENCOUNTER — Ambulatory Visit (HOSPITAL_BASED_OUTPATIENT_CLINIC_OR_DEPARTMENT_OTHER)
Admission: RE | Admit: 2021-10-29 | Discharge: 2021-10-29 | Disposition: A | Discharge status: Home / Self Care | Payer: 59 | Source: Ambulatory Visit | Attending: Internal Medicine | Admitting: Internal Medicine

## 2021-10-29 ENCOUNTER — Encounter: Payer: Self-pay | Admitting: Internal Medicine

## 2021-10-29 VITALS — BP 130/82 | HR 76 | Temp 97.7°F | Resp 18 | Ht 71.0 in | Wt 361.4 lb

## 2021-10-29 DIAGNOSIS — G4452 New daily persistent headache (NDPH): Secondary | ICD-10-CM | POA: Insufficient documentation

## 2021-10-29 DIAGNOSIS — I10 Essential (primary) hypertension: Secondary | ICD-10-CM

## 2021-10-29 DIAGNOSIS — I480 Paroxysmal atrial fibrillation: Secondary | ICD-10-CM | POA: Diagnosis not present

## 2021-10-29 DIAGNOSIS — Z7901 Long term (current) use of anticoagulants: Secondary | ICD-10-CM | POA: Insufficient documentation

## 2021-10-29 MED ORDER — TIZANIDINE HCL 4 MG PO CAPS: 4.0000 mg | ORAL_CAPSULE | Freq: Three times a day (TID) | ORAL | 0 refills | Status: DC

## 2021-10-29 MED ORDER — LOSARTAN POTASSIUM 25 MG PO TABS: 25.0000 mg | ORAL_TABLET | Freq: Every day | ORAL | 3 refills | Status: DC

## 2021-10-29 NOTE — Progress Notes (Unsigned)
Subjective:    Patient ID: Julian Washington, male    DOB: [birth year], 41 y.o.   MRN: [id number]  DOS:  10/29/2021 Type of visit - description: Acute  The patient developed a headache while he was on vacation in [place] at the end of August. The headache started at the left posterior neck and radiate up more.  Described as a "throbbing", "pounding". He is not sure if the headache started acutely or gradually. He is not sure if the headache was better or worse by moving his head/neck. He decided to hold the statin and start taking ibuprofen to see if that decreased the headache. By Labor Day September 4 he was feeling better and since then he only has intermittent headaches, not as severe, not associated with neck discomfort.  Had no nausea or vomiting No visual disturbances Was not the worst headache of his life Question of photophobia, no phonophobia No slurred speech or motor deficits.   Review of Systems See above   Past Medical History:  Diagnosis Date   ADHD    Anemia    Anxiety and depression    h/o suicidality w/ citalopram   Diabetes mellitus without complication ([place])    Difficult intubation    ETD (eustachian tube dysfunction)    s/p ENT, declined ear tuves before    GERD (gastroesophageal reflux disease)    History of chicken pox    Titered on 08/16/2005   Hypogonadism male    Dr Cruzita Lederer, used to see urology   Infertility male    Internal hemorrhoids    s/p banding   Low testosterone    Migraine    on topamax   OSA on CPAP    Palpitations    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[place])    h/o increased prolactin, Dr Cruzita Lederer (previously @ [facility])   Prediabetes    A1C 6.2 years ago   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   Vitamin B 12 deficiency    h/o   Vitamin D deficiency     Past Surgical History:  Procedure Laterality Date   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-04-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, ACTH   TONSILLECTOMY AND ADENOIDECTOMY      Current Outpatient  Medications  Medication Instructions   ACCU-CHEK FASTCLIX LANCETS MISC 1 Package, Does not apply, 2 times daily   acetaminophen (TYLENOL) 500-1,000 mg, Oral, Every 6 hours PRN   apixaban (ELIQUIS) 5 mg, Oral, 2 times daily   atomoxetine (STRATTERA) 100 mg, Oral, Daily   fexofenadine (ALLEGRA) 180 mg, Oral, Daily at bedtime   flecainide (TAMBOCOR) 100 mg, Oral, Every 12 hours   fluticasone (FLONASE) 50 MCG/ACT nasal spray 1 spray, Each Nare, Daily at bedtime   glucose blood (ACCU-CHEK GUIDE) test strip 1 each, Other, 2 times daily, Use as instructed    losartan (COZAAR) 25 mg, Oral, Daily   Magnesium Oxide (MAG-OXIDE PO) 400 mg, Oral, Daily at bedtime   metformin (FORTAMET) 2,000 mg, Oral, Daily with breakfast   metoprolol succinate (TOPROL-XL) 50 mg, Oral, 2 times daily, Take with or immediately following a meal.   omeprazole (PRILOSEC) 40 mg, Oral, Daily before breakfast   rosuvastatin (CRESTOR) 10 mg, Oral, Daily at bedtime   Semaglutide,0.25 or 0.'5MG'$ /DOS, (OZEMPIC, 0.25 OR 0.5 MG/DOSE,) 2 MG/3ML SOPN Inject 0.'25mg'$  into skin once weekly for 4 weeks, then increase to 0.'5mg'$  once weekly   Skyrizi (150 MG Dose) 150 mg, Subcutaneous, See admin instructions, Every 3 months   tiZANidine (  ZANAFLEX) 4 mg, Oral, 3 times daily   Vitamin D (Ergocalciferol) (DRISDOL) 50,000 Units, Oral, Every 7 days       Objective:   Physical Exam BP 130/82   Pulse 76   Temp 97.7 F (36.5 C) (Oral)   Resp 18   Ht '5\' 11"'$  (1.803 m)   Wt (!) 361 lb 6 oz (163.9 kg)   SpO2 96%   BMI 50.40 kg/m  General:   Well developed, NAD, BMI noted. HEENT:  Normocephalic . Face symmetric, atraumatic Neck: No TTP, range of motion normal Lungs:  CTA B Normal respiratory effort, no intercostal retractions, no accessory muscle use. Heart: RRR,  no murmur.  Lower extremities: no pretibial edema bilaterally  Skin: Not pale. Not jaundice Neurologic:  alert & oriented X3.  Speech normal, gait appropriate for age and  unassisted. EOMI, pupils equal and reactive. DTRs: Symmetric, decreased knee jerk bilaterally. Psych--  Cognition and judgment appear intact.  Cooperative with normal attention span and concentration.  Behavior appropriate. No anxious or depressed appearing.      Assessment     Assessment   DM Chronic venous insufficiency/lymphedema/stasis dermatitis PSYCH: --Anxiety depression (suicidality w/  Citalopram) --ADHD Dx 2019, Dr Johnnye Sima Morbid obesity Vitamin D and  B12 deficiency CV: New onset A. fib 11-2020.  Admitted  Calcium coronary score 0 (11-2020) Endocrinology:Dr Gherghe --Pituitary mass, surgery : Prolactinoma --Hypogonadism hypogonadotropic --Infertility Migraines, d/c Topamax 05-2015 (pt concerned about short term memory)  OSA on CPAP -- DR Halford Chessman Hematology -Mild chronic anemia, no previous colonoscopy or EGD. No symptoms. Iron , vitamins normal.  saw hematology 09/2017  -Splenomegaly: Per CT 09/20/2017, saw hematology --Abdominal lymphadenopathies: Felt to be stable, see hematology note from July 14, 2019. Pulmonary nodules: Felt to be stable per last CT. DERM: Psoriasis  H/o ET  dysfunction, declined ear tubes H/o hemorrhoids s/p  banding H/o abd pain 2012 (w/u CT, [place], a HIDA scan), similar sx 02-2015 H/o  difficulty intubation    PLAN: Headache: As described above, started late August, improved after he stopped statins and started NSAIDs but unclear if that really helped. Hass a h/o migraines, used to take Topamax.  Current headaches are a little different migraines. History of pituitary prolactinoma, s/p surgery. Neurological exam today is nonfocal.  He is in no distress. Plan: CT head today Okay to restart rosuvastatin Avoid NSAIDs, he is anticoagulated Tylenol if needed Tensional versus cervicogenic headache?  Trial with tizanidine, no interactions noted, Rx sent. To let me know next week how he is doing ER if severe headache HTN: BP noted to be  elevated during recent visit 08/30/2021, started losartan, follow-up BMP satisfactory Paroxysmal A-fib: Saw cardiology 09/07/2021.

## 2021-10-29 NOTE — Patient Instructions (Signed)
Stop by the first floor and get a CAT scan of your head  Go back on rosuvastatin  Avoid ibuprofen or any other NSAID.  For headaches okay to take Tylenol 500 mg: 2 tablets every 8 hours as needed  Also okay to try tizanidine, and muscle relaxant.  Watch for excessive drowsiness.  If you have severe headache: Go to the ER  Please call me next week let me know how you are doing  We will consider neurology referral  See you in November for your routine appointment.

## 2021-10-30 NOTE — Assessment & Plan Note (Signed)
Headache: As described above, started late August, improved after he stopped statins and started NSAIDs but unclear if that really helped. Hass a h/o migraines, used to take Topamax.  Current headaches are a little different migraines. History of pituitary prolactinoma, s/p surgery. Neurological exam today is nonfocal.  He is in no distress. Plan: CT head today Okay to restart rosuvastatin Avoid NSAIDs, he is anticoagulated Tylenol if needed Tensional versus cervicogenic headache?  Trial with tizanidine, no interactions noted, Rx sent. To let me know next week how he is doing ER if severe headache HTN: BP noted to be elevated during recent visit 08/30/2021, started losartan, follow-up BMP satisfactory Paroxysmal A-fib: Saw cardiology 09/07/2021.

## 2021-10-31 ENCOUNTER — Other Ambulatory Visit: Payer: Self-pay | Admitting: Internal Medicine

## 2021-10-31 MED ORDER — OMEPRAZOLE 40 MG PO CPDR: 40.0000 mg | DELAYED_RELEASE_CAPSULE | Freq: Every day | ORAL | 3 refills | Status: DC

## 2021-11-07 ENCOUNTER — Telehealth: Payer: Self-pay | Admitting: Internal Medicine

## 2021-11-07 NOTE — Telephone Encounter (Signed)
Patient is calling saying that his PCP is requesting he schedule an appointment.  His last appointment was 12/03/2018.  Dr. Arman Filter next appointment is after that date.  Since he would be considered a new patient after 12/02/2021 (1) Does he need a referral (2) since Dr. Cruzita Lederer is not taking new patients does he need to be scheduled with Dr. Kelton Pillar? And (3) since it could be considered transition of care would permission need to come from both providers?

## 2021-11-11 ENCOUNTER — Encounter: Payer: Self-pay | Admitting: Internal Medicine

## 2021-11-12 ENCOUNTER — Ambulatory Visit: Payer: 59 | Admitting: Adult Health

## 2021-11-12 ENCOUNTER — Encounter: Payer: Self-pay | Admitting: Adult Health

## 2021-11-12 ENCOUNTER — Other Ambulatory Visit (HOSPITAL_BASED_OUTPATIENT_CLINIC_OR_DEPARTMENT_OTHER): Payer: Self-pay | Admitting: Family

## 2021-11-12 VITALS — BP 134/86 | HR 78 | Temp 98.3°F | Ht 71.0 in | Wt 364.4 lb

## 2021-11-12 DIAGNOSIS — G4733 Obstructive sleep apnea (adult) (pediatric): Secondary | ICD-10-CM | POA: Diagnosis not present

## 2021-11-12 MED ORDER — SEMAGLUTIDE (1 MG/DOSE) 4 MG/3ML ~~LOC~~ SOPN: 1.0000 mg | PEN_INJECTOR | SUBCUTANEOUS | 1 refills | Status: DC

## 2021-11-12 NOTE — Assessment & Plan Note (Addendum)
Reported excellent compliance on CPAP.  Has perceived benefit on CPAP.  Will order new CPAP- continue current settings and supplies. CPAP download requested.  Patient will bring back his SD card for download.  Plan  Patient Instructions  Continue on CPAP at bedtime goal is to wear for more than 6 hours each night Bring SD card for CPAP download .   Order for new CPAP.  Work on healthy weight loss Do not drive if sleepy Follow-up with Dr. Halford Chessman in 1 year and As needed

## 2021-11-12 NOTE — Addendum Note (Signed)
Addended byDamita Dunnings D on: 11/12/2021 04:44 PM   Modules accepted: Orders

## 2021-11-12 NOTE — Telephone Encounter (Signed)
Okay to proceed, send the prescription and let him know

## 2021-11-12 NOTE — Telephone Encounter (Signed)
Rx(s) sent to pharmacy electronically.  

## 2021-11-12 NOTE — Assessment & Plan Note (Signed)
Healthy weight loss discussed 

## 2021-11-12 NOTE — Progress Notes (Signed)
Reviewed and agree with assessment/plan.   Chesley Mires, MD [facility] Pulmonary/Critical Care 11/12/2021, 9:47 AM Pager:  [phone]

## 2021-11-12 NOTE — Patient Instructions (Addendum)
Continue on CPAP at bedtime goal is to wear for more than 6 hours each night Bring SD card for CPAP download .   Order for new CPAP.  Work on healthy weight loss Do not drive if sleepy Follow-up with Dr. Halford Chessman in 1 year and As needed

## 2021-11-12 NOTE — Progress Notes (Signed)
'@Patient'  ID: Julian Washington, male    DOB: May 08, 1980, 41 y.o.   MRN: [id number]  Chief Complaint  Patient presents with   Follow-up    Referring provider: Colon Branch, MD  HPI: 41 year old male followed for obstructive sleep apnea History significant for diabetes, A-fib, hypertension, psoriasis, history of pituitary tumor-status post adenoma removal 2014,, ADHD Clinical nurse specialist at [facility]  TEST/EVENTS :  HST 12/09/09 ([facility]) >> AHI 27.3, SaO2 low 43% CPAP 09/05/19 to 10/04/19 >> used on 30 of 30 nights with average 7 hrs 34 min.  Average AHI 0.4 with CPAP 15 cm H2O.   Cardiac Tests:  Echo 03/20/16 >> EF 55 to 60%, mild LVH  11/12/2021 Follow up : OSA  Patient presents for follow-up visit.  Last seen August 2021.  Patient has underlying moderate obstructive sleep apnea.  Patient says he wears a CPAP every single night.  Typically gets and 7 or more hours each night. CPAP download was requested.  Patient says he will bring his SD card back to the office for a download he uses [employer] [facility]  Patient uses a nasal mask.  Says that he cannot sleep without his CPAP.  Says his CPAP machine is over 1 years old and needs a new CPAP.  Patient says he feels that he benefits from CPAP with decreased daytime sleepiness.      Allergies  Allergen Reactions   Levaquin [Levofloxacin In D5w] Swelling   Celexa [Citalopram] Other (See Comments)    Mental status changes    Dextrans Other (See Comments)    Makes the pt. Drowsy.    Hydrocodone-Acetaminophen     REACTION: itching   Latex    Oxycodone Itching   Shiitake Mushroom Rash    Immunization History  Administered Date(s) Administered   DTaP 06/22/1981, 10/10/1981, 02/16/1982, 07/14/1986   Hepatitis B 11/12/1996, 12/13/1996, 07/19/1997, 07/18/1998   IPV 06/22/1981, 10/10/1981, 02/16/1982, 05/31/1983, 07/14/1986   Influenza Split 11/15/2013, 12/07/2016, 11/12/2019   Influenza,inj,Quad PF,6+ Mos 11/23/2014,  11/27/2017   Influenza-Unspecified 11/06/2015, 10/14/2016, 11/30/2020   MMR 08/08/1982, 07/18/1998   PFIZER(Purple Top)SARS-COV-2 Vaccination 02/09/2019, 03/02/2019, 10/17/2019   Pneumococcal Polysaccharide-23 11/11/2018   Td 05/31/1983, 06/24/2000   Tdap 07/13/2013, 04/02/2021    Past Medical History:  Diagnosis Date   ADHD    Anemia    Anxiety and depression    h/o suicidality w/ citalopram   Atrial fibrillation (HCC)    Diabetes mellitus without complication ([place])    Difficult intubation    ETD (eustachian tube dysfunction)    s/p ENT, declined ear tuves before    GERD (gastroesophageal reflux disease)    History of chicken pox    Titered on 08/16/2005   Hypogonadism male    Dr Cruzita Lederer, used to see urology   Infertility male    Internal hemorrhoids    s/p banding   Low testosterone    Migraine    on topamax   OSA on CPAP    Palpitations    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[place])    h/o increased prolactin, Dr Cruzita Lederer (previously @ [facility])   Prediabetes    A1C 6.2 years ago   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   Vitamin B 12 deficiency    h/o   Vitamin D deficiency     Tobacco History: Social History   Tobacco Use  Smoking Status Never  Smokeless Tobacco Never   Counseling given: Not Answered   Outpatient Medications  Prior to Visit  Medication Sig Dispense Refill   ACCU-CHEK FASTCLIX LANCETS MISC 1 Package by Does not apply route 2 (two) times daily. 100 each 0   acetaminophen (TYLENOL) 500 MG tablet Take 500-1,000 mg by mouth every 6 (six) hours as needed for moderate pain or headache.     apixaban (ELIQUIS) 5 MG TABS tablet Take 1 tablet (5 mg total) by mouth 2 (two) times daily. 28 tablet 0   atomoxetine (STRATTERA) 100 MG capsule Take 100 mg by mouth daily.     fexofenadine (ALLEGRA) 180 MG tablet Take 180 mg by mouth at bedtime.     flecainide (TAMBOCOR) 100 MG tablet TAKE 1 TABLET EVERY 12 HOURS 180 tablet 3   fluticasone (FLONASE) 50 MCG/ACT  nasal spray Place 1 spray into both nostrils at bedtime.     glucose blood (ACCU-CHEK GUIDE) test strip 1 each by Other route 2 (two) times daily. Use as instructed 100 each 0   losartan (COZAAR) 25 MG tablet Take 1 tablet (25 mg total) by mouth daily. 90 tablet 3   Magnesium Oxide (MAG-OXIDE PO) Take 400 mg by mouth at bedtime.     metformin (FORTAMET) 1000 MG (OSM) 24 hr tablet Take 2 tablets (2,000 mg total) by mouth daily with breakfast. 180 tablet 1   metoprolol succinate (TOPROL-XL) 50 MG 24 hr tablet TAKE 1 TABLET TWICE A DAY WITH OR IMMEDIATELY FOLLOWING A MEAL 180 tablet 3   omeprazole (PRILOSEC) 40 MG capsule Take 1 capsule (40 mg total) by mouth daily before breakfast. 90 capsule 3   rosuvastatin (CRESTOR) 10 MG tablet Take 1 tablet (10 mg total) by mouth at bedtime. 90 tablet 0   Semaglutide,0.25 or 0.5MG/DOS, (OZEMPIC, 0.25 OR 0.5 MG/DOSE,) 2 MG/3ML SOPN Inject 0.67m into skin once weekly for 4 weeks, then increase to 0.559monce weekly 3 mL 3   SKYRIZI, 150 MG DOSE, 75 MG/0.83ML PSKT Inject 150 mg into the skin See admin instructions. Every 3 months     tiZANidine (ZANAFLEX) 4 MG capsule Take 1 capsule (4 mg total) by mouth 3 (three) times daily. 21 capsule 0   Vitamin D, Ergocalciferol, (DRISDOL) 1.25 MG (50000 UNIT) CAPS capsule Take 1 capsule (50,000 Units total) by mouth every 7 (seven) days. 12 capsule 0   No facility-administered medications prior to visit.     Review of Systems:   Constitutional:   No  weight loss, night sweats,  Fevers, chills, fatigue, or  lassitude.  HEENT:   No headaches,  Difficulty swallowing,  Tooth/dental problems, or  Sore throat,                No sneezing, itching, ear ache, nasal congestion, post nasal drip,   CV:  No chest pain,  Orthopnea, PND, swelling in lower extremities, anasarca, dizziness, palpitations, syncope.   GI  No heartburn, indigestion, abdominal pain, nausea, vomiting, diarrhea, change in bowel habits, loss of appetite,  bloody stools.   Resp: No shortness of breath with exertion or at rest.  No excess mucus, no productive cough,  No non-productive cough,  No coughing up of blood.  No change in color of mucus.  No wheezing.  No chest wall deformity  Skin: no rash or lesions.  GU: no dysuria, change in color of urine, no urgency or frequency.  No flank pain, no hematuria   MS:  No joint pain or swelling.  No decreased range of motion.  No back pain.    Physical Exam  BP 134/86 (BP Location: Left Arm, Patient Position: Sitting, Cuff Size: Large)   Pulse 78   Temp 98.3 F (36.8 C) (Oral)   Ht '5\' 11"'  (1.803 m)   Wt (!) 364 lb 6.4 oz (165.3 kg)   SpO2 99%   BMI 50.82 kg/m   GEN: A/Ox3; pleasant , NAD, well nourished    HEENT:  [place]/AT,   NOSE-clear, THROAT-clear, no lesions, no postnasal drip or exudate noted.  Class IV MP airway  NECK:  Supple w/ fair ROM; no JVD; normal carotid impulses w/o bruits; no thyromegaly or nodules palpated; no lymphadenopathy.    RESP  Clear  P & A; w/o, wheezes/ rales/ or rhonchi. no accessory muscle use, no dullness to percussion  CARD:  RRR, no m/r/g, no peripheral edema, pulses intact, no cyanosis or clubbing.  GI:   Soft & nt; nml bowel sounds; no organomegaly or masses detected.   Musco: Warm bil, no deformities or joint swelling noted.   Neuro: alert, no focal deficits noted.    Skin: Warm, no lesions or rashes    Lab Results:  CBC   BMET  No results found for: "BNP"  ProBNP No results found for: "PROBNP"  Imaging:         No data to display          No results found for: "NITRICOXIDE"      Assessment & Plan:   OSA on CPAP Reported excellent compliance on CPAP.  Has perceived benefit on CPAP.  Will order new CPAP- continue current settings and supplies. CPAP download requested.  Patient will bring back his SD card for download.  Plan  Patient Instructions  Continue on CPAP at bedtime goal is to wear for more than 6 hours  each night Bring SD card for CPAP download .   Order for new CPAP.  Work on healthy weight loss Do not drive if sleepy Follow-up with Dr. Halford Chessman in 1 year and As needed        Morbid obesity ([place]) Healthy weight loss discussed     Rexene Edison, NP 11/12/2021

## 2021-11-14 ENCOUNTER — Telehealth: Payer: Self-pay | Admitting: Adult Health

## 2021-11-14 NOTE — Telephone Encounter (Signed)
Nothing further needed 

## 2021-11-16 ENCOUNTER — Encounter: Payer: Self-pay | Admitting: Cardiology

## 2021-11-19 ENCOUNTER — Other Ambulatory Visit: Payer: Self-pay | Admitting: Internal Medicine

## 2021-11-24 ENCOUNTER — Other Ambulatory Visit: Payer: Self-pay | Admitting: Internal Medicine

## 2021-11-28 ENCOUNTER — Other Ambulatory Visit: Payer: Self-pay

## 2021-11-28 MED ORDER — ROSUVASTATIN CALCIUM 10 MG PO TABS: 10.0000 mg | ORAL_TABLET | Freq: Every day | ORAL | 1 refills | Status: DC

## 2021-11-29 IMAGING — CT CT HEART MORP W/ CTA COR W/ SCORE W/ CA W/CM &/OR W/O CM
4 of 7 series · 8 of 20 positions shown, 9 images · IV contrast (APPLIED)
Comparison: CT of the chest on 02/03/2019
COMPARISON: CT of the chest on 02/03/2019

Addendum:
EXAM:
OVER-READ INTERPRETATION  CT CHEST

The following report is an over-read performed by radiologist Dr.
Ferienhaus Erxleben [REDACTED] on 12/04/2020. This
over-read does not include interpretation of cardiac or coronary
anatomy or pathology. The
Interpretation by the cardiologist is attached.
HISTORY: ECG abnormal, 10yr CHD risk 10-20%, not treadmill candidate Atrial
fibrillation/flutter (AF)
Cardiac/Coronary  CT
TECHNIQUE: The patient was scanned on a Siemens Force scanner.
PROTOCOL: A 120 kV prospective scan was triggered in the descending thoracic
aorta at 111 HU's. Axial non-contrast 3 mm slices were carried out
through the heart. The data set was analyzed on a dedicated work
station and scored using the Agatston method. Gantry rotation speed
was 250 msecs and collimation was .6 mm. Beta blockade and 0.8 mg of
sl NTG was given. The 3D data set was reconstructed in 5% intervals
of the 35-75 % of the R-R cycle. Systolic and diastolic phases were
analyzed on a dedicated work station using MPR, MIP and VRT modes.
The patient received 130mL OMNIPAQUE IOHEXOL 350 MG/ML SOLN
contrast.

[Series 6: ts diast sharp · axial · 0.49mm/px · z∈[+1418,+1459]mm · 2 of 306 slices shown]
[im 102/306  lung]
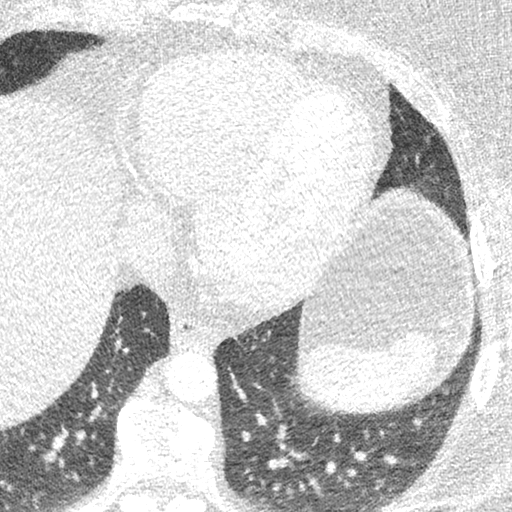
[im 204/306  lung]
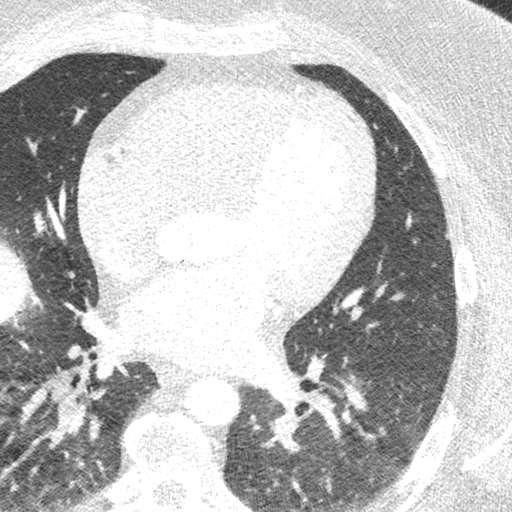

[Series 7: ts syst sharp · axial · 0.49mm/px · z∈[+1418,+1459]mm · 2 of 306 slices shown]
[im 102/306  lung]
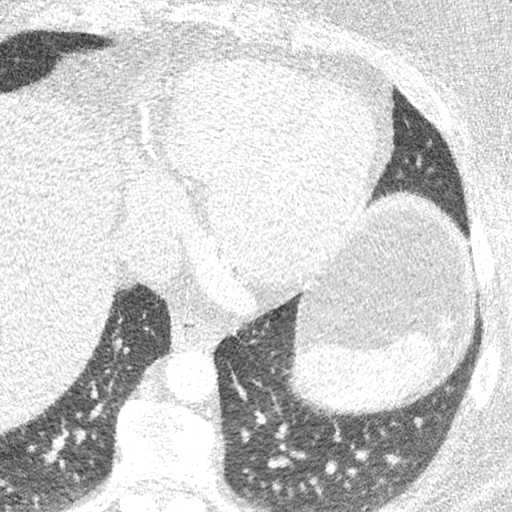
[im 204/306  lung]
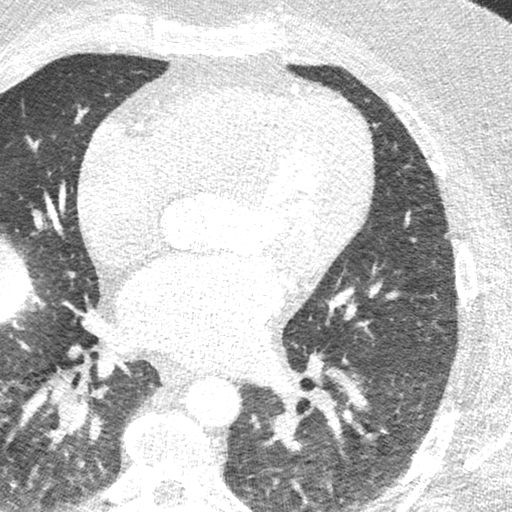

[Series 9: best diast · axial · 0.49mm/px · z∈[+1418,+1459]mm · 2 of 306 slices shown, 3 images]
[im 102/306  vessel]
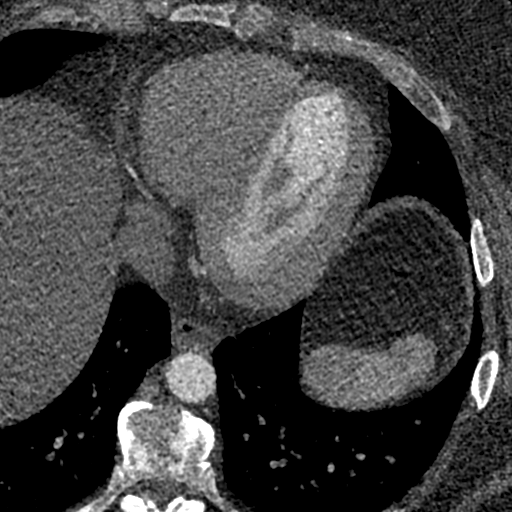
[im 102/306  lung]
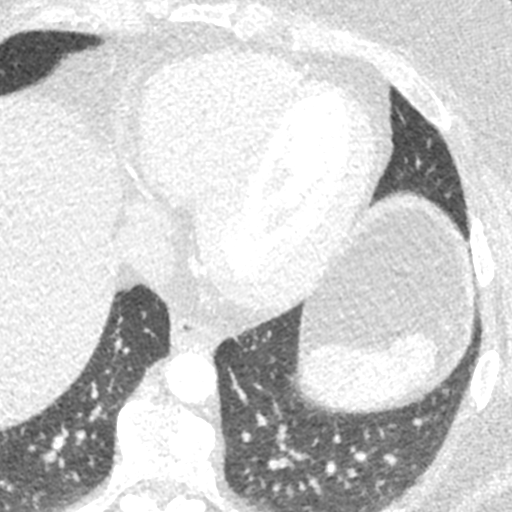
[im 204/306  vessel]
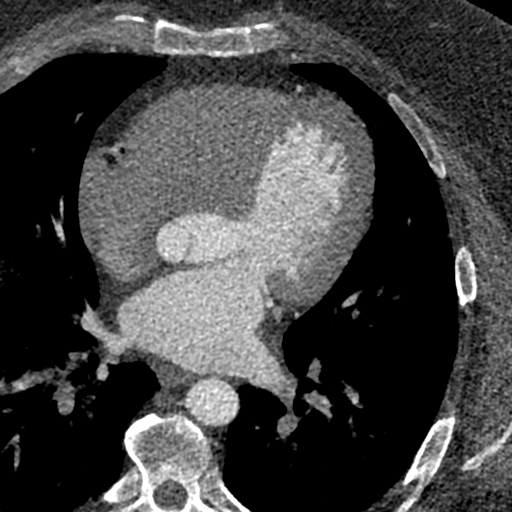

[Series 10: best syst · axial · 0.49mm/px · z∈[+1418,+1459]mm · 2 of 306 slices shown]
[im 102/306  vessel]
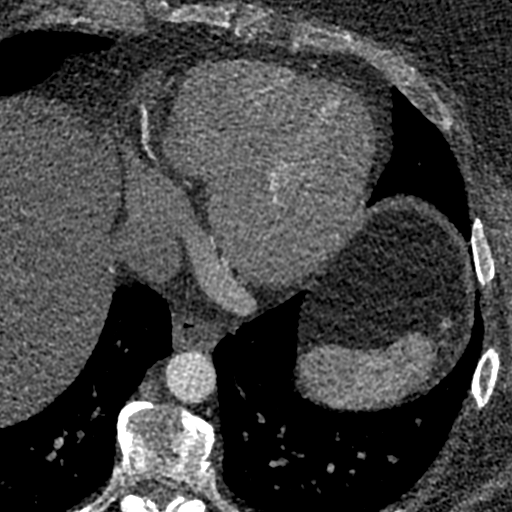
[im 204/306  vessel]
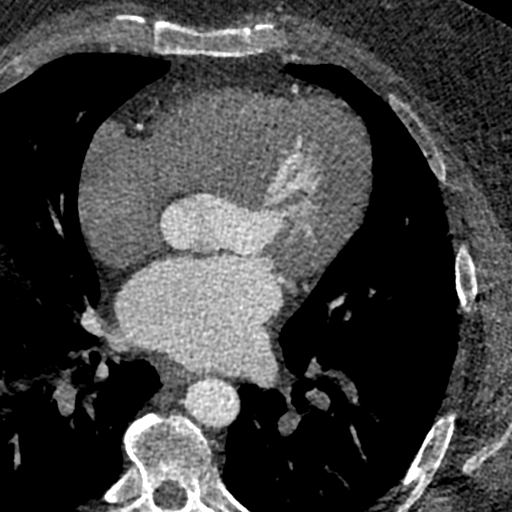

[8 of 20 positions shown; findings below may reference images not displayed]

FINDINGS: Vascular: No significant noncardiac vascular findings.

Mediastinum/Nodes: Visualized mediastinum and hilar regions
demonstrate no lymphadenopathy or masses.

Lungs/Pleura: Stable subpleural/perifissural nodule along the
anterior margin of the minor fissure in the right lung measuring up
to 5 mm. Visualized lungs show no evidence of pulmonary edema,
consolidation, pneumothorax or pleural fluid.

Upper Abdomen: Stable appearance of probable hepatic steatosis.

Musculoskeletal: No chest wall mass or suspicious bone lesions
identified.
IMPRESSION: 1. Stable subpleural/perifissural nodule along the margin of the
right minor fissure which is stable since 7777 and therefore benign.
2. Stable appearance of hepatic steatosis.
FINDINGS: Image quality: Good

Noise artifact is: Limited

Coronary calcium score is 0.

Coronary arteries: Normal coronary origins.  Co-dominance.

Right Coronary Artery: No detectable plaque or stenosis.

Left Main Coronary Artery: No detectable plaque or stenosis.

Left Anterior Descending Coronary Artery: No detectable plaque or
stenosis.

Left Circumflex Artery: No detectable plaque or stenosis.

Aorta: Normal size, 32 mm at the mid ascending aorta (level of the
PA bifurcation) measured double oblique. No calcifications. No
dissection.

Aortic Valve: No calcifications.  Tricuspid aortic valve.

Other findings:

Normal pulmonary vein drainage into the left atrium.

Normal left atrial appendage without thrombus.

Normal size of the pulmonary artery.
IMPRESSION: 1. No evidence of CAD, CADRADS = 0.

2. Coronary calcium score of 0.

3. Normal coronary origins with co-dominance.

*** End of Addendum ***
EXAM:
OVER-READ INTERPRETATION  CT CHEST

The following report is an over-read performed by radiologist Dr.
Ferienhaus Erxleben [REDACTED] on 12/04/2020. This
over-read does not include interpretation of cardiac or coronary
anatomy or pathology. The

Interpretation by the cardiologist is attached.
FINDINGS: Vascular: No significant noncardiac vascular findings.

Mediastinum/Nodes: Visualized mediastinum and hilar regions
demonstrate no lymphadenopathy or masses.

Lungs/Pleura: Stable subpleural/perifissural nodule along the
anterior margin of the minor fissure in the right lung measuring up
to 5 mm. Visualized lungs show no evidence of pulmonary edema,
consolidation, pneumothorax or pleural fluid.

Upper Abdomen: Stable appearance of probable hepatic steatosis.

Musculoskeletal: No chest wall mass or suspicious bone lesions
identified.
IMPRESSION: 1. Stable subpleural/perifissural nodule along the margin of the
right minor fissure which is stable since 7777 and therefore benign.
2. Stable appearance of hepatic steatosis.

## 2021-12-05 ENCOUNTER — Telehealth: Payer: Self-pay | Admitting: Cardiology

## 2021-12-05 NOTE — Telephone Encounter (Signed)
The patient started having palpitations about 24 hours after getting the flu shot. Last year when he got the vaccine he had Afib RVR afterward. His only symptoms are shortness of breath and feeling the palpitations. No missed doses of mediation. He is wanting to make sure this is documented because this happen last year as well. SOB is random. No BP check. HR 83. Rhythm showing on watch is NSR at this time. Patient is worried that this will continue to happen in the future.    Of note:  Vickie Epley, MD    11/20/21 10:29 AM I would recommend the flu vaccine.  CL       11/16/21 10:22 AM Hi Dr. Quentin Ore,   Last year before going into Afib RVR I had received the flu vaccine the day before.   [employer] is wanting me to fill out a medical exemption request based on this information.   Do you think I should get the vaccine or request an exemption?    Thanks,   Julian Washington

## 2021-12-05 NOTE — Telephone Encounter (Signed)
Patient c/o Palpitations:  High priority if patient c/o lightheadedness, shortness of breath, or chest pain  How long have you had palpitations/irregular HR/ Afib? Are you having the symptoms now? Last night around 9pm,  yes intermittent  Are you currently experiencing lightheadedness, SOB or CP? Has intermittent SOB, last night O2 was 99%, does not sound SOB on the phone  Do you have a history of afib (atrial fibrillation) or irregular heart rhythm? yes  Have you checked your BP or HR? (document readings if available): HR 80-100, has not checked BP  Are you experiencing any other symptoms? no   Patient states he has the flu shot Monday and last night he started having intermittent palpitations. He says last year he also started having palpitations around the same time. 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 He says he has also had intermittent SOB, but does not sound SOB on the phone.

## 2021-12-07 ENCOUNTER — Other Ambulatory Visit: Payer: Self-pay | Admitting: Family

## 2021-12-18 ENCOUNTER — Other Ambulatory Visit (HOSPITAL_BASED_OUTPATIENT_CLINIC_OR_DEPARTMENT_OTHER): Payer: Self-pay | Admitting: Family

## 2021-12-18 NOTE — Telephone Encounter (Signed)
Prescription refill request for Eliquis received. Indication:afib Last office visit:7/23 Scr:0.7 Age: 41 Weight:165.3 kg  Prescription refilled

## 2021-12-25 ENCOUNTER — Encounter: Payer: Self-pay | Admitting: Internal Medicine

## 2021-12-25 ENCOUNTER — Ambulatory Visit: Payer: 59 | Admitting: Internal Medicine

## 2021-12-25 ENCOUNTER — Other Ambulatory Visit (HOSPITAL_BASED_OUTPATIENT_CLINIC_OR_DEPARTMENT_OTHER): Payer: Self-pay

## 2021-12-25 VITALS — BP 130/78 | HR 79 | Temp 97.8°F | Resp 18 | Ht 71.0 in | Wt 354.0 lb

## 2021-12-25 DIAGNOSIS — E559 Vitamin D deficiency, unspecified: Secondary | ICD-10-CM

## 2021-12-25 DIAGNOSIS — E538 Deficiency of other specified B group vitamins: Secondary | ICD-10-CM

## 2021-12-25 DIAGNOSIS — E119 Type 2 diabetes mellitus without complications: Secondary | ICD-10-CM

## 2021-12-25 DIAGNOSIS — G43809 Other migraine, not intractable, without status migrainosus: Secondary | ICD-10-CM

## 2021-12-25 MED ORDER — COMIRNATY 30 MCG/0.3ML IM SUSY
PREFILLED_SYRINGE | INTRAMUSCULAR | 0 refills | Status: DC
  Filled 2021-12-25: qty 0.3, 1d supply, fill #0

## 2021-12-25 NOTE — Assessment & Plan Note (Signed)
DM: To see endocrinology soon ADHD, anxiety, depression: - Patient reports his Adderall dose was recently decreased b/c he wondered about headache being a side effect. - He has been extremely anxious lately, starting infertility program, the last time he went through that he was dx w/ a pituitary tumor.  Plans to see a counselor.  Listening therapy provided - Urology is prescribing Cialis is considering testosterone supplementation. Headache: See last visit, CT head negative, it improved, is coming back to some extent lately, we both agreed that it is probably anxiety related. Vitamin deficiency: On ergocalciferol on a  multivitamin for B12 def.  Recheck levels on RTC OSA: Saw pulmonary 11-2021 Preventive care:  The patient noted palpitations after he flu shot and thinks  flu shot triggered his atrial fibrillation. Plans to get a COVID-vaccine. RTC 05-2022 for CPX

## 2021-12-25 NOTE — Patient Instructions (Addendum)
After you finish ergocalciferol weekly, start vitamin D over-the-counter 2000 units every day.  Keep taking multivitamin      GO TO THE FRONT DESK, PLEASE SCHEDULE YOUR APPOINTMENTS Come back for a physical exam by April 2024

## 2021-12-25 NOTE — Progress Notes (Signed)
Subjective:    Patient ID: Julian Washington, male    DOB: 05/15/1980, 41 y.o.   MRN: [id number]  DOS:  12/25/2021 Type of visit - description: f/u  Follow-up, the patient updated me on all his medical problems. He  started a infertility program, saw urology. Very anxious about the whole process.   Review of Systems See above   Past Medical History:  Diagnosis Date   ADHD    Anemia    Anxiety and depression    h/o suicidality w/ citalopram   Atrial fibrillation (HCC)    Diabetes mellitus without complication ([place])    Difficult intubation    ETD (eustachian tube dysfunction)    s/p ENT, declined ear tuves before    GERD (gastroesophageal reflux disease)    History of chicken pox    Titered on 08/16/2005   Hypogonadism male    Dr Cruzita Lederer, used to see urology   Infertility male    Internal hemorrhoids    s/p banding   Low testosterone    Migraine    on topamax   OSA on CPAP    Palpitations    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[place])    h/o increased prolactin, Dr Cruzita Lederer (previously @ [facility])   Prediabetes    A1C 6.2 years ago   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   Vitamin B 12 deficiency    h/o   Vitamin D deficiency     Past Surgical History:  Procedure Laterality Date   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-04-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, ACTH   TONSILLECTOMY AND ADENOIDECTOMY      Current Outpatient Medications  Medication Instructions   ACCU-CHEK FASTCLIX LANCETS MISC 1 Package, Does not apply, 2 times daily   acetaminophen (TYLENOL) 500-1,000 mg, Oral, Every 6 hours PRN   atomoxetine (STRATTERA) 100 mg, Oral, Daily   COVID-19 mRNA vaccine 2023-2024 (COMIRNATY) syringe Intramuscular   Eliquis 5 mg, Oral, 2 times daily   fexofenadine (ALLEGRA) 180 mg, Oral, Daily at bedtime   flecainide (TAMBOCOR) 100 mg, Oral, Every 12 hours   fluticasone (FLONASE) 50 MCG/ACT nasal spray 1 spray, Each Nare, Daily at bedtime   glucose blood (ACCU-CHEK GUIDE) test strip 1 each, Other, 2 times  daily, Use as instructed    losartan (COZAAR) 25 mg, Oral, Daily   Magnesium Oxide (MAG-OXIDE PO) 400 mg, Oral, Daily at bedtime   metformin (FORTAMET) 1,000 mg, Oral, Daily with breakfast   methylphenidate (RITALIN LA) 10 mg, Oral, Daily   metoprolol succinate (TOPROL-XL) 50 MG 24 hr tablet TAKE 1 TABLET TWICE A DAY WITH OR IMMEDIATELY FOLLOWING A MEAL   Multiple Vitamin (MULTIVITAMIN WITH MINERALS) TABS tablet 1 tablet, Oral, Daily   omeprazole (PRILOSEC) 40 mg, Oral, Daily before breakfast   rosuvastatin (CRESTOR) 10 mg, Oral, Daily at bedtime   Semaglutide (1 MG/DOSE) 1 mg, Subcutaneous, Weekly   Skyrizi (150 MG Dose) 150 mg, Subcutaneous, See admin instructions, Every 3 months   tadalafil (CIALIS) 5 mg, Oral, Daily   tiZANidine (ZANAFLEX) 4 mg, Oral, 3 times daily   Vitamin D (Ergocalciferol) (DRISDOL) 50,000 Units, Oral, Every 7 days       Objective:   Physical Exam BP 130/78   Pulse 79   Temp 97.8 F (36.6 C) (Oral)   Resp 18   Ht '5\' 11"'$  (1.803 m)   Wt (!) 354 lb (160.6 kg)   SpO2 96%   BMI 49.37 kg/m  General:   Well developed, NAD,  BMI noted. HEENT:  Normocephalic . Face symmetric, atraumatic Lungs:  CTA B Normal respiratory effort, no intercostal retractions, no accessory muscle use. Heart: RRR,  no murmur.  Lower extremities: Trace pitting edema, skin >> violaceous color noted Skin: Not pale. Not jaundice Neurologic:  alert & oriented X3.  Speech normal, gait appropriate for age and unassisted Psych--  Cognition and judgment appear intact.  Cooperative with normal attention span and concentration.  Anxious appearing.    Assessment     Assessment   DM Chronic venous insufficiency/lymphedema/stasis dermatitis PSYCH: --Anxiety depression (suicidality w/  Citalopram) --ADHD Dx 2019, Dr Johnnye Sima Morbid obesity Vitamin D and  B12 deficiency CV: New onset A. fib 11-2020.  Admitted  Calcium coronary score 0 (11-2020) Endocrinology:Dr  Gherghe --Pituitary mass, surgery : Prolactinoma --Hypogonadism hypogonadotropic --Infertility Migraines, d/c Topamax 05-2015 (pt concerned about short term memory)  OSA on CPAP -- DR Halford Chessman Hematology -Mild chronic anemia, no previous colonoscopy or EGD. Iron , vitamins normal.  saw hematology 09/2017  -Splenomegaly: Per CT 09/20/2017, saw hematology --Abdominal lymphadenopathies: Felt to be stable, see hematology note from July 14, 2019. Pulmonary nodules: Felt to be stable per last CT. DERM: Psoriasis  H/o ET  dysfunction, declined ear tubes H/o hemorrhoids s/p  banding H/o abd pain 2012 (w/u CT, [place], a HIDA scan), similar sx 02-2015 H/o  difficulty intubation    PLAN: DM: To see endocrinology soon ADHD, anxiety, depression: - Patient reports his Adderall dose was recently decreased b/c he wondered about headache being a side effect. - He has been extremely anxious lately, starting infertility program, the last time he went through that he was dx w/ a pituitary tumor.  Plans to see a counselor.  Listening therapy provided - Urology is prescribing Cialis is considering testosterone supplementation. Headache: See last visit, CT head negative, it improved, is coming back to some extent lately, we both agreed that it is probably anxiety related. Vitamin deficiency: On ergocalciferol on a  multivitamin for B12 def.  Recheck levels on RTC OSA: Saw pulmonary 11-2021 Preventive care:  The patient noted palpitations after he flu shot and thinks  flu shot triggered his atrial fibrillation. Plans to get a COVID-vaccine. RTC 05-2022 for CPX

## 2021-12-31 ENCOUNTER — Ambulatory Visit: Payer: 59 | Admitting: Internal Medicine

## 2021-12-31 ENCOUNTER — Encounter: Payer: Self-pay | Admitting: Internal Medicine

## 2021-12-31 VITALS — BP 136/84 | HR 81 | Temp 98.8°F | Resp 18 | Ht 69.69 in | Wt 353.7 lb

## 2021-12-31 DIAGNOSIS — G4733 Obstructive sleep apnea (adult) (pediatric): Secondary | ICD-10-CM | POA: Diagnosis not present

## 2021-12-31 DIAGNOSIS — Z888 Allergy status to other drugs, medicaments and biological substances status: Secondary | ICD-10-CM | POA: Diagnosis not present

## 2021-12-31 DIAGNOSIS — E668 Other obesity: Secondary | ICD-10-CM

## 2021-12-31 DIAGNOSIS — I480 Paroxysmal atrial fibrillation: Secondary | ICD-10-CM

## 2021-12-31 DIAGNOSIS — T50B95A Adverse effect of other viral vaccines, initial encounter: Secondary | ICD-10-CM

## 2021-12-31 NOTE — Progress Notes (Signed)
New Patient Note  RE: JAILAN TRIMM MRN: [id number] DOB: 1980-12-27 Date of Office Visit: 12/31/2021  Consult requested by: Colon Branch, MD Primary care provider: Colon Branch, MD  Chief Complaint: Immunizations (Reaction to flu shot but eats eggs)  History of Present Illness: I had the pleasure of seeing Derek Laughter for initial evaluation at the [facility] [place] on 12/31/2021. He is a 41 y.o. male, who is referred here by Colon Branch, MD for the evaluation of vaccine allergy.  History obtained from patient, chart review and .  Reaction History:  Last year after influenza vaccination he developed syncopal with AFIBB with RVR resulting in 3 day hospital stay.  He had his flu vaccine this year and developed palpitations  Symptoms last 8-12 hours and resolved on their own.   He is on flecinide and metoprolol.    Risk factors for Afibb include OSA, hypertension.   Cardiology does not believe last years influenza vaccine caused his underlying AFIBB, but given recurrent symptoms this year recommend allergy evaluation.  He is due for updated covid booster and is unsure how to proceed.   Oxycodone, hydrocodone cause itching   Levaquin caused lip swelling   SSRI's - suicide ideations.    Assessment and Plan: Fishel is a 41 y.o. male with: Adverse reaction to influenza vaccine, initial encounter  Paroxysmal A-fib (HCC)  OSA (obstructive sleep apnea)  Other obesity Plan: Patient Instructions  Vaccine reaction  -Given delayed symptoms I am not certain this is an IgE mediated vaccine allergy versus adverse effects from influenza vaccine -Given recurrence with repeat immunization this year we should do skin testing to influenza vaccine to better evaluate -Until then would hold off on any other immunizations   Follow up: in 2 weeks for vaccine skin testing   Thank you so much for letting me partake in your care today.  Don't hesitate to reach out if you have any  additional concerns!  Roney Marion, MD  [facility] [place] [place]   No orders of the defined types were placed in this encounter.  Lab Orders  No laboratory test(s) ordered today    Other allergy screening: Asthma: no Rhino conjunctivitis: no Food allergy: no Medication allergy: yes Hymenoptera allergy: no Urticaria: no Eczema:no History of recurrent infections suggestive of immunodeficency: no  Diagnostics: None    Past Medical History: Patient Active Problem List   Diagnosis Date Noted   Dyslipidemia 08/21/2021   Elevated troponin I level    Paroxysmal A-fib ([place]) 12/02/2020   Abnormal finding on lung imaging 06/26/2018   ADHD 02/28/2018   Chronic venous insufficiency of lower extremity 02/28/2018   Psoriasis 02/24/2018   Splenomegaly 09/24/2017   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   Absolute anemia 12/04/2016   HTN (hypertension) 12/04/2016   Type 2 diabetes mellitus without complication, without long-term current use of insulin ([place]) 09/25/2016   Morbid obesity ([place]) 09/25/2016   PCP NOTES >>> 11/15/2014   H/O: pituitary tumor 01/28/2014   Annual physical exam 07/13/2013   Anxiety and depression    Vitamin B 12 deficiency    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[facility])    Migraine    OSA on CPAP    Vitamin D deficiency    Internal hemorrhoids    GERD 06/02/2009   Past Medical History:  Diagnosis Date   ADHD    Anemia    Anxiety and depression    h/o suicidality w/ citalopram  Atrial fibrillation ([place])    Diabetes mellitus without complication ([place])    Difficult intubation    ETD (eustachian tube dysfunction)    s/p ENT, declined ear tuves before    GERD (gastroesophageal reflux disease)    History of chicken pox    Titered on 08/16/2005   Hypogonadism male    Dr Cruzita Lederer, used to see urology   Infertility male    Internal hemorrhoids    s/p banding   Low testosterone    Migraine    on topamax   OSA on CPAP     Palpitations    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[place])    h/o increased prolactin, Dr Cruzita Lederer (previously @ [facility])   Prediabetes    A1C 6.2 years ago   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   Vitamin B 12 deficiency    h/o   Vitamin D deficiency    Past Surgical History: Past Surgical History:  Procedure Laterality Date   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-04-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, ACTH   TONSILLECTOMY AND ADENOIDECTOMY     Medication List:  Current Outpatient Medications  Medication Sig Dispense Refill   ACCU-CHEK FASTCLIX LANCETS MISC 1 Package by Does not apply route 2 (two) times daily. 100 each 0   acetaminophen (TYLENOL) 500 MG tablet Take 500-1,000 mg by mouth every 6 (six) hours as needed for moderate pain or headache.     atomoxetine (STRATTERA) 100 MG capsule Take 100 mg by mouth daily.     ELIQUIS 5 MG TABS tablet TAKE 1 TABLET TWICE A DAY 180 tablet 3   fexofenadine (ALLEGRA) 180 MG tablet Take 180 mg by mouth at bedtime.     flecainide (TAMBOCOR) 100 MG tablet TAKE 1 TABLET EVERY 12 HOURS 180 tablet 3   fluticasone (FLONASE) 50 MCG/ACT nasal spray Place 1 spray into both nostrils at bedtime.     glucose blood (ACCU-CHEK GUIDE) test strip 1 each by Other route 2 (two) times daily. Use as instructed 100 each 0   losartan (COZAAR) 25 MG tablet Take 1 tablet (25 mg total) by mouth daily. 90 tablet 3   Magnesium Oxide (MAG-OXIDE PO) Take 400 mg by mouth at bedtime.     metformin (FORTAMET) 1000 MG (OSM) 24 hr tablet Take 1 tablet (1,000 mg total) by mouth daily with breakfast. 180 tablet 1   methylphenidate (RITALIN LA) 10 MG 24 hr capsule Take 10 mg by mouth daily.     metoprolol succinate (TOPROL-XL) 50 MG 24 hr tablet TAKE 1 TABLET TWICE A DAY WITH OR IMMEDIATELY FOLLOWING A MEAL 180 tablet 3   Multiple Vitamin (MULTIVITAMIN WITH MINERALS) TABS tablet Take 1 tablet by mouth daily.     omeprazole (PRILOSEC) 40 MG capsule Take 1 capsule (40 mg total) by mouth daily before breakfast. 90  capsule 3   rosuvastatin (CRESTOR) 10 MG tablet Take 1 tablet (10 mg total) by mouth at bedtime. 90 tablet 1   Semaglutide, 1 MG/DOSE, 4 MG/3ML SOPN Inject 1 mg into the skin once a week. 3 mL 1   SKYRIZI, 150 MG DOSE, 75 MG/0.83ML PSKT Inject 150 mg into the skin See admin instructions. Every 3 months     tadalafil (CIALIS) 5 MG tablet Take 5 mg by mouth daily.     tiZANidine (ZANAFLEX) 4 MG capsule Take 1 capsule (4 mg total) by mouth 3 (three) times daily. 21 capsule 0   Vitamin D, Ergocalciferol, (DRISDOL) 1.25 MG (50000 UNIT) CAPS  capsule Take 1 capsule (50,000 Units total) by mouth every 7 (seven) days. 12 capsule 0   No current facility-administered medications for this visit.   Allergies: Allergies  Allergen Reactions   Levaquin [Levofloxacin In D5w] Swelling   Celexa [Citalopram] Other (See Comments)    Mental status changes    Dextrans Other (See Comments)    Makes the pt. Drowsy.    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  Tobacco Use   Smoking status: Never   Smokeless tobacco: Never  Vaping Use   Vaping Use: Never used  Substance and Sexual Activity   Alcohol use: Not Currently    Comment: none x 10 yrs   Drug use: No   Sexual activity: Not on file  Other Topics Concern   Not on file  Social History Narrative   Lives w/ wife   Social Determinants of Health   Financial Resource Strain: Not on file  Food Insecurity: Not on file  Transportation Needs: Not on file  Physical Activity: Not on file  Stress: Not on file  Social Connections: Not on file    Lives in a single-family home, there are no roaches in the house and best to get off the floor.  There are no dust mite precautions on better pillows.  He is not exposed to fumes, chemicals or dust.. Smoking: No exposure Occupation: Works at [employer] History: Environmental education officer in the house: no Charity fundraiser in the family room: no Charity fundraiser in the bedroom: yes Heating: electric Cooling: central Pet: no  Family History: Family History  Problem Relation Age of Onset   Obesity Mother    Depression Father    Anxiety disorder Father    Bipolar disorder Father    Drug abuse Father    Cancer Maternal Grandfather        GBM    Diabetes Paternal Grandmother    Hypertension Paternal Grandmother    Diabetes Paternal Grandfather    Cancer Paternal Grandfather        prostate cancer   Prostate cancer Other        GF   CAD Other        PGF   Colon cancer Neg Hx      ROS: All others negative except as noted per HPI.   Objective: BP 136/84   Pulse 81   Temp 98.8 F (37.1 C) (Temporal)   Resp 18   Ht 5' 9.69" (1.77 m)   Wt (!) 353 lb 11.2 oz (160.4 kg)   SpO2 97%   BMI 51.21 kg/m  Body mass index is 51.21 kg/m.  General Appearance:  Alert, cooperative, no distress, appears stated age  Head:  Normocephalic, without obvious abnormality, atraumatic  Eyes:  Conjunctiva clear, EOM's intact  Nose: Nares normal,   Throat: Lips, tongue normal; teeth and gums normal,   Neck: Supple, symmetrical  Lungs:   , Respirations unlabored, no coughing  Heart:  , Appears well perfused  Extremities: No edema  Skin: Skin  color, texture, turgor normal, no rashes or lesions on visualized portions of skin  Neurologic: No gross deficits   The plan was reviewed with the patient/family, and all questions/concerned were addressed.  It was my pleasure to see Kaedan today and participate in his care. Please feel free to contact me with any questions or concerns.  Sincerely,  Roney Marion, MD Allergy & Immunology  [facility] office: [phone] [facility] office: [phone]

## 2021-12-31 NOTE — Patient Instructions (Signed)
Vaccine reaction  -Given delayed symptoms I am not certain this is an IgE mediated vaccine allergy versus adverse effects from influenza vaccine -Given recurrence with repeat immunization this year we should do skin testing to influenza vaccine to better evaluate -Until then would hold off on any other immunizations   Follow up: in 2 weeks for vaccine skin testing   Thank you so much for letting me partake in your care today.  Don't hesitate to reach out if you have any additional concerns!  Roney Marion, MD  [facility] [place], [place]

## 2022-01-15 ENCOUNTER — Other Ambulatory Visit: Payer: Self-pay | Admitting: Internal Medicine

## 2022-01-19 ENCOUNTER — Encounter: Payer: Self-pay | Admitting: Internal Medicine

## 2022-01-21 ENCOUNTER — Encounter: Payer: Self-pay | Admitting: Internal Medicine

## 2022-01-21 ENCOUNTER — Ambulatory Visit: Payer: 59 | Admitting: Internal Medicine

## 2022-01-21 VITALS — BP 146/86 | HR 76 | Temp 97.6°F | Resp 18 | Wt 353.9 lb

## 2022-01-21 DIAGNOSIS — I480 Paroxysmal atrial fibrillation: Secondary | ICD-10-CM

## 2022-01-21 DIAGNOSIS — Z888 Allergy status to other drugs, medicaments and biological substances status: Secondary | ICD-10-CM | POA: Diagnosis not present

## 2022-01-21 DIAGNOSIS — R002 Palpitations: Secondary | ICD-10-CM

## 2022-01-21 MED ORDER — OZEMPIC (1 MG/DOSE) 4 MG/3ML ~~LOC~~ SOPN: 1.0000 mg | PEN_INJECTOR | SUBCUTANEOUS | 1 refills | Status: DC

## 2022-01-21 MED ORDER — FAMOTIDINE 20 MG PO TABS: 20.0000 mg | ORAL_TABLET | Freq: Two times a day (BID) | ORAL | 0 refills | Status: DC

## 2022-01-21 MED ORDER — CETIRIZINE HCL 10 MG PO TABS: 10.0000 mg | ORAL_TABLET | Freq: Every day | ORAL | 0 refills | Status: DC

## 2022-01-21 MED ORDER — EPINEPHRINE 0.3 MG/0.3ML IJ SOAJ: 0.3000 mg | INTRAMUSCULAR | 1 refills | Status: DC | PRN

## 2022-01-21 NOTE — Progress Notes (Signed)
Follow Up Note  RE: Julian Washington MRN: [id number] DOB: 1981-02-03 Date of Office Visit: 01/21/2022  Referring provider: Colon Branch, MD Primary care provider: Colon Branch, MD  Chief Complaint: Other (Pt is present today for skin testing to the Flu vaccine.)  History of Present Illness: I had the pleasure of seeing Julian Washington for a follow up visit at the [facility] [place] [facility] [place] on 01/21/2022. He is a 41 y.o. male, who is being followed for concern for reaction to flu vaccine. His previous allergy office visit was on 12/31/2021 with Dr. Edison Pace. Today is a skin testing and follow up visit.  History obtained from patient, chart review.  He returns today for skin testing to afluria influenza vaccine   Reaction History:  Last year after influenza vaccination he developed syncopal with AFIBB with RVR resulting in 3 day hospital stay.  He had his flu vaccine this year and developed palpitations  Symptoms last 8-12 hours and resolved on their own.   He is on flecinide and metoprolol.    Risk factors for Afibb include OSA, hypertension.   Cardiology does not believe last years influenza vaccine caused his underlying AFIBB, but given recurrent symptoms this year recommend allergy evaluation.  He is due for updated covid booster and is unsure how to proceed  Assessment and Plan: Julian Washington is a 41 y.o. male with: Anaphylaxis, subsequent encounter  Palpitations  Paroxysmal A-fib (Julian Washington)  Morbid obesity (Julian Washington) Plan: Patient Instructions  Vaccine reaction  - Intradermal Testing positive to Afluria vaccine today  - Atypical presentation, but given testing recommend avoidance of afluria influenza vaccines -For COVID booster recommend starting Zyrtec 10 mg daily, famotidine 20 mg twice a day for 7 days and will do a graded dose challenge in clinic -Please discuss today's vaccine testing with cardiology - Epipen prescribed and allergy action plan given  - Please bring epipen on covid  booster challenge appointment   Follow up: in 1 week for COVID booster via graded dose challenge   Thank you so much for letting me partake in your care today.  Don't hesitate to reach out if you have any additional concerns!  Roney Marion, MD  [facility] [place] [place] No follow-ups on file.  Meds ordered this encounter  Medications   cetirizine (ZYRTEC) 10 MG tablet    Sig: Take 1 tablet (10 mg total) by mouth daily.    Dispense:  30 tablet    Refill:  0   famotidine (PEPCID) 20 MG tablet    Sig: Take 1 tablet (20 mg total) by mouth 2 (two) times daily.    Dispense:  60 tablet    Refill:  0   EPINEPHrine (EPIPEN 2-PAK) 0.3 mg/0.3 mL IJ SOAJ injection    Sig: Inject 0.3 mg into the muscle as needed.    Dispense:  1 each    Refill:  1    Lab Orders  No laboratory test(s) ordered today   Diagnostics:  Skin Testing:  Afluria vaccine  adequate controls: control: 0, Histamine 4+ Full strength skin prick testing was negative.   Intradermal 1:100 dilution was 4+ with adeqaute negative intradermal control  Results interpreted by myself during this encounter and discussed with patient/family.   Medication List:  Current Outpatient Medications  Medication Sig Dispense Refill   augmented betamethasone dipropionate (DIPROLENE-AF) 0.05 % cream SMARTSIG:1 sparingly Topical Twice Daily     cetirizine (ZYRTEC) 10 MG tablet Take 1 tablet (10  mg total) by mouth daily. 30 tablet 0   EPINEPHrine (EPIPEN 2-PAK) 0.3 mg/0.3 mL IJ SOAJ injection Inject 0.3 mg into the muscle as needed. 1 each 1   famotidine (PEPCID) 20 MG tablet Take 1 tablet (20 mg total) by mouth 2 (two) times daily. 60 tablet 0   PREGNYL 71696 units injection Inject into the muscle.     sodium bicarbonate 650 MG tablet Take 650 mg by mouth every 6 (six) hours.     ACCU-CHEK FASTCLIX LANCETS MISC 1 Package by Does not apply route 2 (two) times daily. 100 each 0   acetaminophen (TYLENOL) 500 MG tablet  Take 500-1,000 mg by mouth every 6 (six) hours as needed for moderate pain or headache.     atomoxetine (STRATTERA) 100 MG capsule Take 100 mg by mouth daily.     ELIQUIS 5 MG TABS tablet TAKE 1 TABLET TWICE A DAY 180 tablet 3   flecainide (TAMBOCOR) 100 MG tablet TAKE 1 TABLET EVERY 12 HOURS 180 tablet 3   fluticasone (FLONASE) 50 MCG/ACT nasal spray Place 1 spray into both nostrils at bedtime.     glucose blood (ACCU-CHEK GUIDE) test strip 1 each by Other route 2 (two) times daily. Use as instructed 100 each 0   losartan (COZAAR) 25 MG tablet Take 1 tablet (25 mg total) by mouth daily. 90 tablet 3   Magnesium Oxide (MAG-OXIDE PO) Take 400 mg by mouth at bedtime.     metformin (FORTAMET) 1000 MG (OSM) 24 hr tablet Take 1 tablet (1,000 mg total) by mouth daily with breakfast. 180 tablet 1   methylphenidate (RITALIN LA) 10 MG 24 hr capsule Take 10 mg by mouth daily.     metoprolol succinate (TOPROL-XL) 50 MG 24 hr tablet TAKE 1 TABLET TWICE A DAY WITH OR IMMEDIATELY FOLLOWING A MEAL 180 tablet 3   Multiple Vitamin (MULTIVITAMIN WITH MINERALS) TABS tablet Take 1 tablet by mouth daily.     omeprazole (PRILOSEC) 40 MG capsule Take 1 capsule (40 mg total) by mouth daily before breakfast. 90 capsule 3   rosuvastatin (CRESTOR) 10 MG tablet Take 1 tablet (10 mg total) by mouth at bedtime. 90 tablet 1   Semaglutide, 1 MG/DOSE, (OZEMPIC, 1 MG/DOSE,) 4 MG/3ML SOPN Inject 1 mg into the skin once a week. 9 mL 1   SKYRIZI, 150 MG DOSE, 75 MG/0.83ML PSKT Inject 150 mg into the skin See admin instructions. Every 3 months     tadalafil (CIALIS) 5 MG tablet Take 5 mg by mouth daily.     tiZANidine (ZANAFLEX) 4 MG capsule Take 1 capsule (4 mg total) by mouth 3 (three) times daily. 21 capsule 0   Vitamin D, Ergocalciferol, (DRISDOL) 1.25 MG (50000 UNIT) CAPS capsule Take 1 capsule (50,000 Units total) by mouth every 7 (seven) days. 12 capsule 0   No current facility-administered medications for this visit.    Allergies: Allergies  Allergen Reactions   Levaquin [Levofloxacin In D5w] Swelling   Celexa [Citalopram] Other (See Comments)    Mental status changes    Dextrans Other (See Comments)    Makes the pt. Drowsy.    Hydrocodone-Acetaminophen     REACTION: itching   Latex    Oxycodone Itching   Shiitake Mushroom Rash   I reviewed his past medical history, social history, family history, and environmental history and no significant changes have been reported from his previous visit.  ROS: All others negative except as noted per HPI.   Objective: BP Mar[place] Kitchen)  146/86   Pulse 76   Temp 97.6 F (36.4 C) (Temporal)   Resp 18   Wt (!) 353 lb 14.4 oz (160.5 kg)   SpO2 100%   BMI 51.24 kg/m  Body mass index is 51.24 kg/m. General Appearance:  Alert, cooperative, no distress, appears stated age  Head:  Normocephalic, without obvious abnormality, atraumatic  Eyes:  Conjunctiva clear, EOM's intact  Nose: Nares normal,   Throat: Lips, tongue normal; teeth and gums normal,   Neck: Supple, symmetrical  Lungs:   clear to auscultation bilaterally, Respirations unlabored, no coughing  Heart:  regular rate and rhythm and no murmur, Appears well perfused  Extremities: No edema  Skin: Skin color, texture, turgor normal, no rashes or lesions on visualized portions of skin   Neurologic: No gross deficits   Previous notes and tests were reviewed. The plan was reviewed with the patient/family, and all questions/concerned were addressed.  It was my pleasure to see Suhan today and participate in his care. Please feel free to contact me with any questions or concerns.  Sincerely,  Roney Marion, MD  Allergy & Immunology  [facility] [place] [facility] Office: [phone]

## 2022-01-21 NOTE — Patient Instructions (Addendum)
Vaccine reaction  - Intradermal Testing positive to Afluria vaccine today  - Atypical presentation, but given testing recommend avoidance of afluria influenza vaccines -For COVID booster recommend starting Zyrtec 10 mg daily, famotidine 20 mg twice a day for 7 days and will do a graded dose challenge in clinic -Please discuss today's vaccine testing with cardiology - Epipen prescribed and allergy action plan given  - Please bring epipen on covid booster challenge appointment   Follow up: in 1 week for COVID booster via graded dose challenge   Thank you so much for letting me partake in your care today.  Don't hesitate to reach out if you have any additional concerns!  Roney Marion, MD  [facility] [place], [place]

## 2022-01-24 ENCOUNTER — Ambulatory Visit: Payer: 59 | Admitting: Internal Medicine

## 2022-01-24 ENCOUNTER — Encounter: Payer: Self-pay | Admitting: Internal Medicine

## 2022-01-24 VITALS — BP 140/94 | HR 75 | Ht 69.69 in | Wt 352.6 lb

## 2022-01-24 DIAGNOSIS — Z87898 Personal history of other specified conditions: Secondary | ICD-10-CM | POA: Diagnosis not present

## 2022-01-24 DIAGNOSIS — E230 Hypopituitarism: Secondary | ICD-10-CM

## 2022-01-24 LAB — T3, FREE: T3, Free: 3.5 pg/mL (ref 2.3–4.2)

## 2022-01-24 LAB — CORTISOL: Cortisol, Plasma: 8.1 ug/dL

## 2022-01-24 LAB — T4, FREE: Free T4: 0.73 ng/dL (ref 0.60–1.60)

## 2022-01-24 LAB — TSH: TSH: 2.36 u[IU]/mL (ref 0.35–5.50)

## 2022-01-24 LAB — HEMOGLOBIN A1C: Hgb A1c MFr Bld: 5.9 % (ref 4.6–6.5)

## 2022-01-24 NOTE — Progress Notes (Signed)
Patient ID: Julian Washington, male   DOB: 11-10-1980, 41 y.o.   MRN: [id number]  HPI: Julian Washington is a 41 y.o.-year-old man, presenting for f/u for hypogonadotropic hypogonadism, history of pituitary adenoma - s/p TSR, and DM2, non-insulin-dependent.  I previously saw the patient, last visit was 3 years and 2 months ago.  His wife, Julian Washington, is also my patient.  Interim history: He and his wife are undergoing fertility treatment.  He feels very stressed (increased stress, also PTSD from when he was intubated for his surgery in 2014).  In the past, despite a  low testosterone level, we did not start testosterone, in preparation for a possible pregnancy. He continues to see a fertility specialist.  He had recent evaluation with testosterone, LH, [facility].  He was recommended to start beta-hCG.  He will do so tomorrow. He has retrograde ejaculation.  He is seeing urology. No increased urination, blurry vision, nausea, chest pain. At today's visit, he does not report headaches or visual disturbances.  He had headaches in 10/2021, for which he had a CT scan -no acute abnormalities. He has palpitations after flu vaccine, last year he had Afib with RVR after this. He now has an Epi pen.  History of pituitary adenoma: Reviewed and addended history: The patient has been diagnosed with hypogonadotropic hypogonadism ~2012, after trying to achieve a pregnancy. He tried testosterone replacement and clomiphene (which did not work), a cousin who is a medical resident suggested to have further investigation to find out the cause for his hypogonadism.   His urologist at that time ordered a pituitary MRI and he has been found to have a pituitary microadenoma on MRI in 12/2011. He was referred to neurosurgery - Julian Washington - and he ended up having TSR in 02/2012. Surgical pathology showed:  The sections show monomorphic nests of neoplastic cells with neuroendocrine nuclear features, consistent with a  pituitary adenoma. The tumor is strongly and diffusely immunoreactive for prolactin (100% of cells). There is patchy staining for adrenocorticotropic hormone (less than 5% of cells). The cells are negative for TSH, HGH, FSH, and beta-LH.   His highest prolactin level was 44.7 before the surgery. This has decreased after the surgery.  I reviewed patient's pertinent labs per records from  Julian. Ladonna Washington at [facility] and also in Care Everywhere: 11/2010: TSH 2 02/2011: Testosterone 0.89, free testosterone 2.2 (9.3-26.5) 11/17/2011: TSH 2.35, FSH 2.08, LH 1.44, testosterone 0.08 (2.41-8.27) 12/17/2011: Prolactin 44.7, estradiol 23, cortisol 2.9, hemoglobin A1c 6.2% 02/19/2012: TSH 1.9, total T3 106, free T4 1, hemoglobin A1c 5.8% 03/05/2012: Prolactin 1.4, cortisol 37.2, hemoglobin A1c 5.6%  04/02/2012: Prolactin 3.2, testosterone 79 ([phone]), hemoglobin A1c 6.1%  06/19/2012: Total testosterone 119 ([phone]), free testosterone 23.5 (40-224), SHBG 17 (10-50), LH  3.4, FSH 4.7, ACTH 20, cortisol 6.5, TSH 3.072, free T4 0.9 09/23/2012: Total testosterone 115, free testosterone 20.5, SHBG 20 04/02/2013: Total testosterone 76, free testosterone 15.6, SHBG 15  04/26/2013: TSH 2.34, free T4 0.9  Pituitary MRI 12/28/2011: Heterogeneous signal intensity lesion centered in the anterior aspect of the sella measures approximately 1 cm in maximum dimension and demonstrates a fluid hematocrit level with some intrinsic T1 bright/T2 dark signal along its caudal margin consistent with subacute hemorrhage. There is no mass effect on the optic chiasm or evidence of cavernous sinus invasion.  Pituitary MRI 09/2012: Right lateral sellar soft tissue, likely residual pituitary 0.6 x 0.4 x 0.9 cm, homogeneous postcontrast enhancement, abuts medial aspect of the right cavernous  ICA. No mass effect.  Pituitary MRI (10/05/2014): No residual tumor.  Pituitary MRI (09/25/2017): Postoperative changes from partial  pituitary resection. No recurrent tumor seen.  No change compared with 09/28/2014  Head CT (10/29/2021): Performed due to headaches in the setting of chronic blood thinner use: no acute abnormalities; pituitary gland was not characterized.   He has an MRI pending at [facility].  He has claustrophobia so the MRI machine has to be completely open. . Hypogonadotropic hypogonadism:  Reviewed history: Diagnosed when he and his wife were trying to get pregnant. They are preparing for IVF at [facility] >> wife's diabetes is not under control and he needs to be between 6 and 7% for the procedure. They are seeing reproductive endocrinology: Julian Robbie Louis, MD.  He started to have low T sxs 2011-2012.  He was previously on Testosterone: axillar gel (did not like it as he was always fearing that he can transfer this to his wife) and inj (200 mg 2x a month). On this >> T level normalized, however he cannot do the injections himself. Clomid did not help (3 mo) >> lost capacity to ejaculate and got hot flushes when stopped.   He admits for decreased libido No difficulty obtaining but has problems maintaining an erection No trauma to testes, testicular irradiation or surgery No h/o of mumps orchitis/h/o autoimmune ds. No h/o cryptorchidism He grew and went through puberty like his peers No shrinking of testes. + small testes (<5 ml) No incomplete/delayed sexual development     No breast discomfort/gynecomastia    No loss of body hair (axillary/pubic)/decreased need for shaving No height loss No abnormal sense of smell (only allergies) No hot flushes No vision problems No worst HA of his life, but had headaches No FH of hypogonadism/infertility  No personal h/o infertility -but under investigation No FH of hemochromatosis or pituitary tumors No excessive weight gain or loss.  No chronic diseases No chronic pain. Not on opiates, does not take steroids.  No more than 2 drinks a day of  alcohol at a time, and this is rarely No anabolic steroids use No herbal medicines Not on antidepressants  No AI ds in his family, no FH of MS.  He does not have family history of early cardiac disease.  Grandfather had a MI at 76 y/o.  Reviewed his pituitary labs from our previous visits: Low testosterone/LH/FSH, and also low IGF-I: Component     Latest Ref Rng & Units 04/30/2018  Testosterone, Serum (Total)     ng/dL 33 (L)  % Free Testosterone     % 2.2  Free Testosterone, S     pg/mL 7.3 (L)  Sex Hormone Binding Globulin     nmol/L 21.4  IGF-I, LC/MS     53 - 331 ng/mL 49 (L)  Z-Score (Male)     -2.0 - 2 SD -2.1 (L)  FSH     1.4 - 18.1 mIU/ML 4.2  LH     1.50 - 9.30 mIU/mL 2.43  Cortisol, Plasma     ug/dL 7.5  C206 ACTH     6 - 50 pg/mL 20  Prolactin     2.0 - 18.0 ng/mL 5.7  TSH     0.35 - 4.50 uIU/mL 2.71  T4,Free(Direct)     0.60 - 1.60 ng/dL 0.87  Triiodothyronine,Free,Serum     2.3 - 4.2 pg/mL 4.0    Component     Latest Ref Rng & Units 02/10/2017  Testosterone, Serum (  Total)     264-916 ng/dL 46 (L)  % Free Testosterone     % 1.4  Free Testosterone, S     52-280 pg/mL  6.4 (L)  Sex Hormone Binding Globulin     nmol/L 25.9  IGF-I, LC/MS     53 - 331 ng/mL 64  Z-Score (Male)     -2.0 - 2 SD -1.6  FSH     1.4 - 18.1 mIU/ML 3.4  LH     1.50 - 9.30 mIU/mL 2.65  Cortisol, Plasma     ug/dL 5.0  C206 ACTH     6 - 50 pg/mL 28  Prolactin     2.0 - 18.0 ng/mL 3.3  TSH     0.35 - 4.50 uIU/mL 2.71  T4,Free(Direct)     0.60 - 1.60 ng/dL 0.78  Triiodothyronine,Free,Serum     2.3 - 4.2 pg/mL 3.1    Component     Latest Ref Rng & Units 01/10/2016  Testosterone     264 - 916 ng/dL 29 (L)  Testosterone Free     8.7 - 25.1 pg/mL 1.6 (L)  Sex Horm Binding Glob, Serum     16.5 - 55.9 nmol/L 22.8  IGF-I, LC/MS     53 - 331 ng/mL 61  Z-Score (Male)     -2.0 - 2.0 SD -1.7  FSH     1.4 - 18.1 mIU/ML 3.7  LH     1.50 - 9.30 mIU/mL 2.76   T4,Free(Direct)     0.60 - 1.60 ng/dL 0.79  Triiodothyronine,Free,Serum     2.3 - 4.2 pg/mL 3.6  Cortisol, Plasma     ug/dL 4.8  C206 ACTH     6 - 50 pg/mL 18  TSH     0.35 - 4.50 uIU/mL 1.27  Prolactin     2.0 - 18.0 ng/mL 4.2   In preparation for pregnancy, is starting on hCG injections tomorrow.  DM2: -Managed by PCP. He previously had prediabetes but was diagnosed with diabetes in 10/2018: Lab Results  Component Value Date   HGBA1C 6.2 05/21/2021   HGBA1C 6.2 (H) 12/02/2020   HGBA1C 6.8 (H) 07/28/2020   HGBA1C 6.5 01/26/2020   HGBA1C 6.5 11/04/2018   HGBA1C 6.9 (H) 02/27/2018   HGBA1C 5.5 11/20/2016   HGBA1C 6.2 (H) 07/11/2016   HGBA1C 5.7 01/10/2016   HGBA1C 5.8 07/06/2015   HGBA1C 6.2 01/25/2015   HGBA1C 5.9 07/08/2014   No CKD:   Chemistry      Component Value Date/Time   NA 141 10/02/2021 0744   NA 141 11/20/2016 0811   K 4.3 10/02/2021 0744   CL 105 10/02/2021 0744   CO2 25 10/02/2021 0744   BUN 13 10/02/2021 0744   BUN 18 11/20/2016 0811   CREATININE 0.79 10/02/2021 0744   CREATININE 0.75 07/09/2019 0942   CREATININE 0.73 01/19/2016 1624      Component Value Date/Time   CALCIUM 9.0 10/02/2021 0744   ALKPHOS 55 05/21/2021 1544   AST 13 10/02/2021 0735   AST 16 07/09/2019 0942   ALT 15 10/02/2021 0735   ALT 25 07/09/2019 0942   BILITOT 0.4 05/21/2021 1544   BILITOT 0.5 07/09/2019 0942     + History of dyslipidemia: Lab Results  Component Value Date   CHOL 87 10/02/2021   HDL 40.00 10/02/2021   LDLCALC 33 10/02/2021   TRIG 72.0 10/02/2021   CHOLHDL 2 10/02/2021  On Crestor 10 mg daily.  Last eye exam: 03/06/2021: No  Julian.  She sees podiatry.  Last foot exam 06/05/2021 - Julian. Blenda Mounts.  She is on: - Ozempic 1 mg weekly He was previously on metformin ER, Victoza, and Saxenda.   Lab Results  Component Value Date   HGBA1C 6.2 05/21/2021   HGBA1C 6.2 (H) 12/02/2020   HGBA1C 6.8 (H) 07/28/2020   HGBA1C 6.5 01/26/2020   HGBA1C 6.5  11/04/2018   Obesity: He tried weight watchers in the past. Strattera helps controlling his appetite.  Previously on Concerta.  He is currently on Ozempic.  He also has history of B12 deficiency and iron deficiency anemia. He was diagnosed with OSA in 2011.  He uses a CPAP machine. Previously working nights, but now works days.  ROS: + See HPI  I reviewed pt's medications, allergies, PMH, social hx, family hx, and changes were documented in the history of present illness. Otherwise, unchanged from my initial visit note.  Past Medical History:  Diagnosis Date   ADHD    Anemia    Anxiety and depression    h/o suicidality w/ citalopram   Atrial fibrillation (HCC)    Diabetes mellitus without complication ([place])    Difficult intubation    ETD (eustachian tube dysfunction)    s/p ENT, declined ear tuves before    GERD (gastroesophageal reflux disease)    History of chicken pox    Titered on 08/16/2005   Hypogonadism male    Julian Cruzita Lederer, used to see urology   Infertility male    Internal hemorrhoids    s/p banding   Low testosterone    Migraine    on topamax   OSA on CPAP    Palpitations    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[place])    h/o increased prolactin, Julian Cruzita Lederer (previously @ [facility])   Prediabetes    A1C 6.2 years ago   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   Vitamin B 12 deficiency    h/o   Vitamin D deficiency    Past Surgical History:  Procedure Laterality Date   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-04-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, ACTH   TONSILLECTOMY AND ADENOIDECTOMY     Social History   Socioeconomic History   Marital status: Married    Spouse name: Not on file   Number of children: 0   Years of education: Not on file   Highest education level: Not on file  Occupational History   Occupation: Therapist, sports, 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    Employer: [employer]   Occupation: finish Restaurant manager, 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  Tobacco Use   Smoking status: Never   Smokeless tobacco: Never  Vaping Use    Vaping Use: Never used  Substance and Sexual Activity   Alcohol use: Not Currently    Comment: none x 10 yrs   Drug use: No   Sexual activity: Not on file  Other Topics Concern   Not on file  Social History Narrative   Lives w/ wife   Social Determinants of Health   Financial Resource Strain: Not on file  Food Insecurity: Not on file  Transportation Needs: Not on file  Physical Activity: Not on file  Stress: Not on file  Social Connections: Not on file  Intimate Partner Violence: Not on file   Current Outpatient Medications on File Prior to Visit  Medication Sig Dispense Refill   ACCU-CHEK FASTCLIX LANCETS MISC 1 Package by Does not apply route 2 (two) times daily. 100 each 0   acetaminophen (TYLENOL) 500 MG  tablet Take 500-1,000 mg by mouth every 6 (six) hours as needed for moderate pain or headache.     atomoxetine (STRATTERA) 100 MG capsule Take 100 mg by mouth daily.     augmented betamethasone dipropionate (DIPROLENE-AF) 0.05 % cream SMARTSIG:1 sparingly Topical Twice Daily     cetirizine (ZYRTEC) 10 MG tablet Take 1 tablet (10 mg total) by mouth daily. 30 tablet 0   ELIQUIS 5 MG TABS tablet TAKE 1 TABLET TWICE A DAY 180 tablet 3   EPINEPHrine (EPIPEN 2-PAK) 0.3 mg/0.3 mL IJ SOAJ injection Inject 0.3 mg into the muscle as needed. 1 each 1   famotidine (PEPCID) 20 MG tablet Take 1 tablet (20 mg total) by mouth 2 (two) times daily. 60 tablet 0   flecainide (TAMBOCOR) 100 MG tablet TAKE 1 TABLET EVERY 12 HOURS 180 tablet 3   fluticasone (FLONASE) 50 MCG/ACT nasal spray Place 1 spray into both nostrils at bedtime.     glucose blood (ACCU-CHEK GUIDE) test strip 1 each by Other route 2 (two) times daily. Use as instructed 100 each 0   losartan (COZAAR) 25 MG tablet Take 1 tablet (25 mg total) by mouth daily. 90 tablet 3   Magnesium Oxide (MAG-OXIDE PO) Take 400 mg by mouth at bedtime.     metformin (FORTAMET) 1000 MG (OSM) 24 hr tablet Take 1 tablet (1,000 mg total) by mouth  daily with breakfast. 180 tablet 1   methylphenidate (RITALIN LA) 10 MG 24 hr capsule Take 10 mg by mouth daily.     metoprolol succinate (TOPROL-XL) 50 MG 24 hr tablet TAKE 1 TABLET TWICE A DAY WITH OR IMMEDIATELY FOLLOWING A MEAL 180 tablet 3   Multiple Vitamin (MULTIVITAMIN WITH MINERALS) TABS tablet Take 1 tablet by mouth daily.     omeprazole (PRILOSEC) 40 MG capsule Take 1 capsule (40 mg total) by mouth daily before breakfast. 90 capsule 3   PREGNYL 78469 units injection Inject into the muscle.     rosuvastatin (CRESTOR) 10 MG tablet Take 1 tablet (10 mg total) by mouth at bedtime. 90 tablet 1   Semaglutide, 1 MG/DOSE, (OZEMPIC, 1 MG/DOSE,) 4 MG/3ML SOPN Inject 1 mg into the skin once a week. 9 mL 1   SKYRIZI, 150 MG DOSE, 75 MG/0.83ML PSKT Inject 150 mg into the skin See admin instructions. Every 3 months     sodium bicarbonate 650 MG tablet Take 650 mg by mouth every 6 (six) hours.     tadalafil (CIALIS) 5 MG tablet Take 5 mg by mouth daily.     tiZANidine (ZANAFLEX) 4 MG capsule Take 1 capsule (4 mg total) by mouth 3 (three) times daily. 21 capsule 0   Vitamin D, Ergocalciferol, (DRISDOL) 1.25 MG (50000 UNIT) CAPS capsule Take 1 capsule (50,000 Units total) by mouth every 7 (seven) days. 12 capsule 0   No current facility-administered medications on file prior to visit.   Allergies  Allergen Reactions   Levaquin [Levofloxacin In D5w] Swelling   Celexa [Citalopram] Other (See Comments)    Mental status changes    Dextrans Other (See Comments)    Makes the pt. Drowsy.    Hydrocodone-Acetaminophen     REACTION: itching   Latex    Oxycodone Itching   Shiitake Mushroom Rash   Family History  Problem Relation Age of Onset   Obesity Mother    Depression Father    Anxiety disorder Father    Bipolar disorder Father    Drug abuse Father  Cancer Maternal Grandfather        GBM    Diabetes Paternal Grandmother    Hypertension Paternal Grandmother    Diabetes Paternal  Grandfather    Cancer Paternal Grandfather        prostate cancer   Prostate cancer Other        GF   CAD Other        PGF   Colon cancer Neg Hx    PE: BP (!) 140/94 (BP Location: Right Arm, Patient Position: Sitting, Cuff Size: Normal)   Pulse 75   Ht 5' 9.69" (1.77 m)   Wt (!) 352 lb 9.6 oz (159.9 kg)   SpO2 99%   BMI 51.04 kg/m   Wt Readings from Last 3 Encounters:  01/24/22 (!) 352 lb 9.6 oz (159.9 kg)  01/21/22 (!) 353 lb 14.4 oz (160.5 kg)  12/31/21 (!) 353 lb 11.2 oz (160.4 kg)   Constitutional: overweight, in NAD Eyes:  EOMI, no exophthalmos ENT: no neck masses, no cervical lymphadenopathy Cardiovascular: RRR, No MRG Respiratory: CTA B Musculoskeletal: no deformities Skin:no rashes Neurological: no tremor with outstretched hands  ASSESSMENT: 1.  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  Hypogonadotropic hypogonadism  3. DM2 - per PCP  4. Obesity - per PCP  PLAN:  1.  History of pituitary adenoma -This measured 1 cm -s/p TSR  -Final pathology showed prolactin secreting cells, with less than 5% cells staining for ACTH -Reviewing his pituitary labs from 2017-2021, they were normal with the exception of the gonadotropin axis: Low testosterone, FSH, LH, but at the last check, IGF-I was also low.  At that time, we discussed the pluses and minuses of growth hormone therapy.  He was under investigation for enlarged lymph nodes with the?  Lymphoma diagnosis.  We discussed that growth hormone treatment would not be a good idea. -Latest pituitary MRI was from 09/2017-followed by Julian. Salomon Fick with neurosurgery -and this showed no recurrence -We will repeat his hormonal labs now -Also, we discussed about repeating his pituitary MRI now 4 years from the previous -he has an appointment already -At today's visit, we will recheck his pituitary tests (1:50 pm)  -will see the patient back in a year  2.  Hypogonadotropic  hypogonadism -She has a long history of hypogonadotropic hypogonadism -We do not have him on testosterone as he and his wife were preparing for pregnancy and possible IVF. -He returns after long absence, of more than 3 years.  He will start hCG injections in preparation for a pregnancy.  He tried Clomid in the past but did not help.  If this is not successful, they may try IVF, however, his wife has very uncontrolled diabetes so this may not be possible. -he recently had LH, FSH, and testosterone levels obtained by the fertility specialist.  Will need to get the results. -Will evaluate this after the pregnancy.  3.  Type 2 diabetes -Previously on metformin and Victoza/Saxenda -Currently on Ozempic 1 mg weekly, tolerating it well -Latest HbA1c was reviewed and this was lower, at 6.2% -Very well-controlled, managed by PCP -Will check his HbA1c today.  Added to the labs.  4.  Obesity class 3 -He was seen Julian. Leafy Ro in the weight loss but he stopped -Loss 33 pounds since our last in person visit in 04/2018 -He continues on Ozempic per PCP  Component     Latest Ref Rng 01/24/2022  Triiodothyronine,Free,Serum     2.3 -  4.2 pg/mL 3.5   T4,Free(Direct)     0.60 - 1.60 ng/dL 0.73   TSH     0.35 - 5.50 uIU/mL 2.36   Cortisol, Plasma     ug/dL 8.1   IGF-I, LC/MS     52 - 328 ng/mL 31 (L)   Z-Score (Male)     -2.0 - 2.0 SD -2.8 (L)   Prolactin     2.0 - 18.0 ng/mL 2.1   C206 ACTH     6 - 50 pg/mL 29   Hemoglobin A1C     4.6 - 6.5 % 5.9   Growth Hormone     < OR = 7.1 ng/mL <0.1   Labs are normal with the exception of low growth hormone.  Philemon Kingdom, MD PhD [facility]

## 2022-01-24 NOTE — Progress Notes (Signed)
Cardiology Office Note Date:  01/24/2022  Patient ID:  Julian Washington, Julian Washington January 15, 1981, MRN [id number] PCP:  Colon Branch, MD  Cardiologist:  Dr. Burt Knack Electrophysiologist: Dr. Quentin Ore    Chief Complaint:  f/u med change  History of Present Illness: Julian Washington is a 41 y.o. male with history of OSA on CPAP, hypogonadotropic hypogonadism, pituitary mass Julian/p adenoma removal 2014, DM, HTN, morbid obesity, AFib  Initially saw cardiology back I 2018 with palpitations, discontinuation of weight loss meds did not help, started on BB, monitoring noted PACs, rare PVC  Had  hiatus from the office until a hospitalization Oct 2022 for a syncopal event that occurred while walking to the bathroom, EMS found HR 200 found in Afib w/RVR. Saw Dr. Quentin Ore, planned for a/c, TEE/DCCV, coronary CT and AAD pending this. Not an ablation candidate given morbid obesity Had spontaneous conversion CT noted no CAD, no calcium  Nov 2022 when he saw T. Harriet Pho, PA-C for a post hospital visit, doing well  He saw Dr. Quentin Ore Jan 2023, also doing well, maintaining SR, no changes made, planned for 6 mo APP visit.  I saw him 09/07/21 He does not think he has had Afib again No CP, palpitations or cardiac awareness No SOB No recurrent syncope No dizzy spells, near syncope No bleeding or signs of bleeding  He has ADHD and while he manages, some days can be a real struggle, his attention specialist would like to restart therapy, but asked that we weigh in given stimulant component  BP was uncontrolled, losartan added.  He called late October with palpitations after the flu vaccine, this happened the year before, perhaps prompting RVR, he questioned perhaps not pursuing vaccine going forward/requesting an exemption for work, though Dr. Quentin Ore recommended that he continued to get vaccinated annually. Following with allergy team for this >> skin testing + Atypical presentation, but given testing recommend avoidance of  afluria influenza vaccines.  TODAY He is under an unusual amount of stress these days, as well as an up-tick in stress, perhaps PTSD going back to an old surgical procedure that he woke during intubation.  He and his wife are starting fertility measures this will likely be testosterone or some form of hormonal therapy, as well as cialis for ED.  He continues weight loss efforts with weight watchers and Ozempic So far a total of about 30lbs  He asks a couple questions Now that he was found to have a true allergic reaction to the flu vaccine, do we think his Afib was secondary.  2. Is it ok for him to take hormones/fertility treatment  3. His BPs have been high 140'Julian-150'Julian/80'Julian-90'Julian with headaches  He denies CP, palpitations (outside of the flue vaccine) No near syncope or syncope. No bleeding or signs of bleeding  Afib/AAD hx Diagnosed Oct 2022 Flecainide started Oct 2022   Past Medical History:  Diagnosis Date   ADHD    Anemia    Anxiety and depression    h/o suicidality w/ citalopram   Atrial fibrillation (HCC)    Diabetes mellitus without complication ([place])    Difficult intubation    ETD (eustachian tube dysfunction)    Julian/p ENT, declined ear tuves before    GERD (gastroesophageal reflux disease)    History of chicken pox    Titered on 08/16/2005   Hypogonadism male    Dr Cruzita Lederer, used to see urology   Infertility male    Internal hemorrhoids    Julian/p banding  Low testosterone    Migraine    on topamax   OSA on CPAP    Palpitations    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HCC)    h/o increased prolactin, Dr Cruzita Lederer (previously @ [facility])   Prediabetes    A1C 6.2 years ago   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   Vitamin B 12 deficiency    h/o   Vitamin D deficiency     Past Surgical History:  Procedure Laterality Date   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-04-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, ACTH   TONSILLECTOMY AND ADENOIDECTOMY      Current Outpatient Medications  Medication Sig  Dispense Refill   ACCU-CHEK FASTCLIX LANCETS MISC 1 Package by Does not apply route 2 (two) times daily. 100 each 0   acetaminophen (TYLENOL) 500 MG tablet Take 500-1,000 mg by mouth every 6 (six) hours as needed for moderate pain or headache.     atomoxetine (STRATTERA) 100 MG capsule Take 100 mg by mouth daily.     augmented betamethasone dipropionate (DIPROLENE-AF) 0.05 % cream SMARTSIG:1 sparingly Topical Twice Daily     cetirizine (ZYRTEC) 10 MG tablet Take 1 tablet (10 mg total) by mouth daily. 30 tablet 0   ELIQUIS 5 MG TABS tablet TAKE 1 TABLET TWICE A DAY 180 tablet 3   EPINEPHrine (EPIPEN 2-PAK) 0.3 mg/0.3 mL IJ SOAJ injection Inject 0.3 mg into the muscle as needed. 1 each 1   famotidine (PEPCID) 20 MG tablet Take 1 tablet (20 mg total) by mouth 2 (two) times daily. 60 tablet 0   flecainide (TAMBOCOR) 100 MG tablet TAKE 1 TABLET EVERY 12 HOURS 180 tablet 3   fluticasone (FLONASE) 50 MCG/ACT nasal spray Place 1 spray into both nostrils at bedtime.     glucose blood (ACCU-CHEK GUIDE) test strip 1 each by Other route 2 (two) times daily. Use as instructed 100 each 0   losartan (COZAAR) 25 MG tablet Take 1 tablet (25 mg total) by mouth daily. 90 tablet 3   Magnesium Oxide (MAG-OXIDE PO) Take 400 mg by mouth at bedtime.     metformin (FORTAMET) 1000 MG (OSM) 24 hr tablet Take 1 tablet (1,000 mg total) by mouth daily with breakfast. 180 tablet 1   methylphenidate (RITALIN LA) 10 MG 24 hr capsule Take 10 mg by mouth daily.     metoprolol succinate (TOPROL-XL) 50 MG 24 hr tablet TAKE 1 TABLET TWICE A DAY WITH OR IMMEDIATELY FOLLOWING A MEAL 180 tablet 3   Multiple Vitamin (MULTIVITAMIN WITH MINERALS) TABS tablet Take 1 tablet by mouth daily.     omeprazole (PRILOSEC) 40 MG capsule Take 1 capsule (40 mg total) by mouth daily before breakfast. 90 capsule 3   PREGNYL 34287 units injection Inject into the muscle.     rosuvastatin (CRESTOR) 10 MG tablet Take 1 tablet (10 mg total) by mouth at  bedtime. 90 tablet 1   Semaglutide, 1 MG/DOSE, (OZEMPIC, 1 MG/DOSE,) 4 MG/3ML SOPN Inject 1 mg into the skin once a week. 9 mL 1   SKYRIZI, 150 MG DOSE, 75 MG/0.83ML PSKT Inject 150 mg into the skin See admin instructions. Every 3 months     sodium bicarbonate 650 MG tablet Take 650 mg by mouth every 6 (six) hours.     tadalafil (CIALIS) 5 MG tablet Take 5 mg by mouth daily.     tiZANidine (ZANAFLEX) 4 MG capsule Take 1 capsule (4 mg total) by mouth 3 (three) times daily. 21 capsule 0  Vitamin D, Ergocalciferol, (DRISDOL) 1.25 MG (50000 UNIT) CAPS capsule Take 1 capsule (50,000 Units total) by mouth every 7 (seven) days. 12 capsule 0   No current facility-administered medications for this visit.    Allergies:   Levaquin [levofloxacin in d5w], Celexa [citalopram], Dextrans, Hydrocodone-acetaminophen, Latex, Oxycodone, and Shiitake mushroom   Social History:  The patient  reports that he has never smoked. He has never used smokeless tobacco. He reports that he does not currently use alcohol. He reports that he does not use drugs.   Family History:  The patient'Julian family history includes Anxiety disorder in his father; Bipolar disorder in his father; CAD in an other family member; Cancer in his maternal grandfather and paternal grandfather; Depression in his father; Diabetes in his paternal grandfather and paternal grandmother; Drug abuse in his father; Hypertension in his paternal grandmother; Obesity in his mother; Prostate cancer in an other family member.  ROS:  Please see the history of present illness.    All other systems are reviewed and otherwise negative.   PHYSICAL EXAM:  VS:  There were no vitals taken for this visit. BMI: There is no height or weight on file to calculate BMI. Well nourished, well developed, in no acute distress HEENT: normocephalic, atraumatic Neck: no JVD, carotid bruits or masses Cardiac:  RRR; no significant murmurs, no rubs, or gallops Lungs:   CTA b/l, no  wheezing, rhonchi or rales Abd: soft, nontender MS: no deformity or atrophy Ext: chronic skin changes and significant brawny-type edema he reports is better of late particularly with his weight loss) Skin: warm and dry, no rash Neuro:  No gross deficits appreciated Psych: euthymic mood, full affect   EKG:  Done today and reviewed by myself shows  SR 81bpm, stable intervals/EKG  12/04/20: Coronary CTa IMPRESSION: 1. No evidence of CAD, CADRADS = 0. 2. Coronary calcium score of 0. 3. Normal coronary origins with co-dominance.   12/03/20: TTE  1. Left ventricular ejection fraction, by estimation, is 60 to 65%. The  left ventricle has normal function. The left ventricle has no regional  wall motion abnormalities. There is mild concentric left ventricular  hypertrophy. Left ventricular diastolic  parameters were normal.   2. Right ventricular systolic function is normal. The right ventricular  size is normal.   3. The mitral valve is grossly normal. No evidence of mitral valve  regurgitation. No evidence of mitral stenosis.   4. The aortic valve was not well visualized. Aortic valve regurgitation  is not visualized. No aortic stenosis is present.   Comparison(Julian): No significant change from prior study.   Conclusion(Julian)/Recommendation(Julian): Normal biventricular function without  evidence of hemodynamically significant valvular heart disease.  Technically challenging study, but no significant abnormalities seen.   Recent Labs: 05/21/2021: Hemoglobin 13.1; Platelets 164.0; TSH 1.92 10/02/2021: ALT 15; BUN 13; Creatinine, Ser 0.79; Potassium 4.3; Sodium 141  10/02/2021: Cholesterol 87; HDL 40.00; LDL Cholesterol 33; Total CHOL/HDL Ratio 2; Triglycerides 72.0; VLDL 14.4   CrCl cannot be calculated (Patient'Julian most recent lab result is older than the maximum 21 days allowed.).   Wt Readings from Last 3 Encounters:  01/21/22 (!) 353 lb 14.4 oz (160.5 kg)  12/31/21 (!) 353 lb 11.2 oz  (160.4 kg)  12/25/21 (!) 354 lb (160.6 kg)     Other studies reviewed: Additional studies/records reviewed today include: summarized above  ASSESSMENT AND PLAN:  Paroxysmal AFib CHA2DS2Vasc is 2, on Eliquis, appropriately dosed 2.   Secondary Hypercoagulable state Flecainide and metoprolol w/stable  intervals As discussed above  ?secondary to the flu shot He is not inclined to want to come off a/c though with any possibility of true AFib/stroke  No changes right now with an acute increase in personal stressors  He will keep [place] updated on any meds planned/recommended for fertility  Discussed metoprolol and ED, he prefers to stay on the metoprolol then try CCB (made his edema much worse) and work with cialis    3.  HTN Repeat by myself was 148/92 Increase his losartan to '50mg'$  daily He will let us know his his numbers look in the next couple weeks  4. Edema Chronic for years, improved if anything Better with weight loss Has seen [facility] in the past    Disposition: back in 16mo sooner if needed    Current medicines are reviewed at length with the patient today.  The patient did not have any concerns regarding medicines.  SVenetia Night PA-C 01/24/2022 7:39 AM     [place] [phone](office)  [phone](fax)

## 2022-01-24 NOTE — Patient Instructions (Addendum)
Please get a new pituitary MRI.  Please stop at the lab.  Please come back for a follow-up appointment in 1 year.

## 2022-01-25 ENCOUNTER — Ambulatory Visit: Payer: 59 | Attending: Physician Assistant | Admitting: Physician Assistant

## 2022-01-25 ENCOUNTER — Encounter: Payer: Self-pay | Admitting: Physician Assistant

## 2022-01-25 VITALS — BP 130/72 | HR 81 | Ht 71.0 in | Wt 352.2 lb

## 2022-01-25 DIAGNOSIS — I480 Paroxysmal atrial fibrillation: Secondary | ICD-10-CM

## 2022-01-25 DIAGNOSIS — I4891 Unspecified atrial fibrillation: Secondary | ICD-10-CM | POA: Diagnosis not present

## 2022-01-25 DIAGNOSIS — Z79899 Other long term (current) drug therapy: Secondary | ICD-10-CM

## 2022-01-25 DIAGNOSIS — I10 Essential (primary) hypertension: Secondary | ICD-10-CM | POA: Diagnosis not present

## 2022-01-25 DIAGNOSIS — Z5181 Encounter for therapeutic drug level monitoring: Secondary | ICD-10-CM

## 2022-01-25 DIAGNOSIS — D6869 Other thrombophilia: Secondary | ICD-10-CM

## 2022-01-25 MED ORDER — LOSARTAN POTASSIUM 50 MG PO TABS: 50.0000 mg | ORAL_TABLET | Freq: Every day | ORAL | 1 refills | Status: DC

## 2022-01-25 NOTE — Patient Instructions (Signed)
Medication Instructions:   START TAKING : LOSARTAN 50 MG ONCE A DAY   *If you need a refill on your cardiac medications before your next appointment, please call your pharmacy*   Lab Work:  NONE ORDERED  TODAY   If you have labs (blood work) drawn today and your tests are completely normal, you will receive your results only by: [place] (if you have MyChart) OR A paper copy in the mail If you have any lab test that is abnormal or we need to change your treatment, we will call you to review the results.   Testing/Procedures: NONE ORDERED  TODAY     Follow-Up: At [facility], you and your health needs are our priority.  As part of our continuing mission to provide you with exceptional heart care, we have created designated Provider Care Teams.  These Care Teams include your primary Cardiologist (physician) and Advanced Practice Providers (APPs -  Physician Assistants and Nurse Practitioners) who all work together to provide you with the care you need, when you need it.  We recommend signing up for the patient portal called "MyChart".  Sign up information is provided on this After Visit Summary.  MyChart is used to connect with patients for Virtual Visits (Telemedicine).  Patients are able to view lab/test results, encounter notes, upcoming appointments, etc.  Non-urgent messages can be sent to your provider as well.   To learn more about what you can do with MyChart, go to [email or web address].    Your next appointment:   6 month(s)  The format for your next appointment:   In Person  Provider:   You may see Dr. Quentin Ore  or one of the following Advanced Practice Providers on your designated Care Team:   Tommye Standard, [place] Legrand Como "Jonni Sanger" Chalmers Cater, [place]   Other Instructions   Important Information About Sugar

## 2022-01-29 LAB — INSULIN-LIKE GROWTH FACTOR
IGF-I, LC/MS: 31 ng/mL — ABNORMAL LOW (ref 52–328)
Z-Score (Male): -2.8 SD — ABNORMAL LOW (ref ?–2.0)

## 2022-01-29 LAB — PROLACTIN: Prolactin: 2.1 ng/mL (ref 2.0–18.0)

## 2022-01-29 LAB — ACTH: C206 ACTH: 29 pg/mL (ref 6–50)

## 2022-01-29 LAB — GROWTH HORMONE: Growth Hormone: <0.1 ng/mL (ref <=7.1)

## 2022-02-07 ENCOUNTER — Encounter: Payer: Self-pay | Admitting: Internal Medicine

## 2022-02-07 NOTE — Progress Notes (Signed)
Follow Up Note  RE: Julian Washington MRN: [id number] DOB: 1980-11-15 Date of Office Visit: 02/08/2022  Referring provider: Colon Branch, MD Primary care provider: Colon Branch, MD  Chief Complaint: Food/Drug Challenge (Covid 19/)  Assessment and Plan: Julian Washington is a 41 y.o. 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 If you see these symptoms, use Benadryl for mild symptoms and epinephrine for more severe symptoms and call 911.   History of Present Illness: I had the pleasure of seeing Julian Washington for a follow up visit at the [facility] on 02/08/2022. He is a 41 y.o. male, who is being followed for anaphylaxis. His previous allergy office visit was on 01/21/22 with Dr. Simona Huh. Today he is here for Covid-19 vaccine drug testing and challenge.   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  He had his flu vaccine this year and developed palpitations , positive ID testing to afluria at most recent visit. No prior reactions to covid-19 vaccine.  Interval History: Patient has not been ill, he has not had any accidental exposures to the culprit medication.   Recent/Current History: Pulmonary disease: no Cardiac disease: no Respiratory infection: no Rash: no Itch:  yes-eczema flare on arms Swelling: no Cough: no Shortness of breath: no Runny/stuffy nose: no Itchy eyes: no  Patient/guardian was informed of the test procedure with verbalized understanding of the risk of anaphylaxis. Consent was signed.   Patient took all premedications in preparation for today's graded IM vaccine challenge.   Medication List:  Current Outpatient Medications  Medication Sig  Dispense Refill   ACCU-CHEK FASTCLIX LANCETS MISC 1 Package by Does not apply route 2 (two) times daily. 100 each 0   acetaminophen (TYLENOL) 500 MG tablet Take 500-1,000 mg by mouth every 6 (six) hours as needed for moderate pain or headache.     atomoxetine (STRATTERA) 100 MG capsule Take 100 mg by mouth daily.     augmented betamethasone dipropionate (DIPROLENE-AF) 0.05 % cream SMARTSIG:1 sparingly Topical Twice Daily     cetirizine (ZYRTEC) 10 MG tablet Take 1 tablet (10 mg total) by mouth daily. 30 tablet 0   ELIQUIS 5 MG TABS tablet TAKE 1 TABLET TWICE A DAY 180 tablet 3   EPINEPHrine (EPIPEN 2-PAK) 0.3 mg/0.3 mL IJ SOAJ injection Inject 0.3 mg into the muscle as needed. 1 each 1   famotidine (PEPCID) 20 MG tablet Take 1 tablet (20 mg total) by mouth 2 (two) times daily. 60 tablet 0   flecainide (TAMBOCOR) 100 MG tablet TAKE 1 TABLET EVERY 12 HOURS 180 tablet 3   glucose blood (ACCU-CHEK GUIDE) test strip 1 each by Other route 2 (two) times daily. Use as instructed 100 each 0   human chorionic gonadotropin (PREGNYL) 94174 units injection Inject 2,500 Units into the muscle 2 (two) times a week.     losartan (COZAAR) 50 MG tablet Take 1 tablet (50 mg total) by mouth daily. 90 tablet 1   Magnesium Oxide (MAG-OXIDE PO) Take 400 mg by mouth at bedtime.     metformin (FORTAMET) 1000 MG (OSM) 24 hr tablet Take 1 tablet (1,000 mg total) by mouth daily with breakfast. 180 tablet 1  metoprolol succinate (TOPROL-XL) 50 MG 24 hr tablet TAKE 1 TABLET TWICE A DAY WITH OR IMMEDIATELY FOLLOWING A MEAL 180 tablet 3   Multiple Vitamin (MULTIVITAMIN WITH MINERALS) TABS tablet Take 1 tablet by mouth daily.     omeprazole (PRILOSEC) 40 MG capsule Take 1 capsule (40 mg total) by mouth daily before breakfast. 90 capsule 3   rosuvastatin (CRESTOR) 10 MG tablet Take 1 tablet (10 mg total) by mouth at bedtime. 90 tablet 1   Semaglutide, 1 MG/DOSE, (OZEMPIC, 1 MG/DOSE,) 4 MG/3ML SOPN Inject 1 mg into the skin once a  week. 9 mL 1   SKYRIZI, 150 MG DOSE, 75 MG/0.83ML PSKT Inject 150 mg into the skin See admin instructions. Every 3 months     tadalafil (CIALIS) 5 MG tablet Take 2 tablets by mouth daily. (10 mg Total)     tiZANidine (ZANAFLEX) 4 MG capsule Take 1 capsule (4 mg total) by mouth 3 (three) times daily. 21 capsule 0   No current facility-administered medications for this visit.   Allergies: Allergies  Allergen Reactions   Levaquin [Levofloxacin In D5w] Swelling   Celexa [Citalopram] Other (See Comments)    Mental status changes    Dextrans Other (See Comments)    Makes the pt. Drowsy.    Hydrocodone-Acetaminophen     REACTION: itching   Latex    Oxycodone Itching   Shiitake Mushroom Rash   I reviewed his past medical history, social history, family history, and environmental history and no significant changes have been reported from his previous visit.  Review of Systems-negative except as per HPI Objective: BP 122/78 (BP Location: Left Arm, Patient Position: Sitting, Cuff Size: Large)   Pulse 76   Temp 98.1 F (36.7 C) (Temporal)   Resp 20   Wt (!) 363 lb 11.2 oz (165 kg)   SpO2 97%   BMI 50.73 kg/m  Body mass index is 50.73 kg/m.   General Appearance:  Alert, cooperative, no distress, appears stated age  Head:  Normocephalic, without obvious abnormality, atraumatic  Eyes:  Conjunctiva clear, EOM's intact  Nose: Nares normal  Throat: Lips, tongue normal; teeth and gums normal  Neck: Supple, symmetrical  Lungs:   Respirations unlabored, no coughing  Heart:  Appears well perfused  Extremities: No edema  Skin: Skin color, texture, turgor normal, erythematous dry patches on left upper extremity, bilateral erythema and dry skin patches on upper extremities, neck and scalp.  Neurologic: No gross deficits     Diagnostics:     Oral Challenge - 02/08/22 0900     Challenge Food/Drug covid 19    Food/Drug provided by office    BP 126/78    Pulse 87    Respirations 20     Lungs clear    Skin clear    Mouth clear    Time 0851    Dose 0.046m    Time 0914    Dose 2.9714m   Dose vitals only            Previous notes and tests were reviewed. The plan was reviewed with the patient/family, and all questions/concerned were addressed.  It was my pleasure to see RoStaceoday and participate in his care. Please feel free to contact me with any questions or concerns.  ErSigurd SosMD [facility]

## 2022-02-08 ENCOUNTER — Other Ambulatory Visit (HOSPITAL_COMMUNITY): Payer: Self-pay

## 2022-02-08 ENCOUNTER — Encounter: Payer: Self-pay | Admitting: Internal Medicine

## 2022-02-08 ENCOUNTER — Ambulatory Visit: Payer: 59 | Admitting: Internal Medicine

## 2022-02-08 ENCOUNTER — Other Ambulatory Visit: Payer: Self-pay

## 2022-02-08 VITALS — BP 122/78 | HR 76 | Temp 98.1°F | Resp 20 | Wt 363.7 lb

## 2022-02-08 DIAGNOSIS — Z888 Allergy status to other drugs, medicaments and biological substances status: Secondary | ICD-10-CM | POA: Diagnosis not present

## 2022-02-08 MED ORDER — COVID-19 MRNA 2023-2024 VACCINE (COMIRNATY) 0.3 ML INJECTION: 0.3000 mL | Freq: Once | INTRAMUSCULAR | 0 refills | Status: DC

## 2022-02-08 NOTE — Addendum Note (Signed)
Addended by: Brandt Loosen, Lyriq Finerty E on: 02/08/2022 11:24 AM   Modules accepted: Orders

## 2022-02-08 NOTE — Addendum Note (Signed)
Addended by: Brandt Loosen, Janean Eischen E on: 02/08/2022 10:50 AM   Modules accepted: Orders

## 2022-02-11 ENCOUNTER — Encounter: Payer: Self-pay | Admitting: Internal Medicine

## 2022-02-12 ENCOUNTER — Other Ambulatory Visit (HOSPITAL_COMMUNITY): Payer: Self-pay

## 2022-02-12 ENCOUNTER — Ambulatory Visit: Payer: 59 | Attending: Internal Medicine | Admitting: Pharmacist

## 2022-02-12 DIAGNOSIS — Z79899 Other long term (current) drug therapy: Secondary | ICD-10-CM

## 2022-02-12 MED ORDER — LOSARTAN POTASSIUM 50 MG PO TABS
50.0000 mg | ORAL_TABLET | Freq: Every day | ORAL | 0 refills | Status: DC
  Filled 2022-06-04: qty 90, 90d supply, fill #0

## 2022-02-12 MED ORDER — OMEPRAZOLE 40 MG PO CPDR
40.0000 mg | DELAYED_RELEASE_CAPSULE | Freq: Every day | ORAL | 2 refills | Status: DC
  Filled 2022-05-21: qty 90, 90d supply, fill #0
  Filled 2022-08-19: qty 90, 90d supply, fill #1

## 2022-02-12 MED ORDER — ATOMOXETINE HCL 100 MG PO CAPS
100.0000 mg | ORAL_CAPSULE | Freq: Every day | ORAL | 0 refills | Status: DC
  Filled 2022-02-12: qty 90, 90d supply, fill #0

## 2022-02-12 MED ORDER — METFORMIN HCL ER (OSM) 1000 MG PO TB24
1000.0000 mg | ORAL_TABLET | Freq: Every day | ORAL | 2 refills | Status: DC
  Filled 2022-03-13: qty 90, 90d supply, fill #0

## 2022-02-12 MED ORDER — METOPROLOL SUCCINATE ER 50 MG PO TB24
50.0000 mg | ORAL_TABLET | Freq: Two times a day (BID) | ORAL | 1 refills | Status: DC
  Filled 2022-02-12 – 2022-04-04 (×2): qty 180, 90d supply, fill #0
  Filled 2022-07-05: qty 180, 90d supply, fill #1

## 2022-02-12 MED ORDER — APIXABAN 5 MG PO TABS
5.0000 mg | ORAL_TABLET | Freq: Two times a day (BID) | ORAL | 2 refills | Status: DC
  Filled 2022-04-04: qty 180, 90d supply, fill #0
  Filled 2022-07-05: qty 180, 90d supply, fill #1
  Filled 2022-10-15: qty 180, 90d supply, fill #2

## 2022-02-12 MED ORDER — FLECAINIDE ACETATE 100 MG PO TABS
100.0000 mg | ORAL_TABLET | Freq: Two times a day (BID) | ORAL | 1 refills | Status: DC
  Filled 2022-04-04: qty 180, 90d supply, fill #0
  Filled 2022-07-05: qty 180, 90d supply, fill #1

## 2022-02-12 MED ORDER — EPINEPHRINE 0.3 MG/0.3ML IJ SOAJ: INTRAMUSCULAR | 0 refills | Status: DC

## 2022-02-12 MED ORDER — OZEMPIC (1 MG/DOSE) 4 MG/3ML ~~LOC~~ SOPN: 1.0000 mg | PEN_INJECTOR | SUBCUTANEOUS | 0 refills | Status: DC

## 2022-02-12 MED ORDER — SKYRIZI PEN 150 MG/ML ~~LOC~~ SOAJ
SUBCUTANEOUS | 3 refills | Status: DC
  Filled 2022-02-12: qty 1, fill #0
  Filled 2022-03-01 – 2022-03-11 (×2): qty 1, 84d supply, fill #0
  Filled 2022-05-28: qty 1, 84d supply, fill #1
  Filled 2022-08-22: qty 1, 84d supply, fill #2
  Filled 2022-11-27: qty 1, 84d supply, fill #3

## 2022-02-12 MED ORDER — SKYRIZI PEN 150 MG/ML ~~LOC~~ SOAJ: SUBCUTANEOUS | 3 refills | Status: DC

## 2022-02-12 NOTE — Progress Notes (Signed)
   S: Patient presents for review of their specialty medication therapy.  Patient is currently taking Skyrizi for psoriasis. Patient is managed by Dr. Fontaine No for this.   Adherence: confirms  Efficacy: reports that this works well for him. Has been on for ~2 years.  Dosing: Plaque psoriasis, moderate to severe: SubQ: Two consecutive injections (75 mg each) for a total dose of 150 mg at weeks 0, 4, and then every 12 weeks thereafter.  Screening: TB test: completed  Monitoring: S/sx of infection: none  S/sx of hypersensitivity/injection site reaction: none   O:     Lab Results  Component Value Date   WBC 7.3 05/21/2021   HGB 13.1 05/21/2021   HCT 39.1 05/21/2021   MCV 80.8 05/21/2021   PLT 164.0 05/21/2021      Chemistry      Component Value Date/Time   NA 141 10/02/2021 0744   NA 141 11/20/2016 0811   K 4.3 10/02/2021 0744   CL 105 10/02/2021 0744   CO2 25 10/02/2021 0744   BUN 13 10/02/2021 0744   BUN 18 11/20/2016 0811   CREATININE 0.79 10/02/2021 0744   CREATININE 0.75 07/09/2019 0942   CREATININE 0.73 01/19/2016 1624      Component Value Date/Time   CALCIUM 9.0 10/02/2021 0744   ALKPHOS 55 05/21/2021 1544   AST 13 10/02/2021 0735   AST 16 07/09/2019 0942   ALT 15 10/02/2021 0735   ALT 25 07/09/2019 0942   BILITOT 0.4 05/21/2021 1544   BILITOT 0.5 07/09/2019 0942       A/P: 1. Medication review: Patient currently on Kincaid for psoriasis. Reviewed the medication with the patient, including the following: Orson Ape is a monoclonal antibody used in the treatment of psoriasis. Patient educated on purpose, proper use and potential adverse effects of Skyrizi. Possible adverse effects are infections, headache, and injection site reactions. Live vaccinations should be avoided while on therapy. SubQ: Administer the two consecutive injections subcutaneously at different anatomic locations, such as thighs, abdomen, or back of upper arms. Do not inject into areas  where the skin is tender, bruised, red, hard, or affected by psoriasis. Intended for use under supervision of a health care professional; self-injection may occur after proper training (except back of upper arms). No recommendations for any changes at this time.  Benard Halsted, PharmD, [place], [place] [phone]

## 2022-02-13 ENCOUNTER — Other Ambulatory Visit (HOSPITAL_COMMUNITY): Payer: Self-pay

## 2022-02-13 NOTE — Telephone Encounter (Signed)
Hello, looks like a local reaction based on the pictures.  This can happen to anyone especially with boosters and does not mean there is an increased risk of more serious reactions.  I recommend continuing antihistamines, ice and ibuprofen might help.  For the next years booster, recommend getting it in the opposite arm.  Thanks!

## 2022-02-14 ENCOUNTER — Other Ambulatory Visit (HOSPITAL_COMMUNITY): Payer: Self-pay

## 2022-02-15 ENCOUNTER — Other Ambulatory Visit (HOSPITAL_COMMUNITY): Payer: Self-pay

## 2022-02-15 ENCOUNTER — Ambulatory Visit
Admission: EM | Admit: 2022-02-15 | Discharge: 2022-02-15 | Disposition: A | Discharge status: Home / Self Care | Payer: Commercial Managed Care - PPO | Attending: Physician Assistant | Admitting: Physician Assistant

## 2022-02-15 ENCOUNTER — Other Ambulatory Visit: Payer: Self-pay

## 2022-02-15 DIAGNOSIS — H6504 Acute serous otitis media, recurrent, right ear: Secondary | ICD-10-CM | POA: Diagnosis not present

## 2022-02-15 DIAGNOSIS — J069 Acute upper respiratory infection, unspecified: Secondary | ICD-10-CM | POA: Diagnosis not present

## 2022-02-15 MED ORDER — BENZONATATE 100 MG PO CAPS: 100.0000 mg | ORAL_CAPSULE | Freq: Three times a day (TID) | ORAL | 0 refills | Status: DC

## 2022-02-15 MED ORDER — AMOXICILLIN-POT CLAVULANATE 875-125 MG PO TABS: 1.0000 | ORAL_TABLET | Freq: Two times a day (BID) | ORAL | 0 refills | Status: DC

## 2022-02-15 NOTE — ED Triage Notes (Addendum)
[facility] with [facility] of sore throat for 2 days and new onset of otalgia and cough today. Pt has pmh of ear infections and is concerned for another one now. Pt has used otc tylenol  Pt reports at home covid neg

## 2022-02-15 NOTE — Discharge Instructions (Signed)
Advised take ibuprofen or Tylenol for pain or discomfort. Advised to use Tessalon Perles every 8 hours as needed to help control cough. Advised take the Augmentin 875 mg every 12 hours with food to treat ear infection. Advised follow-up PCP return to urgent care if symptoms fail to improve

## 2022-02-15 NOTE — ED Provider Notes (Signed)
[facility]    CSN: [id number] Arrival date & time: 02/15/22  1418      History   Chief Complaint Chief Complaint  Patient presents with   Ear Fullness    Ear ache with scratchy throat, cough, and upper chest pain with cough. Typical presentation for ear infection. - Entered by patient   Sore Throat    HPI Julian Washington is a 42 y.o. male.   42 year old male presents with cough, congestion, right ear pain.  Patient indicates that he has a history of having right ear infections.  For the past 2 days he has been having upper respiratory symptoms of sinus congestion, postnasal drip, rhinitis which has been yellow.  He also indicates having bilateral ear pressure with the right being the worst and pain associated.  He also indicates that he has been having chest congestion and cough which is intermittent production has been mainly yellow and thick.  Patient indicates he has had some mild fever at home, no body aches or pain, denies wheezing, shortness of breath, nausea or vomiting.  He he is taking some OTC cold medication which is helping to control symptoms.  He is mainly worried about having an ear infection right side due to the pain.   Ear Fullness  Sore Throat    Past Medical History:  Diagnosis Date   ADHD    Anemia    Anxiety and depression    h/o suicidality w/ citalopram   Atrial fibrillation (HCC)    Diabetes mellitus without complication ([place])    Difficult intubation    ETD (eustachian tube dysfunction)    s/p ENT, declined ear tuves before    GERD (gastroesophageal reflux disease)    History of chicken pox    Titered on 08/16/2005   Hypogonadism male    Dr Cruzita Lederer, used to see urology   Infertility male    Internal hemorrhoids    s/p banding   Low testosterone    Migraine    on topamax   OSA on CPAP    Palpitations    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[place])    h/o increased prolactin, Dr Cruzita Lederer (previously @ [facility])   Prediabetes    A1C 6.2 years ago    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   Vitamin B 12 deficiency    h/o   Vitamin D deficiency     Patient Active Problem List   Diagnosis Date Noted   Dyslipidemia 08/21/2021   Elevated troponin I level    Paroxysmal A-fib ([place]) 12/02/2020   Abnormal finding on lung imaging 06/26/2018   ADHD 02/28/2018   Chronic venous insufficiency of lower extremity 02/28/2018   Psoriasis 02/24/2018   Splenomegaly 09/24/2017   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   Absolute anemia 12/04/2016   HTN (hypertension) 12/04/2016   Type 2 diabetes mellitus without complication, without long-term current use of insulin ([place]) 09/25/2016   Morbid obesity ([place]) 09/25/2016   PCP NOTES >>> 11/15/2014   H/O: pituitary tumor 01/28/2014   Annual physical exam 07/13/2013   Anxiety and depression    Vitamin B 12 deficiency    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 Authorizing Provider  amoxicillin-clavulanate (AUGMENTIN) 875-125 MG tablet Take 1 tablet by mouth every 12 (twelve) hours. 02/15/22  Yes Nyoka Lint, PA-C  benzonatate (TESSALON) 100 MG capsule Take 1 capsule (100 mg total) by mouth every 8 (eight) hours. 02/15/22  Yes Nyoka Lint, PA-C  ACCU-CHEK FASTCLIX LANCETS MISC 1 Package by Does not apply route 2 (two) times daily. 04/23/17   Waldon Merl, PA-C  acetaminophen (TYLENOL) 500 MG tablet Take 500-1,000 mg by mouth every 6 (six) hours as needed for moderate pain or headache.    [provider]  apixaban (ELIQUIS) 5 MG TABS tablet Take 1 tablet (5 mg total) by mouth 2 (two) times daily. 12/18/21     atomoxetine (STRATTERA)  100 MG capsule Take 100 mg by mouth daily.    [provider]  atomoxetine (STRATTERA) 100 MG capsule Take 1 capsule (100 mg total) by mouth daily. 04/19/21     augmented betamethasone dipropionate (DIPROLENE-AF) 0.05 % cream SMARTSIG:1 sparingly Topical Twice Daily 11/27/21   [provider]  cetirizine (ZYRTEC) 10 MG tablet Take 1 tablet (10 mg total) by mouth daily. 01/21/22 02/20/22  Roney Marion, MD  ELIQUIS 5 MG TABS tablet TAKE 1 TABLET TWICE A DAY 12/18/21   Loel Dubonnet, NP  EPINEPHrine (EPIPEN 2-PAK) 0.3 mg/0.3 mL IJ SOAJ injection Inject 0.3 mg into the muscle as needed. 01/21/22   Roney Marion, MD  EPINEPHrine 0.3 mg/0.3 mL IJ SOAJ injection Inject 0.3 mg into the muscle as needed 01/21/22     famotidine (PEPCID) 20 MG tablet Take 1 tablet (20 mg total) by mouth 2 (two) times daily. 01/21/22   Roney Marion, MD  flecainide (TAMBOCOR) 100 MG tablet TAKE 1 TABLET EVERY 12 HOURS 11/12/21   Vickie Epley, MD  flecainide (TAMBOCOR) 100 MG tablet Take 1 tablet (100 mg total) by mouth every 12 (twelve) hours. 11/12/21     glucose blood (ACCU-CHEK GUIDE) test strip 1 each by Other route 2 (two) times daily. Use as instructed 04/23/17   Waldon Merl, PA-C  human chorionic gonadotropin (PREGNYL) 10626 units injection Inject 2,500 Units into the muscle 2 (two) times a week.    [provider]  losartan (COZAAR) 50 MG tablet Take 1 tablet (50 mg total) by mouth daily. 01/25/22   Baldwin Jamaica, PA-C  losartan (COZAAR) 50 MG tablet Take 1 tablet (50 mg total) by mouth daily. 01/25/22     Magnesium Oxide (MAG-OXIDE PO) Take 400 mg by mouth at bedtime.    [provider]  metformin (FORTAMET) 1000 MG (OSM) 24 hr tablet Take 1 tablet (1,000 mg total) by mouth daily with breakfast. 11/19/21   Colon Branch, MD  metformin (FORTAMET) 1000 MG (OSM) 24 hr tablet Take 1 tablet (1,000 mg total) by mouth daily with breakfast. 11/19/21   Colon Branch, MD   metoprolol succinate (TOPROL-XL) 50 MG 24 hr tablet TAKE 1 TABLET TWICE A DAY WITH OR IMMEDIATELY FOLLOWING A MEAL 11/12/21   Vickie Epley, MD  metoprolol succinate (TOPROL-XL) 50 MG 24 hr tablet Take 1 tablet (50 mg total) by mouth 2 (two) times daily with or immediately following a meal 11/12/21     Multiple Vitamin (MULTIVITAMIN WITH MINERALS) TABS tablet Take 1 tablet by mouth daily.    [provider]  omeprazole (PRILOSEC) 40 MG capsule Take 1 capsule (40 mg  total) by mouth daily before breakfast. 10/31/21   Colon Branch, MD  omeprazole (PRILOSEC) 40 MG capsule Take 1 capsule (40 mg total) by mouth daily before breakfast. 10/31/21     Risankizumab-rzaa (SKYRIZI PEN) 150 MG/ML SOAJ Inject 1 pen injector subcutaneously every three months 02/12/22   Tresa Garter, MD  rosuvastatin (CRESTOR) 10 MG tablet Take 1 tablet (10 mg total) by mouth at bedtime. 11/28/21   Colon Branch, MD  Semaglutide, 1 MG/DOSE, (OZEMPIC, 1 MG/DOSE,) 4 MG/3ML SOPN Inject 1 mg into the skin once a week. 01/21/22   Colon Branch, MD  Semaglutide, 1 MG/DOSE, (OZEMPIC, 1 MG/DOSE,) 4 MG/3ML SOPN Inject 1 mg into the skin once a week. 01/21/22   Paz, Alda Berthold, MD  SKYRIZI, 150 MG DOSE, 75 MG/0.83ML PSKT Inject 150 mg into the skin See admin instructions. Every 3 months 04/09/19   [provider]  tadalafil (CIALIS) 5 MG tablet Take 2 tablets by mouth daily. (10 mg Total)    [provider]  tiZANidine (ZANAFLEX) 4 MG capsule Take 1 capsule (4 mg total) by mouth 3 (three) times daily. 10/29/21   Colon Branch, MD    Family History Family History  Problem Relation Age of Onset   Obesity Mother    Depression Father    Anxiety disorder Father    Bipolar disorder Father    Drug abuse Father    Cancer Maternal Grandfather        GBM    Diabetes Paternal Grandmother    Hypertension Paternal Grandmother    Diabetes Paternal Grandfather    Cancer Paternal Grandfather        prostate cancer    Prostate cancer Other        GF   CAD Other        PGF   Colon cancer Neg Hx     Social History Social History   Tobacco Use   Smoking status: Never   Smokeless tobacco: Never  Vaping Use   Vaping Use: Never used  Substance Use Topics   Alcohol use: Not Currently    Comment: none x 10 yrs   Drug use: No     Allergies   Levaquin [levofloxacin in d5w], Celexa [citalopram], Dextrans, Hydrocodone-acetaminophen, Latex, Oxycodone, and Shiitake mushroom   Review of Systems Review of Systems  HENT:  Positive for ear pain (right).   Respiratory:  Positive for cough.      Physical Exam Triage Vital Signs ED Triage Vitals  Enc Vitals Group     BP 02/15/22 1512 (!) 166/94     Pulse Rate 02/15/22 1512 78     Resp 02/15/22 1512 19     Temp 02/15/22 1512 100 F (37.8 C)     Temp src --      SpO2 02/15/22 1512 99 %     Weight --      Height --      Head Circumference --      Peak Flow --      Pain Score 02/15/22 1511 3     Pain Loc --      Pain Edu? --      Excl. in [place]? --    No data found.  Updated Vital Signs BP (!) 166/94   Pulse 78   Temp 100 F (37.8 C)   Resp 19   SpO2 99%   Visual Acuity Right Eye Distance:   Left Eye Distance:   Bilateral  Distance:    Right Eye Near:   Left Eye Near:    Bilateral Near:     Physical Exam Constitutional:      Appearance: He is well-developed.  HENT:     Right Ear: Ear canal normal. Tympanic membrane is erythematous.     Left Ear: Ear canal normal. Tympanic membrane is injected.     Mouth/Throat:     Mouth: Mucous membranes are moist.     Pharynx: Oropharynx is clear. No posterior oropharyngeal erythema.  Cardiovascular:     Rate and Rhythm: Normal rate and regular rhythm.     Heart sounds: Normal heart sounds.  Pulmonary:     Effort: Pulmonary effort is normal.     Breath sounds: Normal breath sounds and air entry. No wheezing, rhonchi or rales.  Lymphadenopathy:     Cervical: No cervical adenopathy.   Neurological:     Mental Status: He is alert.      UC Treatments / Results  Labs (all labs ordered are listed, but only abnormal results are displayed) Labs Reviewed - No data to display  EKG   Radiology No results found.  Procedures Procedures (including critical care time)  Medications Ordered in UC Medications - No data to display  Initial Impression / Assessment and Plan / UC Course  I have reviewed the triage vital signs and the nursing notes.  Pertinent labs & imaging results that were available during my care of the patient were reviewed by me and considered in my medical decision making (see chart for details).    Plan: 1.  The acute otitis media of the right ear be treated with the following: A.  Augmentin 875 mg every 12 hours to treat the infection. 2.  The acute upper respiratory tract infection be treated with the following: A.  Tessalon Perles 100 mg every 8 hours to control cough. 3.  Advised take Tylenol or ibuprofen for pain or discomfort. 3.  Patient advised follow-up PCP or return to urgent care if symptoms fail to improve. Final Clinical Impressions(s) / UC Diagnoses   Final diagnoses:  Acute upper respiratory infection  Recurrent acute serous otitis media of right ear     Discharge Instructions      Advised take ibuprofen or Tylenol for pain or discomfort. Advised to use Tessalon Perles every 8 hours as needed to help control cough. Advised take the Augmentin 875 mg every 12 hours with food to treat ear infection. Advised follow-up PCP return to urgent care if symptoms fail to improve    ED Prescriptions     Medication Sig Dispense Auth. Provider   amoxicillin-clavulanate (AUGMENTIN) 875-125 MG tablet Take 1 tablet by mouth every 12 (twelve) hours. 14 tablet Nyoka Lint, PA-C   benzonatate (TESSALON) 100 MG capsule Take 1 capsule (100 mg total) by mouth every 8 (eight) hours. 21 capsule Nyoka Lint, PA-C      PDMP not reviewed  this encounter.   Nyoka Lint, PA-C 02/15/22 1537

## 2022-02-18 ENCOUNTER — Other Ambulatory Visit (HOSPITAL_COMMUNITY): Payer: Self-pay

## 2022-02-19 ENCOUNTER — Other Ambulatory Visit (HOSPITAL_COMMUNITY): Payer: Self-pay

## 2022-02-20 ENCOUNTER — Other Ambulatory Visit (HOSPITAL_COMMUNITY): Payer: Self-pay

## 2022-02-23 ENCOUNTER — Telehealth: Payer: Commercial Managed Care - PPO | Admitting: Nurse Practitioner

## 2022-02-23 DIAGNOSIS — G4733 Obstructive sleep apnea (adult) (pediatric): Secondary | ICD-10-CM | POA: Diagnosis not present

## 2022-02-23 DIAGNOSIS — H6993 Unspecified Eustachian tube disorder, bilateral: Secondary | ICD-10-CM | POA: Diagnosis not present

## 2022-02-23 MED ORDER — FLUTICASONE PROPIONATE 50 MCG/ACT NA SUSP: 2.0000 | Freq: Every day | NASAL | 6 refills | Status: DC

## 2022-02-23 MED ORDER — PREDNISONE 10 MG (21) PO TBPK: ORAL_TABLET | ORAL | 0 refills | Status: DC

## 2022-02-23 NOTE — Progress Notes (Signed)
E-Visit for Ear Pain - Eustachian Tube Dysfunction   We are sorry that you are not feeling well. Here is how we plan to help!  Based on what you have shared with me it looks like you have Eustachian Tube Dysfunction.  Eustachian Tube Dysfunction is a condition where the tubes that connect your middle ears to your upper throat become blocked. This can lead to discomfort, hearing difficulties and a feeling of fullness in your ear. Eustachian tube dysfunction usually resolves itself in a few days. The usual symptoms include: Hearing problems Tinnitus, or ringing in your ears Clicking or popping sounds A feeling of fullness in your ears Pain that mimics an ear infection Dizziness, vertigo or balance problems A "tickling" sensation in your ears  ?Eustachian tube dysfunction symptoms may get worse in higher altitudes. This is called barotrauma, and it can happen while scuba diving, flying in an airplane or driving in the mountains.   What causes eustachian tube dysfunction? Allergies and infections (like the common cold and the flu) are the most common causes of eustachian tube dysfunction. These conditions can cause inflammation and mucus buildup, leading to blockage. GERD, or chronic acid reflux, can also cause ETD. This is because stomach acid can back up into your throat and result in inflammation. As mentioned above, altitude changes can also cause ETD.   What are some common eustachian tube dysfunction treatments? In most cases, treatment isn't necessary because ETD often resolves on its own. However, you might need treatment if your symptoms linger for more than two weeks.    Eustachian tube dysfunction treatment depends on the cause and the severity of your condition. Treatments may include home remedies, medications or, in severe cases, surgery.     HOME CARE: Sometimes simple home remedies can help with mild cases of eustachian tube dysfunction. To try and clear the blockage, you  can: Chew gum. Yawn. Swallow. Try the Valsalva maneuver (breathing out forcefully while closing your mouth and pinching your nostrils). Use a saline spray to clear out nasal passages.  MEDICATIONS: Over-the-counter medications can help if allergies are causing eustachian tube dysfunction. Try antihistamines (like cetirizine or diphenhydramine) to ease your symptoms. If you have discomfort, pain relievers -- such as acetaminophen or ibuprofen -- can help.  Sometimes intranasal glucocorticosteroids (like Flonase or Nasacort) help.  I have prescribed Fluticasone 50 mcg/spray 2 sprays in each nostril daily for 10-14 days Meds ordered this encounter  Medications   predniSONE (STERAPRED UNI-PAK 21 TAB) 10 MG (21) TBPK tablet    Sig: Take 6 tablets on day one, 5 on day two, 4 on day three, 3 on day four, 2 on day five, and 1 on day six. Take with food.    Dispense:  21 tablet    Refill:  0      GET HELP RIGHT AWAY IF: Fever is over 102.2 degrees. You develop progressive ear pain or hearing loss. Ear symptoms persist longer than 3 days after treatment.  MAKE SURE YOU: Understand these instructions. Will watch your condition. Will get help right away if you are not doing well or get worse.  Thank you for choosing an e-visit.  Your e-visit answers were reviewed by a board certified advanced clinical practitioner to complete your personal care plan. Depending upon the condition, your plan could have included both over the counter or prescription medications.  Please review your pharmacy choice. Make sure the pharmacy is open so you can pick up the prescription now. If there  is a problem, you may contact your provider through [employer] and have the prescription routed to another pharmacy.  Your safety is important to [place]. If you have drug allergies check your prescription carefully.   For the next 24 hours you can use MyChart to ask questions about today's visit, request a non-urgent  call back, or ask for a work or school excuse. You will get an email with a survey after your eVisit asking about your experience. We would appreciate your feedback. I hope that your e-visit has been valuable and will aid in your recovery.  I spent approximately 5 minutes reviewing the patient's history, current symptoms and coordinating their care today.

## 2022-02-25 ENCOUNTER — Other Ambulatory Visit (HOSPITAL_COMMUNITY): Payer: Self-pay

## 2022-02-27 ENCOUNTER — Other Ambulatory Visit (HOSPITAL_COMMUNITY): Payer: Self-pay

## 2022-03-01 ENCOUNTER — Other Ambulatory Visit (HOSPITAL_COMMUNITY): Payer: Self-pay

## 2022-03-04 ENCOUNTER — Other Ambulatory Visit (HOSPITAL_COMMUNITY): Payer: Self-pay

## 2022-03-05 ENCOUNTER — Other Ambulatory Visit (HOSPITAL_COMMUNITY): Payer: Self-pay

## 2022-03-06 ENCOUNTER — Other Ambulatory Visit (HOSPITAL_COMMUNITY): Payer: Self-pay

## 2022-03-06 ENCOUNTER — Other Ambulatory Visit: Payer: Self-pay

## 2022-03-11 ENCOUNTER — Other Ambulatory Visit: Payer: Self-pay

## 2022-03-11 ENCOUNTER — Other Ambulatory Visit (HOSPITAL_COMMUNITY): Payer: Self-pay

## 2022-03-11 DIAGNOSIS — H5213 Myopia, bilateral: Secondary | ICD-10-CM | POA: Diagnosis not present

## 2022-03-11 LAB — HM DIABETES EYE EXAM

## 2022-03-13 ENCOUNTER — Other Ambulatory Visit (HOSPITAL_COMMUNITY): Payer: Self-pay

## 2022-03-13 ENCOUNTER — Other Ambulatory Visit: Payer: Self-pay | Admitting: Internal Medicine

## 2022-03-14 ENCOUNTER — Other Ambulatory Visit (HOSPITAL_COMMUNITY): Payer: Self-pay

## 2022-03-14 DIAGNOSIS — E230 Hypopituitarism: Secondary | ICD-10-CM | POA: Diagnosis not present

## 2022-03-14 DIAGNOSIS — N529 Male erectile dysfunction, unspecified: Secondary | ICD-10-CM | POA: Diagnosis not present

## 2022-03-14 MED ORDER — METFORMIN HCL ER 500 MG PO TB24
1000.0000 mg | ORAL_TABLET | Freq: Every day | ORAL | 1 refills | Status: DC
  Filled 2022-03-14: qty 180, 90d supply, fill #0
  Filled 2022-06-04: qty 180, 90d supply, fill #1

## 2022-03-14 NOTE — Telephone Encounter (Signed)
Yes

## 2022-03-14 NOTE — Telephone Encounter (Signed)
Pt's plan no longer covers metformin er '1000mg'$ , okay to switch to metformin er 500?

## 2022-03-26 DIAGNOSIS — G4733 Obstructive sleep apnea (adult) (pediatric): Secondary | ICD-10-CM | POA: Diagnosis not present

## 2022-04-04 ENCOUNTER — Other Ambulatory Visit (HOSPITAL_COMMUNITY): Payer: Self-pay

## 2022-04-04 ENCOUNTER — Encounter: Payer: Self-pay | Admitting: Internal Medicine

## 2022-04-04 MED ORDER — ROSUVASTATIN CALCIUM 10 MG PO TABS
10.0000 mg | ORAL_TABLET | Freq: Every day | ORAL | 1 refills | Status: DC
  Filled 2022-04-04: qty 90, 90d supply, fill #0

## 2022-04-11 DIAGNOSIS — R948 Abnormal results of function studies of other organs and systems: Secondary | ICD-10-CM | POA: Diagnosis not present

## 2022-04-16 ENCOUNTER — Encounter: Payer: Self-pay | Admitting: Internal Medicine

## 2022-04-17 ENCOUNTER — Other Ambulatory Visit (HOSPITAL_COMMUNITY): Payer: Self-pay

## 2022-04-17 ENCOUNTER — Telehealth: Payer: Self-pay

## 2022-04-17 MED ORDER — OZEMPIC (1 MG/DOSE) 4 MG/3ML ~~LOC~~ SOPN
1.0000 mg | PEN_INJECTOR | SUBCUTANEOUS | 0 refills | Status: DC
  Filled 2022-04-17 – 2022-04-29 (×2): qty 3, 28d supply, fill #0
  Filled 2022-05-31: qty 3, 28d supply, fill #1
  Filled 2022-07-01: qty 3, 28d supply, fill #2

## 2022-04-17 NOTE — Telephone Encounter (Signed)
PA initiated via Covermymeds; KEY: [id number]. Awaiting determination.

## 2022-04-17 NOTE — Telephone Encounter (Signed)
PA approved.   The request has been approved. The authorization is effective from 04/17/2022 to 04/17/2023, as long as the member is enrolled in their current health plan. A written notification letter will follow with additional details.. Authorization Expiration Date: April 17, 2023.

## 2022-04-22 ENCOUNTER — Other Ambulatory Visit (HOSPITAL_COMMUNITY): Payer: Self-pay

## 2022-04-24 DIAGNOSIS — G4733 Obstructive sleep apnea (adult) (pediatric): Secondary | ICD-10-CM | POA: Diagnosis not present

## 2022-04-25 ENCOUNTER — Other Ambulatory Visit (HOSPITAL_COMMUNITY): Payer: Self-pay

## 2022-04-29 ENCOUNTER — Other Ambulatory Visit (HOSPITAL_COMMUNITY): Payer: Self-pay

## 2022-04-30 ENCOUNTER — Other Ambulatory Visit (HOSPITAL_COMMUNITY): Payer: Self-pay

## 2022-05-01 ENCOUNTER — Other Ambulatory Visit (HOSPITAL_COMMUNITY): Payer: Self-pay

## 2022-05-02 ENCOUNTER — Other Ambulatory Visit (HOSPITAL_COMMUNITY): Payer: Self-pay

## 2022-05-02 MED ORDER — XYOSTED 75 MG/0.5ML ~~LOC~~ SOAJ
75.0000 mg | SUBCUTANEOUS | 5 refills | Status: DC
  Filled 2022-05-02 – 2022-05-31 (×4): qty 2, 28d supply, fill #0
  Filled 2022-07-01: qty 2, 28d supply, fill #1

## 2022-05-08 ENCOUNTER — Other Ambulatory Visit (HOSPITAL_COMMUNITY): Payer: Self-pay

## 2022-05-13 ENCOUNTER — Other Ambulatory Visit: Payer: Self-pay | Admitting: Internal Medicine

## 2022-05-14 ENCOUNTER — Other Ambulatory Visit (HOSPITAL_COMMUNITY): Payer: Self-pay

## 2022-05-15 ENCOUNTER — Other Ambulatory Visit (HOSPITAL_COMMUNITY): Payer: Self-pay

## 2022-05-21 ENCOUNTER — Other Ambulatory Visit (HOSPITAL_COMMUNITY): Payer: Self-pay

## 2022-05-21 ENCOUNTER — Other Ambulatory Visit: Payer: Self-pay

## 2022-05-22 ENCOUNTER — Other Ambulatory Visit: Payer: Self-pay

## 2022-05-22 ENCOUNTER — Other Ambulatory Visit (HOSPITAL_COMMUNITY): Payer: Self-pay

## 2022-05-23 ENCOUNTER — Other Ambulatory Visit (HOSPITAL_COMMUNITY): Payer: Self-pay

## 2022-05-25 ENCOUNTER — Other Ambulatory Visit (HOSPITAL_COMMUNITY): Payer: Self-pay

## 2022-05-25 DIAGNOSIS — G4733 Obstructive sleep apnea (adult) (pediatric): Secondary | ICD-10-CM | POA: Diagnosis not present

## 2022-05-27 ENCOUNTER — Other Ambulatory Visit (HOSPITAL_COMMUNITY): Payer: Self-pay

## 2022-05-28 ENCOUNTER — Other Ambulatory Visit (HOSPITAL_COMMUNITY): Payer: Self-pay

## 2022-05-29 DIAGNOSIS — I872 Venous insufficiency (chronic) (peripheral): Secondary | ICD-10-CM | POA: Diagnosis not present

## 2022-05-29 DIAGNOSIS — L400 Psoriasis vulgaris: Secondary | ICD-10-CM | POA: Diagnosis not present

## 2022-05-29 DIAGNOSIS — Z79899 Other long term (current) drug therapy: Secondary | ICD-10-CM | POA: Diagnosis not present

## 2022-05-31 ENCOUNTER — Other Ambulatory Visit (HOSPITAL_COMMUNITY): Payer: Self-pay

## 2022-06-03 ENCOUNTER — Other Ambulatory Visit (HOSPITAL_COMMUNITY): Payer: Self-pay

## 2022-06-05 ENCOUNTER — Encounter: Payer: Self-pay | Admitting: Podiatry

## 2022-06-05 ENCOUNTER — Ambulatory Visit: Payer: Commercial Managed Care - PPO | Admitting: Podiatry

## 2022-06-05 ENCOUNTER — Other Ambulatory Visit (HOSPITAL_COMMUNITY): Payer: Self-pay

## 2022-06-05 DIAGNOSIS — L853 Xerosis cutis: Secondary | ICD-10-CM | POA: Diagnosis not present

## 2022-06-05 DIAGNOSIS — E119 Type 2 diabetes mellitus without complications: Secondary | ICD-10-CM

## 2022-06-05 DIAGNOSIS — L600 Ingrowing nail: Secondary | ICD-10-CM | POA: Diagnosis not present

## 2022-06-05 NOTE — Progress Notes (Signed)
  Subjective:  Patient ID: Julian Washington, male    DOB: 22-Oct-1980,   MRN: [id number]  Chief Complaint  Patient presents with   Ingrown Toenail    Patient removed ingrown on the right great hallux himself, noticed the toe being tender so decided to come in, left foot, heel is cracked and would like to have it evaluated     42 y.o. male presents for concerns as above. Patient is diabetic and last A1c was  Lab Results  Component Value Date   HGBA1C 5.9 01/24/2022   .   PCP:  Wanda Plump, MD    . Denies any other pedal complaints. Denies n/v/f/c.   Past Medical History:  Diagnosis Date   ADHD    Anemia    Anxiety and depression    h/o suicidality w/ citalopram   Atrial fibrillation    Diabetes mellitus without complication    Difficult intubation    ETD (eustachian tube dysfunction)    s/p ENT, declined ear tuves before    GERD (gastroesophageal reflux disease)    History of chicken pox    Titered on 08/16/2005   Hypogonadism male    Dr Elvera Lennox, used to see urology   Infertility male    Internal hemorrhoids    s/p banding   Low testosterone    Migraine    on topamax   OSA on CPAP    Palpitations    Pituitary mass    h/o increased prolactin, Dr Elvera Lennox (previously @ [facility])   Prediabetes    A1C 6.2 years ago   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 CFT <3 seconds. Normal hair growth on digits. No edema.  Skin. No lacerations or abrasions bilateral feet. Right great toe no incurvation of nail noted. There is some thickened skin on the inside border of the nail fold. Cracking and hyperkeratosis noted to bilateral heels.  Musculoskeletal: MMT 5/5 bilateral lower extremities in DF, PF, Inversion and Eversion. Deceased ROM in DF of ankle joint.  Neurological: Sensation intact to light touch.   Assessment:   1. Ingrown right  greater toenail   2. Xerosis of skin   3. Type 2 diabetes mellitus without complication, without long-term current use of insulin      Plan:  Patient was evaluated and treated and all questions answered. Discussed ingrown toenails etiology and treatment options including procedure for removal vs conservative care.  Nail debrided in slant back fashion today to patient comfort.  Advised on epsom salt and warm water soaks and keeping neosporin and bandaid.  Suggest [place] healthy feet for cracked heels Return as needed.    Louann Sjogren, DPM

## 2022-06-11 ENCOUNTER — Other Ambulatory Visit (HOSPITAL_COMMUNITY): Payer: Self-pay

## 2022-06-12 DIAGNOSIS — G4733 Obstructive sleep apnea (adult) (pediatric): Secondary | ICD-10-CM | POA: Diagnosis not present

## 2022-06-19 ENCOUNTER — Encounter: Payer: 59 | Admitting: Internal Medicine

## 2022-06-24 DIAGNOSIS — G4733 Obstructive sleep apnea (adult) (pediatric): Secondary | ICD-10-CM | POA: Diagnosis not present

## 2022-07-01 ENCOUNTER — Other Ambulatory Visit (HOSPITAL_COMMUNITY): Payer: Self-pay

## 2022-07-02 ENCOUNTER — Other Ambulatory Visit (HOSPITAL_COMMUNITY): Payer: Self-pay

## 2022-07-05 ENCOUNTER — Other Ambulatory Visit (HOSPITAL_COMMUNITY): Payer: Self-pay

## 2022-07-17 ENCOUNTER — Other Ambulatory Visit: Payer: Self-pay

## 2022-07-19 DIAGNOSIS — E291 Testicular hypofunction: Secondary | ICD-10-CM | POA: Diagnosis not present

## 2022-07-19 LAB — PSA: PSA: 0.26

## 2022-07-25 DIAGNOSIS — G4733 Obstructive sleep apnea (adult) (pediatric): Secondary | ICD-10-CM | POA: Diagnosis not present

## 2022-07-26 ENCOUNTER — Other Ambulatory Visit (HOSPITAL_COMMUNITY): Payer: Self-pay

## 2022-07-26 DIAGNOSIS — E230 Hypopituitarism: Secondary | ICD-10-CM | POA: Diagnosis not present

## 2022-07-26 MED ORDER — ANASTROZOLE 1 MG PO TABS
1.0000 mg | ORAL_TABLET | ORAL | 2 refills | Status: DC
  Filled 2022-07-26: qty 12, 84d supply, fill #0
  Filled 2022-10-15: qty 12, 84d supply, fill #1

## 2022-07-26 MED ORDER — XYOSTED 100 MG/0.5ML ~~LOC~~ SOAJ
100.0000 mg | SUBCUTANEOUS | 5 refills | Status: DC
  Filled 2022-07-26: qty 2, 28d supply, fill #0
  Filled 2022-08-26: qty 2, 28d supply, fill #1
  Filled 2022-09-23: qty 2, 28d supply, fill #2
  Filled 2022-10-22: qty 2, 28d supply, fill #3

## 2022-07-27 NOTE — Progress Notes (Unsigned)
  Patient seen by another provider

## 2022-07-28 NOTE — Progress Notes (Unsigned)
Cardiology Office Note Date:  07/29/2022  Patient ID:  Julian Washington, Julian Washington [birth year], MRN [id number] PCP:  Wanda Plump, MD  Cardiologist:  Dr. Excell Seltzer Electrophysiologist: Dr. Lalla Brothers   Chief Complaint: 6 Month Follow Up  History of Present Illness: Julian Washington is a 42 y.o. male with history of OSA on CPAP, hypogonadotropic hypogonadism, pituitary mass s/p adenoma removal 2014, DM, HTN, morbid obesity, & Atrial Fibrillation.   Cardiac Hx:  Initially established with Cardiology in 2018 for palpitations & was started on a BB. Monitoring showed PAC's, rare PVC's.  Followed up in 2022 for syncopal event that occurred while walking to the restroom. EMS found AFwRVR, HR 200. He was started on anti-coagulation in anticipation for TEE/DCCV.  He was felt not to be an ablation candidate due to morbid obesity.  Had spontaneous conversion.  He was started on Flecainide 11/2020. CT noted no CAD or calcium. He saw Dr. Lalla Brothers in 2023 & was in SR. Seen in 08/2021 for EP weigh in on stimulant therapy for ADHD therapy. BP was uncontrolled, losartan was added. In 11/2021, he had felt he had RVR after the influenza vaccine. In December 2023, he was seen and was working on weight loss efforts with Ozempic, seeking hormonal therapy in an attempt to have children & his BP was elevated.   OV 6/17 for routine follow up.  He is currently maintained on eliquis, toprol and flecainide for AF. He reports is doing well. Notes he and his wife have stopped with infertility efforts.  His Urologist wants to start him on Anastrozole.  He is taking testosterone.  He has not experienced any episodes of AF, pre-syncope/syncope.  Denies episodes of bleeding with eliquis. Notes he has constipation with Ozempic that leads to significant fullness that can cause chest discomfort but he will belch and relieve the process. Denies shortness of breath, palpitations, chest discomfort. He is to see his PCP next week for annual lab follow up.     Past Medical History:  Diagnosis Date   ADHD    Anemia    Anxiety and depression    h/o suicidality w/ citalopram   Atrial fibrillation (HCC)    Diabetes mellitus without complication (HCC)    Difficult intubation    ETD (eustachian tube dysfunction)    s/p ENT, declined ear tuves before    GERD (gastroesophageal reflux disease)    History of chicken pox    Titered on 08/16/2005   Hypogonadism male    Dr Elvera Lennox, used to see urology   Infertility male    Internal hemorrhoids    s/p banding   Low testosterone    Migraine    on topamax   OSA on CPAP    Palpitations    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HCC)    h/o increased prolactin, Dr Elvera Lennox (previously @ [facility])   Prediabetes    A1C 6.2 years ago   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   Vitamin B 12 deficiency    h/o   Vitamin D deficiency     Past Surgical History:  Procedure Laterality Date   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-04-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, ACTH   TONSILLECTOMY AND ADENOIDECTOMY      Current Outpatient Medications  Medication Sig Dispense Refill   ACCU-CHEK FASTCLIX LANCETS MISC 1 Package by Does not apply route 2 (two) times daily. 100 each 0   acetaminophen (TYLENOL) 500 MG tablet Take 500-1,000 mg by mouth every  6 (six) hours as needed for moderate pain or headache.     anastrozole (ARIMIDEX) 1 MG tablet Take 1 tablet (1 mg) by mouth once a week. 12 tablet 2   apixaban (ELIQUIS) 5 MG TABS tablet Take 1 tablet (5 mg total) by mouth 2 (two) times daily. 180 tablet 2   atomoxetine (STRATTERA) 100 MG capsule Take 100 mg by mouth daily.     atomoxetine (STRATTERA) 100 MG capsule Take 1 capsule (100 mg total) by mouth daily. 90 capsule 0   augmented betamethasone dipropionate (DIPROLENE-AF) 0.05 % cream SMARTSIG:1 sparingly Topical Twice Daily     ELIQUIS 5 MG TABS tablet TAKE 1 TABLET TWICE A DAY 180 tablet 3   EPINEPHrine (EPIPEN 2-PAK) 0.3 mg/0.3 mL IJ SOAJ injection Inject 0.3 mg into the muscle as needed. 1  each 1   EPINEPHrine 0.3 mg/0.3 mL IJ SOAJ injection Inject 0.3 mg into the muscle as needed 2 each 0   fexofenadine (ALLEGRA) 180 MG tablet Take 180 mg by mouth daily.     flecainide (TAMBOCOR) 100 MG tablet TAKE 1 TABLET EVERY 12 HOURS 180 tablet 3   flecainide (TAMBOCOR) 100 MG tablet Take 1 tablet (100 mg total) by mouth every 12 (twelve) hours. 180 tablet 1   fluticasone (FLONASE) 50 MCG/ACT nasal spray Place 2 sprays into both nostrils daily. 16 g 6   glucose blood (ACCU-CHEK GUIDE) test strip 1 each by Other route 2 (two) times daily. Use as instructed 100 each 0   losartan (COZAAR) 50 MG tablet Take 1 tablet (50 mg total) by mouth daily. 90 tablet 0   Magnesium Oxide (MAG-OXIDE PO) Take 400 mg by mouth at bedtime.     metFORMIN (GLUCOPHAGE-XR) 500 MG 24 hr tablet Take 2 tablets (1,000 mg total) by mouth daily with breakfast. 180 tablet 1   metoprolol succinate (TOPROL-XL) 50 MG 24 hr tablet TAKE 1 TABLET TWICE A DAY WITH OR IMMEDIATELY FOLLOWING A MEAL 180 tablet 3   metoprolol succinate (TOPROL-XL) 50 MG 24 hr tablet Take 1 tablet (50 mg total) by mouth 2 (two) times daily with or immediately following a meal 180 tablet 1   Multiple Vitamin (MULTIVITAMIN WITH MINERALS) TABS tablet Take 1 tablet by mouth daily.     omeprazole (PRILOSEC) 40 MG capsule Take 1 capsule (40 mg total) by mouth daily before breakfast. 90 capsule 2   Risankizumab-rzaa (SKYRIZI PEN) 150 MG/ML SOAJ Inject 1 pen injector subcutaneously every three months 1 mL 3   rosuvastatin (CRESTOR) 10 MG tablet Take 1 tablet (10 mg total) by mouth at bedtime. 90 tablet 1   Semaglutide, 1 MG/DOSE, (OZEMPIC, 1 MG/DOSE,) 4 MG/3ML SOPN Inject 1 mg into the skin once a week. 9 mL 0   SKYRIZI, 150 MG DOSE, 75 MG/0.83ML PSKT Inject 150 mg into the skin See admin instructions. Every 3 months     tadalafil (CIALIS) 5 MG tablet Take 2 tablets by mouth daily. (10 mg Total)     Testosterone Enanthate (XYOSTED) 100 MG/0.5ML SOAJ Inject 0.5  mLs (100 mg) into the skin once a week. 2 mL 5   tiZANidine (ZANAFLEX) 4 MG capsule Take 1 capsule (4 mg total) by mouth 3 (three) times daily. 21 capsule 0   No current facility-administered medications for this visit.    Allergies:   Influenza virus vaccine, Levaquin [levofloxacin in d5w], Celexa [citalopram], Dextrans, Hydrocodone-acetaminophen, Latex, Oxycodone, and Shiitake mushroom   Social History:  The patient  reports that he has  never smoked. He has never used smokeless tobacco. He reports that he does not currently use alcohol. He reports that he does not use drugs.   Family History:  The patient's family history includes Anxiety disorder in his father; Bipolar disorder in his father; CAD in an other family member; Cancer in his maternal grandfather and paternal grandfather; Depression in his father; Diabetes in his paternal grandfather and paternal grandmother; Drug abuse in his father; Hypertension in his paternal grandmother; Obesity in his mother; Prostate cancer in an other family member.   ROS:  Please see the history of present illness.    All other systems are reviewed and otherwise negative.   PHYSICAL EXAM:  VS:  BP (!) 128/94 (BP Location: Right Arm, Cuff Size: Large)   Pulse 78   Ht 5\' 11"  (1.803 m)   Wt (!) 378 lb (171.5 kg)   SpO2 99%   BMI 52.72 kg/m  BMI: Body mass index is 52.72 kg/m.  General: pleasant adult male sitting up on exam table in NAD  HEENT: MM pink/moist, anicteric, speech clear Neuro: AAOx4, MAE/normal strength CV: s1s2 RRR, no m/r/g PULM:  non-labored at rest, lungs bilaterally clear with good air entry, no wheezing  GI: soft, bsx4 active  Extremities: warm/dry, chronic BLE edema 1-2+ with changes consistent with venous stasis  Skin: no rashes or lesions   EKG:   EKG 01/2022 > NSR with stable QT EKG 07/28/2021 > personally reviewed and shows NSR with stable PR/QRS intervals  Recent Labs: 10/02/2021: ALT 15; BUN 13; Creatinine, Ser 0.79;  Potassium 4.3; Sodium 141 01/24/2022: TSH 2.36  10/02/2021: Cholesterol 87; HDL 40.00; LDL Cholesterol 33; Total CHOL/HDL Ratio 2; Triglycerides 72.0; VLDL 14.4   CrCl cannot be calculated (Patient's most recent lab result is older than the maximum 21 days allowed.).   Wt Readings from Last 3 Encounters:  07/29/22 (!) 378 lb (171.5 kg)  02/08/22 (!) 363 lb 11.2 oz (165 kg)  01/25/22 (!) 352 lb 3.2 oz (159.8 kg)     Other studies reviewed: Additional studies/records reviewed today include: summarized above  ASSESSMENT AND PLAN:  Paroxysmal Atrial Fibrillation Hx PVC's  Initially dx 2022 with RVR, syncopal episode.  On Flecainide since 2022, eliquis. CHA2S2VASC 2.  -continue Flecainide  -continue Eliquis, appropriately dosed -continue Toprol 50  -EKG with stable intervals -no drug-drug interactions with anastrozole and flecainide   Secondary Hypercoagulable State -continue eliquis as above   HTN -continue losartan 50mg  daily  -BP controlled, recheck in office 128/94 (initial 152/80)  Chronic Edema  -stable per patient / unchanged   Disposition: F/u with Dr. Lalla Brothers in 6 months   Current medicines are reviewed at length with the patient today.  The patient did not have any concerns regarding medicines.  Signed, Canary Brim, MSN, APRN, NP-C, AGACNP-BC [facility] - Electrophysiology  07/29/2022, 9:59 AM

## 2022-07-29 ENCOUNTER — Encounter: Payer: Self-pay | Admitting: Internal Medicine

## 2022-07-29 ENCOUNTER — Ambulatory Visit: Payer: Commercial Managed Care - PPO | Attending: Physician Assistant | Admitting: Pulmonary Disease

## 2022-07-29 ENCOUNTER — Encounter: Payer: Self-pay | Admitting: Pulmonary Disease

## 2022-07-29 VITALS — BP 128/94 | HR 78 | Ht 71.0 in | Wt 378.0 lb

## 2022-07-29 DIAGNOSIS — I4891 Unspecified atrial fibrillation: Secondary | ICD-10-CM

## 2022-07-29 DIAGNOSIS — Z79899 Other long term (current) drug therapy: Secondary | ICD-10-CM

## 2022-07-29 DIAGNOSIS — D6869 Other thrombophilia: Secondary | ICD-10-CM

## 2022-07-29 DIAGNOSIS — I10 Essential (primary) hypertension: Secondary | ICD-10-CM | POA: Diagnosis not present

## 2022-07-29 DIAGNOSIS — I872 Venous insufficiency (chronic) (peripheral): Secondary | ICD-10-CM | POA: Diagnosis not present

## 2022-07-29 NOTE — Patient Instructions (Signed)
Medication Instructions:   Your physician recommends that you continue on your current medications as directed. Please refer to the Current Medication list given to you today.   *If you need a refill on your cardiac medications before your next appointment, please call your pharmacy*   Lab Work: NONE ORDERED  TODAY    If you have labs (blood work) drawn today and your tests are completely normal, you will receive your results only by: MyChart Message (if you have MyChart) OR A paper copy in the mail If you have any lab test that is abnormal or we need to change your treatment, we will call you to review the results.   Testing/Procedures: NONE ORDERED  TODAY    Follow-Up: At [facility], you and your health needs are our priority.  As part of our continuing mission to provide you with exceptional heart care, we have created designated Provider Care Teams.  These Care Teams include your primary Cardiologist (physician) and Advanced Practice Providers (APPs -  Physician Assistants and Nurse Practitioners) who all work together to provide you with the care you need, when you need it.  We recommend signing up for the patient portal called "MyChart".  Sign up information is provided on this After Visit Summary.  MyChart is used to connect with patients for Virtual Visits (Telemedicine).  Patients are able to view lab/test results, encounter notes, upcoming appointments, etc.  Non-urgent messages can be sent to your provider as well.   To learn more about what you can do with MyChart, go to [email or web address].    Your next appointment:   6 month(s)  Provider:   Cameron Lambert, MD    Other Instructions  

## 2022-07-30 ENCOUNTER — Other Ambulatory Visit (HOSPITAL_COMMUNITY): Payer: Self-pay

## 2022-08-01 ENCOUNTER — Other Ambulatory Visit: Payer: Self-pay

## 2022-08-01 ENCOUNTER — Other Ambulatory Visit (HOSPITAL_COMMUNITY): Payer: Self-pay

## 2022-08-02 ENCOUNTER — Other Ambulatory Visit (HOSPITAL_COMMUNITY): Payer: Self-pay

## 2022-08-02 ENCOUNTER — Encounter: Payer: Self-pay | Admitting: Internal Medicine

## 2022-08-02 ENCOUNTER — Telehealth: Payer: Self-pay

## 2022-08-02 ENCOUNTER — Ambulatory Visit (INDEPENDENT_AMBULATORY_CARE_PROVIDER_SITE_OTHER): Payer: Commercial Managed Care - PPO | Admitting: Internal Medicine

## 2022-08-02 VITALS — BP 134/88 | HR 76 | Temp 98.0°F | Resp 18 | Ht 71.0 in | Wt 375.2 lb

## 2022-08-02 DIAGNOSIS — E538 Deficiency of other specified B group vitamins: Secondary | ICD-10-CM

## 2022-08-02 DIAGNOSIS — E559 Vitamin D deficiency, unspecified: Secondary | ICD-10-CM

## 2022-08-02 DIAGNOSIS — I872 Venous insufficiency (chronic) (peripheral): Secondary | ICD-10-CM | POA: Diagnosis not present

## 2022-08-02 DIAGNOSIS — Z7985 Long-term (current) use of injectable non-insulin antidiabetic drugs: Secondary | ICD-10-CM | POA: Diagnosis not present

## 2022-08-02 DIAGNOSIS — E119 Type 2 diabetes mellitus without complications: Secondary | ICD-10-CM

## 2022-08-02 DIAGNOSIS — I10 Essential (primary) hypertension: Secondary | ICD-10-CM | POA: Diagnosis not present

## 2022-08-02 DIAGNOSIS — E785 Hyperlipidemia, unspecified: Secondary | ICD-10-CM | POA: Diagnosis not present

## 2022-08-02 DIAGNOSIS — Z0001 Encounter for general adult medical examination with abnormal findings: Secondary | ICD-10-CM

## 2022-08-02 DIAGNOSIS — Z7984 Long term (current) use of oral hypoglycemic drugs: Secondary | ICD-10-CM

## 2022-08-02 DIAGNOSIS — Z0000 Encounter for general adult medical examination without abnormal findings: Secondary | ICD-10-CM

## 2022-08-02 LAB — COMPREHENSIVE METABOLIC PANEL
ALT: 14 U/L (ref 0–53)
AST: 13 U/L (ref 0–37)
Albumin: 4.3 g/dL (ref 3.5–5.2)
Alkaline Phosphatase: 55 U/L (ref 39–117)
BUN: 13 mg/dL (ref 6–23)
CO2: 27 mEq/L (ref 19–32)
Calcium: 9.1 mg/dL (ref 8.4–10.5)
Chloride: 103 mEq/L (ref 96–112)
Creatinine, Ser: 0.76 mg/dL (ref 0.40–1.50)
GFR: 111.78 mL/min (ref >60.00)
Glucose, Bld: 96 mg/dL (ref 70–99)
Potassium: 4.4 mEq/L (ref 3.5–5.1)
Sodium: 137 mEq/L (ref 135–145)
Total Bilirubin: 0.6 mg/dL (ref 0.2–1.2)
Total Protein: 6.6 g/dL (ref 6.0–8.3)

## 2022-08-02 LAB — CBC WITH DIFFERENTIAL/PLATELET
Basophils Absolute: 0 10*3/uL (ref 0.0–0.1)
Basophils Relative: 0.7 % (ref 0.0–3.0)
Eosinophils Absolute: 0.1 10*3/uL (ref 0.0–0.7)
Eosinophils Relative: 1.7 % (ref 0.0–5.0)
HCT: 41.7 % (ref 39.0–52.0)
Hemoglobin: 13.5 g/dL (ref 13.0–17.0)
Lymphocytes Relative: 27.8 % (ref 12.0–46.0)
Lymphs Abs: 2 10*3/uL (ref 0.7–4.0)
MCHC: 32.4 g/dL (ref 30.0–36.0)
MCV: 81.3 fl (ref 78.0–100.0)
Monocytes Absolute: 0.3 10*3/uL (ref 0.1–1.0)
Monocytes Relative: 4.8 % (ref 3.0–12.0)
Neutro Abs: 4.6 10*3/uL (ref 1.4–7.7)
Neutrophils Relative %: 65 % (ref 43.0–77.0)
Platelets: 181 10*3/uL (ref 150.0–400.0)
RBC: 5.13 Mil/uL (ref 4.22–5.81)
RDW: 14.2 % (ref 11.5–15.5)
WBC: 7.1 10*3/uL (ref 4.0–10.5)

## 2022-08-02 LAB — VITAMIN D 25 HYDROXY (VIT D DEFICIENCY, FRACTURES): VITD: 32.16 ng/mL (ref 30.00–100.00)

## 2022-08-02 LAB — B12 AND FOLATE PANEL
Folate: 12.2 ng/mL (ref >5.9)
Vitamin B-12: 216 pg/mL (ref 211–911)

## 2022-08-02 LAB — MICROALBUMIN / CREATININE URINE RATIO
Creatinine,U: 129.7 mg/dL
Microalb Creat Ratio: 4 mg/g (ref 0.0–30.0)
Microalb, Ur: 5.1 mg/dL — ABNORMAL HIGH (ref 0.0–1.9)

## 2022-08-02 LAB — LIPID PANEL
Cholesterol: 77 mg/dL (ref 0–200)
HDL: 31.1 mg/dL — ABNORMAL LOW (ref >39.00)
LDL Cholesterol: 31 mg/dL (ref 0–99)
NonHDL: 46.02
Total CHOL/HDL Ratio: 2
Triglycerides: 74 mg/dL (ref 0.0–149.0)
VLDL: 14.8 mg/dL (ref 0.0–40.0)

## 2022-08-02 LAB — HEMOGLOBIN A1C: Hgb A1c MFr Bld: 5.5 % (ref 4.6–6.5)

## 2022-08-02 MED ORDER — SEMAGLUTIDE (2 MG/DOSE) 8 MG/3ML ~~LOC~~ SOPN
2.0000 mg | PEN_INJECTOR | SUBCUTANEOUS | 3 refills | Status: DC
  Filled 2022-08-02: qty 3, 28d supply, fill #0
  Filled 2022-08-26: qty 3, 28d supply, fill #1
  Filled 2022-09-23: qty 3, 28d supply, fill #2
  Filled 2022-10-22: qty 3, 28d supply, fill #3

## 2022-08-02 NOTE — Assessment & Plan Note (Signed)
-  Tdap 2023 - PNM 23: 10/2018 -COVID vaccine- 01/2022 (@ allergist office d/t a h/o a reaction) - CCS: no FH . Never had a cscope   -Labs:  CMP FLP CBC A1c micro - Wt gain noted, encouraged to go back to the wellness center, pt also thinking about bariatric surgery

## 2022-08-02 NOTE — Patient Instructions (Addendum)
Vaccines I recommend: Flu shot this fall  Increase Ozempic to 2 mg weekly. For constipation: Increase fruits and vegetables, you could use Colace or MiraLAX as needed     GO TO THE LAB : Get the blood work     GO TO THE FRONT DESK, PLEASE SCHEDULE YOUR APPOINTMENTS Come back for checkup in 4 months

## 2022-08-02 NOTE — Progress Notes (Signed)
Subjective:    Patient ID: Julian Washington, male    DOB: [birth year], 42 y.o.   MRN: [id number]  DOS:  08/02/2022 Type of visit - description: CPX  Here for CPX. Chronic medical problems were addressed. Saw other practitioners, notes reviewed. In general feels well. Morbid obesity: Weight is about the same.  Unable to exercise much other than walking the dog. History of DM, denies lower extremity paresthesias.  Wt Readings from Last 3 Encounters:  08/02/22 (!) 375 lb 4 oz (170.2 kg)  07/29/22 (!) 378 lb (171.5 kg)  02/08/22 (!) 363 lb 11.2 oz (165 kg)   Review of Systems  Other than above, a 14 point review of systems is negative     Past Medical History:  Diagnosis Date   ADHD    Anemia    Anxiety and depression    h/o suicidality w/ citalopram   Atrial fibrillation (HCC)    Diabetes mellitus without complication (HCC)    Difficult intubation    ETD (eustachian tube dysfunction)    s/p ENT, declined ear tuves before    GERD (gastroesophageal reflux disease)    History of chicken pox    Titered on 08/16/2005   Hypogonadism male    Dr Elvera Lennox, used to see urology   Infertility male    Internal hemorrhoids    s/p banding   Low testosterone    Migraine    on topamax   OSA on CPAP    Palpitations    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HCC)    h/o increased prolactin, Dr Elvera Lennox (previously @ [facility])   Prediabetes    A1C 6.2 years ago   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   Vitamin B 12 deficiency    h/o   Vitamin D deficiency     Past Surgical History:  Procedure Laterality Date   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-04-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, ACTH   TONSILLECTOMY AND ADENOIDECTOMY     Social History   Socioeconomic History   Marital status: Married    Spouse name: Not on file   Number of children: 0   Years of education: Not on file   Highest education level: Not on file  Occupational History   Occupation: Charity fundraiser, 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    Employer:  [employer]   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  Tobacco Use   Smoking status: Never   Smokeless tobacco: Never  Vaping Use   Vaping Use: Never used  Substance and Sexual Activity   Alcohol use: Not Currently    Comment: none x 10 yrs   Drug use: No   Sexual activity: Not on file  Other Topics Concern   Not on file  Social History Narrative   Lives w/ wife   1 dog    Social Determinants of Health   Financial Resource Strain: Not on file  Food Insecurity: Not on file  Transportation Needs: Not on file  Physical Activity: Not on file  Stress: Not on file  Social Connections: Not on file  Intimate Partner Violence: Not on file    Current Outpatient Medications  Medication Instructions   ACCU-CHEK FASTCLIX LANCETS MISC 1 Package, Does not apply, 2 times daily   acetaminophen (TYLENOL) 500-1,000 mg, Oral, Every 6 hours PRN   anastrozole (ARIMIDEX) 1 MG tablet Take 1 tablet (1 mg) by mouth once a week.   atomoxetine (STRATTERA) 100 mg, Oral, Daily   augmented betamethasone dipropionate (DIPROLENE-AF) 0.05 %  cream SMARTSIG:1 sparingly Topical Twice Daily   Eliquis 5 mg, Oral, 2 times daily   Eliquis 5 mg, Oral, 2 times daily   EPINEPHrine (EPIPEN 2-PAK) 0.3 mg, Intramuscular, As needed   fexofenadine (ALLEGRA) 180 mg, Oral, Daily   flecainide (TAMBOCOR) 100 mg, Oral, Every 12 hours   fluticasone (FLONASE) 50 MCG/ACT nasal spray 2 sprays, Each Nare, Daily   glucose blood (ACCU-CHEK GUIDE) test strip 1 each, Other, 2 times daily, Use as instructed    losartan (COZAAR) 50 mg, Oral, Daily   Magnesium Oxide (MAG-OXIDE PO) 400 mg, Oral, Daily at bedtime   metFORMIN (GLUCOPHAGE-XR) 1,000 mg, Oral, Daily with breakfast   metoprolol succinate (TOPROL-XL) 50 MG 24 hr tablet TAKE 1 TABLET TWICE A DAY WITH OR IMMEDIATELY FOLLOWING A MEAL   omeprazole (PRILOSEC) 40 mg, Oral, Daily before breakfast   Risankizumab-rzaa (SKYRIZI PEN) 150 MG/ML SOAJ Inject 1 pen injector subcutaneously every  three months   rosuvastatin (CRESTOR) 10 mg, Oral, Daily at bedtime   Semaglutide (2 MG/DOSE) 2 mg, Injection, Weekly   tadalafil (CIALIS) 5 MG tablet 2 tablets, Daily   Testosterone Enanthate (XYOSTED) 100 MG/0.5ML SOAJ Inject 0.5 mLs (100 mg) into the skin once a week.       Objective:   Physical Exam BP 134/88   Pulse 76   Temp 98 F (36.7 C) (Oral)   Resp 18   Ht 5\' 11"  (1.803 m)   Wt (!) 375 lb 4 oz (170.2 kg)   SpO2 97%   BMI 52.34 kg/m  General: Well developed, NAD, BMI noted Neck: No  thyromegaly  HEENT:  Normocephalic . Face symmetric, atraumatic Lungs:  CTA B Normal respiratory effort, no intercostal retractions, no accessory muscle use. Heart: RRR,  no murmur.  Abdomen:  Not distended, soft, non-tender. No rebound or rigidity.   DM foot exam: Chronic skin changes at the pretibial area noted Edema at baseline Pinprick examination WNL Skin: Exposed areas without rash. Not pale. Not jaundice Neurologic:  alert & oriented X3.  Speech normal, gait appropriate for age and unassisted Strength symmetric and appropriate for age.  Psych: Cognition and judgment appear intact.  Cooperative with normal attention span and concentration.  Behavior appropriate. No anxious or depressed appearing.     Assessment     Assessment   DM Chronic venous insufficiency/lymphedema/stasis dermatitis PSYCH: --Anxiety depression (suicidality w/  Citalopram) --ADHD Dx 2019, Dr Elisabeth Most Morbid obesity Vitamin D and  B12 deficiency CV: New onset A. fib 11-2020.  Admitted  Calcium coronary score 0 (11-2020) Endocrinology:Dr Gherghe --Pituitary mass, surgery : Prolactinoma --Hypogonadism hypogonadotropic --Infertility Migraines, d/c Topamax 05-2015 (pt concerned about short term memory)  OSA on CPAP -- DR Craige Cotta Hematology -Mild chronic anemia, no previous colonoscopy or EGD. Iron , vitamins normal.  saw hematology 09/2017  -Splenomegaly: Per CT 09/20/2017, saw  hematology --Abdominal lymphadenopathies: Felt to be stable, see hematology note from July 14, 2019. Pulmonary nodules: Felt to be stable per last CT. DERM: Psoriasis  H/o ET  dysfunction, declined ear tubes H/o hemorrhoids s/p  banding H/o abd pain 2012 (w/u CT, [place], a HIDA scan), similar sx 02-2015 H/o  difficulty intubation    PLAN: Here for CPX -Tdap 2023 - PNM 23: 10/2018 -COVID vaccine- 01/2022 (@ allergist office d/t a h/o a reaction) - CCS: no FH . Never had a cscope   -Labs:  CMP FLP CBC A1c micro - Wt gain noted, encouraged to go back to the wellness center, pt also thinking  about bariatric surgery DM: Currently on metformin, Ozempic 1 mg weekly.  Weight gain noted, encouraged healthier diet, unable to exercise much.  Will check labs and increase Ozempic from 1 mg to 2 mg weekly.  Had some side effects, constipation but he is managing well. Chronic venous insufficiency, lymphedema, stasis dermatitis: at baseline. Morbid obesity, see comments under DM B12 and vitamin D deficiency: Checking labs, not on supplements at this point. History of pituitary  prolactinoma with pituitary apoplexy: LOV endocrinology 01/2022, follow-up in 1 year A-fib: Saw cardiology 07/29/22, continue flecainide, Eliquis, metoprolol. RTC 4 months

## 2022-08-02 NOTE — Telephone Encounter (Signed)
PA initiated via Covermymeds; KEY: [id number].  Member has an active PA on file which is expiring on 04/17/2023 and has 996 no. of fills remaining.

## 2022-08-02 NOTE — Assessment & Plan Note (Signed)
Here for CPX DM: Currently on metformin, Ozempic 1 mg weekly.  Weight gain noted, encouraged healthier diet, unable to exercise much.  Will check labs and increase Ozempic from 1 mg to 2 mg weekly.  Had some side effects, constipation but he is managing well. Chronic venous insufficiency, lymphedema, stasis dermatitis: at baseline. Morbid obesity, see comments under DM B12 and vitamin D deficiency: Checking labs, not on supplements at this point. History of pituitary  prolactinoma with pituitary apoplexy: LOV endocrinology 01/2022, follow-up in 1 year A-fib: Saw cardiology 07/29/22, continue flecainide, Eliquis, metoprolol. RTC 4 months

## 2022-08-08 ENCOUNTER — Other Ambulatory Visit (HOSPITAL_COMMUNITY): Payer: Self-pay

## 2022-08-22 ENCOUNTER — Other Ambulatory Visit: Payer: Self-pay | Admitting: Oncology

## 2022-08-22 ENCOUNTER — Other Ambulatory Visit (HOSPITAL_COMMUNITY): Payer: Self-pay

## 2022-08-22 DIAGNOSIS — Z006 Encounter for examination for normal comparison and control in clinical research program: Secondary | ICD-10-CM

## 2022-08-24 DIAGNOSIS — G4733 Obstructive sleep apnea (adult) (pediatric): Secondary | ICD-10-CM | POA: Diagnosis not present

## 2022-08-26 ENCOUNTER — Other Ambulatory Visit (HOSPITAL_COMMUNITY): Payer: Self-pay

## 2022-08-26 ENCOUNTER — Other Ambulatory Visit: Payer: Self-pay

## 2022-08-29 ENCOUNTER — Other Ambulatory Visit (HOSPITAL_COMMUNITY): Payer: Self-pay

## 2022-08-29 ENCOUNTER — Other Ambulatory Visit: Payer: Self-pay

## 2022-08-30 ENCOUNTER — Other Ambulatory Visit (HOSPITAL_COMMUNITY): Payer: Self-pay

## 2022-09-03 ENCOUNTER — Other Ambulatory Visit (HOSPITAL_COMMUNITY): Payer: Self-pay

## 2022-09-08 ENCOUNTER — Other Ambulatory Visit (HOSPITAL_COMMUNITY): Payer: Self-pay

## 2022-09-08 ENCOUNTER — Other Ambulatory Visit: Payer: Self-pay | Admitting: Physician Assistant

## 2022-09-08 ENCOUNTER — Other Ambulatory Visit: Payer: Self-pay | Admitting: Internal Medicine

## 2022-09-09 ENCOUNTER — Other Ambulatory Visit: Payer: Self-pay

## 2022-09-09 ENCOUNTER — Other Ambulatory Visit (HOSPITAL_COMMUNITY): Payer: Self-pay

## 2022-09-09 MED ORDER — METFORMIN HCL ER 500 MG PO TB24
1000.0000 mg | ORAL_TABLET | Freq: Every day | ORAL | 1 refills | Status: DC
  Filled 2022-09-09: qty 180, 90d supply, fill #0
  Filled 2022-12-08: qty 180, 90d supply, fill #1

## 2022-09-11 ENCOUNTER — Other Ambulatory Visit (HOSPITAL_COMMUNITY): Payer: Self-pay

## 2022-09-11 MED FILL — Losartan Potassium Tab 50 MG: ORAL | 90 days supply | Qty: 90 | Fill #0 | Status: AC

## 2022-09-13 DIAGNOSIS — G4733 Obstructive sleep apnea (adult) (pediatric): Secondary | ICD-10-CM | POA: Diagnosis not present

## 2022-09-23 ENCOUNTER — Other Ambulatory Visit (HOSPITAL_COMMUNITY): Payer: Self-pay

## 2022-09-24 ENCOUNTER — Other Ambulatory Visit (HOSPITAL_COMMUNITY): Payer: Self-pay

## 2022-09-24 DIAGNOSIS — G4733 Obstructive sleep apnea (adult) (pediatric): Secondary | ICD-10-CM | POA: Diagnosis not present

## 2022-10-04 ENCOUNTER — Telehealth: Payer: Commercial Managed Care - PPO | Admitting: Nurse Practitioner

## 2022-10-04 DIAGNOSIS — H6690 Otitis media, unspecified, unspecified ear: Secondary | ICD-10-CM | POA: Diagnosis not present

## 2022-10-04 MED ORDER — AMOXICILLIN-POT CLAVULANATE 875-125 MG PO TABS: 1.0000 | ORAL_TABLET | Freq: Two times a day (BID) | ORAL | 0 refills | Status: DC

## 2022-10-04 NOTE — Progress Notes (Signed)
E-Visit for Ear Pain - Acute Otitis Media   We are sorry that you are not feeling well. Here is how we plan to help!  Based on what you have shared with me it looks like you have Acute Otitis Media.  Acute Otitis Media is an infection of the middle or "inner" ear. This type of infection can cause redness, inflammation, and fluid buildup behind the tympanic membrane (ear drum).  The usual symptoms include: Earache/Pain Fever Upper respiratory symptoms Lack of energy/Fatigue/Malaise Slight hearing loss gradually worsening- if the inner ear fills with fluid What causes middle ear infections? Most middle ear infections occur when an infection such as a cold, leads to a build-up of mucus in the middle ear and causes the Eustachian tube (a thin tube that runs from the middle ear to the back of the nose) to become swollen or blocked.   This means mucus can't drain away properly, making it easier for an infection to spread into the middle ear.  How middle ear infections are treated: Most ear infections clear up within three to five days and don't need any specific treatment. If necessary, tylenol or ibuprofen should be used to relieve pain and a high temperature.  If you develop a fever higher than 102, or any significantly worsening symptoms, this could indicate a more serious infection moving to the middle/inner and needs face to face evaluation in an office by a provider.   Antibiotics aren't routinely used to treat middle ear infections, although they may occasionally be prescribed if symptoms persist or are particularly severe. Given your presentation,   I have prescribed Augmentin 875-125 mg one tablet by mouth twice a day for 10 days    Your symptoms should improve over the next 3 days and should resolve in about 7 days. Be sure to complete ALL of the prescription(s) given.  HOME CARE: Wash your hands frequently. If you are prescribed an ear drop, do not place the tip of the bottle on  your ear or touch it with your fingers. You can take Acetaminophen 650 mg every 4-6 hours as needed for pain.  If pain is severe or moderate, you can apply a heating pad (set on low) or hot water bottle (wrapped in a towel) to outer ear for 20 minutes.  This will also increase drainage.  GET HELP RIGHT AWAY IF: Fever is over 102.2 degrees. You develop progressive ear pain or hearing loss. Ear symptoms persist longer than 3 days after treatment.  MAKE SURE YOU: Understand these instructions. Will watch your condition. Will get help right away if you are not doing well or get worse.  Thank you for choosing an e-visit.  Your e-visit answers were reviewed by a board certified advanced clinical practitioner to complete your personal care plan. Depending upon the condition, your plan could have included both over the counter or prescription medications.  Please review your pharmacy choice. Make sure the pharmacy is open so you can pick up the prescription now. If there is a problem, you may contact your provider through [employer] and have the prescription routed to another pharmacy.  Your safety is important to [place]. If you have drug allergies check your prescription carefully.   For the next 24 hours you can use MyChart to ask questions about today's visit, request a non-urgent call back, or ask for a work or school excuse. You will get an email with a survey after your eVisit asking about your experience. We would appreciate  your feedback. I hope that your e-visit has been valuable and will aid in your recovery.  Meds ordered this encounter  Medications   amoxicillin-clavulanate (AUGMENTIN) 875-125 MG tablet    Sig: Take 1 tablet by mouth 2 (two) times daily.    Dispense:  20 tablet    Refill:  0     I spent approximately 5 minutes reviewing the patient's history, current symptoms and coordinating their care today.

## 2022-10-07 ENCOUNTER — Other Ambulatory Visit (HOSPITAL_COMMUNITY): Payer: Self-pay

## 2022-10-07 ENCOUNTER — Telehealth: Payer: Commercial Managed Care - PPO | Admitting: Physician Assistant

## 2022-10-07 ENCOUNTER — Other Ambulatory Visit: Payer: Self-pay | Admitting: Internal Medicine

## 2022-10-07 DIAGNOSIS — U071 COVID-19: Secondary | ICD-10-CM | POA: Diagnosis not present

## 2022-10-07 MED ORDER — MOLNUPIRAVIR EUA 200MG CAPSULE
4.0000 | ORAL_CAPSULE | Freq: Two times a day (BID) | ORAL | 0 refills | Status: AC
  Filled 2022-10-07: qty 40, 5d supply, fill #0

## 2022-10-07 MED ORDER — BENZONATATE 100 MG PO CAPS
100.0000 mg | ORAL_CAPSULE | Freq: Three times a day (TID) | ORAL | 0 refills | Status: DC | PRN
  Filled 2022-10-07: qty 30, 10d supply, fill #0

## 2022-10-07 MED ORDER — ROSUVASTATIN CALCIUM 10 MG PO TABS
10.0000 mg | ORAL_TABLET | Freq: Every day | ORAL | 1 refills | Status: DC
  Filled 2022-10-07: qty 90, 90d supply, fill #0
  Filled 2022-12-08 – 2022-12-31 (×2): qty 90, 90d supply, fill #1

## 2022-10-07 NOTE — Patient Instructions (Signed)
Julian Washington, thank you for joining Piedad Climes, PA-C for today's virtual visit.  While this provider is not your primary care provider (PCP), if your PCP is located in our provider database this encounter information will be shared with them immediately following your visit.   A [facility] MyChart account gives you access to today's visit and all your visits, tests, and labs performed at [facility] " click here if you don't have a [facility] MyChart account or go to [email or web address]  Consent: (Patient) Julian Washington provided verbal consent for this virtual visit at the beginning of the encounter.  Current Medications:  Current Outpatient Medications:    ACCU-CHEK FASTCLIX LANCETS MISC, 1 Package by Does not apply route 2 (two) times daily., Disp: 100 each, Rfl: 0   acetaminophen (TYLENOL) 500 MG tablet, Take 500-1,000 mg by mouth every 6 (six) hours as needed for moderate pain or headache., Disp: , Rfl:    amoxicillin-clavulanate (AUGMENTIN) 875-125 MG tablet, Take 1 tablet by mouth 2 (two) times daily., Disp: 20 tablet, Rfl: 0   anastrozole (ARIMIDEX) 1 MG tablet, Take 1 tablet (1 mg) by mouth once a week., Disp: 12 tablet, Rfl: 2   apixaban (ELIQUIS) 5 MG TABS tablet, Take 1 tablet (5 mg total) by mouth 2 (two) times daily., Disp: 180 tablet, Rfl: 2   atomoxetine (STRATTERA) 100 MG capsule, Take 1 capsule (100 mg total) by mouth daily., Disp: 90 capsule, Rfl: 0   augmented betamethasone dipropionate (DIPROLENE-AF) 0.05 % cream, SMARTSIG:1 sparingly Topical Twice Daily, Disp: , Rfl:    ELIQUIS 5 MG TABS tablet, TAKE 1 TABLET TWICE A DAY, Disp: 180 tablet, Rfl: 3   EPINEPHrine (EPIPEN 2-PAK) 0.3 mg/0.3 mL IJ SOAJ injection, Inject 0.3 mg into the muscle as needed. (Patient not taking: Reported on 08/02/2022), Disp: 1 each, Rfl: 1   fexofenadine (ALLEGRA) 180 MG tablet, Take 180 mg by mouth daily., Disp: , Rfl:    flecainide (TAMBOCOR) 100 MG tablet, TAKE 1  TABLET EVERY 12 HOURS, Disp: 180 tablet, Rfl: 3   fluticasone (FLONASE) 50 MCG/ACT nasal spray, Place 2 sprays into both nostrils daily., Disp: 16 g, Rfl: 6   glucose blood (ACCU-CHEK GUIDE) test strip, 1 each by Other route 2 (two) times daily. Use as instructed, Disp: 100 each, Rfl: 0   losartan (COZAAR) 50 MG tablet, Take 1 tablet (50 mg total) by mouth daily., Disp: 90 tablet, Rfl: 3   Magnesium Oxide (MAG-OXIDE PO), Take 400 mg by mouth at bedtime., Disp: , Rfl:    metFORMIN (GLUCOPHAGE-XR) 500 MG 24 hr tablet, Take 2 tablets (1,000 mg total) by mouth daily with breakfast., Disp: 180 tablet, Rfl: 1   metoprolol succinate (TOPROL-XL) 50 MG 24 hr tablet, TAKE 1 TABLET TWICE A DAY WITH OR IMMEDIATELY FOLLOWING A MEAL, Disp: 180 tablet, Rfl: 3   omeprazole (PRILOSEC) 40 MG capsule, Take 1 capsule (40 mg total) by mouth daily before breakfast., Disp: 90 capsule, Rfl: 2   risankizumab-rzaa (SKYRIZI PEN) 150 MG/ML pen, Inject 1 pen injector subcutaneously every three months, Disp: 1 mL, Rfl: 3   rosuvastatin (CRESTOR) 10 MG tablet, Take 1 tablet (10 mg total) by mouth at bedtime., Disp: 90 tablet, Rfl: 1   Semaglutide, 2 MG/DOSE, 8 MG/3ML SOPN, Inject 2 mg as directed once a week., Disp: 3 mL, Rfl: 3   tadalafil (CIALIS) 5 MG tablet, Take 2 tablets by mouth daily. (10 mg Total) (Patient not taking: Reported on 08/02/2022), Disp: ,  Rfl:    Testosterone Enanthate (XYOSTED) 100 MG/0.5ML SOAJ, Inject 0.5 mLs (100 mg) into the skin once a week., Disp: 2 mL, Rfl: 5   Medications ordered in this encounter:  No orders of the defined types were placed in this encounter.    *If you need refills on other medications prior to your next appointment, please contact your pharmacy*  Follow-Up: Call back or seek an in-person evaluation if the symptoms worsen or if the condition fails to improve as anticipated.  [facility] [phone]  Care Instructions: Please keep well-hydrated and get plenty  of rest. Start a saline nasal rinse to flush out your nasal passages. You can use plain Mucinex to help thin congestion. If you have a humidifier, running in the bedroom at night. I want you to start OTC vitamin D3 1000 units daily, vitamin C 1000 mg daily, and a zinc supplement. Please take prescribed medications as directed.      Isolation Instructions: You are to isolate at home until you have been fever free for at least 24 hours without a fever-reducing medication, and symptoms have been steadily improving for 24 hours. At that time,  you can end isolation but need to mask for an additional 5 days.   If you must be around other household members who do not have symptoms, you need to make sure that both you and the family members are masking consistently with a high-quality mask.  If you note any worsening of symptoms despite treatment, please seek an in-person evaluation ASAP. If you note any significant shortness of breath or any chest pain, please seek ER evaluation. Please do not delay care!   COVID-19: What to Do if You Are Sick If you test positive and are an older adult or someone who is at high risk of getting very sick from COVID-19, treatment may be available. Contact a healthcare provider right away after a positive test to determine if you are eligible, even if your symptoms are mild right now. You can also visit a Test to Treat location and, if eligible, receive a prescription from a provider. Don't delay: Treatment must be started within the first few days to be effective. If you have a fever, cough, or other symptoms, you might have COVID-19. Most people have mild illness and are able to recover at home. If you are sick: Keep track of your symptoms. If you have an emergency warning sign (including trouble breathing), call 911. Steps to help prevent the spread of COVID-19 if you are sick If you are sick with COVID-19 or think you might have COVID-19, follow the steps below  to care for yourself and to help protect other people in your home and community. Stay home except to get medical care Stay home. Most people with COVID-19 have mild illness and can recover at home without medical care. Do not leave your home, except to get medical care. Do not visit public areas and do not go to places where you are unable to wear a mask. Take care of yourself. Get rest and stay hydrated. Take over-the-counter medicines, such as acetaminophen, to help you feel better. Stay in touch with your doctor. Call before you get medical care. Be sure to get care if you have trouble breathing, or have any other emergency warning signs, or if you think it is an emergency. Avoid public transportation, ride-sharing, or taxis if possible. Get tested If you have symptoms of COVID-19, get tested. While waiting for  test results, stay away from others, including staying apart from those living in your household. Get tested as soon as possible after your symptoms start. Treatments may be available for people with COVID-19 who are at risk for becoming very sick. Don't delay: Treatment must be started early to be effective--some treatments must begin within 5 days of your first symptoms. Contact your healthcare provider right away if your test result is positive to determine if you are eligible. Self-tests are one of several options for testing for the virus that causes COVID-19 and may be more convenient than laboratory-based tests and point-of-care tests. Ask your healthcare provider or your local health department if you need help interpreting your test results. You can visit your state, tribal, local, and territorial health department's website to look for the latest local information on testing sites. Separate yourself from other people As much as possible, stay in a specific room and away from other people and pets in your home. If possible, you should use a separate bathroom. If you need to be around  other people or animals in or outside of the home, wear a well-fitting mask. Tell your close contacts that they may have been exposed to COVID-19. An infected person can spread COVID-19 starting 48 hours (or 2 days) before the person has any symptoms or tests positive. By letting your close contacts know they may have been exposed to COVID-19, you are helping to protect everyone. See COVID-19 and Animals if you have questions about pets. If you are diagnosed with COVID-19, someone from the health department may call you. Answer the call to slow the spread. Monitor your symptoms Symptoms of COVID-19 include fever, cough, or other symptoms. Follow care instructions from your healthcare provider and local health department. Your local health authorities may give instructions on checking your symptoms and reporting information. When to seek emergency medical attention Look for emergency warning signs* for COVID-19. If someone is showing any of these signs, seek emergency medical care immediately: Trouble breathing Persistent pain or pressure in the chest New confusion Inability to wake or stay awake Pale, gray, or blue-colored skin, lips, or nail beds, depending on skin tone *This list is not all possible symptoms. Please call your medical provider for any other symptoms that are severe or concerning to you. Call 911 or call ahead to your local emergency facility: Notify the operator that you are seeking care for someone who has or may have COVID-19. Call ahead before visiting your doctor Call ahead. Many medical visits for routine care are being postponed or done by phone or telemedicine. If you have a medical appointment that cannot be postponed, call your doctor's office, and tell them you have or may have COVID-19. This will help the office protect themselves and other patients. If you are sick, wear a well-fitting mask You should wear a mask if you must be around other people or animals,  including pets (even at home). Wear a mask with the best fit, protection, and comfort for you. You don't need to wear the mask if you are alone. If you can't put on a mask (because of trouble breathing, for example), cover your coughs and sneezes in some other way. Try to stay at least 6 feet away from other people. This will help protect the people around you. Masks should not be placed on young children under age 71 years, anyone who has trouble breathing, or anyone who is not able to remove the mask without help. Cover your  coughs and sneezes Cover your mouth and nose with a tissue when you cough or sneeze. Throw away used tissues in a lined trash can. Immediately wash your hands with soap and water for at least 20 seconds. If soap and water are not available, clean your hands with an alcohol-based hand sanitizer that contains at least 60% alcohol. Clean your hands often Wash your hands often with soap and water for at least 20 seconds. This is especially important after blowing your nose, coughing, or sneezing; going to the bathroom; and before eating or preparing food. Use hand sanitizer if soap and water are not available. Use an alcohol-based hand sanitizer with at least 60% alcohol, covering all surfaces of your hands and rubbing them together until they feel dry. Soap and water are the best option, especially if hands are visibly dirty. Avoid touching your eyes, nose, and mouth with unwashed hands. Handwashing Tips Avoid sharing personal household items Do not share dishes, drinking glasses, cups, eating utensils, towels, or bedding with other people in your home. Wash these items thoroughly after using them with soap and water or put in the dishwasher. Clean surfaces in your home regularly Clean and disinfect high-touch surfaces (for example, doorknobs, tables, handles, light switches, and countertops) in your "sick room" and bathroom. In shared spaces, you should clean and disinfect  surfaces and items after each use by the person who is ill. If you are sick and cannot clean, a caregiver or other person should only clean and disinfect the area around you (such as your bedroom and bathroom) on an as needed basis. Your caregiver/other person should wait as long as possible (at least several hours) and wear a mask before entering, cleaning, and disinfecting shared spaces that you use. Clean and disinfect areas that may have blood, stool, or body fluids on them. Use household cleaners and disinfectants. Clean visible dirty surfaces with household cleaners containing soap or detergent. Then, use a household disinfectant. Use a product from [employer] List N: Disinfectants for Coronavirus (COVID-19). Be sure to follow the instructions on the label to ensure safe and effective use of the product. Many products recommend keeping the surface wet with a disinfectant for a certain period of time (look at "contact time" on the product label). You may also need to wear personal protective equipment, such as gloves, depending on the directions on the product label. Immediately after disinfecting, wash your hands with soap and water for 20 seconds. For completed guidance on cleaning and disinfecting your home, visit Complete Disinfection Guidance. Take steps to improve ventilation at home Improve ventilation (air flow) at home to help prevent from spreading COVID-19 to other people in your household. Clear out COVID-19 virus particles in the air by opening windows, using air filters, and turning on fans in your home. Use this interactive tool to learn how to improve air flow in your home. When you can be around others after being sick with COVID-19 Deciding when you can be around others is different for different situations. Find out when you can safely end home isolation. For any additional questions about your care, contact your healthcare provider or state or local health  department. 05/02/2020 Content source: [facility] ([facility]), Division of Viral Diseases This information is not intended to replace advice given to you by your health care provider. Make sure you discuss any questions you have with your health care provider. Document Revised: 06/15/2020 Document Reviewed: 06/15/2020 Elsevier Patient Education  2022  Elsevier Inc.  If you have been instructed to have an in-person evaluation today at a local Urgent Care facility, please use the link below. It will take you to a list of all of our available [facility], including address, phone number and hours of operation. Please do not delay care.  [facility]  If you or a family member do not have a primary care provider, use the link below to schedule a visit and establish care. When you choose a [facility] primary care physician or advanced practice provider, you gain a long-term partner in health. Find a Primary Care Provider  Learn more about [facility]'s in-office and virtual care options: [facility] - Get Care Now

## 2022-10-07 NOTE — Progress Notes (Signed)
Virtual Visit Consent   Julian Washington, you are scheduled for a virtual visit with a [facility] provider today. Just as with appointments in the office, your consent must be obtained to participate. Your consent will be active for this visit and any virtual visit you may have with one of our providers in the next 365 days. If you have a MyChart account, a copy of this consent can be sent to you electronically.  As this is a virtual visit, video technology does not allow for your provider to perform a traditional examination. This may limit your provider's ability to fully assess your condition. If your provider identifies any concerns that need to be evaluated in person or the need to arrange testing (such as labs, EKG, etc.), we will make arrangements to do so. Although advances in technology are sophisticated, we cannot ensure that it will always work on either your end or our end. If the connection with a video visit is poor, the visit may have to be switched to a telephone visit. With either a video or telephone visit, we are not always able to ensure that we have a secure connection.  By engaging in this virtual visit, you consent to the provision of healthcare and authorize for your insurance to be billed (if applicable) for the services provided during this visit. Depending on your insurance coverage, you may receive a charge related to this service.  I need to obtain your verbal consent now. Are you willing to proceed with your visit today? Julian Washington has provided verbal consent on 10/07/2022 for a virtual visit (video or telephone). Julian Washington, [place]  Date: 10/07/2022 11:38 AM  Virtual Visit via Video Note   I, Julian Washington, connected with  Julian Washington  (42, Aug 19, 1980) on 10/07/22 at 11:45 AM EDT by a video-enabled telemedicine application and verified that I am speaking with the correct person using two identifiers.  Location: Patient: Virtual Visit Location  Patient: Home Provider: Virtual Visit Location Provider: Home Office   I discussed the limitations of evaluation and management by telemedicine and the availability of in person appointments. The patient expressed understanding and agreed to proceed.    History of Present Illness: Julian Washington is a 42 y.o. who identifies as a male who was assigned male at birth, and is being seen today for COVID-19. Endorses symptoms starting last Thursday night with nasal congestion and rhinorrhea. Though was related to weather changes. Friday with substantial ear pain. Giving history of AOM was evaluated and diagnosed/treated for AOM with Augmentin which he is still taking. Sunday with sore throat, change in congestion with yellow phlegm, headache. This AM retested for COVID which was positive.Denies chest pain or SOB. Denies GI symptoms.     HPI: HPI  Problems:  Patient Active Problem List   Diagnosis Date Noted   Dyslipidemia 08/21/2021   Elevated troponin I level    Paroxysmal A-fib (HCC) 12/02/2020   Abnormal finding on lung imaging 06/26/2018   ADHD 02/28/2018   Chronic venous insufficiency of lower extremity 02/28/2018   Psoriasis 02/24/2018   Splenomegaly 09/24/2017   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   Absolute anemia 12/04/2016   HTN (hypertension) 12/04/2016   Type 2 diabetes mellitus without complication, without long-term current use of insulin (HCC) 09/25/2016   Morbid obesity (HCC) 09/25/2016   PCP NOTES >>> 11/15/2014   H/O: pituitary tumor 01/28/2014   Annual physical exam 07/13/2013   Anxiety and depression  Vitamin B 12 deficiency    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 Drowsy.    Hydrocodone-Acetaminophen     REACTION: itching   Latex    Oxycodone Itching   Shiitake Mushroom Rash   Medications:  Current Outpatient Medications:    benzonatate (TESSALON) 100 MG capsule, Take 1 capsule (100 mg total) by mouth 3 (three) times daily as needed for cough., Disp: 30 capsule, Rfl: 0   molnupiravir EUA (LAGEVRIO) 200 mg CAPS capsule, Take 4 capsules (800 mg total) by mouth 2 (two) times daily for 5 days., Disp: 40 capsule, Rfl: 0   ACCU-CHEK FASTCLIX LANCETS MISC, 1 Package by Does not apply route 2 (two) times daily., Disp: 100 each, Rfl: 0   acetaminophen (TYLENOL) 500 MG tablet, Take 500-1,000 mg by mouth every 6 (six) hours as needed for moderate pain or headache., Disp: , Rfl:    amoxicillin-clavulanate (AUGMENTIN) 875-125 MG tablet, Take 1 tablet by mouth 2 (two) times daily., Disp: 20 tablet, Rfl: 0   anastrozole (ARIMIDEX) 1 MG tablet, Take 1 tablet (1 mg) by mouth once a week., Disp: 12 tablet, Rfl: 2   apixaban (ELIQUIS) 5 MG TABS tablet, Take 1 tablet (5 mg total) by mouth 2 (two) times daily., Disp: 180 tablet, Rfl: 2   atomoxetine (STRATTERA) 100 MG capsule, Take 1 capsule (100 mg total) by mouth daily., Disp: 90 capsule, Rfl: 0   augmented betamethasone dipropionate (DIPROLENE-AF) 0.05 % cream, SMARTSIG:1 sparingly Topical Twice Daily, Disp: , Rfl:    ELIQUIS 5 MG TABS tablet, TAKE 1 TABLET TWICE A DAY, Disp: 180 tablet, Rfl: 3   EPINEPHrine (EPIPEN 2-PAK) 0.3 mg/0.3 mL IJ SOAJ injection, Inject 0.3 mg into the muscle as needed. (Patient not taking: Reported on 08/02/2022), Disp: 1 each, Rfl: 1   fexofenadine (ALLEGRA) 180 MG tablet, Take 180 mg by mouth daily., Disp: , Rfl:    flecainide (TAMBOCOR) 100 MG tablet, TAKE 1 TABLET EVERY 12 HOURS, Disp: 180 tablet, Rfl: 3   fluticasone (FLONASE) 50 MCG/ACT nasal spray, Place 2 sprays into both nostrils daily., Disp: 16 g, Rfl: 6   glucose blood (ACCU-CHEK  GUIDE) test strip, 1 each by Other route 2 (two) times daily. Use as instructed, Disp: 100 each, Rfl: 0   losartan (COZAAR) 50 MG tablet, Take 1 tablet (50 mg total) by mouth daily., Disp: 90 tablet, Rfl: 3   Magnesium Oxide (MAG-OXIDE PO), Take 400 mg by mouth at bedtime., Disp: , Rfl:    metFORMIN (GLUCOPHAGE-XR) 500 MG 24 hr tablet, Take 2 tablets (1,000 mg total) by mouth daily with breakfast., Disp: 180 tablet, Rfl: 1   metoprolol succinate (TOPROL-XL) 50 MG 24 hr tablet, TAKE 1 TABLET TWICE A DAY WITH OR IMMEDIATELY FOLLOWING A MEAL, Disp: 180 tablet, Rfl: 3   omeprazole (PRILOSEC) 40 MG capsule, Take 1 capsule (40 mg total) by mouth daily before breakfast., Disp: 90 capsule, Rfl: 2   risankizumab-rzaa (SKYRIZI PEN) 150 MG/ML pen, Inject 1 pen injector subcutaneously every three months, Disp: 1 mL, Rfl: 3   rosuvastatin (CRESTOR) 10  MG tablet, Take 1 tablet (10 mg total) by mouth at bedtime., Disp: 90 tablet, Rfl: 1   Semaglutide, 2 MG/DOSE, 8 MG/3ML SOPN, Inject 2 mg as directed once a week., Disp: 3 mL, Rfl: 3   tadalafil (CIALIS) 5 MG tablet, Take 2 tablets by mouth daily. (10 mg Total) (Patient not taking: Reported on 08/02/2022), Disp: , Rfl:    Testosterone Enanthate (XYOSTED) 100 MG/0.5ML SOAJ, Inject 0.5 mLs (100 mg) into the skin once a week., Disp: 2 mL, Rfl: 5  Observations/Objective: Patient is well-developed, well-nourished in no acute distress.  Resting comfortably  at home.  Head is normocephalic, atraumatic.  No labored breathing. Speech is clear and coherent with logical content.  Patient is alert and oriented at baseline.   Assessment and Plan: 1. COVID-19 - benzonatate (TESSALON) 100 MG capsule; Take 1 capsule (100 mg total) by mouth 3 (three) times daily as needed for cough.  Dispense: 30 capsule; Refill: 0 - molnupiravir EUA (LAGEVRIO) 200 mg CAPS capsule; Take 4 capsules (800 mg total) by mouth 2 (two) times daily for 5 days.  Dispense: 40 capsule; Refill:  0  Patient with multiple risk factors for complicated course of illness. Discussed risks/benefits of antiviral medications including most common potential ADRs. Patient voiced understanding and would like to proceed with antiviral medication. They are candidate for Molnupiravir. Rx sent to pharmacy. Supportive measures, OTC medications and vitamin regimen reviewed. Tessalon per orders. Quarantine reviewed in detail. Strict ER precautions discussed with patient.    Follow Up Instructions: I discussed the assessment and treatment plan with the patient. The patient was provided an opportunity to ask questions and all were answered. The patient agreed with the plan and demonstrated an understanding of the instructions.  A copy of instructions were sent to the patient via MyChart unless otherwise noted below.   The patient was advised to call back or seek an in-person evaluation if the symptoms worsen or if the condition fails to improve as anticipated.  Time:  I spent 10 minutes with the patient via telehealth technology discussing the above problems/concerns.    Julian Climes, PA-C

## 2022-10-17 DIAGNOSIS — M9901 Segmental and somatic dysfunction of cervical region: Secondary | ICD-10-CM | POA: Diagnosis not present

## 2022-10-17 DIAGNOSIS — M62830 Muscle spasm of back: Secondary | ICD-10-CM | POA: Diagnosis not present

## 2022-10-17 DIAGNOSIS — G4486 Cervicogenic headache: Secondary | ICD-10-CM | POA: Diagnosis not present

## 2022-10-17 DIAGNOSIS — M9900 Segmental and somatic dysfunction of head region: Secondary | ICD-10-CM | POA: Diagnosis not present

## 2022-10-17 DIAGNOSIS — M9902 Segmental and somatic dysfunction of thoracic region: Secondary | ICD-10-CM | POA: Diagnosis not present

## 2022-10-18 DIAGNOSIS — M62830 Muscle spasm of back: Secondary | ICD-10-CM | POA: Diagnosis not present

## 2022-10-18 DIAGNOSIS — M9902 Segmental and somatic dysfunction of thoracic region: Secondary | ICD-10-CM | POA: Diagnosis not present

## 2022-10-18 DIAGNOSIS — M9900 Segmental and somatic dysfunction of head region: Secondary | ICD-10-CM | POA: Diagnosis not present

## 2022-10-18 DIAGNOSIS — G4486 Cervicogenic headache: Secondary | ICD-10-CM | POA: Diagnosis not present

## 2022-10-18 DIAGNOSIS — M9901 Segmental and somatic dysfunction of cervical region: Secondary | ICD-10-CM | POA: Diagnosis not present

## 2022-10-21 DIAGNOSIS — M62830 Muscle spasm of back: Secondary | ICD-10-CM | POA: Diagnosis not present

## 2022-10-21 DIAGNOSIS — M9902 Segmental and somatic dysfunction of thoracic region: Secondary | ICD-10-CM | POA: Diagnosis not present

## 2022-10-21 DIAGNOSIS — M9901 Segmental and somatic dysfunction of cervical region: Secondary | ICD-10-CM | POA: Diagnosis not present

## 2022-10-21 DIAGNOSIS — G4486 Cervicogenic headache: Secondary | ICD-10-CM | POA: Diagnosis not present

## 2022-10-21 DIAGNOSIS — M9900 Segmental and somatic dysfunction of head region: Secondary | ICD-10-CM | POA: Diagnosis not present

## 2022-10-22 ENCOUNTER — Other Ambulatory Visit: Payer: Self-pay

## 2022-10-22 ENCOUNTER — Other Ambulatory Visit (HOSPITAL_COMMUNITY): Payer: Self-pay

## 2022-10-23 DIAGNOSIS — M9901 Segmental and somatic dysfunction of cervical region: Secondary | ICD-10-CM | POA: Diagnosis not present

## 2022-10-23 DIAGNOSIS — M62830 Muscle spasm of back: Secondary | ICD-10-CM | POA: Diagnosis not present

## 2022-10-23 DIAGNOSIS — M9900 Segmental and somatic dysfunction of head region: Secondary | ICD-10-CM | POA: Diagnosis not present

## 2022-10-23 DIAGNOSIS — G4486 Cervicogenic headache: Secondary | ICD-10-CM | POA: Diagnosis not present

## 2022-10-23 DIAGNOSIS — M9902 Segmental and somatic dysfunction of thoracic region: Secondary | ICD-10-CM | POA: Diagnosis not present

## 2022-10-24 ENCOUNTER — Other Ambulatory Visit (HOSPITAL_COMMUNITY): Payer: Self-pay

## 2022-10-24 ENCOUNTER — Encounter: Payer: Self-pay | Admitting: Internal Medicine

## 2022-10-24 DIAGNOSIS — G4452 New daily persistent headache (NDPH): Secondary | ICD-10-CM

## 2022-10-25 ENCOUNTER — Other Ambulatory Visit (HOSPITAL_COMMUNITY): Payer: Self-pay

## 2022-10-25 DIAGNOSIS — G4486 Cervicogenic headache: Secondary | ICD-10-CM | POA: Diagnosis not present

## 2022-10-25 DIAGNOSIS — M9902 Segmental and somatic dysfunction of thoracic region: Secondary | ICD-10-CM | POA: Diagnosis not present

## 2022-10-25 DIAGNOSIS — M9900 Segmental and somatic dysfunction of head region: Secondary | ICD-10-CM | POA: Diagnosis not present

## 2022-10-25 DIAGNOSIS — M62830 Muscle spasm of back: Secondary | ICD-10-CM | POA: Diagnosis not present

## 2022-10-25 DIAGNOSIS — M9901 Segmental and somatic dysfunction of cervical region: Secondary | ICD-10-CM | POA: Diagnosis not present

## 2022-10-25 DIAGNOSIS — G4733 Obstructive sleep apnea (adult) (pediatric): Secondary | ICD-10-CM | POA: Diagnosis not present

## 2022-10-28 DIAGNOSIS — M62830 Muscle spasm of back: Secondary | ICD-10-CM | POA: Diagnosis not present

## 2022-10-28 DIAGNOSIS — M9901 Segmental and somatic dysfunction of cervical region: Secondary | ICD-10-CM | POA: Diagnosis not present

## 2022-10-28 DIAGNOSIS — G4486 Cervicogenic headache: Secondary | ICD-10-CM | POA: Diagnosis not present

## 2022-10-28 DIAGNOSIS — M9900 Segmental and somatic dysfunction of head region: Secondary | ICD-10-CM | POA: Diagnosis not present

## 2022-10-28 DIAGNOSIS — M9902 Segmental and somatic dysfunction of thoracic region: Secondary | ICD-10-CM | POA: Diagnosis not present

## 2022-10-29 ENCOUNTER — Other Ambulatory Visit (HOSPITAL_COMMUNITY): Payer: Self-pay

## 2022-10-30 DIAGNOSIS — M9901 Segmental and somatic dysfunction of cervical region: Secondary | ICD-10-CM | POA: Diagnosis not present

## 2022-10-30 DIAGNOSIS — G4486 Cervicogenic headache: Secondary | ICD-10-CM | POA: Diagnosis not present

## 2022-10-30 DIAGNOSIS — M9900 Segmental and somatic dysfunction of head region: Secondary | ICD-10-CM | POA: Diagnosis not present

## 2022-10-30 DIAGNOSIS — M62830 Muscle spasm of back: Secondary | ICD-10-CM | POA: Diagnosis not present

## 2022-10-30 DIAGNOSIS — M9902 Segmental and somatic dysfunction of thoracic region: Secondary | ICD-10-CM | POA: Diagnosis not present

## 2022-11-01 DIAGNOSIS — E291 Testicular hypofunction: Secondary | ICD-10-CM | POA: Diagnosis not present

## 2022-11-02 ENCOUNTER — Encounter (HOSPITAL_COMMUNITY): Payer: Self-pay

## 2022-11-06 DIAGNOSIS — M9902 Segmental and somatic dysfunction of thoracic region: Secondary | ICD-10-CM | POA: Diagnosis not present

## 2022-11-06 DIAGNOSIS — M62830 Muscle spasm of back: Secondary | ICD-10-CM | POA: Diagnosis not present

## 2022-11-06 DIAGNOSIS — M9900 Segmental and somatic dysfunction of head region: Secondary | ICD-10-CM | POA: Diagnosis not present

## 2022-11-06 DIAGNOSIS — G4486 Cervicogenic headache: Secondary | ICD-10-CM | POA: Diagnosis not present

## 2022-11-06 DIAGNOSIS — M9901 Segmental and somatic dysfunction of cervical region: Secondary | ICD-10-CM | POA: Diagnosis not present

## 2022-11-17 ENCOUNTER — Other Ambulatory Visit (HOSPITAL_COMMUNITY): Payer: Self-pay

## 2022-11-17 ENCOUNTER — Other Ambulatory Visit: Payer: Self-pay | Admitting: Internal Medicine

## 2022-11-18 ENCOUNTER — Other Ambulatory Visit (HOSPITAL_COMMUNITY): Payer: Self-pay

## 2022-11-18 MED FILL — Omeprazole Cap Delayed Release 40 MG: ORAL | 90 days supply | Qty: 90 | Fill #0 | Status: AC

## 2022-11-21 DIAGNOSIS — M62830 Muscle spasm of back: Secondary | ICD-10-CM | POA: Diagnosis not present

## 2022-11-21 DIAGNOSIS — G4486 Cervicogenic headache: Secondary | ICD-10-CM | POA: Diagnosis not present

## 2022-11-21 DIAGNOSIS — M9902 Segmental and somatic dysfunction of thoracic region: Secondary | ICD-10-CM | POA: Diagnosis not present

## 2022-11-21 DIAGNOSIS — M9900 Segmental and somatic dysfunction of head region: Secondary | ICD-10-CM | POA: Diagnosis not present

## 2022-11-21 DIAGNOSIS — M9901 Segmental and somatic dysfunction of cervical region: Secondary | ICD-10-CM | POA: Diagnosis not present

## 2022-11-22 ENCOUNTER — Other Ambulatory Visit (HOSPITAL_COMMUNITY): Payer: Self-pay

## 2022-11-24 DIAGNOSIS — G4733 Obstructive sleep apnea (adult) (pediatric): Secondary | ICD-10-CM | POA: Diagnosis not present

## 2022-11-27 ENCOUNTER — Other Ambulatory Visit: Payer: Self-pay

## 2022-11-27 ENCOUNTER — Other Ambulatory Visit (HOSPITAL_COMMUNITY): Payer: Self-pay | Admitting: Pharmacy Technician

## 2022-11-27 ENCOUNTER — Other Ambulatory Visit (HOSPITAL_COMMUNITY): Payer: Self-pay

## 2022-11-27 NOTE — Progress Notes (Signed)
Specialty Pharmacy Refill Coordination Note  Julian Washington is a 42 y.o. male contacted today regarding refills of specialty medication(s) Risankizumab-Rzaa (Antipsoriatics)   Patient requested [name] at [facility] at [place] date: 12/09/22   Medication will be filled on 12/06/22.

## 2022-11-28 DIAGNOSIS — D1801 Hemangioma of skin and subcutaneous tissue: Secondary | ICD-10-CM | POA: Diagnosis not present

## 2022-11-28 DIAGNOSIS — I8312 Varicose veins of left lower extremity with inflammation: Secondary | ICD-10-CM | POA: Diagnosis not present

## 2022-11-28 DIAGNOSIS — D225 Melanocytic nevi of trunk: Secondary | ICD-10-CM | POA: Diagnosis not present

## 2022-11-28 DIAGNOSIS — Z79899 Other long term (current) drug therapy: Secondary | ICD-10-CM | POA: Diagnosis not present

## 2022-11-28 DIAGNOSIS — L400 Psoriasis vulgaris: Secondary | ICD-10-CM | POA: Diagnosis not present

## 2022-11-28 DIAGNOSIS — I8311 Varicose veins of right lower extremity with inflammation: Secondary | ICD-10-CM | POA: Diagnosis not present

## 2022-11-28 DIAGNOSIS — L820 Inflamed seborrheic keratosis: Secondary | ICD-10-CM | POA: Diagnosis not present

## 2022-11-28 DIAGNOSIS — I872 Venous insufficiency (chronic) (peripheral): Secondary | ICD-10-CM | POA: Diagnosis not present

## 2022-11-28 DIAGNOSIS — L570 Actinic keratosis: Secondary | ICD-10-CM | POA: Diagnosis not present

## 2022-12-08 MED FILL — Omeprazole Cap Delayed Release 40 MG: ORAL | 90 days supply | Qty: 90 | Fill #1 | Status: CN

## 2022-12-08 MED FILL — Losartan Potassium Tab 50 MG: ORAL | 90 days supply | Qty: 90 | Fill #1 | Status: AC

## 2022-12-09 ENCOUNTER — Other Ambulatory Visit (HOSPITAL_COMMUNITY): Payer: Self-pay

## 2022-12-09 ENCOUNTER — Encounter: Payer: Self-pay | Admitting: Internal Medicine

## 2022-12-09 ENCOUNTER — Ambulatory Visit: Payer: Commercial Managed Care - PPO | Admitting: Internal Medicine

## 2022-12-09 ENCOUNTER — Other Ambulatory Visit: Payer: Self-pay

## 2022-12-09 VITALS — BP 138/80 | HR 70 | Temp 97.6°F | Resp 20 | Ht 71.0 in | Wt 387.0 lb

## 2022-12-09 DIAGNOSIS — Z7985 Long-term (current) use of injectable non-insulin antidiabetic drugs: Secondary | ICD-10-CM

## 2022-12-09 DIAGNOSIS — I480 Paroxysmal atrial fibrillation: Secondary | ICD-10-CM | POA: Diagnosis not present

## 2022-12-09 DIAGNOSIS — G43809 Other migraine, not intractable, without status migrainosus: Secondary | ICD-10-CM

## 2022-12-09 DIAGNOSIS — R519 Headache, unspecified: Secondary | ICD-10-CM | POA: Diagnosis not present

## 2022-12-09 DIAGNOSIS — E119 Type 2 diabetes mellitus without complications: Secondary | ICD-10-CM | POA: Diagnosis not present

## 2022-12-09 LAB — BASIC METABOLIC PANEL
BUN: 12 mg/dL (ref 6–23)
CO2: 26 meq/L (ref 19–32)
Calcium: 8.6 mg/dL (ref 8.4–10.5)
Chloride: 108 meq/L (ref 96–112)
Creatinine, Ser: 0.74 mg/dL (ref 0.40–1.50)
GFR: 112.4 mL/min (ref >60.00)
Glucose, Bld: 118 mg/dL — ABNORMAL HIGH (ref 70–99)
Potassium: 4.2 meq/L (ref 3.5–5.1)
Sodium: 140 meq/L (ref 135–145)

## 2022-12-09 LAB — HEMOGLOBIN A1C: Hgb A1c MFr Bld: 6.1 % (ref 4.6–6.5)

## 2022-12-09 NOTE — Assessment & Plan Note (Signed)
Headache: As described above, has a history of migraines, had similar episode 10/2021, CT head at that time was negative. He is holding testosterone and Ozempic and since then headaches are are better. Start Topamax?  Patient did not like how he felt we did.  Calcium channel blockers?  They cause edema.  Imitrex?  Patient declined a trial. Plan: Reintroduce Ozempic and see if it is truly causing the headache See neurology as scheduled Seek medical attention if severe headaches. DM: On metformin and Ozempic , see above, check A1c A-fib: BP and heart rate very good, continue present care, check BMP Preventive care: Recommend COVID-vaccine RTC 4 months.

## 2022-12-09 NOTE — Patient Instructions (Addendum)
Seek medical attention if you have severe headaches. If your headaches are mild but persistent let me know  Vaccines I recommend: Covid booster   Check the  blood pressure regularly Blood pressure goal:  between 110/65 and  135/85. If it is consistently higher or lower, let me know     GO TO THE LAB : Get the blood work     Next visit with me   in 4 months for a checkup     Please schedule it at the front desk

## 2022-12-09 NOTE — Progress Notes (Signed)
Subjective:    Patient ID: Julian Washington, male    DOB: 07/12/1980, 42 y.o.   MRN: [id number]  DOS:  12/09/2022 Type of visit - description: Routine checkup  Chronic medical problems addressed. Also approximately 3 months ago the headache resurfaced. Typically located on the  posterior neck radiating up to the head. Slightly worse with head motion? + Mild light intolerance, no noise intolerance. No fall or injury. No nausea or vomiting. For the last 3 weeks he is holding testosterone and Ozempic and he did improve (Did get a single Ozempic injection and had a mild headache).  Side effects?  Review of Systems See above   Past Medical History:  Diagnosis Date   ADHD    Anemia    Anxiety and depression    h/o suicidality w/ citalopram   Atrial fibrillation (HCC)    Diabetes mellitus without complication (HCC)    Difficult intubation    ETD (eustachian tube dysfunction)    s/p ENT, declined ear tuves before    GERD (gastroesophageal reflux disease)    History of chicken pox    Titered on 08/16/2005   Hypogonadism male    Dr Elvera Lennox, used to see urology   Infertility male    Internal hemorrhoids    s/p banding   Low testosterone    Migraine    on topamax   OSA on CPAP    Palpitations    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HCC)    h/o increased prolactin, Dr Elvera Lennox (previously @ [facility])   Prediabetes    A1C 6.2 years ago   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   Vitamin B 12 deficiency    h/o   Vitamin D deficiency     Past Surgical History:  Procedure Laterality Date   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-04-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   atomoxetine (STRATTERA) 100  mg, Oral, Daily   augmented betamethasone dipropionate (DIPROLENE-AF) 0.05 % cream SMARTSIG:1 sparingly Topical Twice Daily   Eliquis 5 mg, Oral, 2 times daily   Eliquis 5 mg, Oral, 2 times daily   EPINEPHrine (EPIPEN 2-PAK) 0.3 mg, Intramuscular, As needed   fexofenadine (ALLEGRA) 180 mg, Oral, Daily   flecainide (TAMBOCOR) 100 mg, Oral, Every 12 hours   fluticasone (FLONASE) 50 MCG/ACT nasal spray 2 sprays, Each Nare, Daily   glucose blood (ACCU-CHEK GUIDE) test strip 1 each, Other, 2 times daily, Use as instructed    losartan (COZAAR) 50 mg, Oral, Daily   Magnesium Oxide (MAG-OXIDE PO) 400 mg, Oral, Daily at bedtime   metFORMIN (GLUCOPHAGE-XR) 1,000 mg, Oral, Daily with breakfast   metoprolol succinate (TOPROL-XL) 50 MG 24 hr tablet TAKE 1 TABLET TWICE A DAY WITH OR IMMEDIATELY FOLLOWING A MEAL   omeprazole (PRILOSEC) 40 mg, Oral, Daily before breakfast   Ozempic (2 MG/DOSE) 2 mg, Injection, Weekly   risankizumab-rzaa (SKYRIZI PEN) 150 MG/ML pen Inject 1 pen injector subcutaneously every three months   rosuvastatin (CRESTOR) 10 mg, Oral, Daily at bedtime   tadalafil (CIALIS) 5 MG tablet 2 tablets, Daily   Testosterone Enanthate (XYOSTED) 100 MG/0.5ML SOAJ Inject 0.5 mLs (100 mg) into the  skin once a week.       Objective:   Physical Exam BP 138/80   Pulse 70   Temp 97.6 F (36.4 C) (Oral)   Resp 20   Ht 5\' 11"  (1.803 m)   Wt (!) 387 lb (175.5 kg)   SpO2 98%   BMI 53.98 kg/m  General:   Well developed, NAD, BMI noted. HEENT:  Normocephalic . Face symmetric, atraumatic Neck: Range of motion is normal. Lungs:  CTA B Normal respiratory effort, no intercostal retractions, no accessory muscle use. Heart: RRR,  no murmur.  Lower extremities: no pretibial edema bilaterally  Skin: Not pale. Not jaundice Neurologic:  alert & oriented X3.  Speech normal, gait appropriate for age and unassisted. Motor symmetric. Psych--  Cognition and judgment appear intact.  Cooperative  with normal attention span and concentration.  Behavior appropriate. No anxious or depressed appearing.      Assessment     Assessment   DM Chronic venous insufficiency/lymphedema/stasis dermatitis PSYCH: --Anxiety depression (suicidality w/  Citalopram) --ADHD Dx 2019, Dr Elisabeth Most Morbid obesity Vitamin D and  B12 deficiency CV: New onset A. fib 11-2020.  Admitted  Calcium coronary score 0 (11-2020) Endocrinology:Dr Gherghe --Pituitary mass, surgery : Prolactinoma --Hypogonadism hypogonadotropic --Infertility Migraines, d/c Topamax 05-2015 (pt concerned about short term memory)  OSA on CPAP -- DR Craige Cotta Hematology -Mild chronic anemia, no previous colonoscopy or EGD. Iron , vitamins normal.  saw hematology 09/2017  -Splenomegaly: Per CT 09/20/2017, saw hematology --Abdominal lymphadenopathies: Felt to be stable, see hematology note from July 14, 2019. Pulmonary nodules: Felt to be stable per last CT. DERM: Psoriasis  H/o ET  dysfunction, declined ear tubes H/o hemorrhoids s/p  banding H/o abd pain 2012 (w/u CT, [place], a HIDA scan), similar sx 02-2015 H/o  difficulty intubation    PLAN: Headache: As described above, has a history of migraines, had similar episode 10/2021, CT head at that time was negative. He is holding testosterone and Ozempic and since then headaches are are better. Start Topamax?  Patient did not like how he felt we did.  Calcium channel blockers?  They cause edema.  Imitrex?  Patient declined a trial. Plan: Reintroduce Ozempic and see if it is truly causing the headache See neurology as scheduled Seek medical attention if severe headaches. DM: On metformin and Ozempic , see above, check A1c A-fib: BP and heart rate very good, continue present care, check BMP Preventive care: Recommend COVID-vaccine RTC 4 months.

## 2022-12-10 ENCOUNTER — Other Ambulatory Visit (HOSPITAL_COMMUNITY): Payer: Self-pay

## 2022-12-15 DIAGNOSIS — G4733 Obstructive sleep apnea (adult) (pediatric): Secondary | ICD-10-CM | POA: Diagnosis not present

## 2022-12-25 DIAGNOSIS — G4733 Obstructive sleep apnea (adult) (pediatric): Secondary | ICD-10-CM | POA: Diagnosis not present

## 2022-12-31 ENCOUNTER — Other Ambulatory Visit: Payer: Self-pay | Admitting: Cardiology

## 2022-12-31 ENCOUNTER — Other Ambulatory Visit (HOSPITAL_COMMUNITY): Payer: Self-pay

## 2023-01-01 ENCOUNTER — Other Ambulatory Visit (HOSPITAL_COMMUNITY): Payer: Self-pay

## 2023-01-01 MED FILL — Flecainide Acetate Tab 100 MG: ORAL | 90 days supply | Qty: 180 | Fill #0 | Status: AC

## 2023-01-01 MED FILL — Metoprolol Succinate Tab ER 24HR 50 MG (Tartrate Equiv): ORAL | 90 days supply | Qty: 180 | Fill #0 | Status: AC

## 2023-01-02 ENCOUNTER — Other Ambulatory Visit (HOSPITAL_COMMUNITY): Payer: Self-pay

## 2023-01-02 ENCOUNTER — Other Ambulatory Visit: Payer: Self-pay

## 2023-01-06 ENCOUNTER — Other Ambulatory Visit (HOSPITAL_COMMUNITY): Payer: Self-pay

## 2023-01-07 ENCOUNTER — Other Ambulatory Visit (HOSPITAL_BASED_OUTPATIENT_CLINIC_OR_DEPARTMENT_OTHER): Payer: Self-pay | Admitting: Family

## 2023-01-07 ENCOUNTER — Other Ambulatory Visit (HOSPITAL_COMMUNITY): Payer: Self-pay

## 2023-01-07 DIAGNOSIS — I4891 Unspecified atrial fibrillation: Secondary | ICD-10-CM

## 2023-01-07 MED FILL — Apixaban Tab 5 MG: ORAL | 90 days supply | Qty: 180 | Fill #0 | Status: AC

## 2023-01-07 NOTE — Telephone Encounter (Signed)
Eliquis refill.

## 2023-01-07 NOTE — Telephone Encounter (Signed)
Prescription refill request for Eliquis received. Indication: Afib  Last office visit: 6/17/254 Veleta Miners)  Scr: 0.74 (12/09/22)  Age: 42 Weight: 175.5kg  Appropriate dose. Refill sent.

## 2023-01-08 ENCOUNTER — Other Ambulatory Visit (HOSPITAL_COMMUNITY): Payer: Self-pay

## 2023-01-14 ENCOUNTER — Other Ambulatory Visit: Payer: Self-pay

## 2023-01-14 ENCOUNTER — Other Ambulatory Visit (HOSPITAL_COMMUNITY): Payer: Self-pay

## 2023-01-14 ENCOUNTER — Other Ambulatory Visit: Payer: Self-pay | Admitting: Internal Medicine

## 2023-01-14 MED ORDER — OZEMPIC (2 MG/DOSE) 8 MG/3ML ~~LOC~~ SOPN
2.0000 mg | PEN_INJECTOR | SUBCUTANEOUS | 3 refills | Status: DC
  Filled 2023-01-14: qty 3, 28d supply, fill #0
  Filled 2023-02-13: qty 3, 28d supply, fill #1
  Filled 2023-03-30: qty 3, 28d supply, fill #2

## 2023-01-15 ENCOUNTER — Other Ambulatory Visit (HOSPITAL_COMMUNITY): Payer: Self-pay

## 2023-01-15 ENCOUNTER — Ambulatory Visit: Payer: Commercial Managed Care - PPO | Attending: Cardiology | Admitting: Cardiology

## 2023-01-15 ENCOUNTER — Encounter: Payer: Self-pay | Admitting: Cardiology

## 2023-01-15 VITALS — BP 146/96 | HR 73 | Ht 71.0 in | Wt 376.0 lb

## 2023-01-15 DIAGNOSIS — Z79899 Other long term (current) drug therapy: Secondary | ICD-10-CM

## 2023-01-15 DIAGNOSIS — I10 Essential (primary) hypertension: Secondary | ICD-10-CM

## 2023-01-15 DIAGNOSIS — I480 Paroxysmal atrial fibrillation: Secondary | ICD-10-CM

## 2023-01-15 MED ORDER — LOSARTAN POTASSIUM 100 MG PO TABS
100.0000 mg | ORAL_TABLET | Freq: Every day | ORAL | 3 refills | Status: DC
  Filled 2023-01-15: qty 90, 90d supply, fill #0
  Filled 2023-04-21: qty 90, 90d supply, fill #1
  Filled 2023-07-18: qty 90, 90d supply, fill #2
  Filled 2023-10-14: qty 90, 90d supply, fill #3

## 2023-01-15 NOTE — Progress Notes (Signed)
  Electrophysiology Office Follow up Visit Note:    Date:  01/15/2023   ID:  Julian Washington, DOB [birth year], MRN [id number]  PCP:  Wanda Plump, MD  [facility] Cardiologist:  Lanier Prude, MD  [facility] Electrophysiologist:  None    Interval History:     Julian Washington is a 42 y.o. male who presents for a follow up visit.  He was last seen by Ivory Broad July 29, 2022.  He has a history of atrial fibrillation, sleep apnea on CPAP, diabetes, hypertension and obesity.  He has been maintained on flecainide.  He is on Eliquis for stroke prophylaxis.   Discussed the use of AI scribe software for clinical note transcription with the patient, who gave verbal consent to proceed.  History of Present Illness   The patient, with a history of atrial fibrillation managed with flecainide, Eliquis, and losartan, presents with migraines and high blood pressure. The migraines, described as posterior and radiating around, started after initiating testosterone therapy. The patient's blood pressure was also found to be elevated. He has since stopped the testosterone, and the migraines have essentially resolved.  The patient is also considering bariatric surgery and is in the initial stages of the process.He is also aware of the need to manage his blood pressure and Eliquis around the time of the surgery.            Past medical, surgical, social and family history were reviewed.  ROS:   Please see the history of present illness.    All other systems reviewed and are negative.  EKGs/Labs/Other Studies Reviewed:    The following studies were reviewed today:     EKG Interpretation Date/Time:  Wednesday January 15 2023 16:14:42 EST Ventricular Rate:  73 PR Interval:  172 QRS Duration:  102 QT Interval:  406 QTC Calculation: 447 R Axis:   10  Text Interpretation: Normal sinus rhythm Confirmed by Steffanie Dunn [phone]) on 01/15/2023 4:27:43 PM    Physical Exam:    VS:  BP  (!) 146/96   Pulse 73   Ht 5\' 11"  (1.803 m)   Wt (!) 376 lb (170.6 kg)   SpO2 97%   BMI 52.44 kg/m     Wt Readings from Last 3 Encounters:  01/15/23 (!) 376 lb (170.6 kg)  12/09/22 (!) 387 lb (175.5 kg)  08/02/22 (!) 375 lb 4 oz (170.2 kg)     GEN: no distress CARD: RRR, No MRG RESP: No IWOB. CTAB.      ASSESSMENT:    1. Paroxysmal atrial fibrillation (HCC)   2. Primary hypertension   3. Encounter for long-term (current) use of high-risk medication    PLAN:    In order of problems listed above:   #Paroxysmal atrial fibrillation #High risk drug monitoring-flecainide Doing well on flecainide.  QRS and PR are stable for continued use Continue Eliquis for stroke prophylaxis  #Hypertension Above goal today.  Recommend checking blood pressures 1-2 times per week at home and recording the values.  Recommend bringing these recordings to the primary care physician. Continue losartan, metoprolol Increase losartan to 100mg  PO daily. He will need to keep a close eye on his BP as he increases the dose to confirm no low Bps are encountered.  Follow-up 6 months with APP.      Signed, Steffanie Dunn, MD, [facility], [facility] 01/15/2023 8:17 PM    Electrophysiology [facility]

## 2023-01-15 NOTE — Patient Instructions (Signed)
Medication Instructions:  Your physician has recommended you make the following change in your medication:  1) INCREASE losartan to 100 mg daily  *If you need a refill on your cardiac medications before your next appointment, please call your pharmacy*  Follow-Up: At [facility], you and your health needs are our priority.  As part of our continuing mission to provide you with exceptional heart care, we have created designated Provider Care Teams.  These Care Teams include your primary Cardiologist (physician) and Advanced Practice Providers (APPs -  Physician Assistants and Nurse Practitioners) who all work together to provide you with the care you need, when you need it.  Your next appointment:   6 months  Provider:   You will see one of the following Advanced Practice Providers on your designated Care Team:   Francis Dowse, Charlott Holler [address] [place], [place] Sherie Don, NP Canary Brim, NP

## 2023-01-17 ENCOUNTER — Encounter: Payer: Self-pay | Admitting: Internal Medicine

## 2023-01-21 ENCOUNTER — Other Ambulatory Visit (HOSPITAL_COMMUNITY): Payer: Self-pay

## 2023-01-24 DIAGNOSIS — G4733 Obstructive sleep apnea (adult) (pediatric): Secondary | ICD-10-CM | POA: Diagnosis not present

## 2023-01-24 DIAGNOSIS — N529 Male erectile dysfunction, unspecified: Secondary | ICD-10-CM | POA: Diagnosis not present

## 2023-01-24 DIAGNOSIS — E230 Hypopituitarism: Secondary | ICD-10-CM | POA: Diagnosis not present

## 2023-01-24 DIAGNOSIS — E280 Estrogen excess: Secondary | ICD-10-CM | POA: Diagnosis not present

## 2023-01-27 ENCOUNTER — Encounter: Payer: Self-pay | Admitting: Internal Medicine

## 2023-01-27 ENCOUNTER — Ambulatory Visit: Payer: Commercial Managed Care - PPO | Admitting: Internal Medicine

## 2023-01-27 VITALS — BP 132/84 | HR 79 | Ht 71.0 in | Wt 381.2 lb

## 2023-01-27 DIAGNOSIS — E230 Hypopituitarism: Secondary | ICD-10-CM

## 2023-01-27 DIAGNOSIS — Z87898 Personal history of other specified conditions: Secondary | ICD-10-CM

## 2023-01-27 NOTE — Progress Notes (Addendum)
Patient ID: Julian Washington, male   DOB: [birth year], 42 y.o.   MRN: [id number]  HPI: Julian ROUP is a 42 y.o.-year-old man, presenting for f/u for hypogonadotropic hypogonadism, history of pituitary adenoma - s/p TSR, and DM2, non-insulin-dependent.  Last visit a year ago.  His wife, Julian Washington, is also my patient.  Interim history: No increased urination, blurry vision, nausea. At today's visit, he does not report visual disturbances.  He had headaches in 10/2021, for which he had a CT scan -no acute abnormalities. He had COVID in 09/2022.  He recovered well. He saw urology (Dr. Lafonda Mosses - also fertility management) >> started [facility] weekly and Arimidex - but stopped 2/2 HAs. HAs stopped since. He will see neurology tomorrow. He plans to start po testosterone per urology.   History of pituitary adenoma: Reviewed and addended history: The patient has been diagnosed with hypogonadotropic hypogonadism ~2012, after trying to achieve a pregnancy. He tried testosterone replacement and clomiphene (which did not work), a cousin who is a medical resident suggested to have further investigation to find out the cause for his hypogonadism.   His urologist at that time ordered a pituitary MRI and he has been found to have a pituitary microadenoma on MRI in 12/2011. He was referred to neurosurgery - Dr Lilli Light - and he ended up having TSR in 02/2012. Surgical pathology showed:  The sections show monomorphic nests of neoplastic cells with neuroendocrine nuclear features, consistent with a pituitary adenoma. The tumor is strongly and diffusely immunoreactive for prolactin (100% of cells). There is patchy staining for adrenocorticotropic hormone (less than 5% of cells). The cells are negative for TSH, HGH, FSH, and beta-LH.   His highest prolactin level was 44.7 before the surgery. This has decreased after the surgery.  I reviewed patient's pertinent labs per records from  Dr. Elbert Ewings at [facility]  and also in Care Everywhere: 11/2010: TSH 2 02/2011: Testosterone 0.89, free testosterone 2.2 (9.3-26.5) 11/17/2011: TSH 2.35, FSH 2.08, LH 1.44, testosterone 0.08 (2.41-8.27) 12/17/2011: Prolactin 44.7, estradiol 23, cortisol 2.9, hemoglobin A1c 6.2% 02/19/2012: TSH 1.9, total T3 106, free T4 1, hemoglobin A1c 5.8% 03/05/2012: Prolactin 1.4, cortisol 37.2, hemoglobin A1c 5.6%  04/02/2012: Prolactin 3.2, testosterone 79 ([phone]), hemoglobin A1c 6.1%  06/19/2012: Total testosterone 119 ([phone]), free testosterone 23.5 (40-224), SHBG 17 (10-50), LH  3.4, FSH 4.7, ACTH 20, cortisol 6.5, TSH 3.072, free T4 0.9 09/23/2012: Total testosterone 115, free testosterone 20.5, SHBG 20 04/02/2013: Total testosterone 76, free testosterone 15.6, SHBG 15  04/26/2013: TSH 2.34, free T4 0.9  Pituitary MRI 12/28/2011: Heterogeneous signal intensity lesion centered in the anterior aspect of the sella measures approximately 1 cm in maximum dimension and demonstrates a fluid hematocrit level with some intrinsic T1 bright/T2 dark signal along its caudal margin consistent with subacute hemorrhage. There is no mass effect on the optic chiasm or evidence of cavernous sinus invasion.  Pituitary MRI 09/2012: Right lateral sellar soft tissue, likely residual pituitary 0.6 x 0.4 x 0.9 cm, homogeneous postcontrast enhancement, abuts medial aspect of the right cavernous ICA. No mass effect.  Pituitary MRI (10/05/2014): No residual tumor.  Pituitary MRI (09/25/2017): Postoperative changes from partial pituitary resection. No recurrent tumor seen.  No change compared with 09/28/2014  Head CT (10/29/2021): Performed due to headaches in the setting of chronic blood thinner use: no acute abnormalities; pituitary gland was not characterized.   Needs MRI [facility].  He has claustrophobia so the MRI machine has to be completely open. Marland Kitchen  Hypogonadotropic hypogonadism: He has retrograde ejaculation.  He is currently seeing  urology.  Reviewed history: Diagnosed when he and his wife were trying to get pregnant. They are preparing for IVF at [facility] >> wife's diabetes is not under control and he needs to be between 6 and 7% for the procedure. They are seeing reproductive endocrinology: Dr Moreen Fowler, MD.  He started to have low T sxs 2011-2012.  He was previously on Testosterone: axillar gel (did not like it as he was always fearing that he can transfer this to his wife) and inj (200 mg 2x a month). On this >> T level normalized, however he cannot do the injections himself. Clomid did not help (3 mo) >> lost capacity to ejaculate and got hot flushes when stopped.    He was started on [facility] and Arimidex by urology >> HAs.   He admits for decreased libido No difficulty obtaining but has problems maintaining an erection No trauma to testes, testicular irradiation or surgery No h/o of mumps orchitis/h/o autoimmune ds. No h/o cryptorchidism He grew and went through puberty like his peers No shrinking of testes. + small testes (<5 ml) No incomplete/delayed sexual development     No breast discomfort/gynecomastia    No loss of body hair (axillary/pubic)/decreased need for shaving No height loss No abnormal sense of smell (only allergies) No hot flushes No vision problems No worst HA of his life, but had headaches No FH of hypogonadism/infertility  No personal h/o infertility -but under investigation No FH of hemochromatosis or pituitary tumors No excessive weight gain or loss.  No chronic diseases No chronic pain. Not on opiates, does not take steroids.  No more than 2 drinks a day of alcohol at a time, and this is rarely No anabolic steroids use No herbal medicines Not on antidepressants  No AI ds in his family, no FH of MS.  He does not have family history of early cardiac disease.  Grandfather had a MI at 81 y/o.  Reviewed his pituitary labs from our previous visits: Low  testosterone/LH/FSH, and also low IGF-I: Component     Latest Ref Rng 01/24/2022  Triiodothyronine,Free,Serum     2.3 - 4.2 pg/mL 3.5   T4,Free(Direct)     0.60 - 1.60 ng/dL 6.64   TSH     4.03 - 4.74 uIU/mL 2.36   Cortisol, Plasma     ug/dL 8.1   IGF-I, LC/MS     52 - 328 ng/mL 31 (L)   Z-Score (Male)     -2.0 - 2.0 SD -2.8 (L)   Prolactin     2.0 - 18.0 ng/mL 2.1   C206 ACTH     6 - 50 pg/mL 29   Hemoglobin A1C     4.6 - 6.5 % 5.9   Growth Hormone     < OR = 7.1 ng/mL <0.1    Component     Latest Ref Rng & Units 04/30/2018  Testosterone, Serum (Total)     ng/dL 33 (L)  % Free Testosterone     % 2.2  Free Testosterone, S     pg/mL 7.3 (L)  Sex Hormone Binding Globulin     nmol/L 21.4  IGF-I, LC/MS     53 - 331 ng/mL 49 (L)  Z-Score (Male)     -2.0 - 2 SD -2.1 (L)  FSH     1.4 - 18.1 mIU/ML 4.2  LH     1.50 - 9.30 mIU/mL 2.43  Cortisol, Plasma     ug/dL 7.5  M578 ACTH     6 - 50 pg/mL 20  Prolactin     2.0 - 18.0 ng/mL 5.7  TSH     0.35 - 4.50 uIU/mL 2.71  T4,Free(Direct)     0.60 - 1.60 ng/dL 4.69  Triiodothyronine,Free,Serum     2.3 - 4.2 pg/mL 4.0   Component     Latest Ref Rng & Units 02/10/2017  Testosterone, Serum (Total)     264-916 ng/dL 46 (L)  % Free Testosterone     % 1.4  Free Testosterone, S     52-280 pg/mL  6.4 (L)  Sex Hormone Binding Globulin     nmol/L 25.9  IGF-I, LC/MS     53 - 331 ng/mL 64  Z-Score (Male)     -2.0 - 2 SD -1.6  FSH     1.4 - 18.1 mIU/ML 3.4  LH     1.50 - 9.30 mIU/mL 2.65  Cortisol, Plasma     ug/dL 5.0  G295 ACTH     6 - 50 pg/mL 28  Prolactin     2.0 - 18.0 ng/mL 3.3  TSH     0.35 - 4.50 uIU/mL 2.71  T4,Free(Direct)     0.60 - 1.60 ng/dL 2.84  Triiodothyronine,Free,Serum     2.3 - 4.2 pg/mL 3.1   Component     Latest Ref Rng & Units 01/10/2016  Testosterone     264 - 916 ng/dL 29 (L)  Testosterone Free     8.7 - 25.1 pg/mL 1.6 (L)  Sex Horm Binding Glob, Serum     16.5 - 55.9 nmol/L 22.8   IGF-I, LC/MS     53 - 331 ng/mL 61  Z-Score (Male)     -2.0 - 2.0 SD -1.7  FSH     1.4 - 18.1 mIU/ML 3.7  LH     1.50 - 9.30 mIU/mL 2.76  T4,Free(Direct)     0.60 - 1.60 ng/dL 1.32  Triiodothyronine,Free,Serum     2.3 - 4.2 pg/mL 3.6  Cortisol, Plasma     ug/dL 4.8  G401 ACTH     6 - 50 pg/mL 18  TSH     0.35 - 4.50 uIU/mL 1.27  Prolactin     2.0 - 18.0 ng/mL 4.2   In preparation for pregnancy, is starting on hCG injections tomorrow.  DM2: -Managed by PCP. He previously had prediabetes but was diagnosed with diabetes in 10/2018: Lab Results  Component Value Date   HGBA1C 6.1 12/09/2022   HGBA1C 5.5 08/02/2022   HGBA1C 5.9 01/24/2022   HGBA1C 6.2 05/21/2021   HGBA1C 6.2 (H) 12/02/2020   HGBA1C 6.8 (H) 07/28/2020   HGBA1C 6.5 01/26/2020   HGBA1C 6.5 11/04/2018   HGBA1C 6.9 (H) 02/27/2018   HGBA1C 5.5 11/20/2016   HGBA1C 6.2 (H) 07/11/2016   HGBA1C 5.7 01/10/2016   HGBA1C 5.8 07/06/2015   HGBA1C 6.2 01/25/2015   HGBA1C 5.9 07/08/2014   No CKD:   Chemistry      Component Value Date/Time   NA 140 12/09/2022 0849   NA 141 11/20/2016 0811   K 4.2 12/09/2022 0849   CL 108 12/09/2022 0849   CO2 26 12/09/2022 0849   BUN 12 12/09/2022 0849   BUN 18 11/20/2016 0811   CREATININE 0.74 12/09/2022 0849   CREATININE 0.75 07/09/2019 0942   CREATININE 0.73 01/19/2016 1624      Component Value Date/Time   CALCIUM 8.6 12/09/2022  0849   ALKPHOS 55 08/02/2022 0919   AST 13 08/02/2022 0919   AST 16 07/09/2019 0942   ALT 14 08/02/2022 0919   ALT 25 07/09/2019 0942   BILITOT 0.6 08/02/2022 0919   BILITOT 0.5 07/09/2019 0942     + History of dyslipidemia: Lab Results  Component Value Date   CHOL 77 08/02/2022   HDL 31.10 (L) 08/02/2022   LDLCALC 31 08/02/2022   TRIG 74.0 08/02/2022   CHOLHDL 2 08/02/2022  On Crestor 10 mg daily.  Last eye exam: 03/11/2022: No DR.  He sees podiatry.  Last foot exam 06/05/2022- Dr. Ralene Cork.  He is on: - Ozempic 1 >> 2 mg  weekly He was previously on Victoza, and Saxenda.   Lab Results  Component Value Date   HGBA1C 6.1 12/09/2022   HGBA1C 5.5 08/02/2022   HGBA1C 5.9 01/24/2022   HGBA1C 6.2 05/21/2021   HGBA1C 6.2 (H) 12/02/2020   Obesity: He tried weight watchers in the past. He was also seen in the weight management clinic in the past. Strattera helps controlling his appetite.  Previously on Concerta. On GLP-1 receptor agonist, as mentioned above  He also has history of B12 deficiency and iron deficiency anemia. He was diagnosed with OSA in 2011.  He uses a CPAP machine. Previously working nights, but now works days. He has PTSD from when he was intubated for his TSR surgery in 2014.  ROS: + See HPI  I reviewed pt's medications, allergies, PMH, social hx, family hx, and changes were documented in the history of present illness. Otherwise, unchanged from my initial visit note.  Past Medical History:  Diagnosis Date   ADHD    Anemia    Anxiety and depression    h/o suicidality w/ citalopram   Atrial fibrillation (HCC)    Diabetes mellitus without complication (HCC)    Difficult intubation    ETD (eustachian tube dysfunction)    s/p ENT, declined ear tuves before    GERD (gastroesophageal reflux disease)    History of chicken pox    Titered on 08/16/2005   Hypogonadism male    Dr Elvera Lennox, used to see urology   Infertility male    Internal hemorrhoids    s/p banding   Low testosterone    Migraine    on topamax   OSA on CPAP    Palpitations    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HCC)    h/o increased prolactin, Dr Elvera Lennox (previously @ [facility])   Prediabetes    A1C 6.2 years ago   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   Vitamin B 12 deficiency    h/o   Vitamin D deficiency    Past Surgical History:  Procedure Laterality Date   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-04-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, ACTH   TONSILLECTOMY AND ADENOIDECTOMY     Social History   Socioeconomic History   Marital status:  Married    Spouse name: Not on file   Number of children: 0   Years of education: Not on file   Highest education level: Doctorate  Occupational History   Occupation: Charity fundraiser, 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    Employer: [employer]   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  Tobacco Use   Smoking status: Never   Smokeless tobacco: Never  Vaping Use   Vaping status: Never Used  Substance and Sexual Activity   Alcohol use: Not Currently    Comment: none x 10 yrs   Drug  use: No   Sexual activity: Not on file  Other Topics Concern   Not on file  Social History Narrative   Lives w/ wife   1 dog    Social Drivers of Health   Financial Resource Strain: Low Risk  (12/08/2022)   Overall Financial Resource Strain (CARDIA)    Difficulty of Paying Living Expenses: Not hard at all  Food Insecurity: No Food Insecurity (12/08/2022)   Hunger Vital Sign    Worried About Running Out of Food in the Last Year: Never true    Ran Out of Food in the Last Year: Never true  Transportation Needs: No Transportation Needs (12/08/2022)   PRAPARE - Administrator, Civil Service (Medical): No    Lack of Transportation (Non-Medical): No  Physical Activity: Unknown (12/08/2022)   Exercise Vital Sign    Days of Exercise per Week: 0 days    Minutes of Exercise per Session: Not on file  Stress: No Stress Concern Present (12/08/2022)   [employer] of Occupational Health - Occupational Stress Questionnaire    Feeling of Stress : Only a little  Social Connections: Socially Integrated (12/08/2022)   Social Connection and Isolation Panel [NHANES]    Frequency of Communication with Friends and Family: More than three times a week    Frequency of Social Gatherings with Friends and Family: Once a week    Attends Religious Services: More than 4 times per year    Active Member of [employer] or Organizations: Yes    Attends Engineer, structural: More than 4 times per year    Marital Status:  Married  Catering manager Violence: Unknown (01/16/2022)   Received from [employer], [facility]   HITS    Physically Hurt: Not on file    Insult or Talk Down To: Not on file    Threaten Physical Harm: Not on file    Scream or Curse: Not on file   Current Outpatient Medications on File Prior to Visit  Medication Sig Dispense Refill   ACCU-CHEK FASTCLIX LANCETS MISC 1 Package by Does not apply route 2 (two) times daily. 100 each 0   acetaminophen (TYLENOL) 500 MG tablet Take 500-1,000 mg by mouth every 6 (six) hours as needed for moderate pain or headache.     anastrozole (ARIMIDEX) 1 MG tablet Take 1 tablet (1 mg) by mouth once a week. 12 tablet 2   apixaban (ELIQUIS) 5 MG TABS tablet Take 1 tablet (5 mg total) by mouth 2 (two) times daily. 180 tablet 2   apixaban (ELIQUIS) 5 MG TABS tablet Take 1 tablet (5 mg total) by mouth 2 (two) times daily. 180 tablet 1   atomoxetine (STRATTERA) 100 MG capsule Take 1 capsule (100 mg total) by mouth daily. 90 capsule 0   augmented betamethasone dipropionate (DIPROLENE-AF) 0.05 % cream SMARTSIG:1 sparingly Topical Twice Daily     EPINEPHrine (EPIPEN 2-PAK) 0.3 mg/0.3 mL IJ SOAJ injection Inject 0.3 mg into the muscle as needed. 1 each 1   fexofenadine (ALLEGRA) 180 MG tablet Take 180 mg by mouth daily.     flecainide (TAMBOCOR) 100 MG tablet Take 1 tablet (100 mg total) by mouth every 12 (twelve) hours. 180 tablet 0   fluticasone (FLONASE) 50 MCG/ACT nasal spray Place 2 sprays into both nostrils daily. 16 g 6   glucose blood (ACCU-CHEK GUIDE) test strip 1 each by Other route 2 (two) times daily. Use as instructed 100 each 0   losartan (COZAAR) 100  MG tablet Take 1 tablet (100 mg total) by mouth daily. 90 tablet 3   Magnesium Oxide (MAG-OXIDE PO) Take 400 mg by mouth at bedtime.     metFORMIN (GLUCOPHAGE-XR) 500 MG 24 hr tablet Take 2 tablets (1,000 mg total) by mouth daily with breakfast. 180 tablet 1   metoprolol succinate (TOPROL-XL) 50 MG 24 hr  tablet Take 1 tablet (50 mg total) by mouth 2 (two) times daily with or immediately following a meal 180 tablet 0   omeprazole (PRILOSEC) 40 MG capsule Take 1 capsule (40 mg total) by mouth daily before breakfast. 90 capsule 2   risankizumab-rzaa (SKYRIZI PEN) 150 MG/ML pen Inject 1 pen injector subcutaneously every three months 1 mL 3   rosuvastatin (CRESTOR) 10 MG tablet Take 1 tablet (10 mg total) by mouth at bedtime. 90 tablet 1   Semaglutide, 2 MG/DOSE, (OZEMPIC, 2 MG/DOSE,) 8 MG/3ML SOPN Inject 2 mg as directed once a week. 3 mL 3   tadalafil (CIALIS) 5 MG tablet Take 2 tablets by mouth daily. (10 mg Total)     Testosterone Enanthate (XYOSTED) 100 MG/0.5ML SOAJ Inject 0.5 mLs (100 mg) into the skin once a week. 2 mL 5   No current facility-administered medications on file prior to visit.   Allergies  Allergen Reactions   Influenza Virus Vaccine Anaphylaxis   Levaquin [Levofloxacin In D5w] Swelling   Celexa [Citalopram] Other (See Comments)    Mental status changes    Dextrans Other (See Comments)    Makes the pt. Drowsy.    Hydrocodone-Acetaminophen     REACTION: itching   Latex    Oxycodone Itching   Shiitake Mushroom (Do Not Select) Rash   Family History  Problem Relation Age of Onset   Obesity Mother    Depression Father    Anxiety disorder Father    Bipolar disorder Father    Drug abuse Father    Cancer Maternal Grandfather        GBM    Diabetes Paternal Grandmother    Hypertension Paternal Grandmother    Diabetes Paternal Grandfather    Prostate cancer Paternal Grandfather    CAD Other        PGF   Colon cancer Neg Hx    PE: BP 132/84   Pulse 79   Ht 5\' 11"  (1.803 m)   Wt (!) 381 lb 3.2 oz (172.9 kg)   SpO2 95%   BMI 53.17 kg/m   Wt Readings from Last 3 Encounters:  01/27/23 (!) 381 lb 3.2 oz (172.9 kg)  01/15/23 (!) 376 lb (170.6 kg)  12/09/22 (!) 387 lb (175.5 kg)   Constitutional: overweight, in NAD Eyes:  EOMI, no exophthalmos ENT: no neck  masses, no cervical lymphadenopathy Cardiovascular: RRR, No MRG, + B leg swelling Respiratory: CTA B Musculoskeletal: no deformities Skin:no rashes except stasis dermatitis Neurological: no tremor with outstretched hands  ASSESSMENT: 1.  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  Hypogonadotropic hypogonadism  3. DM2 - per PCP  4. Obesity - per PCP - prev. Saw Dr. Dalbert Garnet in the weight management clinic  PLAN:  1.  History of pituitary adenoma -Status post TSR for a 1 cm tumor -final pathology showed prolactin secreting cells with less than 5% of the cells staining for ACTH -Reviewing his pituitary labs from 2017-2021, they were normal with the exception of the gonadotropin axis: Low testosterone, FSH, LH, but at the last check, IGF-I was also low.  At that time, we discussed the pluses and minuses of growth hormone therapy.  He was under investigation for enlarged lymph nodes with a questionable lymphoma diagnosis.  We discussed that close hormonal treatment would not be a good idea.  At last visit, we repeated due to the hormonal investigation (without LH and FSH) and this was normal with the exception of a low IGF-I. -Reviewed the report of the latest pituitary MRI from 09/2017 and this showed no recurrence.  He is also followed by Dr. Angelyn Punt with neurosurgery -At last visit we discussed about repeating a pituitary MRI but he did not have this yet as he did not feel that he needed it.  I explained that tumors that stain for ACTH have a higher risk of recurrence and would definitely recommend to get another MRI now 5 years after the previous.  He agrees  -will discuss with neurosurgery at [facility] to order this in the same system. -Will recheck his pituitary tests at next lab draw-he will return for these as he is not fasting today -will see the patient back in 1 year  2.  Hypogonadotropic hypogonadism -He has a long history of  hypogonadotropic hypogonadism, with normal prolactin -We were not able to use testosterone in the past for him as he and his wife were preparing for pregnancy.  Due to his wife's difficult to control diabetes, they are not pursuing this anymore. -At last visit, he returns after long absence of more than 3 years.  At that time, he was preparing to start hCG injections in preparation for pregnancy.  Of note, he tried Clomid but this did not help.  He was started on Xyosted once a week injections and Arimidex but had to come off due to headaches.  He will see Dr. Vickey Huger tomorrow. -Further management per urology -plan is to start p.o. testosterone  3.  Type 2 diabetes -He is on Ozempic 2 mg weekly.  He is considering switching to [facility]  We discussed that this is slightly stronger. -Latest HbA1c level was reviewed from 2 months ago and this was excellent, at 6.1% -This is currently managed by PCP -Patient is considering gastric bypass surgery -we discussed that this is a great idea  Orders Placed This Encounter  Procedures   TSH   T4, free   T3, free   Prolactin   Cortisol   ACTH   Component     Latest Ref Rng 01/28/2023  Triiodothyronine,Free,Serum     2.3 - 4.2 pg/mL 3.4   T4,Free(Direct)     0.8 - 1.8 ng/dL 1.1   Cortisol, Plasma     mcg/dL 7.8   TSH     1.61 - 0.96 mIU/L 2.97   Prolactin     2.0 - 18.0 ng/mL 1.6 (L)   C206 ACTH     6 - 50 pg/mL 17   Labs are normal with the exception of the slightly low prolactin, which is of no consequence.  Carlus Pavlov, MD PhD [facility] Endocrinology

## 2023-01-27 NOTE — Patient Instructions (Signed)
Please get a new pituitary MRI.  Please stop at the lab.  Please come back for a follow-up appointment in 1 year.

## 2023-01-28 ENCOUNTER — Encounter: Payer: Self-pay | Admitting: Neurology

## 2023-01-28 ENCOUNTER — Other Ambulatory Visit: Payer: Commercial Managed Care - PPO

## 2023-01-28 ENCOUNTER — Other Ambulatory Visit (HOSPITAL_COMMUNITY): Payer: Self-pay

## 2023-01-28 ENCOUNTER — Ambulatory Visit: Payer: Commercial Managed Care - PPO | Admitting: Neurology

## 2023-01-28 VITALS — BP 150/69 | HR 85 | Ht 71.0 in | Wt 380.0 lb

## 2023-01-28 DIAGNOSIS — G4733 Obstructive sleep apnea (adult) (pediatric): Secondary | ICD-10-CM

## 2023-01-28 DIAGNOSIS — M542 Cervicalgia: Secondary | ICD-10-CM | POA: Insufficient documentation

## 2023-01-28 DIAGNOSIS — G43019 Migraine without aura, intractable, without status migrainosus: Secondary | ICD-10-CM | POA: Diagnosis not present

## 2023-01-28 DIAGNOSIS — Z87898 Personal history of other specified conditions: Secondary | ICD-10-CM | POA: Diagnosis not present

## 2023-01-28 DIAGNOSIS — I480 Paroxysmal atrial fibrillation: Secondary | ICD-10-CM | POA: Diagnosis not present

## 2023-01-28 DIAGNOSIS — E662 Morbid (severe) obesity with alveolar hypoventilation: Secondary | ICD-10-CM | POA: Insufficient documentation

## 2023-01-28 MED ORDER — CYCLOBENZAPRINE HCL 10 MG PO TABS
10.0000 mg | ORAL_TABLET | Freq: Every day | ORAL | 3 refills | Status: AC
  Filled 2023-01-28 – 2023-02-13 (×2): qty 90, 90d supply, fill #0
  Filled 2023-05-27: qty 90, 90d supply, fill #1
  Filled 2023-10-14: qty 90, 90d supply, fill #2

## 2023-01-28 NOTE — Addendum Note (Signed)
Addended by: Pollie Meyer on: 01/28/2023 08:22 AM   Modules accepted: Orders

## 2023-01-28 NOTE — Progress Notes (Signed)
[facility]  Provider:  Dr Nelissa Bolduc Referring Provider: Wanda Plump, MD Primary Care Physician:  Wanda Plump, MD  Chief Complaint  Patient presents with   New Patient (Initial Visit)    Patient in room #1 and alone. Patient states every since he stopped taking his testosterone his his migraines has gotten worse.    HPI:  Julian Washington is a 42 y.o. male and seen here upon referral from Dr. Drue Novel for a Consultation/ Evaluation of new onset headaches.  He had a  pituitary prolactinoma, a macroadenoma removed in 2014 by Dr Angelyn Punt, and his testosterone levels plummeted. He also developed anxiety and claustrophobia after that.   These new HA started in September with the addition of  testosterone ( Energy, Libido, and osteoporosis). He found relief when the testosterone was d/c.  Julian Washington, who works at the [employer] across the road, reports that his new headaches were intense started at the nape of the neck radiated upwards retroauricular into the temple on each side and affected the paraspinal area down to C6-C7.  There was some tenderness radiating over the clavicle.  He did not become nauseated but the intense neck and head pain did not affect his ability to work on a computer, the pain hindered him to have a comfortable posture while seated.  There was some photophobia,  He has been morbidly obese for all his life , has had other type headaches before, but these were severe,  not classic migraines.  He reports that this specific head and neck pain and tension would stay for 4 days or longer.  The patient has a history of type 2 diabetes but is not on long-term insulin, He carries a diagnosis of ADHD but is not on medication at this time.  Dr. Elvera Lennox is his endocrinologist who follows for the postsurgical changes after pituitary mass was removed. He just today had thyroid panel drawn today.   Dr Craige Cotta was his CPAP provider.  He is in need of a follow up soon. I can offer  him to remain at [facility] . He is a compliant user, 7-8 hours daily, including naps on CPAP.  He has been on CPAP, and is on his second machine : initially CPAP had helped morning headaches ( he developed these on his honeymoon cruise  14 years ago) . He is at high risk for OHV>  AHI is now under one per hour.  He may still have hypoxia ! He may need to switch to BiPAP, given his fatigue , his sleepiness.    The patient also  is not longer on Anastrazole ( ARIMIDEX)  for paroxysmal atrial fib. Was on metoprolol for rate control and HTN. Losartin was just doubled in dose, to now 100 mg from 50 mg.    In evaluation for bariatric surgery.      Review of Systems: Out of a complete 14 system review, the patient complains of only the following symptoms, and all other reviewed systems are negative.    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  Tobacco Use   Smoking status: Never   Smokeless tobacco: Never  Vaping  Use   Vaping status: Never Used  Substance and Sexual Activity   Alcohol use: Not Currently    Comment: none x 10 yrs   Drug use: No   Sexual activity: Not on file  Other Topics Concern   Not on file  Social History Narrative   Lives w/ wife   1 dog    Social Drivers of Health   Financial Resource Strain: Low Risk  (12/08/2022)   Overall Financial Resource Strain (CARDIA)    Difficulty of Paying Living Expenses: Not hard at all  Food Insecurity: No Food Insecurity (12/08/2022)   Hunger Vital Sign    Worried About Running Out of Food in the Last Year: Never true    Ran Out of Food in the Last Year: Never true  Transportation Needs: No Transportation Needs (12/08/2022)   PRAPARE -  Administrator, Civil Service (Medical): No    Lack of Transportation (Non-Medical): No  Physical Activity: Unknown (12/08/2022)   Exercise Vital Sign    Days of Exercise per Week: 0 days    Minutes of Exercise per Session: Not on file  Stress: No Stress Concern Present (12/08/2022)   [employer] of Occupational Health - Occupational Stress Questionnaire    Feeling of Stress : Only a little  Social Connections: Socially Integrated (12/08/2022)   Social Connection and Isolation Panel [NHANES]    Frequency of Communication with Friends and Family: More than three times a week    Frequency of Social Gatherings with Friends and Family: Once a week    Attends Religious Services: More than 4 times per year    Active Member of [employer] or Organizations: Yes    Attends Engineer, structural: More than 4 times per year    Marital Status: Married  Catering manager Violence: Unknown (01/16/2022)   Received from [employer], [facility]   HITS    Physically Hurt: Not on file    Insult or Talk Down To: Not on file    Threaten Physical Harm: Not on file    Scream or Curse: Not on file    Family History  Problem Relation Age of Onset   Obesity Mother    Depression Father    Anxiety disorder Father    Bipolar disorder Father    Drug abuse Father    Cancer Maternal Grandfather        GBM    Diabetes Paternal Grandmother    Hypertension Paternal Grandmother    Diabetes Paternal Grandfather    Prostate cancer Paternal Grandfather    CAD Other        PGF   Colon cancer Neg Hx     Past Medical History:  Diagnosis Date   ADHD    Anemia    Anxiety and depression    h/o suicidality w/ citalopram   Atrial fibrillation (HCC)    Diabetes mellitus without complication (HCC)    Difficult intubation    ETD (eustachian tube dysfunction)    s/p ENT, declined ear tuves before    GERD (gastroesophageal reflux disease)    History of chicken pox    Titered on 08/16/2005    Hypogonadism male    Dr Elvera Lennox, used to see urology   Infertility male    Internal hemorrhoids    s/p banding   Low testosterone    Migraine    on topamax   OSA on CPAP    Palpitations    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HCC)  h/o increased prolactin, Dr Elvera Lennox (previously @ [facility])   Prediabetes    A1C 6.2 years ago   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   Vitamin B 12 deficiency    h/o   Vitamin D deficiency     Past Surgical History:  Procedure Laterality Date   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-04-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, ACTH   TONSILLECTOMY AND ADENOIDECTOMY      Current Outpatient Medications  Medication Sig Dispense Refill   ACCU-CHEK FASTCLIX LANCETS MISC 1 Package by Does not apply route 2 (two) times daily. 100 each 0   acetaminophen (TYLENOL) 500 MG tablet Take 500-1,000 mg by mouth every 6 (six) hours as needed for moderate pain or headache.     anastrozole (ARIMIDEX) 1 MG tablet Take 1 tablet (1 mg) by mouth once a week. 12 tablet 2   apixaban (ELIQUIS) 5 MG TABS tablet Take 1 tablet (5 mg total) by mouth 2 (two) times daily. 180 tablet 2   apixaban (ELIQUIS) 5 MG TABS tablet Take 1 tablet (5 mg total) by mouth 2 (two) times daily. 180 tablet 1   atomoxetine (STRATTERA) 100 MG capsule Take 1 capsule (100 mg total) by mouth daily. 90 capsule 0   augmented betamethasone dipropionate (DIPROLENE-AF) 0.05 % cream SMARTSIG:1 sparingly Topical Twice Daily     EPINEPHrine (EPIPEN 2-PAK) 0.3 mg/0.3 mL IJ SOAJ injection Inject 0.3 mg into the muscle as needed. 1 each 1   fexofenadine (ALLEGRA) 180 MG tablet Take 180 mg by mouth daily.     flecainide (TAMBOCOR) 100 MG tablet Take 1 tablet (100 mg total) by mouth every 12 (twelve) hours. 180 tablet 0   fluticasone (FLONASE) 50 MCG/ACT nasal spray Place 2 sprays into both nostrils daily. 16 g 6   glucose blood (ACCU-CHEK GUIDE) test strip 1 each by Other route 2 (two) times daily. Use as instructed 100 each 0   losartan (COZAAR)  100 MG tablet Take 1 tablet (100 mg total) by mouth daily. 90 tablet 3   Magnesium Oxide (MAG-OXIDE PO) Take 400 mg by mouth at bedtime.     metFORMIN (GLUCOPHAGE-XR) 500 MG 24 hr tablet Take 2 tablets (1,000 mg total) by mouth daily with breakfast. 180 tablet 1   metoprolol succinate (TOPROL-XL) 50 MG 24 hr tablet Take 1 tablet (50 mg total) by mouth 2 (two) times daily with or immediately following a meal 180 tablet 0   omeprazole (PRILOSEC) 40 MG capsule Take 1 capsule (40 mg total) by mouth daily before breakfast. 90 capsule 2   risankizumab-rzaa (SKYRIZI PEN) 150 MG/ML pen Inject 1 pen injector subcutaneously every three months 1 mL 3   rosuvastatin (CRESTOR) 10 MG tablet Take 1 tablet (10 mg total) by mouth at bedtime. 90 tablet 1   Semaglutide, 2 MG/DOSE, (OZEMPIC, 2 MG/DOSE,) 8 MG/3ML SOPN Inject 2 mg as directed once a week. 3 mL 3   tadalafil (CIALIS) 5 MG tablet Take 2 tablets by mouth daily. (10 mg Total)     No current facility-administered medications for this visit.    Allergies as of 01/28/2023 - Review Complete 01/28/2023  Allergen Reaction Noted   Influenza virus vaccine Anaphylaxis 06/05/2022   Levaquin [levofloxacin in d5w] Swelling 07/24/2012   Celexa [citalopram] Other (See Comments) 12/29/2012   Dextrans Other (See Comments) 02/25/2014   Hydrocodone-acetaminophen  06/02/2009   Latex  06/02/2009   Oxycodone Itching 08/12/2012   Shiitake mushroom (do not select) Rash 12/02/2020  Vitals: BP (!) 150/69 (BP Location: Left Arm, Patient Position: Sitting, Cuff Size: Normal)   Pulse 85   Ht 5\' 11"  (1.803 m)   Wt (!) 380 lb (172.4 kg)   BMI 53.00 kg/m  Last Weight:  Wt Readings from Last 1 Encounters:  01/28/23 (!) 380 lb (172.4 kg)   Last Height:   Ht Readings from Last 1 Encounters:  01/28/23 5\' 11"  (1.803 m)   Last BMI: @ 53  Physical exam:  General: The patient is awake, alert and appears not in acute distress.  The patient is well groomed. Head:  Normocephalic, atraumatic.  Neck is supple. No Goiter   Neck circumference:  21 Cardiovascular:  Regular rate and palpable peripheral pulse:  Respiratory: clear to auscultation.  Mallampati 3 , Skin:  With evidence of edema, Trunk: see BMI   Neurologic exam : The patient is awake and alert, oriented to place and time.  Memory subjective  described as intact.  There is a normal attention span & concentration ability.  Speech is fluent without  dysarthria, dysphonia or aphasia.  Mood and affect are appropriate.  Cranial nerves: Pupils are equal and briskly reactive to light. Funduscopic exam without  evidence of pallor or edema. Extraocular movements  in vertical and horizontal planes intact and without nystagmus. Visual fields by finger perimetry are intact. Hearing to finger rub intact.  Facial sensation intact to fine touch. Facial motor strength is symmetric and tongue and uvula move midline.  Motor exam:   Normal tone and normal muscle bulk and symmetric normal strength in all extremities. Grip Strength  Proximal strength of shoulder muscles and hip flexors was intact .  Sensory:  Fine touch and vibration were tested . Proprioception was tested in the upper extremities only and was normal.  Coordination:  no evidence of ataxia, dysmetria or tremor.  Gait and station: Patient walked without assistive device , Core Strength within normal limits. Stance is stable and of wide  base.   Deep tendon reflexes: in the  upper and lower extremities are symmetrically attenuated.  Assessment: Total time for face to face interview and examination, for review of  images and laboratory testing, neurophysiology testing and pre-existing records, including out-of -network , was 45 minutes. 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  However his headaches have not the much of the vascular quality but seem to be more of a tension quality they affect both sides of the head and neck they radiate from the occipital region and nape of the neck behind the ear towards each temple, but they do not meet in the top crown of the head nor is a round device around the head.  He has experienced migraines in the past and these headaches now are distinctly different.  They also do not radiate into the upper extremities but does seem to manifest mostly over the paraspinal area of the cervical spine down to C6-C7.   These headaches have lasted for several days at a time. He had in the past experience morning headaches which were dull not throbbing and would normally resolve within the day and without the need of taking medication.  Morning headaches are often associated with hypoxia and can be seen in patients at high risk for obesity hypoventilation.  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  This is to confirm or rule out that hypoxemia contributes to some of the headaches.   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  Any patient is paroxysmal atrial fibrillation we will confirm that the spells need also to a significant increase in sudden fatigue but should not be present every day.   30 I would like to offer the patient a muscle relaxant that he can take should these headaches rearise, and I would also consider treatment by Botox if there is an incomplete resolution or if the patient has not responded to several medications at a normal dose.  Plan:   Treatment plan and additional workup planned after today includes:  see above .  ONO on CPAP. Will follow result but CPAP Management will be at [facility].   Flexaril ordered for PRN use. 10 mg HS prn.  Prn RV     Melvyn Novas, MD

## 2023-01-28 NOTE — Patient Instructions (Signed)
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  However his headaches have not the much of the vascular quality but seem to be more of a tension quality they affect both sides of the head and neck they radiate from the occipital region and nape of the neck behind the ear towards each temple, but they do not meet in the top crown of the head nor is a round device around the head.  He has experienced migraines in the past and these headaches now are distinctly different.  They also do not radiate into the upper extremities but does seem to manifest mostly over the paraspinal area of the cervical spine down to C6-C7.     These headaches have lasted for several days at a time. He had in the past experience morning headaches which were dull not throbbing and would normally resolve within the day and without the need of taking medication.   Morning headaches are often associated with hypoxia and can be seen in patients at high risk for obesity hypoventilation.  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  This is to confirm or rule out that hypoxemia contributes to some of the headaches.     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  Any patient is paroxysmal atrial fibrillation we will confirm that the spells need also to a significant increase in sudden fatigue but should not be present every day.     30 I would like to offer the patient a muscle relaxant that he can take should these  headaches rearise, and I would also consider treatment by Botox if there is an incomplete resolution or if the patient has not responded to several medications at a normal dose.   Plan:  Treatment plan and additional workup planned after today includes:  see above .   ONO on CPAP. Will follow result but CPAP Management will be at [facility].    Flexaril ordered for PRN use. 10 mg HS prn.  Prn RV

## 2023-01-31 ENCOUNTER — Encounter: Payer: Self-pay | Admitting: Adult Health

## 2023-01-31 ENCOUNTER — Ambulatory Visit: Payer: Commercial Managed Care - PPO | Admitting: Internal Medicine

## 2023-01-31 ENCOUNTER — Telehealth: Payer: Commercial Managed Care - PPO | Admitting: Adult Health

## 2023-01-31 DIAGNOSIS — G4733 Obstructive sleep apnea (adult) (pediatric): Secondary | ICD-10-CM | POA: Diagnosis not present

## 2023-01-31 NOTE — Progress Notes (Signed)
Virtual Visit via Video Note  I connected with Julian Washington on 01/31/23 at  3:00 PM EST by a video enabled telemedicine application and verified that I am speaking with the correct person using two identifiers.  Location: Patient: Home  Provider: Office    I discussed the limitations of evaluation and management by telemedicine and the availability of in person appointments. The patient expressed understanding and agreed to proceed.  History of Present Illness: 42 year old male followed for obstructive sleep apnea Medical history significant for diabetes, atrial fibrillation, hypertension, psoriasis, history of pituitary tumor status post adenoma removal in 2014, ADHD Clinical nurse specialist at [facility]   Today's video visit is a 1 year follow-up.  Patient has underlying sleep apnea is on nocturnal CPAP.  Patient says he wears a CPAP every single night.  Cannot sleep without it.  Typically gets in about 7 hours of usage.  Uses a nasal mask.  Feels that he benefits from CPAP with decreased daytime sleepiness.  CPAP download shows excellent compliance with daily average usage at 7.5 hours, AHI 0.1/hour patient is on CPAP 15 cm H2O.  Patient has been following with neurology and been having some increased headache frequency and daytime fatigue.  He has had some medication changes which has helped some.  Neurology did want him to have a overnight oximetry test to make sure he has not hypoxic and he is at risk for combination of OSA and OHS.  Weight has been trending up current weight at 380 pounds with a BMI of 53.  Patient is working on weight loss.  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   Patient is at risk for combination of OSA and OHS.  BMI of 53.  Patient does have morning headaches and general fatigue.  Will set patient up for an overnight oximetry test on CPAP.  He has excellent control currently with no residual events.  If notable hypoxemia on CPAP would consider a CPAP titration study as he may need oxygen and/or bilevel device. - discussed how weight can impact sleep and risk for sleep disordered breathing - discussed options to assist with weight loss: combination of diet modification, cardiovascular and strength training exercises   - had an extensive discussion regarding the adverse health consequences related to untreated sleep disordered breathing - specifically discussed the risks for hypertension, coronary artery disease, cardiac dysrhythmias, cerebrovascular disease, and diabetes - lifestyle modification discussed   - discussed how sleep disruption can increase risk of accidents, particularly when driving - safe driving practices were discussed  Headaches/ fatigue -questionable etiology.  Check for nocturnal hypoxemia.  Overnight oximetry test on CPAP/room air ordered.  Pending these results decide on next step.  If positive will need an in lab titration study.    Plan  Patient Instructions  Continue on CPAP at bedtime goal is to wear for more than 6 hours each night Set up for an overnight oximetry test on CPAP-room air Keep up the good work Work on healthy weight loss Do not drive if sleepy Follow-up with in 1 year and As needed  Follow Up Instructions:    I discussed the assessment and treatment plan with the patient. The patient was provided an opportunity to ask questions and all were answered. The patient agreed with the plan and demonstrated an understanding of the instructions.   The patient was advised to call back or  seek an in-person evaluation if the symptoms worsen or if the condition fails to improve as anticipated.  I provided 30 minutes of non-face-to-face time during this encounter.   Rubye Oaks, NP

## 2023-01-31 NOTE — Patient Instructions (Addendum)
Continue on CPAP at bedtime goal is to wear for more than 6 hours each night Set up for an overnight oximetry test on CPAP-room air Keep up the good work Work on healthy weight loss Do not drive if sleepy Follow-up with in 1 year and As needed

## 2023-02-01 LAB — T4, FREE: Free T4: 1.1 ng/dL (ref 0.8–1.8)

## 2023-02-01 LAB — ACTH: C206 ACTH: 17 pg/mL (ref 6–50)

## 2023-02-01 LAB — T3, FREE: T3, Free: 3.4 pg/mL (ref 2.3–4.2)

## 2023-02-01 LAB — TSH: TSH: 2.97 m[IU]/L (ref 0.40–4.50)

## 2023-02-01 LAB — PROLACTIN: Prolactin: 1.6 ng/mL — ABNORMAL LOW (ref 2.0–18.0)

## 2023-02-01 LAB — CORTISOL: Cortisol, Plasma: 7.8 ug/dL

## 2023-02-03 NOTE — Addendum Note (Signed)
Addended by: Carlus Pavlov on: 02/03/2023 01:44 PM   Modules accepted: Orders

## 2023-02-07 ENCOUNTER — Other Ambulatory Visit (HOSPITAL_COMMUNITY): Payer: Self-pay

## 2023-02-13 ENCOUNTER — Other Ambulatory Visit: Payer: Self-pay

## 2023-02-13 ENCOUNTER — Other Ambulatory Visit (HOSPITAL_COMMUNITY): Payer: Self-pay

## 2023-02-15 MED FILL — Omeprazole Cap Delayed Release 40 MG: ORAL | 90 days supply | Qty: 90 | Fill #1 | Status: AC

## 2023-02-17 ENCOUNTER — Other Ambulatory Visit (HOSPITAL_COMMUNITY): Payer: Self-pay

## 2023-02-17 ENCOUNTER — Other Ambulatory Visit: Payer: Self-pay

## 2023-02-17 ENCOUNTER — Encounter (HOSPITAL_COMMUNITY): Payer: Self-pay

## 2023-02-17 ENCOUNTER — Other Ambulatory Visit (HOSPITAL_COMMUNITY): Payer: Self-pay | Admitting: Pharmacy Technician

## 2023-02-17 NOTE — Progress Notes (Signed)
 Specialty Pharmacy Ongoing Clinical Assessment Note  Julian Washington is a 43 y.o. male who is being followed by the specialty pharmacy service for RxSp Psoriasis   Patient's specialty medication(s) reviewed today: Risankizumab -rzaa (Skyrizi  Pen)   Missed doses in the last 4 weeks: 0   Patient/Caregiver did not have any additional questions or concerns.   Therapeutic benefit summary: Patient is achieving benefit   Adverse events/side effects summary: No adverse events/side effects   Patient's therapy is appropriate to: Continue    Goals Addressed             This Visit's Progress    Minimize recurrence of flares       Patient is not on track and improving. Patient will maintain adherence.  Patient reports that he is overall mostly well-controlled with a couple of spots that he will discuss with his provider at his next appointment.         Follow up:  6 months  Julian Washington Specialty Pharmacist

## 2023-02-17 NOTE — Progress Notes (Signed)
 Specialty Pharmacy Refill Coordination Note  Julian Washington is a 43 y.o. male contacted today regarding refills of specialty medication(s) Risankizumab -rzaa (Skyrizi  Pen)   Patient requested [name] at [facility] [place] date: 03/04/23   Medication will be filled on 03/03/23.    Sent refill request if denied please call patient to let him know. also send to [facility] for re-write

## 2023-02-18 ENCOUNTER — Ambulatory Visit: Payer: Commercial Managed Care - PPO | Attending: Internal Medicine | Admitting: Pharmacist

## 2023-02-18 ENCOUNTER — Other Ambulatory Visit (HOSPITAL_COMMUNITY): Payer: Self-pay

## 2023-02-18 ENCOUNTER — Other Ambulatory Visit: Payer: Self-pay

## 2023-02-18 DIAGNOSIS — Z79899 Other long term (current) drug therapy: Secondary | ICD-10-CM

## 2023-02-18 MED ORDER — SKYRIZI PEN 150 MG/ML ~~LOC~~ SOAJ
SUBCUTANEOUS | 3 refills | Status: DC
  Filled 2023-02-18: qty 1, 90d supply, fill #0

## 2023-02-18 MED ORDER — SKYRIZI PEN 150 MG/ML ~~LOC~~ SOAJ
SUBCUTANEOUS | 3 refills | Status: DC
  Filled 2023-02-18: qty 1, fill #0
  Filled 2023-02-27: qty 1, 84d supply, fill #0
  Filled 2023-05-13 (×2): qty 1, 84d supply, fill #1
  Filled 2023-08-22: qty 1, 84d supply, fill #2
  Filled 2023-11-18 (×2): qty 1, 84d supply, fill #3

## 2023-02-18 NOTE — Progress Notes (Signed)
   S: Patient presents for review of their specialty medication therapy.  Patient is currently taking Skyrizi  for psoriasis. Patient is managed by Dr. Court for this.   Adherence: confirms  Efficacy: reports that this works well for him. Has been on for ~3 years.  Dosing: Plaque psoriasis, moderate to severe: SubQ: Two consecutive injections (75 mg each) for a total dose of 150 mg at weeks 0, 4, and then every 12 weeks thereafter.  Screening: TB test: completed  Monitoring: S/sx of infection: none  S/sx of hypersensitivity/injection site reaction: none   O:     Lab Results  Component Value Date   WBC 7.1 08/02/2022   HGB 13.5 08/02/2022   HCT 41.7 08/02/2022   MCV 81.3 08/02/2022   PLT 181.0 08/02/2022      Chemistry      Component Value Date/Time   NA 140 12/09/2022 0849   NA 141 11/20/2016 0811   K 4.2 12/09/2022 0849   CL 108 12/09/2022 0849   CO2 26 12/09/2022 0849   BUN 12 12/09/2022 0849   BUN 18 11/20/2016 0811   CREATININE 0.74 12/09/2022 0849   CREATININE 0.75 07/09/2019 0942   CREATININE 0.73 01/19/2016 1624      Component Value Date/Time   CALCIUM  8.6 12/09/2022 0849   ALKPHOS 55 08/02/2022 0919   AST 13 08/02/2022 0919   AST 16 07/09/2019 0942   ALT 14 08/02/2022 0919   ALT 25 07/09/2019 0942   BILITOT 0.6 08/02/2022 0919   BILITOT 0.5 07/09/2019 0942       A/P: 1. Medication review: Patient currently on Skyrizi  for psoriasis. Reviewed the medication with the patient, including the following: Skyrizi  is a monoclonal antibody used in the treatment of psoriasis. Patient educated on purpose, proper use and potential adverse effects of Skyrizi . Possible adverse effects are infections, headache, and injection site reactions. Live vaccinations should be avoided while on therapy. SubQ: Administer the two consecutive injections subcutaneously at different anatomic locations, such as thighs, abdomen, or back of upper arms. Do not inject into areas  where the skin is tender, bruised, red, hard, or affected by psoriasis. Intended for use under supervision of a health care professional; self-injection may occur after proper training (except back of upper arms). No recommendations for any changes at this time.  Herlene Fleeta Morris, PharmD, JAQUELINE, CPP Clinical Pharmacist [facility] [phone]

## 2023-02-27 ENCOUNTER — Other Ambulatory Visit: Payer: Self-pay

## 2023-02-27 ENCOUNTER — Other Ambulatory Visit (HOSPITAL_COMMUNITY): Payer: Self-pay

## 2023-02-28 ENCOUNTER — Telehealth: Payer: Self-pay

## 2023-02-28 ENCOUNTER — Ambulatory Visit (INDEPENDENT_AMBULATORY_CARE_PROVIDER_SITE_OTHER): Payer: Commercial Managed Care - PPO | Admitting: Internal Medicine

## 2023-02-28 ENCOUNTER — Encounter: Payer: Self-pay | Admitting: Internal Medicine

## 2023-02-28 DIAGNOSIS — Z6841 Body Mass Index (BMI) 40.0 and over, adult: Secondary | ICD-10-CM | POA: Diagnosis not present

## 2023-02-28 DIAGNOSIS — I10 Essential (primary) hypertension: Secondary | ICD-10-CM | POA: Diagnosis not present

## 2023-02-28 DIAGNOSIS — E119 Type 2 diabetes mellitus without complications: Secondary | ICD-10-CM

## 2023-02-28 NOTE — Telephone Encounter (Signed)
Bariatric surgery form completed and faxed back to [facility] at [phone]. Form sent for scanning.

## 2023-02-28 NOTE — Progress Notes (Unsigned)
Subjective:    Patient ID: Julian Washington, male    DOB: 1980/10/03, 43 y.o.   MRN: [id number]  DOS:  02/28/2023 Type of visit - description: Weight loss management  Here for weight loss management. Letter of necessity provided.  Recently, started oral testosterone. Also, BP was noted to be elevated when he visited cardiology, losartan dose adjusted. No recent ambulatory BPs.  Denies chest pain or difficulty breathing. Lower extremity edema at baseline   Wt Readings from Last 3 Encounters:  02/28/23 (!) 378 lb 4 oz (171.6 kg)  01/28/23 (!) 380 lb (172.4 kg)  01/27/23 (!) 381 lb 3.2 oz (172.9 kg)     Review of Systems See above   Past Medical History:  Diagnosis Date   ADHD    Anemia    Anxiety and depression    h/o suicidality w/ citalopram   Atrial fibrillation (HCC)    Diabetes mellitus without complication (HCC)    Difficult intubation    ETD (eustachian tube dysfunction)    s/p ENT, declined ear tuves before    GERD (gastroesophageal reflux disease)    History of chicken pox    Titered on 08/16/2005   Hypogonadism male    Dr Elvera Lennox, used to see urology   Infertility male    Internal hemorrhoids    s/p banding   Low testosterone    Migraine    on topamax   OSA on CPAP    Palpitations    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HCC)    h/o increased prolactin, Dr Elvera Lennox (previously @ [facility])   Prediabetes    A1C 6.2 years ago   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   Vitamin B 12 deficiency    h/o   Vitamin D deficiency     Past Surgical History:  Procedure Laterality Date   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-04-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, ACTH   TONSILLECTOMY AND ADENOIDECTOMY      Current Outpatient Medications  Medication Instructions   ACCU-CHEK FASTCLIX LANCETS MISC 1 Package, Does not apply, 2 times daily   acetaminophen (TYLENOL) 500-1,000 mg, Every 6 hours PRN   atomoxetine (STRATTERA) 100 mg, Oral, Daily   augmented betamethasone dipropionate (DIPROLENE-AF)  0.05 % cream SMARTSIG:1 sparingly Topical Twice Daily   cyclobenzaprine (FLEXERIL) 10 mg, Oral, Daily at bedtime   Eliquis 5 mg, Oral, 2 times daily   Eliquis 5 mg, Oral, 2 times daily   EPINEPHrine (EPIPEN 2-PAK) 0.3 mg, Intramuscular, As needed   fexofenadine (ALLEGRA) 180 mg, Daily   flecainide (TAMBOCOR) 100 MG tablet Take 1 tablet (100 mg total) by mouth every 12 (twelve) hours.   fluticasone (FLONASE) 50 MCG/ACT nasal spray 2 sprays, Each Nare, Daily   glucose blood (ACCU-CHEK GUIDE) test strip 1 each, Other, 2 times daily, Use as instructed    losartan (COZAAR) 100 mg, Oral, Daily   Magnesium Oxide (MAG-OXIDE PO) 400 mg, Daily at bedtime   metFORMIN (GLUCOPHAGE-XR) 1,000 mg, Oral, Daily with breakfast   metoprolol succinate (TOPROL-XL) 50 MG 24 hr tablet Take 1 tablet (50 mg total) by mouth 2 (two) times daily with or immediately following a meal   omeprazole (PRILOSEC) 40 mg, Oral, Daily before breakfast   Ozempic (2 MG/DOSE) 2 mg, Injection, Weekly   risankizumab-rzaa (SKYRIZI PEN) 150 MG/ML pen Inject 1 pen injector subcutaneously every three months   rosuvastatin (CRESTOR) 10 mg, Oral, Daily at bedtime   Testosterone Undecanoate 100 MG CAPS 1 capsule, 2 times daily  Objective:   Physical Exam BP (!) 144/86   Pulse 76   Temp 98 F (36.7 C) (Oral)   Resp 18   Ht 5\' 11"  (1.803 m)   Wt (!) 378 lb 4 oz (171.6 kg)   SpO2 95%   BMI 52.76 kg/m  General:   Well developed, NAD, BMI noted. HEENT:  Normocephalic . Face symmetric, atraumatic Lungs:  CTA B Normal respiratory effort, no intercostal retractions, no accessory muscle use. Heart: Irregularly irregular Lower extremities: Non-pitting edema noted, skin is red/blueish, dry, not warm Skin: Not pale. Not jaundice Neurologic:  alert & oriented X3.  Speech normal, gait appropriate for age and unassisted Psych--  Cognition and judgment appear intact.  Cooperative with normal attention span and concentration.   Behavior appropriate. No anxious or depressed appearing.      Assessment   Assessment   DM Chronic venous insufficiency/lymphedema/stasis dermatitis PSYCH: --Anxiety depression (suicidality w/  Citalopram) --ADHD Dx 2019, Dr Elisabeth Most Morbid obesity Vitamin D and  B12 deficiency CV: New onset A. fib 11-2020.  Admitted  Calcium coronary score 0 (11-2020) Endocrinology:Dr Gherghe --Pituitary mass, surgery : Prolactinoma --Hypogonadism hypogonadotropic --Infertility Migraines, d/c Topamax 05-2015 (pt concerned about short term memory)  OSA on CPAP -- DR Craige Cotta Hematology -Mild chronic anemia, no previous colonoscopy or EGD. Iron , vitamins normal.  saw hematology 09/2017  -Splenomegaly: Per CT 09/20/2017, saw hematology --Abdominal lymphadenopathies: Felt to be stable, see hematology note from July 14, 2019. Pulmonary nodules: Felt to be stable per last CT. DERM: Psoriasis  H/o ET  dysfunction, declined ear tubes H/o hemorrhoids s/p  banding H/o abd pain 2012 (w/u CT, [place], a HIDA scan), similar sx 02-2015 H/o  difficulty intubation    PLAN: Morbid obesity: Since 2014 when he was establishing me, we have talked about diet and exercise at almost every visit.  He also has tried numerous time [employer] and other programs. Specifically: 2017: Tried Saxenda 2023: Weight watchers 2023: Seen at the wellness clinic 2024: on Ozempic I believe he is a good candidate for bariatric surgery.  Letter of support will be provided. HTN: Losartan increased from 50 mg to 100 mg by cardiology in early December.  He also takes metoprolol XL 50 mg twice daily.  BP today is slightly elevated.  Plan: BMP, monitor BPs, let me know if elevated. RTC already scheduled for 04-2023  Headache: As described above, has a history of migraines, had similar episode 10/2021, CT head at that time was negative. He is holding testosterone and Ozempic and since then headaches are are better. Start Topamax?   Patient did not like how he felt we did.  Calcium channel blockers?  They cause edema.  Imitrex?  Patient declined a trial. Plan: Reintroduce Ozempic and see if it is truly causing the headache See neurology as scheduled Seek medical attention if severe headaches. DM: On metformin and Ozempic , see above, check A1c A-fib: BP and heart rate very good, continue present care, check BMP Preventive care: Recommend COVID-vaccine RTC 4 months.

## 2023-02-28 NOTE — Patient Instructions (Addendum)
Vaccines I recommend: Covid booster Prevnar 20    Start checking your blood pressure regularly Blood pressure goal:  between 110/65 and  135/85. If it is consistently higher or lower, let me know     GO TO THE LAB : Get the blood work     Next visit with me already scheduled for March   Per our records you are soon due for your diabetic eye exam. Please contact your eye doctor to schedule an appointment. Please have them send copies of your office visit notes to [place]. Our fax number is [phone]. If you need a referral to an eye doctor please let us know.

## 2023-03-01 LAB — BASIC METABOLIC PANEL
BUN: 15 mg/dL (ref 7–25)
CO2: 27 mmol/L (ref 20–32)
Calcium: 9.2 mg/dL (ref 8.6–10.3)
Chloride: 103 mmol/L (ref 98–110)
Creat: 0.86 mg/dL (ref 0.60–1.29)
Glucose, Bld: 94 mg/dL (ref 65–99)
Potassium: 4.9 mmol/L (ref 3.5–5.3)
Sodium: 138 mmol/L (ref 135–146)

## 2023-03-02 NOTE — Assessment & Plan Note (Signed)
Morbid obesity: Since 2014 when he got established w/ me, we have talked about diet and exercise at almost every visit.  He also has tried numerous times weight watchers and other programs. Specifically: 2017: Tried Saxenda 2023: Weight watchers 2023: Seen at the wellness clinic 2024: on Ozempic I believe he is a good candidate for bariatric surgery.  Letter of support will be provided. HTN: Losartan increased from 50 mg to 100 mg by cardiology in early December.  He also takes metoprolol XL 50 mg twice daily.  BP today is slightly elevated.  Plan: BMP, monitor BPs, let me know if elevated. RTC already scheduled for 04-2023

## 2023-03-03 ENCOUNTER — Encounter: Payer: Self-pay | Admitting: Internal Medicine

## 2023-03-03 ENCOUNTER — Other Ambulatory Visit: Payer: Self-pay

## 2023-03-04 ENCOUNTER — Other Ambulatory Visit: Payer: Self-pay

## 2023-03-04 ENCOUNTER — Other Ambulatory Visit (HOSPITAL_COMMUNITY): Payer: Self-pay

## 2023-03-07 ENCOUNTER — Telehealth: Payer: Self-pay | Admitting: Adult Health

## 2023-03-07 NOTE — Telephone Encounter (Signed)
Jill Alexanders from [employer] called answering service 1/23 @5 :27 stating diagnostics code is missing for pulse ox test. Please advise

## 2023-03-11 NOTE — Telephone Encounter (Signed)
Left a vm asking Jill Alexanders for a call back for our office.

## 2023-03-13 DIAGNOSIS — H5213 Myopia, bilateral: Secondary | ICD-10-CM | POA: Diagnosis not present

## 2023-03-13 LAB — HM DIABETES EYE EXAM

## 2023-03-13 NOTE — Telephone Encounter (Signed)
Unable to leave a message as Justin's mailbox is full. Will try again later.

## 2023-03-21 ENCOUNTER — Ambulatory Visit: Payer: Commercial Managed Care - PPO | Admitting: Adult Health

## 2023-03-26 ENCOUNTER — Other Ambulatory Visit: Payer: Self-pay | Admitting: Internal Medicine

## 2023-03-26 NOTE — Telephone Encounter (Signed)
3rd attempt to reach [place] at [employer] VM not accepting any new message. Closing per protocol.

## 2023-03-27 ENCOUNTER — Other Ambulatory Visit: Payer: Self-pay

## 2023-03-27 ENCOUNTER — Other Ambulatory Visit (HOSPITAL_COMMUNITY): Payer: Self-pay

## 2023-03-27 MED ORDER — METFORMIN HCL ER 500 MG PO TB24
1000.0000 mg | ORAL_TABLET | Freq: Every day | ORAL | 1 refills | Status: DC
  Filled 2023-03-27: qty 180, 90d supply, fill #0
  Filled 2023-06-30 (×2): qty 180, 90d supply, fill #1

## 2023-03-27 MED ORDER — ROSUVASTATIN CALCIUM 10 MG PO TABS
10.0000 mg | ORAL_TABLET | Freq: Every day | ORAL | 1 refills | Status: DC
  Filled 2023-03-27 – 2023-04-02 (×3): qty 90, 90d supply, fill #0
  Filled 2023-06-30 (×2): qty 90, 90d supply, fill #1

## 2023-03-28 DIAGNOSIS — E785 Hyperlipidemia, unspecified: Secondary | ICD-10-CM | POA: Diagnosis not present

## 2023-03-28 DIAGNOSIS — E119 Type 2 diabetes mellitus without complications: Secondary | ICD-10-CM | POA: Diagnosis not present

## 2023-03-28 DIAGNOSIS — G4733 Obstructive sleep apnea (adult) (pediatric): Secondary | ICD-10-CM | POA: Diagnosis not present

## 2023-03-28 DIAGNOSIS — I480 Paroxysmal atrial fibrillation: Secondary | ICD-10-CM | POA: Diagnosis not present

## 2023-03-28 DIAGNOSIS — K219 Gastro-esophageal reflux disease without esophagitis: Secondary | ICD-10-CM | POA: Diagnosis not present

## 2023-03-28 DIAGNOSIS — K760 Fatty (change of) liver, not elsewhere classified: Secondary | ICD-10-CM | POA: Diagnosis not present

## 2023-03-28 DIAGNOSIS — I10 Essential (primary) hypertension: Secondary | ICD-10-CM | POA: Diagnosis not present

## 2023-03-28 DIAGNOSIS — I872 Venous insufficiency (chronic) (peripheral): Secondary | ICD-10-CM | POA: Diagnosis not present

## 2023-03-30 ENCOUNTER — Other Ambulatory Visit: Payer: Self-pay | Admitting: Cardiology

## 2023-03-30 ENCOUNTER — Other Ambulatory Visit (HOSPITAL_COMMUNITY): Payer: Self-pay

## 2023-03-31 ENCOUNTER — Telehealth: Payer: Self-pay | Admitting: *Deleted

## 2023-03-31 ENCOUNTER — Other Ambulatory Visit: Payer: Self-pay

## 2023-03-31 NOTE — Telephone Encounter (Signed)
   Pre-operative Risk Assessment    Patient Name: Julian Washington  DOB: [birth year] MRN: [id number]   Date of last office visit: 01/15/23 DR. Lalla Brothers Date of next office visit: NONE   Request for Surgical Clearance    Procedure:   LAPAROSCOPIC ROUX-EN-Y  Date of Surgery:  Clearance TBD                                Surgeon:  DR. Gaynelle Adu Surgeon's Group or Practice Name:  [facility] Phone number:  [phone] Fax number:  [phone] Campbell Stall, CMA   Type of Clearance Requested:   - Medical  - Pharmacy:  Hold Apixaban (Eliquis)     Type of Anesthesia:  General    Additional requests/questions:    Elpidio Anis   03/31/2023, 10:38 AM

## 2023-03-31 NOTE — Telephone Encounter (Signed)
 Please advise holding Eliquis prior to laparoscopic Roux-en-Y.  Thank you!  DW

## 2023-04-01 ENCOUNTER — Other Ambulatory Visit: Payer: Self-pay

## 2023-04-01 ENCOUNTER — Other Ambulatory Visit (HOSPITAL_COMMUNITY): Payer: Self-pay

## 2023-04-01 MED ORDER — METOPROLOL SUCCINATE ER 50 MG PO TB24
50.0000 mg | ORAL_TABLET | Freq: Two times a day (BID) | ORAL | 3 refills | Status: DC
  Filled 2023-04-01: qty 180, 90d supply, fill #0
  Filled 2023-06-30: qty 60, 30d supply, fill #1
  Filled 2023-06-30: qty 180, 90d supply, fill #1

## 2023-04-01 MED ORDER — FLECAINIDE ACETATE 100 MG PO TABS
100.0000 mg | ORAL_TABLET | Freq: Two times a day (BID) | ORAL | 3 refills | Status: AC
  Filled 2023-04-01: qty 180, 90d supply, fill #0
  Filled 2023-06-30 (×2): qty 180, 90d supply, fill #1
  Filled 2023-10-14: qty 180, 90d supply, fill #2
  Filled 2024-01-11: qty 180, 90d supply, fill #3

## 2023-04-02 ENCOUNTER — Other Ambulatory Visit (HOSPITAL_COMMUNITY): Payer: Self-pay

## 2023-04-02 DIAGNOSIS — K219 Gastro-esophageal reflux disease without esophagitis: Secondary | ICD-10-CM | POA: Diagnosis not present

## 2023-04-02 DIAGNOSIS — I872 Venous insufficiency (chronic) (peripheral): Secondary | ICD-10-CM | POA: Diagnosis not present

## 2023-04-02 DIAGNOSIS — G4733 Obstructive sleep apnea (adult) (pediatric): Secondary | ICD-10-CM | POA: Diagnosis not present

## 2023-04-02 DIAGNOSIS — I480 Paroxysmal atrial fibrillation: Secondary | ICD-10-CM | POA: Diagnosis not present

## 2023-04-02 DIAGNOSIS — I10 Essential (primary) hypertension: Secondary | ICD-10-CM | POA: Diagnosis not present

## 2023-04-02 DIAGNOSIS — E785 Hyperlipidemia, unspecified: Secondary | ICD-10-CM | POA: Diagnosis not present

## 2023-04-02 DIAGNOSIS — E119 Type 2 diabetes mellitus without complications: Secondary | ICD-10-CM | POA: Diagnosis not present

## 2023-04-02 LAB — HEMOGLOBIN A1C: Hemoglobin A1C: 5.9

## 2023-04-02 LAB — LIPID PANEL
Cholesterol: 107 (ref 0–200)
HDL: 43 (ref 35–70)
LDL Cholesterol: 44
Triglycerides: 105 (ref 40–160)

## 2023-04-02 LAB — CBC AND DIFFERENTIAL
HCT: 42 (ref 41–53)
Hemoglobin: 13.8 (ref 13.5–17.5)
Neutrophils Absolute: 4.1
WBC: 6.6

## 2023-04-02 LAB — VITAMIN D 25 HYDROXY (VIT D DEFICIENCY, FRACTURES): Vit D, 25-Hydroxy: 25.7

## 2023-04-02 LAB — VITAMIN B12: Vitamin B-12: 418

## 2023-04-02 LAB — IRON,TIBC AND FERRITIN PANEL
%SAT: 19
Iron: 67
TIBC: 355
UIBC: 288

## 2023-04-02 LAB — CBC: RBC: 5.14 — AB (ref 3.87–5.11)

## 2023-04-02 NOTE — Telephone Encounter (Signed)
   Name: Julian Washington  DOB: 04/26/1980  MRN: [id number]  Primary Cardiologist: Lanier Prude, MD   Preoperative team, please contact this patient and set up a phone call appointment for further preoperative risk assessment. Please obtain consent and complete medication review. Thank you for your help.  I confirm that guidance regarding antiplatelet and oral anticoagulation therapy has been completed and, if necessary, noted below.  Per Pharm D, patient may hold Eliquis for 2 days prior to procedure.    I also confirmed the patient resides in the state of [place]. As per [facility] telemedicine laws, the patient must reside in the state in which the provider is licensed.   Carlos Levering, NP 04/02/2023, 2:12 PM [facility]

## 2023-04-02 NOTE — Telephone Encounter (Signed)
 Patient with diagnosis of afib on Eliquis for anticoagulation.    Procedure:  LAPAROSCOPIC ROUX-EN-Y  Date of procedure: TBD   CHA2DS2-VASc Score = 2   This indicates a 2.2% annual risk of stroke. The patient's score is based upon: CHF History: 0 HTN History: 1 Diabetes History: 1 Stroke History: 0 Vascular Disease History: 0 Age Score: 0 Gender Score: 0      CrCl 180 Platelet count 181  Per office protocol, patient can hold Eliquis for 2 days prior to procedure.    **This guidance is not considered finalized until pre-operative APP has relayed final recommendations.**

## 2023-04-03 NOTE — Telephone Encounter (Signed)
 Called patient to set up an a pre-op appt. Patient needed a tele visit but instead patient wanted to set up an 6 month f/u appt with Dr. Lalla Brothers, patient made an appt on 08/01/23. For a f/u and a per-op clearance.

## 2023-04-04 ENCOUNTER — Other Ambulatory Visit (HOSPITAL_COMMUNITY): Payer: Self-pay | Admitting: General Surgery

## 2023-04-04 DIAGNOSIS — G4733 Obstructive sleep apnea (adult) (pediatric): Secondary | ICD-10-CM | POA: Diagnosis not present

## 2023-04-09 ENCOUNTER — Other Ambulatory Visit (HOSPITAL_COMMUNITY): Payer: Self-pay

## 2023-04-14 ENCOUNTER — Ambulatory Visit (HOSPITAL_COMMUNITY)
Admission: RE | Admit: 2023-04-14 | Discharge: 2023-04-14 | Disposition: A | Discharge status: Home / Self Care | Payer: Commercial Managed Care - PPO | Source: Ambulatory Visit | Attending: General Surgery | Admitting: General Surgery

## 2023-04-14 DIAGNOSIS — Z01818 Encounter for other preprocedural examination: Secondary | ICD-10-CM | POA: Diagnosis not present

## 2023-04-15 ENCOUNTER — Other Ambulatory Visit: Payer: Self-pay

## 2023-04-15 ENCOUNTER — Encounter: Payer: Self-pay | Admitting: Cardiology

## 2023-04-15 ENCOUNTER — Other Ambulatory Visit (HOSPITAL_COMMUNITY): Payer: Self-pay

## 2023-04-15 MED ORDER — APIXABAN 5 MG PO TABS
5.0000 mg | ORAL_TABLET | Freq: Two times a day (BID) | ORAL | 2 refills | Status: DC
  Filled 2023-04-15: qty 180, 90d supply, fill #0
  Filled 2023-07-18: qty 180, 90d supply, fill #1
  Filled 2023-10-14: qty 180, 90d supply, fill #2

## 2023-04-15 NOTE — Telephone Encounter (Signed)
 Prescription refill request for Eliquis received. Indication:afib Last office visit:12/24 Scr:0.86  1/25 Age: 43 Weight:171.6  kg  Prescription refilled

## 2023-04-18 ENCOUNTER — Encounter: Payer: Self-pay | Admitting: Internal Medicine

## 2023-04-18 ENCOUNTER — Ambulatory Visit: Payer: Commercial Managed Care - PPO | Admitting: Internal Medicine

## 2023-04-18 VITALS — BP 136/84 | HR 77 | Temp 97.7°F | Resp 18 | Ht 71.0 in | Wt 390.0 lb

## 2023-04-18 DIAGNOSIS — Z6841 Body Mass Index (BMI) 40.0 and over, adult: Secondary | ICD-10-CM | POA: Diagnosis not present

## 2023-04-18 DIAGNOSIS — Z09 Encounter for follow-up examination after completed treatment for conditions other than malignant neoplasm: Secondary | ICD-10-CM

## 2023-04-18 NOTE — Progress Notes (Signed)
 Subjective:    Patient ID: Julian Washington, male    DOB: 08-22-1980, 43 y.o.   MRN: [id number]  DOS:  04/18/2023 Type of visit - description: Follow-up In general feeling well. Patient is on semaglutide, denies nausea vomiting diarrhea. Has recently seen surgery for eval of bariatric surgery.  Review of Systems See above   Past Medical History:  Diagnosis Date   ADHD    Anemia    Anxiety and depression    h/o suicidality w/ citalopram   Atrial fibrillation (HCC)    Diabetes mellitus without complication (HCC)    Difficult intubation    ETD (eustachian tube dysfunction)    s/p ENT, declined ear tuves before    GERD (gastroesophageal reflux disease)    History of chicken pox    Titered on 08/16/2005   Hypogonadism male    Dr Elvera Lennox, used to see urology   Infertility male    Internal hemorrhoids    s/p banding   Low testosterone    Migraine    on topamax   OSA on CPAP    Palpitations    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HCC)    h/o increased prolactin, Dr Elvera Lennox (previously @ [facility])   Prediabetes    A1C 6.2 years ago   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   Vitamin B 12 deficiency    h/o   Vitamin D deficiency     Past Surgical History:  Procedure Laterality Date   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-04-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, ACTH   TONSILLECTOMY AND ADENOIDECTOMY      Current Outpatient Medications  Medication Instructions   ACCU-CHEK FASTCLIX LANCETS MISC 1 Package, Does not apply, 2 times daily   acetaminophen (TYLENOL) 500-1,000 mg, Every 6 hours PRN   atomoxetine (STRATTERA) 100 mg, Oral, Daily   augmented betamethasone dipropionate (DIPROLENE-AF) 0.05 % cream SMARTSIG:1 sparingly Topical Twice Daily   cyclobenzaprine (FLEXERIL) 10 mg, Oral, Daily at bedtime   Eliquis 5 mg, Oral, 2 times daily   EPINEPHrine (EPIPEN 2-PAK) 0.3 mg, Intramuscular, As needed   fexofenadine (ALLEGRA) 180 mg, Daily   flecainide (TAMBOCOR) 100 mg, Oral, Every 12 hours   fluticasone  (FLONASE) 50 MCG/ACT nasal spray 2 sprays, Each Nare, Daily   glucose blood (ACCU-CHEK GUIDE) test strip 1 each, Other, 2 times daily, Use as instructed    losartan (COZAAR) 100 mg, Oral, Daily   Magnesium Oxide (MAG-OXIDE PO) 400 mg, Daily at bedtime   metFORMIN (GLUCOPHAGE-XR) 1,000 mg, Oral, Daily with breakfast   metoprolol succinate (TOPROL-XL) 50 MG 24 hr tablet Take 1 tablet (50 mg total) by mouth 2 (two) times daily with or immediately following a meal   omeprazole (PRILOSEC) 40 mg, Oral, Daily before breakfast   Ozempic (2 MG/DOSE) 2 mg, Injection, Weekly   risankizumab-rzaa (SKYRIZI PEN) 150 MG/ML pen Inject 1 pen injector subcutaneously every three months   rosuvastatin (CRESTOR) 10 mg, Oral, Daily at bedtime   Testosterone Undecanoate 100 MG CAPS 1 capsule, 2 times daily       Objective:   Physical Exam BP 136/84   Pulse 77   Temp 97.7 F (36.5 C) (Oral)   Resp 18   Ht 5\' 11"  (1.803 m)   Wt (!) 390 lb (176.9 kg)   SpO2 97%   BMI 54.39 kg/m  General:   Well developed, NAD, BMI noted. HEENT:  Normocephalic . Face symmetric, atraumatic Lungs:  CTA B Normal respiratory effort, no intercostal retractions, no  accessory muscle use. Heart: Seems regular .  Lower extremities: On compression stocking.  Edema is not pitting Skin: Not pale. Not jaundice Neurologic:  alert & oriented X3.  Speech normal, gait appropriate for age and unassisted Psych--  Cognition and judgment appear intact.  Cooperative with normal attention span and concentration.  Behavior appropriate. No anxious or depressed appearing.      Assessment    Assessment   DM Chronic venous insufficiency/lymphedema/stasis dermatitis PSYCH: --Anxiety depression (suicidality w/  Citalopram) --ADHD Dx 2019, Dr Elisabeth Most Morbid obesity Vitamin D and  B12 deficiency CV: New onset A. fib 11-2020.  Admitted  Calcium coronary score 0 (11-2020) Endocrinology:Dr Gherghe --Pituitary mass, surgery :  Prolactinoma --Hypogonadism hypogonadotropic --Infertility Migraines, d/c Topamax 05-2015 (pt concerned about short term memory)  OSA on CPAP -- DR Craige Cotta Hematology -Mild chronic anemia, no previous colonoscopy or EGD. Iron , vitamins normal.  saw hematology 09/2017  -Splenomegaly: Per CT 09/20/2017, saw hematology --Abdominal lymphadenopathies: Felt to be stable, see hematology note from July 14, 2019. Pulmonary nodules: Felt to be stable per last CT. DERM: Psoriasis  H/o ET  dysfunction, declined ear tubes H/o hemorrhoids s/p  banding H/o abd pain 2012 (w/u CT, [place], a HIDA scan), similar sx 02-2015 H/o  difficulty intubation    PLAN: Labs 04/02/2023: B12 normal.   White count, hemoglobin, platelet count: WNL. Vitamin D low. A1c 5.9 Iron, iron sat, TIBC WNL Total cholesterol 107, LDL 44. Morbid obesity: Saw Dr. Andrey Campanile  03/28/2023 they discussed bariatric surgery, also the potential benefit of switching semaglutide 2 mg weekly to [facility] Plan: Mounjaro 5 mg weekly x 1 month, then increase to 7.5. Watch for side effects. Hypogonadism: Unable to take testosterone due to headaches and elevated BP. ADHD: Strattera was stopped by ADHD provider DM: Controlled, does not check CBGs. Vaccinations: Declines PNM 20, recommend to consider COVID-vaccine

## 2023-04-19 ENCOUNTER — Other Ambulatory Visit (HOSPITAL_COMMUNITY): Payer: Self-pay

## 2023-04-19 ENCOUNTER — Encounter: Payer: Self-pay | Admitting: Internal Medicine

## 2023-04-19 MED ORDER — TIRZEPATIDE 5 MG/0.5ML ~~LOC~~ SOAJ
5.0000 mg | SUBCUTANEOUS | 0 refills | Status: DC
  Filled 2023-04-19: qty 2, 28d supply, fill #0

## 2023-04-19 NOTE — Assessment & Plan Note (Signed)
 Labs 04/02/2023: B12 normal.   White count, hemoglobin, platelet count: WNL. Vitamin D low. A1c 5.9 Iron, iron sat, TIBC WNL Total cholesterol 107, LDL 44. Morbid obesity: Saw Dr. Andrey Campanile  03/28/2023 they discussed bariatric surgery, also the potential benefit of switching semaglutide 2 mg weekly to [facility] Plan: Mounjaro 5 mg weekly x 1 month, then increase to 7.5. Watch for side effects. Hypogonadism: Unable to take testosterone due to headaches and elevated BP. ADHD: Strattera was stopped by ADHD provider DM: Controlled, does not check CBGs. Vaccinations: Declines PNM 20, recommend to consider COVID-vaccine

## 2023-04-21 ENCOUNTER — Ambulatory Visit (HOSPITAL_COMMUNITY)
Admission: RE | Admit: 2023-04-21 | Discharge: 2023-04-21 | Disposition: A | Discharge status: Home / Self Care | Source: Ambulatory Visit | Attending: General Surgery | Admitting: General Surgery

## 2023-04-21 ENCOUNTER — Other Ambulatory Visit (HOSPITAL_COMMUNITY): Payer: Self-pay

## 2023-04-21 ENCOUNTER — Encounter: Payer: Self-pay | Admitting: Dietician

## 2023-04-21 ENCOUNTER — Encounter (HOSPITAL_COMMUNITY): Payer: Self-pay

## 2023-04-21 ENCOUNTER — Encounter: Payer: Commercial Managed Care - PPO | Attending: Internal Medicine | Admitting: Dietician

## 2023-04-21 VITALS — Ht 69.5 in | Wt 385.7 lb

## 2023-04-21 DIAGNOSIS — E119 Type 2 diabetes mellitus without complications: Secondary | ICD-10-CM | POA: Diagnosis not present

## 2023-04-21 DIAGNOSIS — Z01818 Encounter for other preprocedural examination: Secondary | ICD-10-CM | POA: Diagnosis not present

## 2023-04-21 DIAGNOSIS — E669 Obesity, unspecified: Secondary | ICD-10-CM | POA: Diagnosis not present

## 2023-04-21 DIAGNOSIS — I10 Essential (primary) hypertension: Secondary | ICD-10-CM | POA: Diagnosis not present

## 2023-04-21 HISTORY — DX: Essential (primary) hypertension: I10

## 2023-04-21 NOTE — Progress Notes (Signed)
 Nutrition Assessment for Bariatric Surgery: Pre-Surgery Behavioral and Nutrition Intervention Program   Medical Nutrition Therapy  Appt Start Time: 1510    End Time: 1628  Patient was seen on 04/21/2023 for Pre-Operative Nutrition Assessment. Purpose of todays visit  enhance perioperative outcomes along with a healthy weight maintenance   Referral stated Supervised Weight Loss (SWL) visits needed: 0  Not cleared at this time:  Pt to follow up for minimum of one more visit to assist pt with progressing through stages of change/further nutrition education. RD advised pt that this follow up visit is not mandated through insurance. Pt verbalized agreement.  Planned surgery: RYGB Pt expectation of surgery: weight loss for getting rid of venous stasis, and getting off medications  NUTRITION ASSESSMENT   Anthropometrics  Start weight at [facility]: 385.7 lbs (date: 04/21/2023)  Height: 69.5 in BMI: 56.14 kg/m2     Clinical   Pharmacotherapy: History of weight loss medication used: ozempic, Mounjaro  Medical hx: HTN, hypercholesterolemia, obesity, A-fib RVR, sleep apnea, T2DM Medications: Mounjaro, rosuvastatin, skyrizi, omeprazole, metformin, magnesium oxide, losartan, flonase, flecainide, flexeril (as needed), eliquis, metopranole, adding Vit D Labs: vit D 25.7; A1c 5.9 Notable signs/symptoms: none noted Any previous deficiencies? No  Evaluation of Nutritional Deficiencies: Micronutrient Nutrition Focused Physical Exam: Hair: No issues observed Eyes: No issues observed Mouth: No issues observed Neck: No issues observed Nails: No issues observed Skin: No issues observed  Lifestyle & Dietary Hx  Pt states he has low testosterone, and has little energy.  Current Physical Activity Recommendations state 150 minutes per week of moderate to vigorous movement including Cardio and 1-2 days of resistance activities as well as flexibility/balance activities:  Pts current physical activity:  walking the dog 30 minutes, twice a day (not moderate intensity), with 0% recommendation reached  Sleep Hygiene: duration and quality: uses CPAP; good sleep  Current Patient Perceived Stress Level as stated by pt on a scale of 1-10:  3-4       Stress Management Techniques: engage with the dog  According to the Dietary Guidelines for Americans Recommendation: equivalent 1.5-2 cups fruits per day, equivalent 2-3 cups vegetables per day and at least half all grains whole  Fruit servings per day (on average): 1-2, meeting 66% recommendation  Non-starchy vegetable servings per day (on average): 2-3, meeting 100% recommendation  Whole Grains per day (on average): 0  Number of meals missed/skipped per week out of 21: 0  24-Hr Dietary Recall First Meal: gravy biscuit and sausage, or french toast sausage and applesauce milk Snack:  Second Meal: curry chicken rice bowl, rice crispy treat Snack:  Third Meal: [place] chicken breast, vegetable, mac and cheese or salmon with brussel sprout, roasted potatoes Snack:  Beverages: decaf coffee, decaf tea with stevia, vitamin water zero, zero sugar juice  Alcoholic beverages per week: 0   Estimated Energy Needs Calories: 1600  NUTRITION DIAGNOSIS  Overweight/obesity ([place]-3.3) related to past poor dietary habits and physical inactivity as evidenced by patient w/ planned RYGB surgery following dietary guidelines for continued weight loss.  NUTRITION INTERVENTION  Nutrition counseling (C-1) and education (E-2) to facilitate bariatric surgery goals.  Educated pt on micronutrient deficiencies post-surgery and behavioral/dietary strategies to start in order to mitigate that risk   Behavioral and Dietary Interventions Pre-Op Goals Reviewed with the Patient Nutrition: Healthy Eating Behaviors Switch to non-caloric, non-carbonated and non-caffeinated beverages such as  water, unsweetened tea, Crystal Light and zero calorie beverages (aim for 64 oz. per  day) Cut out grazing between  meals or at night  Find a protein shake you like Eat every 3-5 hours        Eliminate distractions while eating (TV, computer, reading, driving, texting) Take 16-10 minutes to eat a meal  Decrease high sugar foods/decrease high fat/fried foods Eliminate alcoholic beverages Increase protein intake (eggs, fish, chicken, yogurt) before surgery Eat non starchy vegetables 2 times a day 7 days a week Eat complex carbohydrates such as whole grains and fruits   Behavioral Modification: Physical Activity Increase my usual daily activity (use stairs, park farther, etc.) Engage in: walk the dog activity 30 minutes 3 times per week  Other:    Problem Solving I will think about my usual eating patterns and how to tweak them How can my friends and family support me Barriers to starting my changes Learn and understand appetite verses hunger   Healthy Coping Allow for ___________ activities per week to help me manage stress Reframe negative thoughts I will keep a picture of someone or something that is my inspiration & look at it daily   Monitoring  Weigh myself once a week  Measure my progress by monitoring how my clothes fit Keep a food record of what I eat and drink for the next ________ (time period) Take pictures of what I eat and drink for the next ________ (time period) Use an app to count steps/day for the next_______ (time period) Measure my progress such as increased energy and more restful sleep Monitor your acid reflux and bowel habits, are they getting better?   *Goals that are bolded indicate the pt would like to start working towards these  Handouts Provided Include  Bariatric Surgery handouts (Nutrition Visits, Pre Surgery Behavioral Change Goals, Protein Shakes Brands to Choose From, Vitamins & Mineral Supplementation)  Learning Style & Readiness for Change Teaching method utilized: Visual, Auditory, and hands on  Demonstrated degree of  understanding via: Teach Back  Readiness Level: preparation Barriers to learning/adherence to lifestyle change: low physical activity  RD's Notes for Next Visit Patient progress toward chosen goals.    MONITORING & EVALUATION Dietary intake, weekly physical activity, body weight, and preoperative behavioral change goals   Next Steps  Patient is to follow up at [facility] in 2-4 weeks for a minimum of one more visit to assist pt with progressing through stages of change/further nutrition education.

## 2023-04-22 ENCOUNTER — Other Ambulatory Visit (HOSPITAL_COMMUNITY): Payer: Self-pay

## 2023-04-29 ENCOUNTER — Ambulatory Visit (INDEPENDENT_AMBULATORY_CARE_PROVIDER_SITE_OTHER): Payer: Self-pay | Admitting: Licensed Clinical Social Worker

## 2023-04-29 DIAGNOSIS — F4322 Adjustment disorder with anxiety: Secondary | ICD-10-CM

## 2023-04-29 NOTE — Progress Notes (Signed)
 1
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
## 2023-05-12 ENCOUNTER — Other Ambulatory Visit (HOSPITAL_BASED_OUTPATIENT_CLINIC_OR_DEPARTMENT_OTHER): Payer: Self-pay

## 2023-05-12 ENCOUNTER — Ambulatory Visit: Admitting: Internal Medicine

## 2023-05-12 VITALS — BP 138/82 | HR 76 | Temp 98.2°F | Resp 14 | Ht 69.5 in | Wt 391.2 lb

## 2023-05-12 DIAGNOSIS — B349 Viral infection, unspecified: Secondary | ICD-10-CM | POA: Diagnosis not present

## 2023-05-12 MED ORDER — TIRZEPATIDE 7.5 MG/0.5ML ~~LOC~~ SOAJ
7.5000 mg | SUBCUTANEOUS | 0 refills | Status: DC
  Filled 2023-05-12: qty 4, 56d supply, fill #0
  Filled 2023-05-27: qty 2, 28d supply, fill #0

## 2023-05-12 MED ORDER — BENZONATATE 200 MG PO CAPS
200.0000 mg | ORAL_CAPSULE | Freq: Three times a day (TID) | ORAL | 0 refills | Status: DC | PRN
Start: 2023-05-12 — End: 2023-07-14
  Filled 2023-05-12: qty 21, 7d supply, fill #0

## 2023-05-12 NOTE — Assessment & Plan Note (Signed)
 Viral syndrome: Symptoms consistent with viral syndrome for 2 days, tested at home for COVID and influenza x 2: Negative. Plan: Recheck for COVID and flu tomorrow, and the next day if symptoms severe.  He will qualify for antivirals, last BMP okay. Rest, fluids, Mucinex, Tessalon Perles. Work note. DM: Tolerating Mounjaro 5 mg weekly well.  Will switch to Mounjaro 7.5 weekly in 2 weeks.  Prescription sent. RTC 2 months from now for a routine checkup

## 2023-05-12 NOTE — Progress Notes (Signed)
 Subjective:    Patient ID: Julian Washington, male    DOB: 04-25-1980, 43 y.o.   MRN: [id number]  DOS:  05/12/2023 Type of visit - description: Acute  Symptoms started 2 days ago: Aches and pains.  Cough, mostly dry.  Anterior chest hurting/burning with cough. Some sore throat and earache. No fevers but did have chills.  He recently flew back from [place]. Nobody else at home is sick. Has tested for COVID and influenza yesterday and today: Negative.  Wt Readings from Last 3 Encounters:  05/12/23 (!) 391 lb 3.2 oz (177.4 kg)  04/21/23 (!) 385 lb 11.2 oz (175 kg)  04/18/23 (!) 390 lb (176.9 kg)     Review of Systems See above   Past Medical History:  Diagnosis Date   ADHD    Anemia    Anxiety and depression    h/o suicidality w/ citalopram   Atrial fibrillation (HCC)    Diabetes mellitus without complication (HCC)    Difficult intubation    ETD (eustachian tube dysfunction)    s/p ENT, declined ear tuves before    GERD (gastroesophageal reflux disease)    History of chicken pox    Titered on 08/16/2005   Hypertension    Hypogonadism male    Dr Elvera Lennox, used to see urology   Infertility male    Internal hemorrhoids    s/p banding   Low testosterone    Migraine    on topamax   OSA on CPAP    Palpitations    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HCC)    h/o increased prolactin, Dr Elvera Lennox (previously @ [facility])   Prediabetes    A1C 6.2 years ago   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   Vitamin B 12 deficiency    h/o   Vitamin D deficiency     Past Surgical History:  Procedure Laterality Date   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-04-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, ACTH   TONSILLECTOMY AND ADENOIDECTOMY      Current Outpatient Medications  Medication Instructions   augmented betamethasone dipropionate (DIPROLENE-AF) 0.05 % cream SMARTSIG:1 sparingly Topical Twice Daily   Cholecalciferol (VITAMIN D3) 125 MCG (5000 UT) TABS 1 tablet, 3 times weekly   cyclobenzaprine (FLEXERIL)  10 mg, Oral, Daily at bedtime   Eliquis 5 mg, Oral, 2 times daily   EPINEPHrine (EPIPEN 2-PAK) 0.3 mg, Intramuscular, As needed   fexofenadine (ALLEGRA) 180 mg, Daily   flecainide (TAMBOCOR) 100 mg, Oral, Every 12 hours   fluticasone (FLONASE) 50 MCG/ACT nasal spray 2 sprays, Each Nare, Daily   losartan (COZAAR) 100 mg, Oral, Daily   Magnesium Oxide (MAG-OXIDE PO) 400 mg, Daily at bedtime   metFORMIN (GLUCOPHAGE-XR) 1,000 mg, Oral, Daily with breakfast   metoprolol succinate (TOPROL-XL) 50 MG 24 hr tablet Take 1 tablet (50 mg total) by mouth 2 (two) times daily with or immediately following a meal   Mounjaro 5 mg, Subcutaneous, Weekly   omeprazole (PRILOSEC) 40 mg, Oral, Daily before breakfast   risankizumab-rzaa (SKYRIZI PEN) 150 MG/ML pen Inject 1 pen injector subcutaneously every three months   rosuvastatin (CRESTOR) 10 mg, Oral, Daily at bedtime       Objective:   Physical Exam BP 138/82 (BP Location: Right Arm, Patient Position: Sitting, Cuff Size: Large)   Pulse 76   Temp 98.2 F (36.8 C) (Oral)   Resp 14   Ht 5' 9.5" (1.765 m)   Wt (!) 391 lb 3.2 oz (177.4 kg)  SpO2 96%   BMI 56.94 kg/m  General:   Well developed, NAD, BMI noted. HEENT:  Normocephalic . Face symmetric, atraumatic. TMs normal.  Throat symmetric, not red, no white patches. Lungs:  Very few rhonchi, they cleared with cough. Normal respiratory effort, no intercostal retractions, no accessory muscle use. Heart: RRR,  no murmur.  Lower extremities: no pretibial edema bilaterally  Skin: Not pale. Not jaundice Neurologic:  alert & oriented X3.  Speech normal, gait appropriate for age and unassisted Psych--  Cognition and judgment appear intact.  Cooperative with normal attention span and concentration.  Behavior appropriate. No anxious or depressed appearing.      Assessment     Assessment   DM Chronic venous insufficiency/lymphedema/stasis dermatitis PSYCH: --Anxiety depression (suicidality w/   Citalopram) --ADHD Dx 2019, Dr Elisabeth Most Morbid obesity Vitamin D and  B12 deficiency CV: New onset A. fib 11-2020.  Admitted  Calcium coronary score 0 (11-2020) Endocrinology:Dr Gherghe --Pituitary mass, surgery : Prolactinoma --Hypogonadism hypogonadotropic --Infertility Migraines, d/c Topamax 05-2015 (pt concerned about short term memory)  OSA on CPAP -- DR Craige Cotta Hematology -Mild chronic anemia, no previous colonoscopy or EGD. Iron , vitamins normal.  saw hematology 09/2017  -Splenomegaly: Per CT 09/20/2017, saw hematology --Abdominal lymphadenopathies: Felt to be stable, see hematology note from July 14, 2019. Pulmonary nodules: Felt to be stable per last CT. DERM: Psoriasis  H/o ET  dysfunction, declined ear tubes H/o hemorrhoids s/p  banding H/o abd pain 2012 (w/u CT, [place], a HIDA scan), similar sx 02-2015 H/o  difficulty intubation    PLAN: Viral syndrome: Symptoms consistent with viral syndrome for 2 days, tested at home for COVID and influenza x 2: Negative. Plan: Recheck for COVID and flu tomorrow, and the next day if symptoms severe.  He will qualify for antivirals, last BMP okay. Rest, fluids, Mucinex, Tessalon Perles. Work note. DM: Tolerating Mounjaro 5 mg weekly well.  Will switch to Mounjaro 7.5 weekly in 2 weeks.  Prescription sent. RTC 2 months from now for a routine checkup

## 2023-05-12 NOTE — Patient Instructions (Signed)
  Check yourself again for influenza and COVID tomorrow and if you are not feeling better and  the next day.  Notify us immediately if test came back positive.  Rest, fluids , tylenol  For cough:  Take Mucinex DM or Robitussin-DM OTC.  Follow the instructions in the box. You can also take benzonatate  For nasal congestion: -Use over-the-counter Flonase: 2 nasal sprays on each side of the nose in the morning until you feel better     Avoid decongestants such as  Pseudoephedrine or phenylephrine     Call if not gradually better over the next  10 days   Call anytime if the symptoms are severe, you have high fever, short of breath, chest pain

## 2023-05-13 ENCOUNTER — Other Ambulatory Visit (HOSPITAL_COMMUNITY): Payer: Self-pay

## 2023-05-13 ENCOUNTER — Other Ambulatory Visit: Payer: Self-pay

## 2023-05-13 NOTE — Progress Notes (Signed)
 Specialty Pharmacy Refill Coordination Note  Julian Washington is a 44 y.o. male contacted today regarding refills of specialty medication(s) Skyrizi.  Patient requested (Patient-Rptd) Pickup at [facility] at [address]   Delivery date: No data recorded  Verified address: No data recorded  Medication will be filled on 06/04/22. PPU on 06/05/23

## 2023-05-15 ENCOUNTER — Encounter (HOSPITAL_COMMUNITY): Payer: Self-pay | Admitting: Emergency Medicine

## 2023-05-15 ENCOUNTER — Ambulatory Visit (HOSPITAL_COMMUNITY): Admission: EM | Admit: 2023-05-15 | Discharge: 2023-05-15 | Disposition: A | Discharge status: Home / Self Care

## 2023-05-15 DIAGNOSIS — H60391 Other infective otitis externa, right ear: Secondary | ICD-10-CM | POA: Diagnosis not present

## 2023-05-15 DIAGNOSIS — J029 Acute pharyngitis, unspecified: Secondary | ICD-10-CM

## 2023-05-15 LAB — POCT RAPID STREP A (OFFICE): Rapid Strep A Screen: NEGATIVE

## 2023-05-15 MED ORDER — NEOMYCIN-POLYMYXIN-HC 3.5-10000-1 OT SUSP: 4.0000 [drp] | Freq: Three times a day (TID) | OTIC | 0 refills | Status: DC

## 2023-05-15 MED ORDER — PREDNISONE 20 MG PO TABS: 40.0000 mg | ORAL_TABLET | Freq: Every day | ORAL | 0 refills | Status: AC

## 2023-05-15 NOTE — Discharge Instructions (Addendum)
 1. Infective otitis externa of right ear (Primary) - neomycin-polymyxin-hydrocortisone (CORTISPORIN) 3.5-10000-1 OTIC suspension; Place 4 drops into the right ear 3 (three) times daily  2. Acute pharyngitis, unspecified etiology - POC rapid strep A completed in UC is negative for strep pharyngitis --Continue to monitor symptoms for any change in severity if there is any escalation of current symptoms or development of new symptoms follow-up in ER for further evaluation and management.

## 2023-05-15 NOTE — ED Provider Notes (Signed)
 [facility]  Note:  This document was prepared using Dragon voice recognition software and may include unintentional dictation errors.  MRN: [id number] DOB: 07-16-1980  Subjective:   Julian Washington is a 43 y.o. male presenting for cough, severe fatigue, congestion, bilateral ear pressure, chest congestion since Saturday.  Patient reports that he took 4 COVID flu test at home which were all negative.  Patient was seen by his primary care provider who prescribed him Tessalon Perles and Mucinex but medications have not been effective.  No known sick contacts.  Denies chest pain, shortness of breath, weakness, dizziness.  No current facility-administered medications for this encounter.  Current Outpatient Medications:    neomycin-polymyxin-hydrocortisone (CORTISPORIN) 3.5-10000-1 OTIC suspension, Place 4 drops into the right ear 3 (three) times daily., Disp: 10 mL, Rfl: 0   apixaban (ELIQUIS) 5 MG TABS tablet, Take 1 tablet (5 mg total) by mouth 2 (two) times daily., Disp: 180 tablet, Rfl: 2   augmented betamethasone dipropionate (DIPROLENE-AF) 0.05 % cream, SMARTSIG:1 sparingly Topical Twice Daily, Disp: , Rfl:    benzonatate (TESSALON) 200 MG capsule, Take 1 capsule (200 mg total) by mouth 3 (three) times daily as needed for cough., Disp: 21 capsule, Rfl: 0   Cholecalciferol (VITAMIN D3) 125 MCG (5000 UT) TABS, Take 1 tablet by mouth 3 (three) times a week. Take M, W, F, Disp: , Rfl:    cyclobenzaprine (FLEXERIL) 10 MG tablet, Take 1 tablet (10 mg total) by mouth at bedtime., Disp: 90 tablet, Rfl: 3   EPINEPHrine (EPIPEN 2-PAK) 0.3 mg/0.3 mL IJ SOAJ injection, Inject 0.3 mg into the muscle as needed., Disp: 1 each, Rfl: 1   fexofenadine (ALLEGRA) 180 MG tablet, Take 180 mg by mouth daily., Disp: , Rfl:    flecainide (TAMBOCOR) 100 MG tablet, Take 1 tablet (100 mg total) by mouth every 12 (twelve) hours., Disp: 180 tablet, Rfl: 3   fluticasone (FLONASE) 50 MCG/ACT nasal spray, Place  2 sprays into both nostrils daily., Disp: 16 g, Rfl: 6   losartan (COZAAR) 100 MG tablet, Take 1 tablet (100 mg total) by mouth daily., Disp: 90 tablet, Rfl: 3   Magnesium Oxide (MAG-OXIDE PO), Take 400 mg by mouth at bedtime., Disp: , Rfl:    metFORMIN (GLUCOPHAGE-XR) 500 MG 24 hr tablet, Take 2 tablets (1,000 mg total) by mouth daily with breakfast., Disp: 180 tablet, Rfl: 1   metoprolol succinate (TOPROL-XL) 50 MG 24 hr tablet, Take 1 tablet (50 mg total) by mouth 2 (two) times daily with or immediately following a meal, Disp: 180 tablet, Rfl: 3   omeprazole (PRILOSEC) 40 MG capsule, Take 1 capsule (40 mg total) by mouth daily before breakfast., Disp: 90 capsule, Rfl: 2   risankizumab-rzaa (SKYRIZI PEN) 150 MG/ML pen, Inject 1 pen injector subcutaneously every three months, Disp: 1 mL, Rfl: 3   rosuvastatin (CRESTOR) 10 MG tablet, Take 1 tablet (10 mg total) by mouth at bedtime., Disp: 90 tablet, Rfl: 1   tirzepatide (MOUNJARO) 7.5 MG/0.5ML Pen, Inject 7.5 mg into the skin once a week., Disp: 4 mL, Rfl: 0   Allergies  Allergen Reactions   Influenza Virus Vaccine Anaphylaxis   Levaquin [Levofloxacin In D5w] Swelling   Celexa [Citalopram] Other (See Comments)    Mental status changes    Dextrans Other (See Comments)    Makes the pt. Drowsy.    Hydrocodone-Acetaminophen     REACTION: itching   Latex    Oxycodone Itching   Shiitake Mushroom (Obsolete) Rash  Past Medical History:  Diagnosis Date   ADHD    Anemia    Anxiety and depression    h/o suicidality w/ citalopram   Atrial fibrillation (HCC)    Diabetes mellitus without complication (HCC)    Difficult intubation    ETD (eustachian tube dysfunction)    s/p ENT, declined ear tuves before    GERD (gastroesophageal reflux disease)    History of chicken pox    Titered on 08/16/2005   Hypertension    Hypogonadism male    Dr Elvera Lennox, used to see urology   Infertility male    Internal hemorrhoids    s/p banding   Low  testosterone    Migraine    on topamax   OSA on CPAP    Palpitations    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HCC)    h/o increased prolactin, Dr Elvera Lennox (previously @ [facility])   Prediabetes    A1C 6.2 years ago   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   Vitamin B 12 deficiency    h/o   Vitamin D deficiency      Past Surgical History:  Procedure Laterality Date   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-04-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, ACTH   TONSILLECTOMY AND ADENOIDECTOMY      Family History  Problem Relation Age of Onset   Obesity Mother    Depression Father    Anxiety disorder Father    Bipolar disorder Father    Drug abuse Father    Cancer Maternal Grandfather        GBM    Diabetes Paternal Grandmother    Hypertension Paternal Grandmother    Diabetes Paternal Grandfather    Prostate cancer Paternal Grandfather    CAD Other        PGF   Colon cancer Neg Hx     Social History   Tobacco Use   Smoking status: Never   Smokeless tobacco: Never  Vaping Use   Vaping status: Never Used  Substance Use Topics   Alcohol use: Not Currently    Comment: none x 10 yrs   Drug use: No    ROS Refer to HPI for ROS details.  Objective:   Vitals: BP 138/79 (BP Location: Right Arm)   Pulse 78   Temp 98 F (36.7 C) (Oral)   Resp 18   SpO2 98%   Physical Exam Vitals and nursing note reviewed.  Constitutional:      General: He is not in acute distress.    Appearance: Normal appearance. He is well-developed. He is not ill-appearing or toxic-appearing.  HENT:     Head: Normocephalic.     Right Ear: External ear normal. No drainage, swelling or tenderness. A middle ear effusion is present. Tympanic membrane is scarred. Tympanic membrane is not injected, perforated, erythematous or bulging.     Left Ear: External ear normal. No drainage, swelling or tenderness. A middle ear effusion is present. Tympanic membrane is scarred. Tympanic membrane is not injected, perforated, erythematous or bulging.      Nose: Congestion present. No rhinorrhea.     Mouth/Throat:     Mouth: Mucous membranes are moist.     Pharynx: Oropharynx is clear. Posterior oropharyngeal erythema present. No oropharyngeal exudate.  Cardiovascular:     Rate and Rhythm: Normal rate and regular rhythm.     Heart sounds: No murmur heard. Pulmonary:     Effort: Pulmonary effort is normal. No respiratory distress.     Breath sounds:  Normal breath sounds. No stridor. No wheezing, rhonchi or rales.  Abdominal:     Palpations: Abdomen is soft.     Tenderness: There is no abdominal tenderness.  Musculoskeletal:        General: No swelling.  Skin:    General: Skin is warm and dry.     Capillary Refill: Capillary refill takes less than 2 seconds.  Neurological:     Mental Status: He is alert.  Psychiatric:        Mood and Affect: Mood normal.     Procedures  Results for orders placed or performed during the hospital encounter of 05/15/23 (from the past 24 hours)  POC rapid strep A     Status: None   Collection Time: 05/15/23  6:30 PM  Result Value Ref Range   Rapid Strep A Screen Negative Negative    Assessment and Plan :   PDMP not reviewed this encounter.  1. Infective otitis externa of right ear   2. Acute pharyngitis, unspecified etiology    1. Infective otitis externa of right ear (Primary) - neomycin-polymyxin-hydrocortisone (CORTISPORIN) 3.5-10000-1 OTIC suspension; Place 4 drops into the right ear 3 (three) times daily  2. Acute pharyngitis, unspecified etiology - POC rapid strep A completed in UC is negative for strep pharyngitis -Continue to monitor symptoms for any change in severity if there is any escalation of current symptoms or development of new symptoms follow-up in ER for further evaluation and management.  Lucky Cowboy   [place], [place], [place] 05/15/23 1839

## 2023-05-15 NOTE — ED Triage Notes (Signed)
 Saturday had cough and "felt like got hit by mac truck". Saw PCP Monday-told to take Tessalon and Mucinex. Reports ear pain has gotten worse. Took 4 flu/covid test that have all been negative.

## 2023-05-20 ENCOUNTER — Other Ambulatory Visit (HOSPITAL_COMMUNITY): Payer: Self-pay

## 2023-05-20 ENCOUNTER — Other Ambulatory Visit: Payer: Self-pay

## 2023-05-20 DIAGNOSIS — L858 Other specified epidermal thickening: Secondary | ICD-10-CM | POA: Diagnosis not present

## 2023-05-20 DIAGNOSIS — I8311 Varicose veins of right lower extremity with inflammation: Secondary | ICD-10-CM | POA: Diagnosis not present

## 2023-05-20 DIAGNOSIS — Z79899 Other long term (current) drug therapy: Secondary | ICD-10-CM | POA: Diagnosis not present

## 2023-05-20 DIAGNOSIS — L400 Psoriasis vulgaris: Secondary | ICD-10-CM | POA: Diagnosis not present

## 2023-05-20 DIAGNOSIS — I8312 Varicose veins of left lower extremity with inflammation: Secondary | ICD-10-CM | POA: Diagnosis not present

## 2023-05-20 DIAGNOSIS — I872 Venous insufficiency (chronic) (peripheral): Secondary | ICD-10-CM | POA: Diagnosis not present

## 2023-05-20 MED ORDER — TRIAMCINOLONE ACETONIDE 0.1 % EX CREA
TOPICAL_CREAM | CUTANEOUS | 0 refills | Status: DC
Start: 2023-05-20 — End: 2023-12-10
  Filled 2023-05-20: qty 80, 30d supply, fill #0

## 2023-05-20 MED ORDER — BETAMETHASONE DIPROPIONATE AUG 0.05 % EX CREA
TOPICAL_CREAM | CUTANEOUS | 1 refills | Status: DC
  Filled 2023-05-20: qty 50, 30d supply, fill #0

## 2023-05-20 MED FILL — Omeprazole Cap Delayed Release 40 MG: ORAL | 90 days supply | Qty: 90 | Fill #2 | Status: AC

## 2023-05-27 ENCOUNTER — Telehealth: Payer: Self-pay

## 2023-05-27 ENCOUNTER — Other Ambulatory Visit: Payer: Self-pay

## 2023-05-27 ENCOUNTER — Other Ambulatory Visit (HOSPITAL_COMMUNITY): Payer: Self-pay

## 2023-05-27 ENCOUNTER — Telehealth: Payer: Self-pay | Admitting: Cardiology

## 2023-05-27 NOTE — Telephone Encounter (Signed)
 Patient telephone appt scheduled and consent done     Patient Consent for Virtual Visit         Julian Washington has provided verbal consent on 05/27/2023 for a virtual visit (video or telephone).   CONSENT FOR VIRTUAL VISIT FOR:  Julian Washington  By participating in this virtual visit I agree to the following:  I hereby voluntarily request, consent and authorize [facility] and its employed or contracted physicians, physician assistants, nurse practitioners or other licensed health care professionals (the Practitioner), to provide me with telemedicine health care services (the "Services") as deemed necessary by the treating Practitioner. I acknowledge and consent to receive the Services by the Practitioner via telemedicine. I understand that the telemedicine visit will involve communicating with the Practitioner through live audiovisual communication technology and the disclosure of certain medical information by electronic transmission. I acknowledge that I have been given the opportunity to request an in-person assessment or other available alternative prior to the telemedicine visit and am voluntarily participating in the telemedicine visit.  I understand that I have the right to withhold or withdraw my consent to the use of telemedicine in the course of my care at any time, without affecting my right to future care or treatment, and that the Practitioner or I may terminate the telemedicine visit at any time. I understand that I have the right to inspect all information obtained and/or recorded in the course of the telemedicine visit and may receive copies of available information for a reasonable fee.  I understand that some of the potential risks of receiving the Services via telemedicine include:  Delay or interruption in medical evaluation due to technological equipment failure or disruption; Information transmitted may not be sufficient (e.g. poor resolution of images) to allow for  appropriate medical decision making by the Practitioner; and/or  In rare instances, security protocols could fail, causing a breach of personal health information.  Furthermore, I acknowledge that it is my responsibility to provide information about my medical history, conditions and care that is complete and accurate to the best of my ability. I acknowledge that Practitioner's advice, recommendations, and/or decision may be based on factors not within their control, such as incomplete or inaccurate data provided by me or distortions of diagnostic images or specimens that may result from electronic transmissions. I understand that the practice of medicine is not an exact science and that Practitioner makes no warranties or guarantees regarding treatment outcomes. I acknowledge that a copy of this consent can be made available to me via my patient portal [facility] MyChart), or I can request a printed copy by calling the office of [facility].    I understand that my insurance will be billed for this visit.   I have read or had this consent read to me. I understand the contents of this consent, which adequately explains the benefits and risks of the Services being provided via telemedicine.  I have been provided ample opportunity to ask questions regarding this consent and the Services and have had my questions answered to my satisfaction. I give my informed consent for the services to be provided through the use of telemedicine in my medical care

## 2023-05-27 NOTE — Telephone Encounter (Signed)
 Called patient rescheduled patient for a telephone appointment for preop clearance originally preop APP suggested a phone call appt but at the time patient wanted to come in sooner patient has been rescheduled for tele this week

## 2023-05-27 NOTE — Telephone Encounter (Signed)
 See previous pre-op clearance encounter. Patient is following up. He would like to know if his appointment can be converted to virtual and moved up for a sooner date. Please advise.

## 2023-05-30 ENCOUNTER — Ambulatory Visit: Attending: Nurse Practitioner

## 2023-05-30 DIAGNOSIS — Z01810 Encounter for preprocedural cardiovascular examination: Secondary | ICD-10-CM | POA: Diagnosis not present

## 2023-05-30 NOTE — Progress Notes (Signed)
 Virtual Visit via Telephone Note   Because of Julian Washington co-morbid illnesses, he is at least at moderate risk for complications without adequate follow up.  This format is felt to be most appropriate for this patient at this time.  Due to technical limitations with video connection (technology), today's appointment will be conducted as an audio only telehealth visit, and Julian Washington verbally agreed to proceed in this manner.   All issues noted in this document were discussed and addressed.  No physical exam could be performed with this format.  Evaluation Performed:  Preoperative cardiovascular risk assessment _____________   Date:  05/30/2023   Patient ID:  Julian Washington, DOB 1980-12-03, MRN [id number] Patient Location:  Home Provider location:   Office  Primary Care Provider:  Ezell Hollow, MD Primary Cardiologist:  Boyce Byes, MD  Chief Complaint / Patient Profile   43 y.o. y/o male with a h/o atrial fibrillation, OSA (on CPAP), DM type II, HTN, obesity who is pending for laparoscopic Roux-en-Y and presents today for telephonic preoperative cardiovascular risk assessment.  History of Present Illness    Julian Washington is a 43 y.o. male who presents via audio/video conferencing for a telehealth visit today.  Pt was last seen in cardiology clinic on 12//24 by Dr. Marven Slimmer.  At that time Julian Washington was doing well presented with migraines and high blood pressure that began after initiating testosterone  therapy.  He discontinued testosterone  and migraines resolved.  Losartan  was increased to 100 mg daily and patient advised to monitor BP.  The patient is now pending procedure as outlined above. Since his last visit, he reports doing well with no new cardiac complaints.  He does not check his blood pressure daily but his most recent readings were within normal range at provider office checkups.  He reports experiencing some PVCs while taking prednisone  40 mg that resolved after  reducing the medication dose and discontinuing.  He is able to complete greater than 4 METS of activity without any difficulty.  He denies chest pain, shortness of breath, lower extremity edema, fatigue, palpitations, melena, hematuria, hemoptysis, diaphoresis, weakness, presyncope, syncope, orthopnea, and PND.    Past Medical History    Past Medical History:  Diagnosis Date   ADHD    Anemia    Anxiety and depression    h/o suicidality w/ citalopram   Atrial fibrillation (HCC)    Diabetes mellitus without complication (HCC)    Difficult intubation    ETD (eustachian tube dysfunction)    s/p ENT, declined ear tuves before    GERD (gastroesophageal reflux disease)    History of chicken pox    Titered on 08/16/2005   Hypertension    Hypogonadism male    Dr Aldona Amel, used to see urology   Infertility male    Internal hemorrhoids    s/p banding   Low testosterone     Migraine    on topamax    OSA on CPAP    Palpitations    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HCC)    h/o increased prolactin, Dr Aldona Amel (previously @ [facility])   Prediabetes    A1C 6.2 years ago   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 Drowsy.    Hydrocodone-Acetaminophen      REACTION: itching   Latex    Oxycodone Itching   Shiitake Mushroom (Obsolete) Rash    Home Medications    Prior to Admission medications   Medication Sig Start Date End Date Taking? Authorizing Provider  apixaban  (ELIQUIS ) 5 MG TABS tablet Take 1 tablet (5 mg total) by mouth 2 (two) times daily.  04/15/23   Boyce Byes, MD  augmented betamethasone  dipropionate (DIPROLENE -AF) 0.05 % cream SMARTSIG:1 sparingly Topical Twice Daily 11/27/21   [provider]  augmented betamethasone  dipropionate (DIPROLENE -AF) 0.05 % cream Apply a small amount to affected area twice a day 05/20/23     benzonatate  (TESSALON ) 200 MG capsule Take 1 capsule (200 mg total) by mouth 3 (three) times daily as needed for cough. 05/12/23   Paz, Jose E, MD  Cholecalciferol (VITAMIN D3) 125 MCG (5000 UT) TABS Take 1 tablet by mouth 3 (three) times a week. Take M, W, F    [provider]  cyclobenzaprine  (FLEXERIL ) 10 MG tablet Take 1 tablet (10 mg total) by mouth at bedtime. 01/28/23   Dohmeier, Raoul Byes, MD  EPINEPHrine  (EPIPEN  2-PAK) 0.3 mg/0.3 mL IJ SOAJ injection Inject 0.3 mg into the muscle as needed. 01/21/22   Orelia Binet, MD  fexofenadine (ALLEGRA) 180 MG tablet Take 180 mg by mouth daily. 10/06/14   [provider]  flecainide  (TAMBOCOR ) 100 MG tablet Take 1 tablet (100 mg total) by mouth every 12 (twelve) hours. 04/01/23   Boyce Byes, MD  fluticasone  (FLONASE ) 50 MCG/ACT nasal spray Place 2 sprays into both nostrils daily. 02/23/22   Mardene Shake, FNP  losartan  (COZAAR ) 100 MG tablet Take 1 tablet (100 mg total) by mouth daily. 01/15/23   Boyce Byes, MD  Magnesium  Oxide (MAG-OXIDE PO) Take 400 mg by mouth at bedtime.    [provider]  metFORMIN  (GLUCOPHAGE -XR) 500 MG 24 hr tablet Take 2 tablets (1,000 mg total) by mouth daily with breakfast. 03/27/23   Paz, Jose E, MD  metoprolol  succinate (TOPROL -XL) 50 MG 24 hr tablet Take 1 tablet (50 mg total) by mouth 2 (two) times daily with or immediately following a meal 04/01/23   Boyce Byes, MD  neomycin -polymyxin-hydrocortisone (CORTISPORIN) 3.5-10000-1 OTIC suspension Place 4 drops into the right ear 3 (three) times daily. 05/15/23   Reddick, Johnathan B, NP  omeprazole  (PRILOSEC) 40 MG capsule Take 1 capsule  (40 mg total) by mouth daily before breakfast. 11/18/22   Webb, Padonda B, FNP  risankizumab -rzaa (SKYRIZI  PEN) 150 MG/ML pen Inject 1 pen injector subcutaneously every three months 02/18/23   Jegede, Olugbemiga E, MD  rosuvastatin  (CRESTOR ) 10 MG tablet Take 1 tablet (10 mg total) by mouth at bedtime. 03/27/23   Paz, Jose E, MD  tirzepatide  (MOUNJARO ) 7.5 MG/0.5ML Pen Inject 7.5 mg into the skin once a week. 05/12/23   Paz, Jose E, MD  triamcinolone  cream (KENALOG ) 0.1 % Apply a small amount to skin on lower legs twice daily. 05/20/23       Physical Exam    Vital Signs:  AVYN COATE does not have vital signs available for review today.  Given telephonic nature of communication, physical exam is limited. AAOx3. NAD.  Normal affect.  Speech and respirations are unlabored.  Accessory Clinical Findings    None  Assessment & Plan    1.  Preoperative Cardiovascular Risk Assessment: - Patient's RCRI score is 0.9%  The patient affirms he has been doing well without any new cardiac symptoms. They are able to achieve 7 METS without cardiac limitations. Therefore, based on ACC/AHA guidelines, the patient would be at acceptable risk for the planned procedure without further cardiovascular testing. The patient was advised that if he develops new symptoms prior to surgery to contact our office to arrange for a follow-up visit, and he verbalized understanding.   The patient was advised that if he develops new symptoms prior to surgery to contact our office to arrange for a follow-up visit, and he verbalized understanding.  Per office protocol, patient can hold Eliquis  for 2 days prior to procedure.   A copy of this note will be routed to requesting surgeon.  Time:   Today, I have spent 6 minutes with the patient with telehealth technology discussing medical history, symptoms, and management plan.     Francene Ing, Retha Cast, NP  05/30/2023, 7:24 AM

## 2023-06-02 ENCOUNTER — Encounter: Attending: General Surgery | Admitting: Dietician

## 2023-06-02 ENCOUNTER — Encounter: Payer: Self-pay | Admitting: Dietician

## 2023-06-02 VITALS — Ht 69.5 in | Wt 395.9 lb

## 2023-06-02 DIAGNOSIS — E669 Obesity, unspecified: Secondary | ICD-10-CM | POA: Insufficient documentation

## 2023-06-02 DIAGNOSIS — E119 Type 2 diabetes mellitus without complications: Secondary | ICD-10-CM | POA: Diagnosis not present

## 2023-06-02 NOTE — Progress Notes (Signed)
 Supervised Weight Loss Visit Bariatric Nutrition Education Appt Start Time: 1716    End Time: 1741  Planned surgery: RYGB Pt expectation of surgery: weight loss for getting rid of venous stasis, and getting off medications  Referral stated Supervised Weight Loss (SWL) visits needed: 0  Pt completed visits.   Pt has cleared nutrition requirements.   NUTRITION ASSESSMENT   Anthropometrics  Start weight at [facility]: 385.7 lbs (date: 04/21/2023)  Height: 69.5 in Weight today: 395.9 lbs BMI:  kg/m2    Clinical   Pharmacotherapy: History of weight loss medication used: ozempic , Mounjaro   Medical hx: HTN, hypercholesterolemia, obesity, A-fib RVR, sleep apnea, T2DM Medications: Mounjaro , rosuvastatin , skyrizi , omeprazole , metformin , magnesium  oxide, losartan , flonase , flecainide , flexeril  (as needed), eliquis , metopranole, adding Vit D Labs: vit D 25.7; A1c 5.9 Notable signs/symptoms: none noted Any previous deficiencies? No  Lifestyle & Dietary Hx  Pt states he is aiming for 64 oz of fluids, stating he is getting close to 50 oz per day. Stating he  Pt states he has been walking his dog. Pt states he has been reading handouts and discussing with his wife about behavioral changes. Pt states he has been outside more to increase vit D from sunshine. Pt states he had a long conversation with his wife, sharing the information received in the first meeting. Pt states his wife is planning to come to the pre-op class and post-op class. Pt states he thinks the Mounjaro  is starting to kick in, stating he does not have as much of an appetite. Pt took notes during visit, writing down behaviors to practice now, before surgery.  Estimated daily fluid intake: 50 oz Supplements: vit D 5,000 international units 2 days per week. Current average weekly physical activity: walking the dog 3 days per week.  24-Hr Dietary Recall First Meal: gravy biscuit and sausage, or french toast sausage and applesauce  milk Snack:  Second Meal: curry chicken rice bowl, rice crispy treat Snack:  Third Meal: [place] chicken breast, vegetable, mac and cheese or salmon with brussel sprout, roasted potatoes Snack:  Beverages: decaf coffee, decaf tea with stevia, vitamin water zero, zero sugar juice  Alcoholic beverages per week: 0  NUTRITION DIAGNOSIS  Overweight/obesity ([place]-3.3) related to past poor dietary habits and physical inactivity as evidenced by patient w/ planned RYGB surgery following dietary guidelines for continued weight loss.  NUTRITION INTERVENTION  Nutrition counseling (C-1) and education (E-2) to facilitate bariatric surgery goals..  Encouraged patient to honor their body's internal hunger and fullness cues.  Throughout the day, check in mentally and rate hunger. Stop eating when satisfied not full regardless of how much food is left on the plate.  Get more if still hungry 20-30 minutes later.  The key is to honor satisfaction so throughout the meal, rate fullness factor and stop when comfortably satisfied not physically full. The key is to honor hunger and fullness without any feelings of guilt or shame.  Pay attention to what the internal cues are, rather than any external factors. This will enhance the confidence you have in listening to your own body and following those internal cues enabling you to increase how often you eat when you are hungry not out of appetite and stop when you are satisfied not full.  Encouraged pt to continue to drink a minium 64 fluid ounces with half being plain water to satisfy proper hydration   Protein is crucial both pre- and post-surgery, playing a vital role in healing, muscle preservation, and long-term weight management.  It aids in building and repairing tissues, including skin, muscle, and organs, crucial for recovery from surgery. Protein also helps prevent muscle loss, which can occur during periods of rapid weight loss, and contributes to feeling fuller and  more satisfied, aiding in adherence to a post-surgery diet.  Separating fluids from solid food intake is crucial after surgery because it optimizes stomach capacity for protein and nutrient absorption and reduces the risk of nausea, vomiting, and dumping syndrome. The smaller stomach capacity after surgery requires mindful fluid intake to avoid overfilling and impacting nutrient absorption. Practicing now helps to be more successful after surgery.  Pre-Op Goals Reviewed with the Patient  Pre-Op Goals Progress & New Goals New: separate fluids from food; wait 15 minute before eating and wait for 30 minutes after. Continue: aim for 64 oz of fluids. New: get rid distractions during meals and snacks; listen to your body and aim satisfaction before fullness. Future goal: track protein; aim for 80 grams per day; aim for a protein with every meal or snack, even when not hungry. Continue: increasing physical activity; walk the dog 30 minutes 3 days per week  Handouts Provided Include    Learning Style & Readiness for Change Teaching method utilized: Visual & Auditory  Demonstrated degree of understanding via: Teach Back  Readiness Level: preparation Barriers to learning/adherence to lifestyle change: nothing identified  RD's Notes for next Visit    MONITORING & EVALUATION Dietary intake, weekly physical activity, body weight, and pre-op goals in 1 month.   Next Steps  Pt has completed visits. No further supervised visits required/recommended. Patient is to return to [facility] for pre-op class >2 weeks prior to scheduled surgery.

## 2023-06-04 ENCOUNTER — Other Ambulatory Visit: Payer: Self-pay

## 2023-06-15 ENCOUNTER — Encounter: Payer: Self-pay | Admitting: Internal Medicine

## 2023-06-16 ENCOUNTER — Other Ambulatory Visit (HOSPITAL_COMMUNITY): Payer: Self-pay

## 2023-06-16 MED ORDER — TIRZEPATIDE 10 MG/0.5ML ~~LOC~~ SOAJ
10.0000 mg | SUBCUTANEOUS | 0 refills | Status: DC
  Filled 2023-06-16 – 2023-06-19 (×2): qty 2, 28d supply, fill #0

## 2023-06-16 NOTE — Telephone Encounter (Signed)
 Send Mounjaro  10 mg 1 month supply

## 2023-06-16 NOTE — Addendum Note (Signed)
 Addended by: Jordyne Poehlman D on: 06/16/2023 09:39 AM   Modules accepted: Orders

## 2023-06-17 ENCOUNTER — Other Ambulatory Visit (HOSPITAL_COMMUNITY): Payer: Self-pay

## 2023-06-19 ENCOUNTER — Other Ambulatory Visit: Payer: Self-pay

## 2023-06-30 ENCOUNTER — Other Ambulatory Visit: Payer: Self-pay

## 2023-06-30 ENCOUNTER — Other Ambulatory Visit (HOSPITAL_COMMUNITY): Payer: Self-pay

## 2023-07-01 ENCOUNTER — Other Ambulatory Visit: Payer: Self-pay

## 2023-07-03 DIAGNOSIS — G4733 Obstructive sleep apnea (adult) (pediatric): Secondary | ICD-10-CM | POA: Diagnosis not present

## 2023-07-14 ENCOUNTER — Ambulatory Visit: Admitting: Internal Medicine

## 2023-07-14 ENCOUNTER — Other Ambulatory Visit (HOSPITAL_BASED_OUTPATIENT_CLINIC_OR_DEPARTMENT_OTHER): Payer: Self-pay

## 2023-07-14 ENCOUNTER — Other Ambulatory Visit (HOSPITAL_COMMUNITY): Payer: Self-pay

## 2023-07-14 VITALS — BP 126/82 | HR 72 | Temp 98.3°F | Resp 16 | Ht 69.5 in | Wt 395.0 lb

## 2023-07-14 DIAGNOSIS — Z7984 Long term (current) use of oral hypoglycemic drugs: Secondary | ICD-10-CM

## 2023-07-14 DIAGNOSIS — Z7985 Long-term (current) use of injectable non-insulin antidiabetic drugs: Secondary | ICD-10-CM | POA: Diagnosis not present

## 2023-07-14 DIAGNOSIS — E559 Vitamin D deficiency, unspecified: Secondary | ICD-10-CM | POA: Diagnosis not present

## 2023-07-14 DIAGNOSIS — E119 Type 2 diabetes mellitus without complications: Secondary | ICD-10-CM

## 2023-07-14 DIAGNOSIS — I480 Paroxysmal atrial fibrillation: Secondary | ICD-10-CM

## 2023-07-14 LAB — COMPREHENSIVE METABOLIC PANEL WITH GFR
ALT: 14 U/L (ref 0–53)
AST: 16 U/L (ref 0–37)
Albumin: 4.4 g/dL (ref 3.5–5.2)
Alkaline Phosphatase: 65 U/L (ref 39–117)
BUN: 16 mg/dL (ref 6–23)
CO2: 22 meq/L (ref 19–32)
Calcium: 9.3 mg/dL (ref 8.4–10.5)
Chloride: 106 meq/L (ref 96–112)
Creatinine, Ser: 0.76 mg/dL (ref 0.40–1.50)
GFR: 111.04 mL/min (ref >60.00)
Glucose, Bld: 97 mg/dL (ref 70–99)
Potassium: 4.6 meq/L (ref 3.5–5.1)
Sodium: 139 meq/L (ref 135–145)
Total Bilirubin: 0.5 mg/dL (ref 0.2–1.2)
Total Protein: 6.5 g/dL (ref 6.0–8.3)

## 2023-07-14 LAB — MICROALBUMIN / CREATININE URINE RATIO
Creatinine,U: 84.6 mg/dL
Microalb Creat Ratio: 91.7 mg/g — ABNORMAL HIGH (ref 0.0–30.0)
Microalb, Ur: 7.8 mg/dL — ABNORMAL HIGH (ref 0.0–1.9)

## 2023-07-14 LAB — HEMOGLOBIN A1C: Hgb A1c MFr Bld: 6.2 % (ref 4.6–6.5)

## 2023-07-14 LAB — VITAMIN D 25 HYDROXY (VIT D DEFICIENCY, FRACTURES): VITD: 24.26 ng/mL — ABNORMAL LOW (ref 30.00–100.00)

## 2023-07-14 MED ORDER — TIRZEPATIDE 12.5 MG/0.5ML ~~LOC~~ SOAJ
12.5000 mg | SUBCUTANEOUS | 1 refills | Status: DC
  Filled 2023-07-14: qty 2, 28d supply, fill #0
  Filled 2023-08-14: qty 2, 28d supply, fill #1

## 2023-07-14 NOTE — Patient Instructions (Signed)
 When you are ready, increase Mounjaro  to 12.5 mg weekly.  Continue watching your diet closely.    GO TO THE LAB :  Get the blood work   Your results will be posted on MyChart with my comments  Next office visit for a physical exam in 4 months Please make an appointment before you leave today

## 2023-07-14 NOTE — Assessment & Plan Note (Signed)
 DM: Currently on metformin  and Mounjaro  10 mg weekly, no ambulatory CBGs.  Checking A1c and micro. Feet exam negative. Morbid obesity: Feels like Mounjaro  is helping to some degree, would like to bump up the dose to 12.5 mg, will do.  Also, planning to have bariatric surgery October 2025 Vitamin B12: On no supplements.  Last level okay. Vitamin D  deficiency: On 5000 units every Friday, checking levels. Atrial fibrillation: Anticoagulated without apparent problems.  Essentially no symptoms.  To see cardiology next month. Preventive care: Declined PNM 20. RTC for bone CPX

## 2023-07-14 NOTE — Progress Notes (Signed)
 Subjective:    Patient ID: Julian [place], male    DOB: [birth year], 43 y.o.   MRN: [id number]  DOS:  07/14/2023 Type of visit - description: Routine checkup   Chronic medical problems addressed. Good med compliance. Denies chest pain or difficulty breathing.  Has occasionally feel palpitations without other issues such as syncope or near syncope. He is anticoagulated.  Denies nausea vomiting.  No blood in the stools or blood in the urine.  Wt Readings from Last 3 Encounters:  07/14/23 (!) 395 lb (179.2 kg)  06/02/23 (!) 395 lb 14.4 oz (179.6 kg)  05/12/23 (!) 391 lb 3.2 oz (177.4 kg)   Review of Systems See above   Past Medical History:  Diagnosis Date   ADHD    Anemia    Anxiety and depression    h/o suicidality w/ citalopram   Atrial fibrillation (HCC)    Diabetes mellitus without complication (HCC)    Difficult intubation    ETD (eustachian tube dysfunction)    s/p ENT, declined ear tuves before    GERD (gastroesophageal reflux disease)    History of chicken pox    Titered on 08/16/2005   Hypertension    Hypogonadism male    Dr Aldona Amel, used to see urology   Infertility male    Internal hemorrhoids    s/p banding   Low testosterone     Migraine    on topamax    OSA on CPAP    Palpitations    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HCC)    h/o increased prolactin, Dr Aldona Amel (previously @ [facility])   Prediabetes    A1C 6.2 years ago   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   Vitamin B 12 deficiency    h/o   Vitamin D  deficiency     Past Surgical History:  Procedure Laterality Date   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-04-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, ACTH    TONSILLECTOMY AND ADENOIDECTOMY      Current Outpatient Medications  Medication Instructions   augmented betamethasone  dipropionate (DIPROLENE -AF) 0.05 % cream Apply a small amount to affected area twice a day   Cholecalciferol (VITAMIN D3) 125 MCG (5000 UT) TABS 1 tablet, 3 times weekly   cyclobenzaprine  (FLEXERIL ) 10 mg, Oral,  Daily at bedtime   Eliquis  5 mg, Oral, 2 times daily   EPINEPHrine  (EPIPEN  2-PAK) 0.3 mg, Intramuscular, As needed   fexofenadine (ALLEGRA) 180 mg, Daily   flecainide  (TAMBOCOR ) 100 mg, Oral, Every 12 hours   fluticasone  (FLONASE ) 50 MCG/ACT nasal spray 2 sprays, Each Nare, Daily   losartan  (COZAAR ) 100 mg, Oral, Daily   Magnesium  Oxide (MAG-OXIDE PO) 400 mg, Daily at bedtime   metFORMIN  (GLUCOPHAGE -XR) 1,000 mg, Oral, Daily with breakfast   metoprolol  succinate (TOPROL -XL) 50 MG 24 hr tablet Take 1 tablet (50 mg total) by mouth 2 (two) times daily with or immediately following a meal   omeprazole  (PRILOSEC) 40 mg, Oral, Daily before breakfast   risankizumab -rzaa (SKYRIZI  PEN) 150 MG/ML pen Inject 1 pen injector subcutaneously every three months   rosuvastatin  (CRESTOR ) 10 mg, Oral, Daily at bedtime   tirzepatide  (MOUNJARO ) 12.5 mg, Subcutaneous, Weekly   triamcinolone  cream (KENALOG ) 0.1 % Apply a small amount to skin on lower legs twice daily.       Objective:   Physical Exam BP 126/82   Pulse 72   Temp 98.3 F (36.8 C) (Oral)   Resp 16   Ht 5' 9.5" (1.765 m)   Wt (!) 395  lb (179.2 kg)   SpO2 96%   BMI 57.50 kg/m  General:   Well developed, NAD, BMI noted. HEENT:  Normocephalic . Face symmetric, atraumatic Lungs:  CTA B Normal respiratory effort, no intercostal retractions, no accessory muscle use. Heart: RRR,  no murmur.  DM foot exam: Good perfusion throughout, pinprick examination normal Skin: Not pale. Not jaundice Neurologic:  alert & oriented X3.  Speech normal, gait appropriate for age and unassisted Psych--  Cognition and judgment appear intact.  Cooperative with normal attention span and concentration.  Behavior appropriate. No anxious or depressed appearing.      Assessment     Assessment   DM Chronic venous insufficiency/lymphedema/stasis dermatitis PSYCH: --Anxiety depression (suicidality w/  Citalopram) --ADHD Dx 2019, Dr Adell Age Morbid  obesity Vitamin D  and  B12 deficiency CV: New onset A. fib 11-2020.  Admitted  Calcium  coronary score 0 (11-2020) Endocrinology:Dr Gherghe --Pituitary mass, surgery : Prolactinoma --Hypogonadism hypogonadotropic --Infertility Migraines, d/c Topamax  05-2015 (pt concerned about short term memory)  OSA on CPAP -- DR Matilde Son Hematology -Mild chronic anemia, no previous colonoscopy or EGD. Iron , vitamins normal.  saw hematology 09/2017  -Splenomegaly: Per CT 09/20/2017, saw hematology --Abdominal lymphadenopathies: Felt to be stable, see hematology note from July 14, 2019. Pulmonary nodules: Felt to be stable per last CT. DERM: Psoriasis  H/o ET  dysfunction, declined ear tubes H/o hemorrhoids s/p  banding H/o abd pain 2012 (w/u CT, US , a HIDA scan), similar sx 02-2015 H/o  difficulty intubation   PLAN: DM: Currently on metformin  and Mounjaro  10 mg weekly, no ambulatory CBGs.  Checking A1c and micro. Feet exam negative. Morbid obesity: Feels like Mounjaro  is helping to some degree, would like to bump up the dose to 12.5 mg, will do.  Also, planning to have bariatric surgery October 2025 Vitamin B12: On no supplements.  Last level okay. Vitamin D  deficiency: On 5000 units every Friday, checking levels. Atrial fibrillation: Anticoagulated without apparent problems.  Essentially no symptoms.  To see cardiology next month. Preventive care: Declined PNM 20. RTC for bone CPX

## 2023-07-16 ENCOUNTER — Other Ambulatory Visit (HOSPITAL_COMMUNITY): Payer: Self-pay

## 2023-07-16 ENCOUNTER — Ambulatory Visit: Payer: Self-pay | Admitting: Internal Medicine

## 2023-07-16 MED ORDER — VITAMIN D (ERGOCALCIFEROL) 1.25 MG (50000 UNIT) PO CAPS
50000.0000 [IU] | ORAL_CAPSULE | ORAL | 0 refills | Status: AC
  Filled 2023-07-16: qty 12, 84d supply, fill #0

## 2023-07-16 NOTE — Addendum Note (Signed)
 Addended by: Tarez Bowns D on: 07/16/2023 04:01 PM   Modules accepted: Orders

## 2023-07-18 ENCOUNTER — Other Ambulatory Visit (HOSPITAL_COMMUNITY): Payer: Self-pay

## 2023-07-21 ENCOUNTER — Other Ambulatory Visit (HOSPITAL_COMMUNITY): Payer: Self-pay

## 2023-07-25 ENCOUNTER — Ambulatory Visit: Payer: Commercial Managed Care - PPO | Admitting: Cardiology

## 2023-07-25 ENCOUNTER — Other Ambulatory Visit: Payer: Self-pay

## 2023-07-25 ENCOUNTER — Ambulatory Visit
Admission: RE | Admit: 2023-07-25 | Discharge: 2023-07-25 | Disposition: A | Discharge status: Home / Self Care | Source: Ambulatory Visit | Attending: Physician Assistant | Admitting: Physician Assistant

## 2023-07-25 VITALS — BP 138/75 | HR 80 | Temp 98.0°F | Resp 20 | Ht 71.0 in | Wt 395.1 lb

## 2023-07-25 DIAGNOSIS — H6993 Unspecified Eustachian tube disorder, bilateral: Secondary | ICD-10-CM

## 2023-07-25 MED ORDER — METHYLPREDNISOLONE 4 MG PO TBPK: ORAL_TABLET | ORAL | 0 refills | Status: DC

## 2023-07-25 NOTE — ED Triage Notes (Signed)
 Pt presents with complaints of bilateral ear fullness with intermittent dizziness x 1 week. Pt currently rates his overall pain a 1/10, pain will come and go in the ears. OTC allergy medication (Allegra) + nasal spray (Flonase ) taken/used daily.

## 2023-07-25 NOTE — ED Provider Notes (Signed)
 Julian [place]    CSN: [id number] Arrival date & time: 07/25/23  1552      History   Chief Complaint Chief Complaint  Patient presents with   Ear Fullness    Dizziness - Entered by patient    HPI Julian Washington is a 43 y.o. male.   HPI  Pt reports concerns for ear fullness that has been ongoing for about 2 week and recently became worse He states he was seen by PCP at the beginning of the month and they told him he had mid ear effusion and should use antihistamine and flonase    He reports he has been doing this but fullness and pressure have gotten persistent  He takes allegra and flonase  daily to assist with this and to help prevent ear infections   Past Medical History:  Diagnosis Date   ADHD    Anemia    Anxiety and depression    h/o suicidality w/ citalopram   Atrial fibrillation (HCC)    Diabetes mellitus without complication (HCC)    Difficult intubation    ETD (eustachian tube dysfunction)    s/p ENT, declined ear tuves before    GERD (gastroesophageal reflux disease)    History of chicken pox    Titered on 08/16/2005   Hypertension    Hypogonadism male    Dr Aldona Amel, used to see urology   Infertility male    Internal hemorrhoids    s/p banding   Low testosterone     Migraine    on topamax    OSA on CPAP    Palpitations    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HCC)    h/o increased prolactin, Dr Aldona Amel (previously @ [facility])   Prediabetes    A1C 6.2 years ago   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   Vitamin B 12 deficiency    h/o   Vitamin D  deficiency     Patient Active Problem List   Diagnosis Date Noted   Cervicalgia of occipito-atlanto-axial region 01/28/2023   Obesity with alveolar hypoventilation and body mass index (BMI) of 40 or greater (HCC) 01/28/2023   Dyslipidemia 08/21/2021   Elevated troponin I level    Paroxysmal A-fib (HCC) 12/02/2020   Abnormal finding on lung imaging 06/26/2018   ADHD 02/28/2018   Chronic venous  insufficiency of lower extremity 02/28/2018   Psoriasis 02/24/2018   Splenomegaly 09/24/2017   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   HTN (hypertension) 12/04/2016   Type 2 diabetes mellitus without complication, without long-term current use of insulin  (HCC) 09/25/2016   Morbid obesity (HCC) 09/25/2016   PCP NOTES >>> 11/15/2014   H/O: pituitary tumor 01/28/2014   Annual physical exam 07/13/2013   Anxiety and depression    Vitamin B 12 deficiency    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 Authorizing Provider  methylPREDNISolone  (MEDROL  DOSEPAK) 4 MG TBPK tablet Please take per instructions on package 07/25/23  Yes Teanna Elem E, PA-C  apixaban  (ELIQUIS ) 5 MG TABS tablet Take 1 tablet (5 mg total) by mouth 2 (two) times  daily. 04/15/23   Boyce Byes, MD  augmented betamethasone  dipropionate (DIPROLENE -AF) 0.05 % cream Apply a small amount to affected area twice a day 05/20/23     Cholecalciferol (VITAMIN D3) 125 MCG (5000 UT) TABS Take 1 tablet by mouth 3 (three) times a week. Take M, W, F    [provider]  cyclobenzaprine  (FLEXERIL ) 10 MG tablet Take 1 tablet (10 mg total) by mouth at bedtime. 01/28/23   Dohmeier, Raoul Byes, MD  EPINEPHrine  (EPIPEN  2-PAK) 0.3 mg/0.3 mL IJ SOAJ injection Inject 0.3 mg into the muscle as needed. Patient not taking: Reported on 07/14/2023 01/21/22   Orelia Binet, MD  fexofenadine (ALLEGRA) 180 MG tablet Take 180 mg by mouth daily. 10/06/14   [provider]  flecainide  (TAMBOCOR ) 100 MG tablet Take 1 tablet (100 mg total) by mouth every 12 (twelve) hours. 04/01/23   Boyce Byes, MD   fluticasone  (FLONASE ) 50 MCG/ACT nasal spray Place 2 sprays into both nostrils daily. 02/23/22   Mardene Shake, FNP  losartan  (COZAAR ) 100 MG tablet Take 1 tablet (100 mg total) by mouth daily. 01/15/23   Boyce Byes, MD  Magnesium  Oxide (MAG-OXIDE PO) Take 400 mg by mouth at bedtime.    [provider]  metFORMIN  (GLUCOPHAGE -XR) 500 MG 24 hr tablet Take 2 tablets (1,000 mg total) by mouth daily with breakfast. 03/27/23   Paz, Jose E, MD  metoprolol  succinate (TOPROL -XL) 50 MG 24 hr tablet Take 1 tablet (50 mg total) by mouth 2 (two) times daily with or immediately following a meal 04/01/23   Boyce Byes, MD  omeprazole  (PRILOSEC) 40 MG capsule Take 1 capsule (40 mg total) by mouth daily before breakfast. 11/18/22   Webb, Padonda B, FNP  risankizumab -rzaa (SKYRIZI  PEN) 150 MG/ML pen Inject 1 pen injector subcutaneously every three months 02/18/23   Jegede, Olugbemiga E, MD  rosuvastatin  (CRESTOR ) 10 MG tablet Take 1 tablet (10 mg total) by mouth at bedtime. 03/27/23   Paz, Jose E, MD  tirzepatide  (MOUNJARO ) 12.5 MG/0.5ML Pen Inject 12.5 mg into the skin once a week. 07/14/23   Paz, Jose E, MD  triamcinolone  cream (KENALOG ) 0.1 % Apply a small amount to skin on lower legs twice daily. 05/20/23     Vitamin D , Ergocalciferol , (DRISDOL ) 1.25 MG (50000 UNIT) CAPS capsule Take 1 capsule (50,000 Units total) by mouth every 7 (seven) days. 07/16/23 10/08/23  Ezell Hollow, MD    Family History Family History  Problem Relation Age of Onset   Obesity Mother    Depression Father    Anxiety disorder Father    Bipolar disorder Father    Drug abuse Father    Cancer Maternal Grandfather        GBM    Diabetes Paternal Grandmother    Hypertension Paternal Grandmother    Diabetes Paternal Grandfather    Prostate cancer Paternal Grandfather    CAD Other        PGF   Colon cancer Neg Hx     Social History Social History   Tobacco Use   Smoking status: Never   Smokeless tobacco: Never   Vaping Use   Vaping status: Never Used  Substance Use Topics   Alcohol use: Not Currently    Comment: none x 10 yrs   Drug use: No     Allergies   Influenza virus vaccine, Levaquin [levofloxacin in d5w], Celexa [citalopram], Dextrans, Hydrocodone-acetaminophen , Latex, Oxycodone, and Shiitake mushroom (obsolete)   Review of Systems Review  of Systems  Constitutional:  Negative for chills and fever.  HENT:  Positive for congestion (intermittent), ear pain and hearing loss (pt suspects he has some hearing loss). Negative for ear discharge, rhinorrhea and sore throat.        Ear fullness      Physical Exam Triage Vital Signs ED Triage Vitals  Encounter Vitals Group     BP 07/25/23 1559 138/75     Girls Systolic BP Percentile --      Girls Diastolic BP Percentile --      Boys Systolic BP Percentile --      Boys Diastolic BP Percentile --      Pulse Rate 07/25/23 1559 80     Resp 07/25/23 1559 20     Temp 07/25/23 1559 98 F (36.7 C)     Temp Source 07/25/23 1559 Oral     SpO2 07/25/23 1559 95 %     Weight 07/25/23 1559 (!) 395 lb 1 oz (179.2 kg)     Height 07/25/23 1559 5' 11 (1.803 m)     Head Circumference --      Peak Flow --      Pain Score 07/25/23 1626 1     Pain Loc --      Pain Education --      Exclude from Growth Chart --    No data found.  Updated Vital Signs BP 138/75 (BP Location: Right Arm)   Pulse 80   Temp 98 F (36.7 C) (Oral)   Resp 20   Ht 5' 11 (1.803 m)   Wt (!) 395 lb 1 oz (179.2 kg)   SpO2 95%   BMI 55.10 kg/m   Visual Acuity Right Eye Distance:   Left Eye Distance:   Bilateral Distance:    Right Eye Near:   Left Eye Near:    Bilateral Near:     Physical Exam Vitals reviewed.  Constitutional:      General: He is awake.     Appearance: Normal appearance. He is well-developed and well-groomed.  HENT:     Head: Normocephalic and atraumatic.     Right Ear: Hearing and ear canal normal. A middle ear effusion is present.  Tympanic membrane is scarred. Tympanic membrane is not injected, perforated, erythematous, retracted or bulging.     Left Ear: Hearing and ear canal normal. Tympanic membrane is scarred. Tympanic membrane is not injected, perforated, erythematous, retracted or bulging.     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     Conjunctiva/sclera: Conjunctivae normal.   Pulmonary:     Effort: Pulmonary effort is normal.   Musculoskeletal:     Cervical back: Normal range of motion.   Neurological:     General: No focal deficit present.     Mental Status: He is alert and oriented to person, place, and time.     GCS: GCS eye subscore is 4. GCS verbal subscore is 5. GCS motor subscore is 6.   Psychiatric:        Attention and Perception: Attention normal.        Mood and Affect: Mood normal.        Speech: Speech normal.        Behavior: Behavior normal. Behavior is cooperative.      [place] Treatments /  Results  Labs (all labs ordered are listed, but only abnormal results are displayed) Labs Reviewed - No data to display  EKG   Radiology No results found.  Procedures Procedures (including critical care time)  Medications Ordered in [place] Medications - No data to display  Initial Impression / Assessment and Plan / [place] Course  I have reviewed the triage vital signs and the nursing notes.  Pertinent labs & imaging results that were available during my care of the patient were reviewed by me and considered in my medical decision making (see chart for details).     A1c is 6.2 % per labs from 07/14/23   Final Clinical Impressions(s) / [place] Diagnoses   Final diagnoses:  Eustachian tube dysfunction, bilateral   Patient presents today with concerns of bilateral ear fullness.  He reports a previous history of eustachian tube  dysfunction as well as recurrent ear infections.  Physical exam is notable for mild middle ear effusion as well as cholesteatomas.  No obvious signs of otitis media or otitis externa.  Recommend continued use of second-generation antihistamine as well as Flonase  for management.  Will send in Medrol  Dosepak to further assist with inflammation and  irritation per patient request.  Follow-up as needed for progressing or persistent symptoms    Discharge Instructions      You were seen today for concerns of ear fullness as well as mild ear pain. Your physical exam does not show signs of an acute ear infection but there is signs of middle ear effusion.  Given your previous history I suspect that you likely have eustachian tube dysfunction.  I recommend you continue use antihistamine as well as Flonase  to further assist with management. Per your request I have sent in a steroid taper to assist with swelling inflammation.  Please take this according to the directions on the box.  If you feel like your symptoms are not improving or seem to be worsening please follow-up with your PCP or return to urgent care for more acute symptoms.     ED Prescriptions     Medication Sig Dispense Auth. Provider   methylPREDNISolone  (MEDROL  DOSEPAK) 4 MG TBPK tablet Please take per instructions on package 21 each Elliett Guarisco E, PA-C      PDMP not reviewed this encounter.   Woodford Hayward 07/25/23 1906

## 2023-07-25 NOTE — Discharge Instructions (Addendum)
 You were seen today for concerns of ear fullness as well as mild ear pain. Your physical exam does not show signs of an acute ear infection but there is signs of middle ear effusion.  Given your previous history I suspect that you likely have eustachian tube dysfunction.  I recommend you continue use antihistamine as well as Flonase  to further assist with management. Per your request I have sent in a steroid taper to assist with swelling inflammation.  Please take this according to the directions on the box.  If you feel like your symptoms are not improving or seem to be worsening please follow-up with your PCP or return to urgent care for more acute symptoms.

## 2023-07-30 ENCOUNTER — Other Ambulatory Visit: Payer: Self-pay

## 2023-07-30 NOTE — Progress Notes (Signed)
  Electrophysiology Office Follow up Visit Note:    Date:  08/01/2023   ID:  Julian Washington, DOB 09-12-1980, MRN [id number]  PCP:  Amon Aloysius BRAVO, MD  [facility] Cardiologist:  OLE ONEIDA HOLTS, MD  [facility] Electrophysiologist:  None    Interval History:     Julian Washington is a 43 y.o. male who presents for a follow up visit.   I last saw the patient January 15, 2023.  He has atrial fibrillation, sleep apnea on CPAP, diabetes, hypertension and obesity.  He takes flecainide  for rhythm control with good control of his atrial fibrillation.  He takes Eliquis  for stroke prophylaxis.  He is doing well today.  Is planning for gastric bypass surgery at [facility] in October.  He has been notified that he needs to stop taking metoprolol  succinate after gastric bypass.  He recently completed a 5-day steroid pack.  During that time he felt like his heart rhythm was slightly more irritable but thankfully no sustained arrhythmias.  The steroid also caused his balance to be off.  This is improving since stopping the steroid.       Past medical, surgical, social and family history were reviewed.  ROS:   Please see the history of present illness.    All other systems reviewed and are negative.  EKGs/Labs/Other Studies Reviewed:    The following studies were reviewed today:  January 15, 2023 EKG shows PR 172 ms, QRS duration 102 ms, sinus rhythm   EKG Interpretation Date/Time:  Friday August 01 2023 08:33:07 EDT Ventricular Rate:  72 PR Interval:  180 QRS Duration:  106 QT Interval:  408 QTC Calculation: 446 R Axis:   25  Text Interpretation: Normal sinus rhythm Confirmed by HOLTS OLE [phone]) on 08/01/2023 8:40:12 AM    Physical Exam:    VS:  BP 122/70 (BP Location: Left Arm, Patient Position: Sitting, Cuff Size: Large)   Pulse 72   Ht 5' 11 (1.803 m)   Wt (!) 378 lb 9.6 oz (171.7 kg)   SpO2 97%   BMI 52.80 kg/m     Wt Readings from Last 3 Encounters:  08/01/23 (!)  378 lb 9.6 oz (171.7 kg)  07/14/23 (!) 395 lb (179.2 kg)  06/02/23 (!) 395 lb 14.4 oz (179.6 kg)     GEN: no distress CARD: RRR, No MRG RESP: No IWOB. CTAB.      ASSESSMENT:    1. Preoperative cardiovascular examination   2. Paroxysmal atrial fibrillation (HCC)   3. Encounter for long-term (current) use of high-risk medication    PLAN:    In order of problems listed above:  #Paroxysmal atrial fibrillation #High risk drug use-flecainide  Doing well on flecainide .  PR and QRS durations acceptable for ongoing flecainide  use. Continue Eliquis  for stroke prophylaxis Transition metoprolol  succinate to metoprolol  tartrate 50 mg by mouth twice daily  #Preop risk stratification The patient is at acceptable risk to undergo planned surgery.  Okay to hold Eliquis  for 2 to 3 days prior to the planned procedure and resume when felt safe from a surgical perspective postoperatively.  The patient understands that there is a slight increased risk of stroke while he holds anticoagulation.  Follow-up in 6 months with the EP APP  Signed, OLE HOLTS, MD, [facility], [facility] 08/01/2023 8:44 AM    Electrophysiology [facility]

## 2023-08-01 ENCOUNTER — Other Ambulatory Visit (HOSPITAL_COMMUNITY): Payer: Self-pay

## 2023-08-01 ENCOUNTER — Ambulatory Visit: Payer: Commercial Managed Care - PPO | Attending: Cardiology | Admitting: Cardiology

## 2023-08-01 ENCOUNTER — Encounter: Payer: Self-pay | Admitting: Cardiology

## 2023-08-01 VITALS — BP 122/70 | HR 72 | Ht 71.0 in | Wt 378.6 lb

## 2023-08-01 DIAGNOSIS — Z01810 Encounter for preprocedural cardiovascular examination: Secondary | ICD-10-CM | POA: Diagnosis not present

## 2023-08-01 DIAGNOSIS — Z79899 Other long term (current) drug therapy: Secondary | ICD-10-CM

## 2023-08-01 DIAGNOSIS — I480 Paroxysmal atrial fibrillation: Secondary | ICD-10-CM

## 2023-08-01 MED ORDER — METOPROLOL TARTRATE 50 MG PO TABS
50.0000 mg | ORAL_TABLET | Freq: Two times a day (BID) | ORAL | 1 refills | Status: DC
  Filled 2023-08-01: qty 180, 90d supply, fill #0
  Filled 2023-11-09: qty 180, 90d supply, fill #1

## 2023-08-01 NOTE — Patient Instructions (Signed)
 Medication Instructions:  Your physician has recommended you make the following change in your medication:   ** Stop Metoprolol  Succinate  ** Begin Metoprolol  Tartrate 50mg  - 1 tablet by mouth twice daily  *If you need a refill on your cardiac medications before your next appointment, please call your pharmacy*  Lab Work: None ordered.  If you have labs (blood work) drawn today and your tests are completely normal, you will receive your results only by: MyChart Message (if you have MyChart) OR A paper copy in the mail If you have any lab test that is abnormal or we need to change your treatment, we will call you to review the results.  Testing/Procedures: None ordered.   Follow-Up: At [facility], you and your health needs are our priority.  As part of our continuing mission to provide you with exceptional heart care, our providers are all part of one team.  This team includes your primary Cardiologist (physician) and Advanced Practice Providers or APPs (Physician Assistants and Nurse Practitioners) who all work together to provide you with the care you need, when you need it.  Your next appointment:   6 months with Dr Candace Cerise PA

## 2023-08-05 ENCOUNTER — Other Ambulatory Visit: Payer: Self-pay

## 2023-08-13 ENCOUNTER — Other Ambulatory Visit (HOSPITAL_COMMUNITY): Payer: Self-pay

## 2023-08-13 ENCOUNTER — Telehealth (HOSPITAL_COMMUNITY): Payer: Self-pay

## 2023-08-13 NOTE — Telephone Encounter (Signed)
 Opened in error

## 2023-08-14 ENCOUNTER — Other Ambulatory Visit (HOSPITAL_COMMUNITY): Payer: Self-pay

## 2023-08-14 ENCOUNTER — Other Ambulatory Visit: Payer: Self-pay

## 2023-08-14 ENCOUNTER — Other Ambulatory Visit: Payer: Self-pay | Admitting: Internal Medicine

## 2023-08-14 MED ORDER — OMEPRAZOLE 40 MG PO CPDR
40.0000 mg | DELAYED_RELEASE_CAPSULE | Freq: Every day | ORAL | 1 refills | Status: DC
  Filled 2023-08-14: qty 90, 90d supply, fill #0
  Filled 2023-11-09: qty 90, 90d supply, fill #1

## 2023-08-19 ENCOUNTER — Other Ambulatory Visit (HOSPITAL_COMMUNITY): Payer: Self-pay

## 2023-08-22 ENCOUNTER — Other Ambulatory Visit: Payer: Self-pay | Admitting: Pharmacy Technician

## 2023-08-22 ENCOUNTER — Other Ambulatory Visit: Payer: Self-pay

## 2023-08-22 ENCOUNTER — Encounter (INDEPENDENT_AMBULATORY_CARE_PROVIDER_SITE_OTHER): Payer: Self-pay

## 2023-08-22 NOTE — Progress Notes (Signed)
 Specialty Pharmacy Refill Coordination Note  Julian Washington is a 43 y.o. male contacted today regarding refills of specialty medication(s) Risankizumab -rzaa (Skyrizi  Pen)   Patient requested (Patient-Rptd) Pickup at [facility] at [facility] date: (Patient-Rptd) 09/04/23   Medication will be filled on 09/03/23.

## 2023-09-03 ENCOUNTER — Other Ambulatory Visit: Payer: Self-pay

## 2023-09-15 ENCOUNTER — Other Ambulatory Visit: Payer: Self-pay | Admitting: Internal Medicine

## 2023-09-15 MED ORDER — MOUNJARO 12.5 MG/0.5ML ~~LOC~~ SOAJ
12.5000 mg | SUBCUTANEOUS | 1 refills | Status: DC
  Filled 2023-09-15: qty 2, 28d supply, fill #0
  Filled 2023-10-14: qty 2, 28d supply, fill #1

## 2023-09-16 ENCOUNTER — Other Ambulatory Visit (HOSPITAL_COMMUNITY): Payer: Self-pay

## 2023-10-03 ENCOUNTER — Other Ambulatory Visit (HOSPITAL_COMMUNITY): Payer: Self-pay

## 2023-10-03 ENCOUNTER — Other Ambulatory Visit: Payer: Self-pay | Admitting: Internal Medicine

## 2023-10-03 MED ORDER — ROSUVASTATIN CALCIUM 10 MG PO TABS
10.0000 mg | ORAL_TABLET | Freq: Every day | ORAL | 1 refills | Status: AC
  Filled 2023-10-03: qty 90, 90d supply, fill #0
  Filled 2024-01-05: qty 90, 90d supply, fill #1

## 2023-10-06 ENCOUNTER — Encounter: Payer: Self-pay | Admitting: Internal Medicine

## 2023-10-06 NOTE — Telephone Encounter (Signed)
 RF metformin  XR x 6 months please

## 2023-10-07 ENCOUNTER — Other Ambulatory Visit (HOSPITAL_COMMUNITY): Payer: Self-pay

## 2023-10-07 MED ORDER — METFORMIN HCL 1000 MG PO TABS
1000.0000 mg | ORAL_TABLET | Freq: Two times a day (BID) | ORAL | 1 refills | Status: DC
  Filled 2023-10-07: qty 180, 90d supply, fill #0
  Filled 2024-01-05: qty 180, 90d supply, fill #1

## 2023-10-07 NOTE — Addendum Note (Signed)
 Addended by: Dionis Autry D on: 10/07/2023 07:32 AM   Modules accepted: Orders

## 2023-10-10 ENCOUNTER — Other Ambulatory Visit (HOSPITAL_COMMUNITY): Payer: Self-pay

## 2023-10-10 ENCOUNTER — Telehealth: Admitting: Family Medicine

## 2023-10-10 ENCOUNTER — Encounter: Payer: Self-pay | Admitting: Family Medicine

## 2023-10-10 DIAGNOSIS — H6991 Unspecified Eustachian tube disorder, right ear: Secondary | ICD-10-CM | POA: Diagnosis not present

## 2023-10-10 MED ORDER — PREDNISONE 10 MG (21) PO TBPK
ORAL_TABLET | ORAL | 0 refills | Status: DC
  Filled 2023-10-10: qty 21, 6d supply, fill #0

## 2023-10-10 MED ORDER — FLUTICASONE PROPIONATE 50 MCG/ACT NA SUSP
2.0000 | Freq: Every day | NASAL | 6 refills | Status: AC
  Filled 2023-10-10: qty 16, 30d supply, fill #0

## 2023-10-10 NOTE — Progress Notes (Signed)
 E-Visit for Ear Pain - Eustachian Tube Dysfunction   We are sorry that you are not feeling well. Here is how we plan to help!  Based on what you have shared with me it looks like you have Eustachian Tube Dysfunction.  Eustachian Tube Dysfunction is a condition where the tubes that connect your middle ears to your upper throat become blocked. This can lead to discomfort, hearing difficulties and a feeling of fullness in your ear. Eustachian tube dysfunction usually resolves itself in a few days. The usual symptoms include: Hearing problems Tinnitus, or ringing in your ears Clicking or popping sounds A feeling of fullness in your ears Pain that mimics an ear infection Dizziness, vertigo or balance problems A "tickling" sensation in your ears  ?Eustachian tube dysfunction symptoms may get worse in higher altitudes. This is called barotrauma, and it can happen while scuba diving, flying in an airplane or driving in the mountains.   What causes eustachian tube dysfunction? Allergies and infections (like the common cold and the flu) are the most common causes of eustachian tube dysfunction. These conditions can cause inflammation and mucus buildup, leading to blockage. GERD, or chronic acid reflux, can also cause ETD. This is because stomach acid can back up into your throat and result in inflammation. As mentioned above, altitude changes can also cause ETD.   What are some common eustachian tube dysfunction treatments? In most cases, treatment isn't necessary because ETD often resolves on its own. However, you might need treatment if your symptoms linger for more than two weeks.    Eustachian tube dysfunction treatment depends on the cause and the severity of your condition. Treatments may include home remedies, medications or, in severe cases, surgery.     HOME CARE: Sometimes simple home remedies can help with mild cases of eustachian tube dysfunction. To try and clear the blockage, you  can: Chew gum. Yawn. Swallow. Try the Valsalva maneuver (breathing out forcefully while closing your mouth and pinching your nostrils). Use a saline spray to clear out nasal passages.  MEDICATIONS: Over-the-counter medications can help if allergies are causing eustachian tube dysfunction. Try antihistamines (like cetirizine  or diphenhydramine) to ease your symptoms. If you have discomfort, pain relievers -- such as acetaminophen  or ibuprofen  -- can help.  Sometimes intranasal glucocorticosteroids (like Flonase  or Nasacort ) help.  I have prescribed Fluticasone  50 mcg/spray 2 sprays in each nostril daily for 10-14 days I have prescribed prednisone  10 mg tablets to be taken as a taper over 6 days.  Please watch your blood sugars closely while taking oral steroids.   GET HELP RIGHT AWAY IF: Fever is over 102.2 degrees. You develop progressive ear pain or hearing loss. Ear symptoms persist longer than 3 days after treatment.  MAKE SURE YOU: Understand these instructions. Will watch your condition. Will get help right away if you are not doing well or get worse.  Thank you for choosing an e-visit.  Your e-visit answers were reviewed by a board certified advanced clinical practitioner to complete your personal care plan. Depending upon the condition, your plan could have included both over the counter or prescription medications.  Please review your pharmacy choice. Make sure the pharmacy is open so you can pick up the prescription now. If there is a problem, you may contact your provider through [employer] and have the prescription routed to another pharmacy.  Your safety is important to us . If you have drug allergies check your prescription carefully.   For the next 24  hours you can use MyChart to ask questions about today's visit, request a non-urgent call back, or ask for a work or school excuse. You will get an email with a survey after your eVisit asking about your experience. We  would appreciate your feedback. I hope that your e-visit has been valuable and will aid in your recovery.   I have spent 5 minutes in review of e-visit questionnaire, review and updating patient chart, medical decision making and response to patient.   Olam DELENA Darby, FNP

## 2023-10-15 ENCOUNTER — Other Ambulatory Visit: Payer: Self-pay

## 2023-10-22 ENCOUNTER — Ambulatory Visit: Admitting: Medical

## 2023-10-22 ENCOUNTER — Other Ambulatory Visit (HOSPITAL_BASED_OUTPATIENT_CLINIC_OR_DEPARTMENT_OTHER): Payer: Self-pay

## 2023-10-22 VITALS — BP 140/88 | HR 75 | Temp 97.5°F | Resp 17 | Ht 71.0 in | Wt 374.8 lb

## 2023-10-22 DIAGNOSIS — B9689 Other specified bacterial agents as the cause of diseases classified elsewhere: Secondary | ICD-10-CM | POA: Diagnosis not present

## 2023-10-22 DIAGNOSIS — J309 Allergic rhinitis, unspecified: Secondary | ICD-10-CM | POA: Diagnosis not present

## 2023-10-22 DIAGNOSIS — H6991 Unspecified Eustachian tube disorder, right ear: Secondary | ICD-10-CM | POA: Diagnosis not present

## 2023-10-22 DIAGNOSIS — H6691 Otitis media, unspecified, right ear: Secondary | ICD-10-CM | POA: Diagnosis not present

## 2023-10-22 DIAGNOSIS — R0981 Nasal congestion: Secondary | ICD-10-CM | POA: Diagnosis not present

## 2023-10-22 MED ORDER — AMOXICILLIN-POT CLAVULANATE 875-125 MG PO TABS
1.0000 | ORAL_TABLET | Freq: Two times a day (BID) | ORAL | 0 refills | Status: DC
  Filled 2023-10-22: qty 20, 10d supply, fill #0

## 2023-10-22 MED ORDER — AZELASTINE HCL 0.1 % NA SOLN
2.0000 | Freq: Two times a day (BID) | NASAL | 12 refills | Status: DC
  Filled 2023-10-22: qty 30, 50d supply, fill #0

## 2023-10-22 NOTE — Patient Instructions (Signed)
 Right otitis media with eustachian tube dysfunction Chronic eustachian tube dysfunction exacerbated by air travel and upper respiratory infection, with earache, dizziness, and ear infection/OM. - Prescribe Augmentin  for 10 days. - Recommend Afrin nasal spray 30 minutes before flights to prevent congestion. - Advise against overuse of Afrin to avoid rebound congestion. - Suggest chewing gum during flights for pressure equalization. - Follow up in 10 days or sooner if needed - Send MyChart update if pain worsens or changes.  Allergic rhinitis with nasal congestion Chronic allergic rhinitis with nasal congestion, managed with Flonase  and rotating antihistamines. Symptoms worsened by seasonal allergies and recent infection. Astelin  may provide additional relief. - Continue Flonase  nasal spray. - Continue current antihistamine regimen with Claritin . - Prescribe Astelin  nasal spray for additional relief.

## 2023-10-22 NOTE — Progress Notes (Signed)
 Subjective:    Patient ID: Julian Washington, male    DOB: 07-05-1980, 43 y.o.   MRN: [id number]  HPI  Julian Washington is a 43 year old male with eustachian tube dysfunction who presents with ear pain and dizziness.  He has been experiencing ear pain and dizziness following a flight on August 17th. His history of eustachian tube dysfunction typically causes difficulty in equalizing pressure after flying. After the flight, he developed an upper respiratory infection, described as viral, leading to fluid accumulation behind the ear.  Initially, he managed the condition with a virtual visit where he was prescribed steroids, which helped reduce the fluid but did not completely resolve his symptoms. He continues to experience occasional dizziness and ear pain, which has persisted for about a week and a half, prompting this visit.  He has a history of recurrent ear infections, both as a child and occasionally as an adult, and has had tubes placed twice during childhood. He experiences seasonal allergies and manages them by rotating between Allegra and loratadine  every three months, currently taking Claritin . He also uses Flonase  regularly.  No dental pain is reported. He uses a CPAP machine to decrease the risk of ear infections. He notes some pain below and in front of the ear, but no other significant symptoms were reported.     Review of Systems  Constitutional:  Negative for chills and fatigue.  HENT:  Positive for congestion and ear pain. Negative for sinus pressure, sinus pain and sore throat.   Respiratory:  Negative for cough, chest tightness, shortness of breath and wheezing.   Cardiovascular:  Negative for chest pain and palpitations.  Gastrointestinal:  Negative for abdominal pain.  Genitourinary:  Negative for dysuria, frequency and penile discharge.  Musculoskeletal:  Negative for back pain.  Skin:  Negative for rash.  Neurological:  Negative for dizziness, seizures and headaches.   Hematological:  Negative for adenopathy.  Psychiatric/Behavioral:  Negative for behavioral problems and decreased concentration.    Past Medical History:  Diagnosis Date   ADHD    Anemia    Anxiety and depression    h/o suicidality w/ citalopram   Atrial fibrillation (HCC)    Diabetes mellitus without complication (HCC)    Difficult intubation    ETD (eustachian tube dysfunction)    s/p ENT, declined ear tuves before    GERD (gastroesophageal reflux disease)    History of chicken pox    Titered on 08/16/2005   Hypertension    Hypogonadism male    Dr Trixie, used to see urology   Infertility male    Internal hemorrhoids    s/p banding   Low testosterone     Migraine    on topamax    OSA on CPAP    Palpitations    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HCC)    h/o increased prolactin, Dr Trixie (previously @ [facility])   Prediabetes    A1C 6.2 years ago   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   Vitamin B 12 deficiency    h/o   Vitamin D  deficiency      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    Employer: [employer]   Occupation: Teacher, 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  Tobacco Use   Smoking status: Never   Smokeless tobacco: Never  Vaping Use   Vaping status: Never Used  Substance and Sexual Activity   Alcohol use: Not Currently    Comment: none x 10 yrs   Drug use: No   Sexual activity: Not on file  Other Topics Concern   Not on file  Social History Narrative   Lives w/ wife   1 dog    Social Drivers of Health   Financial Resource Strain: Low Risk  (10/22/2023)   Overall Financial Resource Strain (CARDIA)    Difficulty of Paying Living Expenses: Not hard at all  Food Insecurity: No Food Insecurity (10/22/2023)   Hunger Vital Sign    Worried About  Running Out of Food in the Last Year: Never true    Ran Out of Food in the Last Year: Never true  Transportation Needs: No Transportation Needs (10/22/2023)   PRAPARE - Administrator, Civil Service (Medical): No    Lack of Transportation (Non-Medical): No  Physical Activity: Inactive (10/22/2023)   Exercise Vital Sign    Days of Exercise per Week: 0 days    Minutes of Exercise per Session: Not on file  Stress: No Stress Concern Present (10/22/2023)   [employer] of Occupational Health - Occupational Stress Questionnaire    Feeling of Stress: Only a little  Social Connections: Socially Integrated (10/22/2023)   Social Connection and Isolation Panel    Frequency of Communication with Friends and Family: More than three times a week    Frequency of Social Gatherings with Friends and Family: Twice a week    Attends Religious Services: 1 to 4 times per year    Active Member of [employer] or Organizations: Yes    Attends Banker Meetings: 1 to 4 times per year    Marital Status: Married  Catering manager Violence: Unknown (01/16/2022)   Received from [facility]   HITS    Physically Hurt: Not on file    Insult or Talk Down To: Not on file    Threaten Physical Harm: Not on file    Scream or Curse: Not on file    Past Surgical History:  Procedure Laterality Date   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-04-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, ACTH    TONSILLECTOMY AND ADENOIDECTOMY      Family History  Problem Relation Age of Onset   Obesity Mother    Depression Father    Anxiety disorder Father    Bipolar disorder Father    Drug abuse Father    Cancer Maternal Grandfather        GBM    Diabetes Paternal Grandmother    Hypertension Paternal Grandmother    Diabetes Paternal Grandfather    Prostate cancer Paternal Grandfather    CAD Other        PGF   Colon cancer Neg Hx     Allergies  Allergen Reactions   Influenza Virus Vaccine Anaphylaxis   Levaquin [Levofloxacin In D5w]  Swelling   Celexa [Citalopram] Other (See Comments)    Mental status changes    Dextrans Other (See Comments)    Makes the pt. Drowsy.    Hydrocodone-Acetaminophen      REACTION: itching   Latex    Oxycodone Itching   Shiitake Mushroom (Obsolete) Rash    Current Outpatient Medications on File Prior to Visit  Medication Sig Dispense Refill   apixaban  (ELIQUIS ) 5 MG TABS tablet Take 1 tablet (5 mg  total) by mouth 2 (two) times daily. 180 tablet 2   augmented betamethasone  dipropionate (DIPROLENE -AF) 0.05 % cream Apply a small amount to affected area twice a day 50 g 1   Cholecalciferol (VITAMIN D3) 125 MCG (5000 UT) TABS Take 1 tablet by mouth 3 (three) times a week. Take M, W, F (Patient taking differently: Take 1 tablet by mouth daily.)     cyclobenzaprine  (FLEXERIL ) 10 MG tablet Take 1 tablet (10 mg total) by mouth at bedtime. 90 tablet 3   fexofenadine (ALLEGRA) 180 MG tablet Take 180 mg by mouth daily.     flecainide  (TAMBOCOR ) 100 MG tablet Take 1 tablet (100 mg total) by mouth every 12 (twelve) hours. 180 tablet 3   fluticasone  (FLONASE ) 50 MCG/ACT nasal spray Place 2 sprays into both nostrils daily. 16 g 6   losartan  (COZAAR ) 100 MG tablet Take 1 tablet (100 mg total) by mouth daily. 90 tablet 3   Magnesium  Oxide (MAG-OXIDE PO) Take 400 mg by mouth at bedtime.     metFORMIN  (GLUCOPHAGE ) 1000 MG tablet Take 1 tablet (1,000 mg total) by mouth 2 (two) times daily with a meal. 180 tablet 1   metoprolol  tartrate (LOPRESSOR ) 50 MG tablet Take 1 tablet (50 mg total) by mouth 2 (two) times daily. 180 tablet 1   omeprazole  (PRILOSEC) 40 MG capsule Take 1 capsule (40 mg total) by mouth daily before breakfast. 90 capsule 1   risankizumab -rzaa (SKYRIZI  PEN) 150 MG/ML pen Inject 1 pen injector subcutaneously every three months 1 mL 3   rosuvastatin  (CRESTOR ) 10 MG tablet Take 1 tablet (10 mg total) by mouth at bedtime. 90 tablet 1   tirzepatide  (MOUNJARO ) 12.5 MG/0.5ML Pen Inject 12.5 mg into the  skin once a week. 2 mL 1   triamcinolone  cream (KENALOG ) 0.1 % Apply a small amount to skin on lower legs twice daily. 80 g 0   No current facility-administered medications on file prior to visit.    BP (!) 140/88   Pulse 75   Temp (!) 97.5 F (36.4 C) (Oral)   Resp 17   Ht 5' 11 (1.803 m)   Wt (!) 374 lb 12.8 oz (170 kg)   SpO2 98%   BMI 52.27 kg/m            Objective:   Physical Exam  General- No acute distress. Pleasant patient. Neck- Full range of motion, no jvd Lungs- Clear, even and unlabored. Heart- regular rate and rhythm. Neurologic- CNII- XII grossly intact.  Heent- Rt tm scarring to tm. Center and upper portion mild pinkish red(no mastoid pain but pain just below and in front of ear). Left tm- old scar but tm looks normal.        Assessment & Plan:   Patient Instructions  Right otitis media with eustachian tube dysfunction Chronic eustachian tube dysfunction exacerbated by air travel and upper respiratory infection, with earache, dizziness, and ear infection/OM. - Prescribe Augmentin  for 10 days. - Recommend Afrin nasal spray 30 minutes before flights to prevent congestion. - Advise against overuse of Afrin to avoid rebound congestion. - Suggest chewing gum during flights for pressure equalization. - Follow up in 10 days or sooner if needed - Send MyChart update if pain worsens or changes.  Allergic rhinitis with nasal congestion Chronic allergic rhinitis with nasal congestion, managed with Flonase  and rotating antihistamines. Symptoms worsened by seasonal allergies and recent infection. Astelin  may provide additional relief. - Continue Flonase  nasal spray. - Continue current antihistamine regimen  with Claritin . - Prescribe Astelin  nasal spray for additional relief.  Vaneza Pickart, PA-C

## 2023-10-28 ENCOUNTER — Other Ambulatory Visit: Payer: Self-pay

## 2023-11-03 ENCOUNTER — Ambulatory Visit: Admitting: Medical

## 2023-11-03 ENCOUNTER — Other Ambulatory Visit: Payer: Self-pay

## 2023-11-03 ENCOUNTER — Telehealth: Payer: Self-pay | Admitting: Physician Assistant

## 2023-11-03 VITALS — BP 140/98 | HR 77 | Temp 98.4°F | Resp 16 | Ht 71.0 in | Wt 382.4 lb

## 2023-11-03 DIAGNOSIS — H6690 Otitis media, unspecified, unspecified ear: Secondary | ICD-10-CM

## 2023-11-03 DIAGNOSIS — H6991 Unspecified Eustachian tube disorder, right ear: Secondary | ICD-10-CM

## 2023-11-03 MED ORDER — CEFTRIAXONE SODIUM 1 G IJ SOLR
1.0000 g | Freq: Once | INTRAMUSCULAR | Status: AC
  Administered 2023-11-03: 1 g via INTRAMUSCULAR

## 2023-11-03 NOTE — Patient Instructions (Signed)
 Right otitis media with residual pain Residual low-level pain post-Augmentin  with no active infection. But based on history and mild persistent pain controlled for residual infection.  - Administer Rocephin  1 gram injection. - Observe for 10 minutes post-injection.  Eustachian tube dysfunction with allergic rhintis Dysfunction playing a role in ear discomfort, managed with nasal sprays. - Continue Flonase  nasal spray. - Use Astelin  nasal spray with Flonase  for 2-3 weeks. - Reassess in 2-3 weeks, consider tapering to Flonase  only if symptoms resolve.  Allergic rhinitis Perennial symptoms managed with Allegra or Zyrtec . - Continue Allegra or Zyrtec .  Follow up as regularly scheduled with pcp or sooner if needed

## 2023-11-03 NOTE — Progress Notes (Signed)
   Subjective:    Patient ID: Julian Washington, male    DOB: 1980-03-21, 43 y.o.   MRN: [id number]  HPI  Julian Washington is a 43 year old male with chronic eustachian tube dysfunction who presents with residual right ear pain following treatment for otitis media.  He has a history of right ear infection that began in August and was treated with Augmentin , completing the course of antibiotics approximately ten days ago. The current pain is described as 'low level' and 'residual', significantly less intense than before, approximately 'two levels lower'. The pain is located inside the ear and is intermittent.  He uses Flonase  nasal spray regularly and has a history of allergies, using Allegra or Zyrtec  year-round to manage symptoms. He has chronic eustachian tube dysfunction and has been using Astelin  nasal spray in combination with Flonase , although he had not used Astelin  for a few days prior to the visit.   Review of Systems See hpi    Objective:   Physical Exam   General- No acute distress. Pleasant patient. Neck- Full range of motion, no jvd Lungs- Clear, even and unlabored. Heart- regular rate and rhythm. Neurologic- CNII- XII grossly intact.  Heent- Rt tm scarring to tm. Center and upper portion no lonter red(no mastoid pain and no longer pain just below and in front of ear). Left tm- old scar but tm looks normal.     Assessment & Plan:   Patient Instructions  Right otitis media with residual pain Residual low-level pain post-Augmentin  with no active infection. But based on history and mild persistent pain controlled for residual infection.  - Administer Rocephin  1 gram injection. - Observe for 10 minutes post-injection.  Eustachian tube dysfunction with allergic rhintis Dysfunction playing a role in ear discomfort, managed with nasal sprays. - Continue Flonase  nasal spray. - Use Astelin  nasal spray with Flonase  for 2-3 weeks. - Reassess in 2-3 weeks, consider tapering to  Flonase  only if symptoms resolve.  Allergic rhinitis Perennial symptoms managed with Allegra or Zyrtec . - Continue Allegra or Zyrtec .  Follow up as regularly scheduled with pcp or sooner if needed   Lakechia Nay, PA-C

## 2023-11-03 NOTE — Telephone Encounter (Signed)
   The patient called the answering service after-hours today. He fell off a porch today. It was a mechanical fall. It was due to some loose steps. No LOC or dizziness. No chest pain, palpitations. He is on Eliquis . He hit his left hip and skinned his elbow. He was immediately able to ambulate afterwards and has not seen any deformity of the joint. He did not hit his head. He has not seen any bruising. His mother was concerned and asked him to call to ask about whether he needs to be concerned about internal bleeding since he is on Eliquis . We discussed that unfortunately we cannot evaluate for this over the phone and that it's never a bad thing to get checked out. I told him that as long as he felt fine otherwise, it would be reasonable to go to urgent care over ER this evening to have in person evaluation. Regardless I told him if he chooses not to go for in for evaluation, he needs to be monitoring himself for any change in status which would warrant reconsideration of urgent evaluation. He verbalized understanding and gratitude.  Statia Burdick N Kashlyn Salinas, PA-C

## 2023-11-04 ENCOUNTER — Other Ambulatory Visit: Payer: Self-pay

## 2023-11-04 DIAGNOSIS — G4733 Obstructive sleep apnea (adult) (pediatric): Secondary | ICD-10-CM | POA: Diagnosis not present

## 2023-11-06 ENCOUNTER — Other Ambulatory Visit: Payer: Self-pay

## 2023-11-09 ENCOUNTER — Other Ambulatory Visit: Payer: Self-pay | Admitting: Family

## 2023-11-10 ENCOUNTER — Other Ambulatory Visit: Payer: Self-pay

## 2023-11-11 ENCOUNTER — Other Ambulatory Visit: Payer: Self-pay

## 2023-11-12 ENCOUNTER — Other Ambulatory Visit (HOSPITAL_COMMUNITY): Payer: Self-pay

## 2023-11-13 ENCOUNTER — Other Ambulatory Visit (HOSPITAL_COMMUNITY): Payer: Self-pay

## 2023-11-13 DIAGNOSIS — H9201 Otalgia, right ear: Secondary | ICD-10-CM | POA: Diagnosis not present

## 2023-11-14 ENCOUNTER — Other Ambulatory Visit (HOSPITAL_COMMUNITY): Payer: Self-pay

## 2023-11-14 ENCOUNTER — Other Ambulatory Visit: Payer: Self-pay

## 2023-11-14 ENCOUNTER — Other Ambulatory Visit: Payer: Self-pay | Admitting: Family

## 2023-11-14 MED ORDER — MOUNJARO 12.5 MG/0.5ML ~~LOC~~ SOAJ
12.5000 mg | SUBCUTANEOUS | 1 refills | Status: DC
  Filled 2023-11-14: qty 2, 28d supply, fill #0

## 2023-11-14 NOTE — Telephone Encounter (Signed)
 Copied from CRM [id number]. Topic: Clinical - Prescription Issue >> Nov 14, 2023 10:16 AM Chiquita SQUIBB wrote: Reason for CRM: Patient is calling in regarding the tirzepatide  (MOUNJARO ) 12.5 MG/0.5ML Pen [[id number]], patient needs his dose for Sunday so he needs this sent in today. Please advise patient.

## 2023-11-18 ENCOUNTER — Other Ambulatory Visit: Payer: Self-pay

## 2023-11-18 ENCOUNTER — Other Ambulatory Visit (HOSPITAL_COMMUNITY): Payer: Self-pay

## 2023-11-18 NOTE — Progress Notes (Signed)
 Specialty Pharmacy Refill Coordination Note  Julian Washington is a 43 y.o. male contacted today regarding refills of specialty medication(s) Risankizumab -rzaa (Skyrizi  Pen)   Patient requested [name] at [facility] at [place] date: 12/04/23   Medication will be filled on 10.21.25.   Injection due 10.24.25

## 2023-11-18 NOTE — Progress Notes (Signed)
 Specialty Pharmacy Ongoing Clinical Assessment Note  BASCOM BIEL is a 43 y.o. male who is being followed by the specialty pharmacy service for RxSp Psoriasis   Patient's specialty medication(s) reviewed today: Risankizumab -rzaa (Skyrizi  Pen)   Missed doses in the last 4 weeks: 0   Patient/Caregiver did not have any additional questions or concerns.   Therapeutic benefit summary: Patient is achieving benefit   Adverse events/side effects summary: No adverse events/side effects   Patient's therapy is appropriate to: Continue    Goals Addressed             This Visit's Progress    Minimize recurrence of flares   On track    Patient is on track. Patient will maintain adherence.  Patient reports that he is overall mostly well-controlled with a couple of spots that pop up occasionally. Stated that the spots usually resolve on their own without any creams.         Follow up: 12 months  [facility]

## 2023-11-19 ENCOUNTER — Other Ambulatory Visit: Payer: Self-pay

## 2023-11-21 ENCOUNTER — Other Ambulatory Visit: Payer: Self-pay | Admitting: *Deleted

## 2023-11-21 DIAGNOSIS — Z006 Encounter for examination for normal comparison and control in clinical research program: Secondary | ICD-10-CM

## 2023-11-24 ENCOUNTER — Other Ambulatory Visit (HOSPITAL_COMMUNITY): Payer: Self-pay

## 2023-11-24 DIAGNOSIS — Z79899 Other long term (current) drug therapy: Secondary | ICD-10-CM | POA: Diagnosis not present

## 2023-11-24 DIAGNOSIS — I872 Venous insufficiency (chronic) (peripheral): Secondary | ICD-10-CM | POA: Diagnosis not present

## 2023-11-24 DIAGNOSIS — L308 Other specified dermatitis: Secondary | ICD-10-CM | POA: Diagnosis not present

## 2023-11-24 DIAGNOSIS — L400 Psoriasis vulgaris: Secondary | ICD-10-CM | POA: Diagnosis not present

## 2023-11-24 MED ORDER — MUPIROCIN 2 % EX OINT
1.0000 | TOPICAL_OINTMENT | Freq: Two times a day (BID) | CUTANEOUS | 3 refills | Status: AC
  Filled 2023-11-24: qty 22, 11d supply, fill #0

## 2023-11-24 MED ORDER — TRIAMCINOLONE ACETONIDE 0.1 % EX CREA
1.0000 | TOPICAL_CREAM | Freq: Two times a day (BID) | CUTANEOUS | 1 refills | Status: AC
  Filled 2023-11-24: qty 454, 90d supply, fill #0

## 2023-12-02 ENCOUNTER — Other Ambulatory Visit: Payer: Self-pay

## 2023-12-03 DIAGNOSIS — Z79899 Other long term (current) drug therapy: Secondary | ICD-10-CM | POA: Diagnosis not present

## 2023-12-08 ENCOUNTER — Other Ambulatory Visit: Payer: Self-pay

## 2023-12-08 ENCOUNTER — Other Ambulatory Visit (HOSPITAL_COMMUNITY): Payer: Self-pay

## 2023-12-08 ENCOUNTER — Other Ambulatory Visit: Payer: Self-pay | Admitting: Pharmacist

## 2023-12-08 MED ORDER — SKYRIZI PEN 150 MG/ML ~~LOC~~ SOAJ
SUBCUTANEOUS | 3 refills | Status: AC
  Filled 2023-12-08: qty 1, fill #0
  Filled 2024-02-18: qty 1, 84d supply, fill #0

## 2023-12-08 MED ORDER — SKYRIZI PEN 150 MG/ML ~~LOC~~ SOAJ: SUBCUTANEOUS | 3 refills | Status: DC

## 2023-12-09 ENCOUNTER — Other Ambulatory Visit (HOSPITAL_COMMUNITY): Payer: Self-pay

## 2023-12-10 ENCOUNTER — Ambulatory Visit: Admitting: Internal Medicine

## 2023-12-10 ENCOUNTER — Other Ambulatory Visit (HOSPITAL_COMMUNITY): Payer: Self-pay

## 2023-12-10 ENCOUNTER — Encounter: Payer: Self-pay | Admitting: Internal Medicine

## 2023-12-10 VITALS — BP 138/86 | HR 73 | Temp 97.9°F | Resp 18 | Ht 71.0 in | Wt 372.0 lb

## 2023-12-10 DIAGNOSIS — E785 Hyperlipidemia, unspecified: Secondary | ICD-10-CM | POA: Diagnosis not present

## 2023-12-10 DIAGNOSIS — Z0000 Encounter for general adult medical examination without abnormal findings: Secondary | ICD-10-CM

## 2023-12-10 DIAGNOSIS — Z7985 Long-term (current) use of injectable non-insulin antidiabetic drugs: Secondary | ICD-10-CM | POA: Diagnosis not present

## 2023-12-10 DIAGNOSIS — L409 Psoriasis, unspecified: Secondary | ICD-10-CM

## 2023-12-10 DIAGNOSIS — I10 Essential (primary) hypertension: Secondary | ICD-10-CM

## 2023-12-10 DIAGNOSIS — Z0001 Encounter for general adult medical examination with abnormal findings: Secondary | ICD-10-CM

## 2023-12-10 DIAGNOSIS — E538 Deficiency of other specified B group vitamins: Secondary | ICD-10-CM

## 2023-12-10 DIAGNOSIS — I480 Paroxysmal atrial fibrillation: Secondary | ICD-10-CM

## 2023-12-10 DIAGNOSIS — E559 Vitamin D deficiency, unspecified: Secondary | ICD-10-CM | POA: Diagnosis not present

## 2023-12-10 DIAGNOSIS — E119 Type 2 diabetes mellitus without complications: Secondary | ICD-10-CM | POA: Diagnosis not present

## 2023-12-10 LAB — CBC WITH DIFFERENTIAL/PLATELET
Basophils Absolute: 0 K/uL (ref 0.0–0.1)
Basophils Relative: 0.6 % (ref 0.0–3.0)
Eosinophils Absolute: 0.1 K/uL (ref 0.0–0.7)
Eosinophils Relative: 1.6 % (ref 0.0–5.0)
HCT: 38.2 % — ABNORMAL LOW (ref 39.0–52.0)
Hemoglobin: 12.8 g/dL — ABNORMAL LOW (ref 13.0–17.0)
Lymphocytes Relative: 25.6 % (ref 12.0–46.0)
Lymphs Abs: 1.9 K/uL (ref 0.7–4.0)
MCHC: 33.4 g/dL (ref 30.0–36.0)
MCV: 80.1 fl (ref 78.0–100.0)
Monocytes Absolute: 0.4 K/uL (ref 0.1–1.0)
Monocytes Relative: 4.9 % (ref 3.0–12.0)
Neutro Abs: 5.1 K/uL (ref 1.4–7.7)
Neutrophils Relative %: 67.3 % (ref 43.0–77.0)
Platelets: 188 K/uL (ref 150.0–400.0)
RBC: 4.78 Mil/uL (ref 4.22–5.81)
RDW: 13.8 % (ref 11.5–15.5)
WBC: 7.6 K/uL (ref 4.0–10.5)

## 2023-12-10 LAB — MICROALBUMIN / CREATININE URINE RATIO
Creatinine,U: 104.3 mg/dL
Microalb Creat Ratio: 39.3 mg/g — ABNORMAL HIGH (ref 0.0–30.0)
Microalb, Ur: 4.1 mg/dL — ABNORMAL HIGH (ref 0.0–1.9)

## 2023-12-10 LAB — BASIC METABOLIC PANEL WITH GFR
BUN: 14 mg/dL (ref 6–23)
CO2: 29 meq/L (ref 19–32)
Calcium: 8.9 mg/dL (ref 8.4–10.5)
Chloride: 103 meq/L (ref 96–112)
Creatinine, Ser: 0.72 mg/dL (ref 0.40–1.50)
GFR: 112.54 mL/min (ref >60.00)
Glucose, Bld: 92 mg/dL (ref 70–99)
Potassium: 4.6 meq/L (ref 3.5–5.1)
Sodium: 138 meq/L (ref 135–145)

## 2023-12-10 LAB — B12 AND FOLATE PANEL
Folate: >23.7 ng/mL (ref >5.9)
Vitamin B-12: 299 pg/mL (ref 211–911)

## 2023-12-10 LAB — HEMOGLOBIN A1C: Hgb A1c MFr Bld: 6.1 % (ref 4.6–6.5)

## 2023-12-10 LAB — MAGNESIUM: Magnesium: 1.9 mg/dL (ref 1.5–2.5)

## 2023-12-10 LAB — VITAMIN D 25 HYDROXY (VIT D DEFICIENCY, FRACTURES): VITD: 43.78 ng/mL (ref 30.00–100.00)

## 2023-12-10 MED ORDER — MOUNJARO 12.5 MG/0.5ML ~~LOC~~ SOAJ
12.5000 mg | SUBCUTANEOUS | 0 refills | Status: DC
  Filled 2023-12-10: qty 6, 84d supply, fill #0

## 2023-12-10 NOTE — Assessment & Plan Note (Signed)
 Here for CPX -Tdap 2023 - PNM 23: 10/2018 - allergic to flu shot  -COVID vaccine-- if pt elects to get another shot needs to be @ allergist office d/t a h/o a reaction. - CCS: no FH . Never had a cscope   -Labs:   See orders

## 2023-12-10 NOTE — Assessment & Plan Note (Signed)
 Here for CPX  Other issues: DM type II ( w/ OSA, HTN, stasis dermatitis) Diabetes well-controlled with metformin  and tirzepatide . Last A1c 6.2. - Refill tirzepatide  (Mounjaro ) for 90 days. - Order A1c and microalbumin tests. Morbid obesity Morbid obesity with planned bariatric surgery. Weight loss noted. - Encourage continued weight loss efforts. - Refer to pharmacist for potential transition of medications to liquid form post-surgery. HTN: On losartan , BP within range, check BMP. Hyperlipidemia Hyperlipidemia managed with rosuvastatin . Cholesterol levels good. Paroxysmal atrial fibrillation Cardiology note 08/01/2023, noted to be doing well on flecainide .  Rec to continue Eliquis  and beta-blockers Also, cardiology okay to hold Eliquis  2 to 3 days prior to procedures and restart per the surgical team..  Recent had palpitations with PACs --resolved.  Plans to communicate with cardiology.   Psoriasis Psoriasis managed with topical treatments and Skyrizi , improving. Vitamin D  deficiency Vitamin D  deficiency managed with 4000 units daily.  Labs B12 deficiency: On MVI, check labs History of pituitary prolactinoma, status post resection follow-up by Endo annually RTC around February 2026.

## 2023-12-10 NOTE — Patient Instructions (Addendum)
 GO TO THE LAB :  Get the blood work    Then, go to the front desk for the checkout Please make an appointment for a checkup, February 2026  Continue checking your blood pressure regularly Blood pressure goal:  between 110/65 and  135/85. If it is consistently higher or lower, let me know  Diabetes  You can check your sugars at different times  - early in AM fasting  ( blood sugar goal 70-130) - 2 hours after a meal (blood sugar goal less than 180)     YOUR PLAN (AI generated)    TYPE 2 DIABETES MELLITUS: Diabetes well-controlled with metformin  and tirzepatide . Last A1c 6.2. -Refilled tirzepatide  (Mounjaro ) for 90 days. -Ordered A1c and microalbumin tests.  MORBID OBESITY: Morbid obesity with planned bariatric surgery. Weight loss noted. -Continue your weight loss efforts. -Referred you to a pharmacist for potential transition of medications to liquid form post-surgery.  ESSENTIAL HYPERTENSION: Hypertension managed with losartan . Blood pressures within range. -Ordered BMP and magnesium     HYPERLIPIDEMIA: Hyperlipidemia managed with rosuvastatin . Cholesterol levels good. -Continue taking rosuvastatin  as prescribed.  PAROXYSMAL ATRIAL FIBRILLATION: Atrial fibrillation managed with Eliquis , flecainide , and metoprolol . Recent palpitations with PACs resolved. -Sent a message to cardiology with your watch strip showing PACs.  PSORIASIS: Psoriasis managed with topical treatments and Skyrizi . Wounds improving. -Continue using your current treatments.  VITAMIN D  DEFICIENCY: Vitamin D  deficiency managed with 4000 units daily. -Ordered vitamin D  level.  VITAMIN B12 DEFICIENCY: Vitamin B12 deficiency managed with multivitamin. -Ordered vitamin B12 level.  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 Cautious about vaccines. -Any future flu vaccines should be administered at your allergist's office.  PITUITARY PROLACTINOMA, STATUS POST RESECTION:  Pituitary prolactinoma, status post resection. Monitored by endocrinologist annually. -Continue annual follow-ups with your endocrinologist.

## 2023-12-10 NOTE — Progress Notes (Signed)
 Subjective:    Patient ID: Julian Washington, male    DOB: [birth year], 43 y.o.   MRN: [id number]  DOS:  12/10/2023 CPX  Discussed the use of AI scribe software for clinical note transcription with the patient, who gave verbal consent to proceed.  History of Present Illness  Cardiac symptoms - Experienced palpitations last Friday with 'throwing couplets, PACs' detected on his watch - Palpitations have resolved - No chest pain, dyspnea, or palpitations currently - No nausea, vomiting, diarrhea, constipation, hematuria, or melena - Currently taking Eliquis , flecainide , and metoprolol   Diabetes mellitus  - Does not regularly check blood glucose at home - Last hemoglobin A1c was 6.2 - Currently taking metformin  and Mounjaro  12.5 mg weekly  Obesity and bariatric surgery preparation - Scheduled for bariatric surgery in mid-January  Dyslipidemia and hypertension - Currently taking rosuvastatin  and losartan   Vitamin deficiencies - History of vitamin B12 deficiency - Currently taking a multivitamin but not vitamin B12 supplements  Pituitary prolactinoma - History of pituitary prolactinoma with prior surgical intervention - Sees endocrinologist annually for follow-up  Immunization discussed today.    Review of Systems  Other than above, a 14 point review of systems is negative     Past Medical History:  Diagnosis Date   ADHD    Anemia    Anxiety and depression    h/o suicidality w/ citalopram   Atrial fibrillation (HCC)    Diabetes mellitus without complication (HCC)    Difficult intubation    ETD (eustachian tube dysfunction)    s/p ENT, declined ear tuves before    GERD (gastroesophageal reflux disease)    History of chicken pox    Titered on 08/16/2005   Hypertension    Hypogonadism male    Dr Trixie, used to see urology   Infertility male    Internal hemorrhoids    s/p banding   Low testosterone     Migraine    on topamax    OSA on CPAP    Palpitations     Pituitary mass    h/o increased prolactin, Dr Trixie (previously @ [facility])   Prediabetes    A1C 6.2 years ago   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   Vitamin B 12 deficiency    h/o   Vitamin D  deficiency     Past Surgical History:  Procedure Laterality Date   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-04-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, ACTH    TONSILLECTOMY AND ADENOIDECTOMY      Current Outpatient Medications  Medication Instructions   augmented betamethasone  dipropionate (DIPROLENE -AF) 0.05 % cream Apply a small amount to affected area twice a day   azelastine  (ASTELIN ) 0.1 % nasal spray 2 sprays, Each Nare, 2 times daily, Use in each nostril as directed   Cholecalciferol (VITAMIN D3) 125 MCG (5000 UT) TABS 1 tablet, 3 times weekly   cyclobenzaprine  (FLEXERIL ) 10 mg, Oral, Daily at bedtime   Eliquis  5 mg, Oral, 2 times daily   fexofenadine (ALLEGRA) 180 mg, Daily   flecainide  (TAMBOCOR ) 100 mg, Oral, Every 12 hours   fluticasone  (FLONASE ) 50 MCG/ACT nasal spray 2 sprays, Each Nare, Daily   losartan  (COZAAR ) 100 mg, Oral, Daily   Magnesium  Oxide (MAG-OXIDE PO) 400 mg, Daily at bedtime   metFORMIN  (GLUCOPHAGE ) 1,000 mg, Oral, 2 times daily with meals   metoprolol  tartrate (LOPRESSOR ) 50 mg, Oral, 2 times daily   Mounjaro  12.5 mg, Subcutaneous, Weekly   Multiple Vitamin (MULTIVITAMIN WITH MINERALS) TABS tablet 1 tablet,  Daily   mupirocin ointment (BACTROBAN) 2 % Apply liberally to affected area twice a day as directed   omeprazole  (PRILOSEC) 40 mg, Oral, Daily before breakfast   risankizumab -rzaa (SKYRIZI  PEN) 150 MG/ML pen Inject 1 pen injector subcutaneously every three months   rosuvastatin  (CRESTOR ) 10 mg, Oral, Daily at bedtime   triamcinolone  cream (KENALOG ) 0.1 % Apply a small amount to skin to on lower legs twice a day for 2-3 weeks at a time.       Objective:   Physical Exam BP 138/86   Pulse 73   Temp 97.9 F (36.6 C) (Oral)   Resp 18   Ht 5' 11 (1.803 m)   Wt (!) 372  lb (168.7 kg)   SpO2 99%   BMI 51.88 kg/m  General: Well developed, NAD, BMI noted Neck: No  thyromegaly  HEENT:  Normocephalic . Face symmetric, atraumatic Lungs:  CTA B Normal respiratory effort, no intercostal retractions, no accessory muscle use. Heart: RRR,  no murmur.  Abdomen:  Not distended, soft, non-tender. No rebound or rigidity.   Lower extremities: Erythema at the distal lower extremities consistent with previous history of stasis dermatitis.  At baseline Neurologic:  alert & oriented X3.  Speech normal, gait appropriate for age and unassisted Strength symmetric and appropriate for age.  Psych: Cognition and judgment appear intact.  Cooperative with normal attention span and concentration.  Behavior appropriate. No anxious or depressed appearing.     Assessment   Assessment   DM HTN High chol Chronic venous insufficiency/lymphedema/stasis dermatitis PSYCH: --Anxiety depression (suicidality w/  Citalopram) --ADHD Dx 2019, Dr Elouise Morbid obesity Vitamin D  and  B12 deficiency CV: New onset A. fib 11-2020.  Admitted  Calcium  coronary score 0 (11-2020) Endocrinology:Dr Gherghe --Pituitary mass, surgery : Prolactinoma --Hypogonadism hypogonadotropic --Infertility Migraines, d/c Topamax  05-2015 (pt concerned about short term memory)  OSA on CPAP -- DR Shellia Hematology -Mild chronic anemia, no previous colonoscopy or EGD. Iron , vitamins normal.  saw hematology 09/2017  -Splenomegaly: Per CT 09/20/2017, saw hematology --Abdominal lymphadenopathies: Felt to be stable, see hematology note from July 14, 2019. Pulmonary nodules: Felt to be stable per last CT. DERM: Psoriasis  H/o ET  dysfunction, declined ear tubes H/o hemorrhoids s/p  banding H/o abd pain 2012 (w/u CT, US , a HIDA scan), similar sx 02-2015 H/o  difficulty intubation   Assessment & Plan  Here for CPX -Tdap 2023 - PNM 23: 10/2018 - allergic to flu shot  -COVID vaccine-- if pt elects to get  another shot needs to be @ allergist office d/t a h/o a reaction. - CCS: no FH . Never had a cscope   -Labs:   See orders  Other issues: DM type II ( w/ OSA, HTN, stasis dermatitis) Diabetes well-controlled with metformin  and tirzepatide . Last A1c 6.2. - Refill tirzepatide  (Mounjaro ) for 90 days. - Order A1c and microalbumin tests. Morbid obesity Morbid obesity with planned bariatric surgery. Weight loss noted. - Encourage continued weight loss efforts. - Refer to pharmacist for potential transition of medications to liquid form post-surgery. HTN: On losartan , BP within range, check BMP. Hyperlipidemia Hyperlipidemia managed with rosuvastatin . Cholesterol levels good. Paroxysmal atrial fibrillation Cardiology note 08/01/2023, noted to be doing well on flecainide .  Rec to continue Eliquis  and beta-blockers Also, cardiology okay to hold Eliquis  2 to 3 days prior to procedures and restart per the surgical team..  Recent had palpitations with PACs --resolved.  Plans to communicate with cardiology.   Psoriasis Psoriasis managed with  topical treatments and Skyrizi , improving. Vitamin D  deficiency Vitamin D  deficiency managed with 4000 units daily.  Labs B12 deficiency: On MVI, check labs History of pituitary prolactinoma, status post resection follow-up by Endo annually RTC around February 2026.

## 2023-12-12 ENCOUNTER — Ambulatory Visit: Payer: Self-pay | Admitting: Internal Medicine

## 2023-12-15 ENCOUNTER — Telehealth: Payer: Self-pay

## 2023-12-15 NOTE — Progress Notes (Signed)
 Complex Care Management Note Care Guide Note  12/15/2023 Name: Julian Washington MRN: [id number] DOB: 07/16/1980   Complex Care Management Outreach Attempts: An unsuccessful telephone outreach was attempted today to offer the patient information about available complex care management services.  Follow Up Plan:  Additional outreach attempts will be made to offer the patient complex care management information and services.   Encounter Outcome:  Patient Request to Call Back  Dreama [facility]  [facility], [facility]BCI Assistant Direct Dial: [phone]  Fax: [phone]

## 2023-12-16 ENCOUNTER — Ambulatory Visit (INDEPENDENT_AMBULATORY_CARE_PROVIDER_SITE_OTHER): Admitting: Licensed Clinical Social Worker

## 2023-12-16 DIAGNOSIS — F4320 Adjustment disorder, unspecified: Secondary | ICD-10-CM | POA: Diagnosis not present
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
## 2023-12-18 NOTE — Progress Notes (Signed)
 Complex Care Management Note Care Guide Note  12/18/2023 Name: Julian Washington MRN: [id number] DOB: 04-01-1980   Complex Care Management Outreach Attempts: A second unsuccessful outreach was attempted today to offer the patient with information about available complex care management services.  Follow Up Plan:  Additional outreach attempts will be made to offer the patient complex care management information and services.   Encounter Outcome:  No Answer  [facility]  [place], [facility] VBCI Assistant Direct Dial: [phone]  Fax: [phone]

## 2023-12-22 NOTE — Progress Notes (Signed)
 Complex Care Management Note Care Guide Note  12/22/2023 Name: Julian Washington MRN: [id number] DOB: [birth year]   Complex Care Management Outreach Attempts: A third unsuccessful outreach was attempted today to offer the patient with information about available complex care management services.  Follow Up Plan:  No further outreach attempts will be made at this time. We have been unable to contact the patient to offer or enroll patient in complex care management services.  Encounter Outcome:  No Answer  [facility]  [facility], [facility] VBCI Assistant Direct Dial: [phone]  Fax: [phone]

## 2023-12-22 NOTE — Progress Notes (Signed)
 Complex Care Management Note  Care Guide Note 12/22/2023 Name: Julian Washington MRN: [id number] DOB: 1981-01-08  Julian Washington is a 43 y.o. year old male who sees Amon, Aloysius BRAVO, MD for primary care. I reached out to Julian Washington by phone today to offer complex care management services.  Mr. Chew was given information about Complex Care Management services today including:   The Complex Care Management services include support from the care team which includes your Nurse Care Manager, Clinical Social Worker, or Pharmacist.  The Complex Care Management team is here to help remove barriers to the health concerns and goals most important to you. Complex Care Management services are voluntary, and the patient may decline or stop services at any time by request to their care team member.   Complex Care Management Consent Status: Patient did not agree to participate in complex care management services at this time.  Follow up plan:  Patient will follow up with specialist.  Encounter Outcome:  Patient Refused  [facility]  [facility], [facility] VBCI Assistant Direct Dial: [phone]  Fax: [phone]

## 2024-01-05 ENCOUNTER — Other Ambulatory Visit (HOSPITAL_COMMUNITY): Payer: Self-pay

## 2024-01-11 ENCOUNTER — Other Ambulatory Visit: Payer: Self-pay | Admitting: Cardiology

## 2024-01-11 DIAGNOSIS — I480 Paroxysmal atrial fibrillation: Secondary | ICD-10-CM

## 2024-01-12 ENCOUNTER — Other Ambulatory Visit (HOSPITAL_COMMUNITY): Payer: Self-pay

## 2024-01-12 ENCOUNTER — Other Ambulatory Visit: Payer: Self-pay

## 2024-01-12 MED ORDER — APIXABAN 5 MG PO TABS
5.0000 mg | ORAL_TABLET | Freq: Two times a day (BID) | ORAL | 3 refills | Status: AC
  Filled 2024-01-12 – 2024-01-14 (×3): qty 180, 90d supply, fill #0

## 2024-01-12 MED ORDER — LOSARTAN POTASSIUM 100 MG PO TABS
100.0000 mg | ORAL_TABLET | Freq: Every day | ORAL | 3 refills | Status: AC
  Filled 2024-01-12: qty 90, 90d supply, fill #0

## 2024-01-12 NOTE — Telephone Encounter (Signed)
 Prescription refill request for Eliquis  received. Indication: a fib Last office visit: 08/01/23 Scr: 0.72 epic 12/10/23 Age: 43 Weight: 168kg

## 2024-01-14 ENCOUNTER — Other Ambulatory Visit: Payer: Self-pay

## 2024-01-14 ENCOUNTER — Other Ambulatory Visit: Payer: Self-pay | Admitting: Cardiology

## 2024-01-14 ENCOUNTER — Other Ambulatory Visit (HOSPITAL_COMMUNITY): Payer: Self-pay

## 2024-01-15 ENCOUNTER — Other Ambulatory Visit (HOSPITAL_COMMUNITY): Payer: Self-pay

## 2024-01-16 ENCOUNTER — Other Ambulatory Visit: Payer: Self-pay

## 2024-01-20 ENCOUNTER — Other Ambulatory Visit (HOSPITAL_COMMUNITY): Payer: Self-pay

## 2024-01-20 ENCOUNTER — Encounter: Payer: Self-pay | Admitting: Cardiology

## 2024-01-20 ENCOUNTER — Other Ambulatory Visit: Payer: Self-pay

## 2024-01-20 ENCOUNTER — Ambulatory Visit: Attending: Cardiology | Admitting: Cardiology

## 2024-01-20 VITALS — BP 146/96 | HR 82 | Ht 71.0 in | Wt 380.4 lb

## 2024-01-20 DIAGNOSIS — I480 Paroxysmal atrial fibrillation: Secondary | ICD-10-CM | POA: Diagnosis not present

## 2024-01-20 DIAGNOSIS — Z79899 Other long term (current) drug therapy: Secondary | ICD-10-CM | POA: Diagnosis not present

## 2024-01-20 DIAGNOSIS — I4891 Unspecified atrial fibrillation: Secondary | ICD-10-CM | POA: Diagnosis not present

## 2024-01-20 DIAGNOSIS — I10 Essential (primary) hypertension: Secondary | ICD-10-CM

## 2024-01-20 MED ORDER — METOPROLOL TARTRATE 50 MG PO TABS
50.0000 mg | ORAL_TABLET | Freq: Two times a day (BID) | ORAL | 1 refills | Status: DC
  Filled 2024-01-20 – 2024-02-07 (×2): qty 180, 90d supply, fill #0

## 2024-01-20 NOTE — Progress Notes (Signed)
  Electrophysiology Office Follow up Visit Note:    Date:  01/20/2024   ID:  Julian Washington, DOB Oct 04, 1980, MRN [id number]  PCP:  Amon Aloysius BRAVO, MD  [facility] Cardiologist:  OLE ONEIDA HOLTS, MD  [facility] Electrophysiologist:  None    Interval History:     Julian Washington is a 43 y.o. male who presents for a follow up visit.   I last saw the patient August 01, 2023.  He has paroxysmal atrial fibrillation on flecainide . He is doing well.  No sustained arrhythmias.  He did have some episodes of palpitations and was able to capture on his wearable monitor.  This showed bigeminal PACs.        Past medical, surgical, social and family history were reviewed.  ROS:   Please see the history of present illness.    All other systems reviewed and are negative.  EKGs/Labs/Other Studies Reviewed:    The following studies were reviewed today:     EKG Interpretation Date/Time:  Tuesday January 20 2024 13:49:31 EST Ventricular Rate:  82 PR Interval:  146 QRS Duration:  106 QT Interval:  398 QTC Calculation: 464 R Axis:   -6  Text Interpretation: Normal sinus rhythm Confirmed by Holts Ole [id number]) on 01/20/2024 1:55:39 PM    Physical Exam:    VS:  BP (!) 146/96   Pulse 82   Ht 5' 11 (1.803 m)   Wt (!) 380 lb 6.4 oz (172.5 kg)   SpO2 99%   BMI 53.06 kg/m     Wt Readings from Last 3 Encounters:  01/20/24 (!) 380 lb 6.4 oz (172.5 kg)  12/10/23 (!) 372 lb (168.7 kg)  11/03/23 (!) 382 lb 6.4 oz (173.5 kg)     GEN: no distress CARD: RRR, No MRG RESP: No IWOB. CTAB      ASSESSMENT:    1. Paroxysmal atrial fibrillation (HCC)   2. Encounter for long-term (current) use of high-risk medication   3. Primary hypertension   4. Atrial fibrillation with RVR (HCC)    PLAN:    In order of problems listed above:  #Paroxysmal atrial fibrillation #High risk medication monitoring-Flecainide  Doing well on flecainide . PR and QRS intervals stable and acceptable  for ongoing flecainide  use Continue Eliquis  for stroke prophylaxis  I discussed my upcoming departure from [facility] during today's clinic appointment.  The patient will continue to follow-up with one of my EP partners moving forward.  Follow-up 6 months with EP APP   Signed, Ole Holts, MD, [facility], [facility] 01/20/2024 1:55 PM    Electrophysiology [facility]

## 2024-01-26 ENCOUNTER — Encounter: Attending: General Surgery | Admitting: Dietician

## 2024-01-26 ENCOUNTER — Encounter: Payer: Self-pay | Admitting: Dietician

## 2024-01-26 VITALS — Ht 69.5 in | Wt 374.6 lb

## 2024-01-26 DIAGNOSIS — E669 Obesity, unspecified: Secondary | ICD-10-CM

## 2024-01-26 DIAGNOSIS — E119 Type 2 diabetes mellitus without complications: Secondary | ICD-10-CM | POA: Insufficient documentation

## 2024-01-26 NOTE — Progress Notes (Signed)
 Pre-Operative Nutrition Class:    Class start Time: 1715   Class End Time: 1820  This was a class of 3 patients.   Patient was seen on 01/26/2024 for Pre-Operative Bariatric Surgery Education at the [facility].    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 No  Samples given per MNT protocol. Patient educated on appropriate usage: Celebrate Vitamins Multivitamin Lot # [id number] Exp: 09/26  Celebrate Vitamins Calcium   Lot # [id number] Exp: 12/25  Ensure Max Protein Shake Lot # [id number] Exp: 1JUL 2026  The following the learning objectives were met by the patient during this course: Identify Pre-Op Dietary Goals and will begin 2 weeks pre-operatively Identify appropriate sources of fluids and proteins  State protein recommendations and appropriate sources pre and post-operatively Identify Post-Operative Dietary Goals and will follow for 2 weeks post-operatively Identify appropriate multivitamin, calcium , and thiamin sources Describe the need for physical activity post-operatively and will follow MD recommendations State when to call healthcare provider regarding medication questions or post-operative complications When having a diagnosis of diabetes understanding hypoglycemia symptoms and the inclusion of 1 complex carbohydrate per meal  Handouts given during class include: Pre-Op Bariatric Surgery Diet Handout Protein Shake Handout Post-Op Bariatric Surgery  Nutrition Handout BELT Program Information Flyer [employer] Information Flyer [facility] Bariatric Supplements Price List  Follow-Up Plan: Patient will follow-up at [facility] 2 weeks post operatively for diet advancement per MD.

## 2024-01-27 ENCOUNTER — Ambulatory Visit: Payer: Commercial Managed Care - PPO | Admitting: Internal Medicine

## 2024-01-28 DIAGNOSIS — K760 Fatty (change of) liver, not elsewhere classified: Secondary | ICD-10-CM | POA: Diagnosis not present

## 2024-01-28 DIAGNOSIS — I10 Essential (primary) hypertension: Secondary | ICD-10-CM | POA: Diagnosis not present

## 2024-01-28 DIAGNOSIS — E785 Hyperlipidemia, unspecified: Secondary | ICD-10-CM | POA: Diagnosis not present

## 2024-01-28 DIAGNOSIS — I480 Paroxysmal atrial fibrillation: Secondary | ICD-10-CM | POA: Diagnosis not present

## 2024-01-28 DIAGNOSIS — K219 Gastro-esophageal reflux disease without esophagitis: Secondary | ICD-10-CM | POA: Diagnosis not present

## 2024-01-28 DIAGNOSIS — E119 Type 2 diabetes mellitus without complications: Secondary | ICD-10-CM | POA: Diagnosis not present

## 2024-01-28 DIAGNOSIS — I872 Venous insufficiency (chronic) (peripheral): Secondary | ICD-10-CM | POA: Diagnosis not present

## 2024-01-28 DIAGNOSIS — Z6841 Body Mass Index (BMI) 40.0 and over, adult: Secondary | ICD-10-CM | POA: Diagnosis not present

## 2024-01-28 DIAGNOSIS — G4733 Obstructive sleep apnea (adult) (pediatric): Secondary | ICD-10-CM | POA: Diagnosis not present

## 2024-01-30 ENCOUNTER — Ambulatory Visit: Admitting: Pulmonary Disease

## 2024-01-30 ENCOUNTER — Other Ambulatory Visit

## 2024-01-30 ENCOUNTER — Ambulatory Visit: Admitting: Internal Medicine

## 2024-01-30 ENCOUNTER — Encounter: Payer: Self-pay | Admitting: Internal Medicine

## 2024-01-30 ENCOUNTER — Other Ambulatory Visit: Payer: Self-pay

## 2024-01-30 VITALS — BP 120/70 | HR 88 | Ht 69.5 in | Wt 375.0 lb

## 2024-01-30 DIAGNOSIS — E230 Hypopituitarism: Secondary | ICD-10-CM

## 2024-01-30 DIAGNOSIS — Z87898 Personal history of other specified conditions: Secondary | ICD-10-CM | POA: Diagnosis not present

## 2024-01-30 DIAGNOSIS — E66813 Obesity, class 3: Secondary | ICD-10-CM | POA: Diagnosis not present

## 2024-01-30 NOTE — Progress Notes (Signed)
 Patient ID: Julian Washington, male   DOB: August 14, 1980, 43 y.o.   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  Last visit a year ago.  His wife, Julian Washington, is also my patient.  Interim history: No increased urination, blurry vision, nausea. At today's visit, he does not report visual disturbances.  He had headaches in 10/2021, for which he had a CT scan -no acute abnormalities. He saw urology (Dr. Lovie - also fertility management) >> started Xyosted  weekly and Arimidex  - but stopped 2/2 HAs.  After last visit, he tried to start po testosterone  per urology but had to stop 2/2 HA and increased BP.  His headaches are greatly improved now. He will have R en Y sx 02/23/2024.  After the first of the year, he will start the presurgical diet.  History of pituitary adenoma: Reviewed and addended history: The patient has been diagnosed with hypogonadotropic hypogonadism ~2012, after trying to achieve a pregnancy. He tried testosterone  replacement and clomiphene (which did not work), a cousin who is a medical resident suggested to have further investigation to find out the cause for his hypogonadism.   His urologist at that time ordered a pituitary MRI and he has been found to have a pituitary microadenoma on MRI in 12/2011. He was referred to neurosurgery - Dr Julian Washington - and he ended up having TSR in 02/2012. Surgical pathology showed:  The sections show monomorphic nests of neoplastic cells with neuroendocrine nuclear features, consistent with a pituitary adenoma. The tumor is strongly and diffusely immunoreactive for prolactin (100% of cells). There is patchy staining for adrenocorticotropic hormone (less than 5% of cells). The cells are negative for TSH, HGH, FSH, and beta-LH.   His highest prolactin level was 44.7 before the surgery. This has decreased after the surgery.  I  reviewed patient's pertinent labs per records from  Dr. Ailene at [facility] and also in Care Everywhere: 11/2010: TSH 2 02/2011: Testosterone  0.89, free testosterone  2.2 (9.3-26.5) 11/17/2011: TSH 2.35, FSH 2.08, LH 1.44, testosterone  0.08 (2.41-8.27) 12/17/2011: Prolactin 44.7, estradiol  23, cortisol 2.9, hemoglobin A1c 6.2% 02/19/2012: TSH 1.9, total T3 106, free T4 1, hemoglobin A1c 5.8% 03/05/2012: Prolactin 1.4, cortisol 37.2, hemoglobin A1c 5.6%  04/02/2012: Prolactin 3.2, testosterone  79 [phone] hemoglobin A1c 6.1%  06/19/2012: Total testosterone  119 [phone]), free testosterone  23.5 (40-224), SHBG 17 (10-50), LH  3.4, FSH 4.7, ACTH  20, cortisol 6.5, TSH 3.072, free T4 0.9 09/23/2012: Total testosterone  115, free testosterone  20.5, SHBG 20 04/02/2013: Total testosterone  76, free testosterone  15.6, SHBG 15  04/26/2013: TSH 2.34, free T4 0.9  Pituitary MRI 12/28/2011: Heterogeneous signal intensity lesion centered in the anterior aspect of the sella measures approximately 1 cm in maximum dimension and demonstrates a fluid hematocrit level with some intrinsic T1 bright/T2 dark signal along its caudal margin consistent with subacute hemorrhage. There is no mass effect on the optic chiasm or evidence of cavernous sinus invasion.  Pituitary MRI 09/2012: Right lateral sellar soft tissue, likely residual pituitary 0.6 x 0.4 x 0.9 cm, homogeneous postcontrast enhancement, abuts medial aspect of the right cavernous ICA. No mass effect.  Pituitary MRI (10/05/2014): No residual tumor.  Pituitary MRI (09/25/2017): Postoperative changes from partial pituitary resection. No recurrent tumor seen.  No change compared with 09/28/2014  Head CT (10/29/2021): Performed due to headaches in the setting of chronic blood thinner use: no acute abnormalities; pituitary gland was  not characterized.   Needs MRI [facility].  He has claustrophobia so the MRI machine has to be completely  open. . Hypogonadotropic hypogonadism: He has retrograde ejaculation.  He is currently seeing urology.  Reviewed history: Diagnosed when he and his wife were trying to get pregnant. They are preparing for IVF at [facility] >> wife's diabetes is not under control and he needs to be between 6 and 7% for the procedure. They are seeing reproductive endocrinology: Dr Julian Kathline Server, MD.  He started to have low T sxs 2011-2012.  He was previously on Testosterone : axillar gel (did not like it as he was always fearing that he can transfer this to his wife) and inj (200 mg 2x a month). On this >> T level normalized, however he cannot do the injections himself. Clomid did not help (3 mo) >> lost capacity to ejaculate and got hot flushes when stopped.    He was started on Xyosted  and Arimidex  by urology >> HAs.   He admits for decreased libido No difficulty obtaining but has problems maintaining an erection No trauma to testes, testicular irradiation or surgery No h/o of mumps orchitis/h/o autoimmune ds. No h/o cryptorchidism He grew and went through puberty like his peers No shrinking of testes. + small testes (<5 ml) No incomplete/delayed sexual development     No breast discomfort/gynecomastia    No loss of body hair (axillary/pubic)/decreased need for shaving No height loss No abnormal sense of smell (only allergies) No hot flushes No vision problems No worst HA of his life, but had headaches No FH of hypogonadism/infertility  No personal h/o infertility -but under investigation No FH of hemochromatosis or pituitary tumors No excessive weight gain or loss.  No chronic diseases No chronic pain. Not on opiates, does not take steroids.  No more than 2 drinks a day of alcohol at a time, and this is rarely No anabolic steroids use No herbal medicines Not on antidepressants  No AI ds in his family, no FH of MS.  He does not have family history of early cardiac disease.   Grandfather had a MI at 40 y/o.  Reviewed his pituitary labs from our previous visits: Low testosterone /LH/FSH, and also low IGF-I: Component     Latest Ref Rng 01/28/2023  Triiodothyronine,Free,Serum     2.3 - 4.2 pg/mL 3.4   T4,Free(Direct)     0.8 - 1.8 ng/dL 1.1   Cortisol, Plasma     mcg/dL 7.8   TSH     9.59 - 5.49 mIU/L 2.97   Prolactin     2.0 - 18.0 ng/mL 1.6 (L)   C206 ACTH      6 - 50 pg/mL 17    Component     Latest Ref Rng 01/24/2022  Triiodothyronine,Free,Serum     2.3 - 4.2 pg/mL 3.5   T4,Free(Direct)     0.60 - 1.60 ng/dL 9.26   TSH     9.64 - 4.49 uIU/mL 2.36   Cortisol, Plasma     ug/dL 8.1   IGF-I, LC/MS     52 - 328 ng/mL 31 (L)   Z-Score (Male)     -2.0 - 2.0 SD -2.8 (L)   Prolactin     2.0 - 18.0 ng/mL 2.1   C206 ACTH      6 - 50 pg/mL 29   Hemoglobin A1C     4.6 - 6.5 % 5.9   Growth Hormone     < OR = 7.1 ng/mL <0.1  Component     Latest Ref Rng & Units 04/30/2018  Testosterone , Serum (Total)     ng/dL 33 (L)  % Free Testosterone      % 2.2  Free Testosterone , S     pg/mL 7.3 (L)  Sex Hormone Binding Globulin     nmol/L 21.4  IGF-I, LC/MS     53 - 331 ng/mL 49 (L)  Z-Score (Male)     -2.0 - 2 SD -2.1 (L)  FSH     1.4 - 18.1 mIU/ML 4.2  LH     1.50 - 9.30 mIU/mL 2.43  Cortisol, Plasma     ug/dL 7.5  R793 ACTH      6 - 50 pg/mL 20  Prolactin     2.0 - 18.0 ng/mL 5.7  TSH     0.35 - 4.50 uIU/mL 2.71  T4,Free(Direct)     0.60 - 1.60 ng/dL 9.12  Triiodothyronine,Free,Serum     2.3 - 4.2 pg/mL 4.0   Component     Latest Ref Rng & Units 02/10/2017  Testosterone , Serum (Total)     264-916 ng/dL 46 (L)  % Free Testosterone      % 1.4  Free Testosterone , S     52-280 pg/mL  6.4 (L)  Sex Hormone Binding Globulin     nmol/L 25.9  IGF-I, LC/MS     53 - 331 ng/mL 64  Z-Score (Male)     -2.0 - 2 SD -1.6  FSH     1.4 - 18.1 mIU/ML 3.4  LH     1.50 - 9.30 mIU/mL 2.65  Cortisol, Plasma     ug/dL 5.0  R793 ACTH      6 - 50  pg/mL 28  Prolactin     2.0 - 18.0 ng/mL 3.3  TSH     0.35 - 4.50 uIU/mL 2.71  T4,Free(Direct)     0.60 - 1.60 ng/dL 9.21  Triiodothyronine,Free,Serum     2.3 - 4.2 pg/mL 3.1   Component     Latest Ref Rng & Units 01/10/2016  Testosterone      264 - 916 ng/dL 29 (L)  Testosterone  Free     8.7 - 25.1 pg/mL 1.6 (L)  Sex Horm Binding Glob, Serum     16.5 - 55.9 nmol/L 22.8  IGF-I, LC/MS     53 - 331 ng/mL 61  Z-Score (Male)     -2.0 - 2.0 SD -1.7  FSH     1.4 - 18.1 mIU/ML 3.7  LH     1.50 - 9.30 mIU/mL 2.76  T4,Free(Direct)     0.60 - 1.60 ng/dL 9.20  Triiodothyronine,Free,Serum     2.3 - 4.2 pg/mL 3.6  Cortisol, Plasma     ug/dL 4.8  R793 ACTH      6 - 50 pg/mL 18  TSH     0.35 - 4.50 uIU/mL 1.27  Prolactin     2.0 - 18.0 ng/mL 4.2   In preparation for pregnancy, is starting on hCG injections tomorrow.  DM2: -Managed by PCP. He previously had prediabetes but was diagnosed with diabetes in 10/2018: Lab Results  Component Value Date   HGBA1C 6.1 12/10/2023   HGBA1C 6.2 07/14/2023   HGBA1C 5.9 04/02/2023   HGBA1C 6.1 12/09/2022   HGBA1C 5.5 08/02/2022   HGBA1C 5.9 01/24/2022   HGBA1C 6.2 05/21/2021   HGBA1C 6.2 (H) 12/02/2020   HGBA1C 6.8 (H) 07/28/2020   HGBA1C 6.5 01/26/2020   HGBA1C 6.5 11/04/2018   HGBA1C 6.9 (H) 02/27/2018  HGBA1C 5.5 11/20/2016   HGBA1C 6.2 (H) 07/11/2016   HGBA1C 5.7 01/10/2016   HGBA1C 5.8 07/06/2015   HGBA1C 6.2 01/25/2015   HGBA1C 5.9 07/08/2014   No CKD:   Chemistry      Component Value Date/Time   NA 138 12/10/2023 1041   NA 141 11/20/2016 0811   K 4.6 12/10/2023 1041   CL 103 12/10/2023 1041   CO2 29 12/10/2023 1041   BUN 14 12/10/2023 1041   BUN 18 11/20/2016 0811   CREATININE 0.72 12/10/2023 1041   CREATININE 0.86 02/28/2023 1615      Component Value Date/Time   CALCIUM  8.9 12/10/2023 1041   ALKPHOS 65 07/14/2023 0906   AST 16 07/14/2023 0906   AST 16 07/09/2019 0942   ALT 14 07/14/2023 0906   ALT 25  07/09/2019 0942   BILITOT 0.5 07/14/2023 0906   BILITOT 0.5 07/09/2019 0942     + History of dyslipidemia: Lab Results  Component Value Date   CHOL 107 04/02/2023   HDL 43 04/02/2023   LDLCALC 44 04/02/2023   TRIG 105 04/02/2023   CHOLHDL 2 08/02/2022  On Crestor  10 mg daily.  Last eye exam: 03/11/2022: No DR.  He sees podiatry.  Last foot exam 06/05/2022- Dr. Sikora.  He is on: - Ozempic  1 >> 2 mg weekly >> Mounjaro  12.5 mg weekly He was previously on Victoza , and Saxenda .   Lab Results  Component Value Date   HGBA1C 6.1 12/10/2023   HGBA1C 6.2 07/14/2023   HGBA1C 5.9 04/02/2023   HGBA1C 6.1 12/09/2022   HGBA1C 5.5 08/02/2022   Obesity: He tried weight watchers in the past. He was also seen in the weight management clinic in the past. On GLP-1/GIP receptor agonist, as mentioned above. He is preparing for Roux-en-Y surgery in 02/2024.  He also has history of B12 deficiency and iron deficiency anemia. He was diagnosed with OSA in 2011.  He uses a CPAP machine. Previously working nights, but now works days. He has PTSD from when he was intubated for his TSR surgery in 2014.  ROS: + See HPI  I reviewed pt's medications, allergies, PMH, social hx, family hx, and changes were documented in the history of present illness. Otherwise, unchanged from my initial visit note.  Past Medical History:  Diagnosis Date   ADHD    Anemia    Anxiety and depression    h/o suicidality w/ citalopram   Atrial fibrillation (HCC)    Diabetes mellitus without complication (HCC)    Difficult intubation    ETD (eustachian tube dysfunction)    s/p ENT, declined ear tuves before    GERD (gastroesophageal reflux disease)    History of chicken pox    Titered on 08/16/2005   Hypertension    Hypogonadism male    Dr Trixie, used to see urology   Infertility male    Internal hemorrhoids    s/p banding   Low testosterone     Migraine    on topamax    OSA on CPAP    Palpitations     Pituitary mass    h/o increased prolactin, Dr Trixie (previously @ [facility])   Prediabetes    A1C 6.2 years ago   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   Vitamin B 12 deficiency    h/o   Vitamin D  deficiency    Past Surgical History:  Procedure Laterality Date   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-04-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, ACTH   TONSILLECTOMY AND ADENOIDECTOMY     Social History   Socioeconomic History   Marital status: Married    Spouse name: Not on file   Number of children: 0   Years of education: Not on file   Highest education level: Doctorate  Occupational History   Occupation: [employer], 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    Employer: [employer]   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  Tobacco Use   Smoking status: Never   Smokeless tobacco: Never  Vaping Use   Vaping status: Never Used  Substance and Sexual Activity   Alcohol use: Not Currently    Comment: none x 10 yrs   Drug use: No   Sexual activity: Not on file  Other Topics Concern   Not on file  Social History Narrative   Lives w/ wife   1 dog    Social Drivers of Health   Tobacco Use: Low Risk (01/30/2024)   Patient History    Smoking Tobacco Use: Never    Smokeless Tobacco Use: Never    Passive Exposure: Not on file  Financial Resource Strain: Low Risk (12/09/2023)   Overall Financial Resource Strain (CARDIA)    Difficulty of Paying Living Expenses: Not hard at all  Food Insecurity: No Food Insecurity (12/09/2023)   Epic    Worried About Radiation Protection Practitioner of Food in the Last Year: Never true    Ran Out of Food in the Last Year: Never true  Transportation Needs: No Transportation Needs (12/09/2023)   Epic    Lack of Transportation (Medical): No    Lack of Transportation (Non-Medical): No  Physical Activity: Inactive (12/09/2023)   Exercise Vital Sign    Days of Exercise per Week: 0 days    Minutes of Exercise per Session: Not on file  Stress: No Stress Concern Present (12/09/2023)   [employer]ational [employer] - Occupational Stress Questionnaire    Feeling of Stress: Only a little  Social Connections: Socially Integrated (12/09/2023)   Social Connection and Isolation Panel    Frequency of Communication with Friends and Family: More than three times a week    Frequency of Social Gatherings with Friends and Family: Once a week    Attends Religious Services: More than 4 times per year    Active Member of [employer] or Organizations: Yes    Attends Banker Meetings: More than 4 times per year    Marital Status: Married  Catering Manager Violence: Not on file  Depression (PHQ2-9): Low Risk (12/10/2023)   Depression (PHQ2-9)    PHQ-2 Score: 0  Alcohol Screen: Not on file  Housing: Unknown (12/09/2023)   Epic    Unable to Pay for Housing in the Last Year: No    Number of Times Moved in the Last Year: Not on file    Homeless in the Last Year: No  Utilities: Not on file  Health Literacy: Not on file   Current Outpatient Medications on File Prior to Visit  Medication Sig Dispense Refill   apixaban  (ELIQUIS ) 5 MG TABS tablet Take 1 tablet (5 mg total) by mouth 2 (two) times daily. 180 tablet 3   augmented betamethasone  dipropionate (DIPROLENE -AF) 0.05 % cream Apply a small amount to affected area twice a day 50 g 1   azelastine  (ASTELIN ) 0.1 % nasal spray Place 2 sprays into both nostrils 2 (two) times daily. Use in each nostril as directed 30 mL 12   Cholecalciferol (VITAMIN D3) 125 MCG (5000 UT) TABS  Take 1 tablet by mouth 3 (three) times a week. Take M, W, F (Patient taking differently: Take 4,000 Units by mouth daily.)     cyclobenzaprine  (FLEXERIL ) 10 MG tablet Take 1 tablet (10 mg total) by mouth at bedtime. 90 tablet 3   fexofenadine (ALLEGRA) 180 MG tablet Take 180 mg by mouth daily.     flecainide  (TAMBOCOR ) 100 MG tablet Take 1 tablet (100 mg total) by mouth every 12 (twelve) hours. 180 tablet 3   fluticasone  (FLONASE ) 50 MCG/ACT nasal spray Place 2  sprays into both nostrils daily. 16 g 6   losartan  (COZAAR ) 100 MG tablet Take 1 tablet (100 mg total) by mouth daily. 90 tablet 3   Magnesium  Oxide (MAG-OXIDE PO) Take 400 mg by mouth at bedtime.     metFORMIN  (GLUCOPHAGE ) 1000 MG tablet Take 1 tablet (1,000 mg total) by mouth 2 (two) times daily with a meal. 180 tablet 1   metoprolol  tartrate (LOPRESSOR ) 50 MG tablet Take 1 tablet (50 mg total) by mouth 2 (two) times daily. 180 tablet 1   Multiple Vitamin (MULTIVITAMIN WITH MINERALS) TABS tablet Take 1 tablet by mouth daily.     mupirocin  ointment (BACTROBAN ) 2 % Apply liberally to affected area twice a day as directed (Patient taking differently: Apply 1 Application topically 2 (two) times daily as needed.) 22 g 3   omeprazole  (PRILOSEC) 40 MG capsule Take 1 capsule (40 mg total) by mouth daily before breakfast. 90 capsule 1   risankizumab -rzaa (SKYRIZI  PEN) 150 MG/ML pen Inject 1 pen injector subcutaneously every three months 1 mL 3   rosuvastatin  (CRESTOR ) 10 MG tablet Take 1 tablet (10 mg total) by mouth at bedtime. 90 tablet 1   tirzepatide  (MOUNJARO ) 12.5 MG/0.5ML Pen Inject 12.5 mg into the skin once a week. 6 mL 0   triamcinolone  cream (KENALOG ) 0.1 % Apply a small amount to skin to on lower legs twice a day for 2-3 weeks at a time. 454 g 1   No current facility-administered medications on file prior to visit.   Allergies  Allergen Reactions   Influenza Virus Vaccine Anaphylaxis   Levaquin [Levofloxacin In D5w] Swelling   Celexa [Citalopram] Other (See Comments)    Mental status changes    Dextrans Other (See Comments)    Makes the pt. Drowsy.    Hydrocodone-Acetaminophen      REACTION: itching   Latex    Oxycodone Itching   Shiitake Mushroom (Obsolete) Rash   Family History  Problem Relation Age of Onset   Obesity Mother    Depression Father    Anxiety disorder Father    Bipolar disorder Father    Drug abuse Father    Cancer Maternal Grandfather        GBM    Diabetes  Paternal Grandmother    Hypertension Paternal Grandmother    Diabetes Paternal Grandfather    Prostate cancer Paternal Grandfather    CAD Other        PGF   Colon cancer Neg Hx    PE: BP 120/70   Pulse 88   Ht 5' 9.5 (1.765 m)   Wt (!) 375 lb (170.1 kg)   SpO2 96%   BMI 54.58 kg/m   Wt Readings from Last 20 Encounters:  01/30/24 (!) 375 lb (170.1 kg)  01/26/24 (!) 374 lb 9.6 oz (169.9 kg)  01/20/24 (!) 380 lb 6.4 oz (172.5 kg)  12/10/23 (!) 372 lb (168.7 kg)  11/03/23 (!) 382 lb 6.4 oz (173.5  kg)  10/22/23 (!) 374 lb 12.8 oz (170 kg)  08/01/23 (!) 378 lb 9.6 oz (171.7 kg)  07/14/23 (!) 395 lb (179.2 kg)  06/02/23 (!) 395 lb 14.4 oz (179.6 kg)  05/12/23 (!) 391 lb 3.2 oz (177.4 kg)  04/21/23 (!) 385 lb 11.2 oz (175 kg)  04/18/23 (!) 390 lb (176.9 kg)  02/28/23 (!) 378 lb 4 oz (171.6 kg)  01/28/23 (!) 380 lb (172.4 kg)  01/27/23 (!) 381 lb 3.2 oz (172.9 kg)  01/15/23 (!) 376 lb (170.6 kg)  12/09/22 (!) 387 lb (175.5 kg)  08/02/22 (!) 375 lb 4 oz (170.2 kg)  07/29/22 (!) 378 lb (171.5 kg)  02/08/22 (!) 363 lb 11.2 oz (165 kg)   Constitutional: overweight, in NAD Eyes:  EOMI, no exophthalmos ENT: no neck masses, no cervical lymphadenopathy Cardiovascular: RRR, No MRG, + B leg swelling Respiratory: CTA B Musculoskeletal: no deformities Skin:no rashes except stasis dermatitis Neurological: no tremor with outstretched hands  ASSESSMENT: 1.  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  Hypogonadotropic hypogonadism  3. DM2 - per PCP  4. Obesity class III - prev. Saw Dr. Verdon in the weight management clinic  PLAN:  1.  History of pituitary adenoma -s/p TSR in 02/2022 for pituitary microadenoma.  Final pathology showed prolactin secreting cells with less than 5% of the cells staining for ACTH  - Reviewing his pituitary labs from 2017 up until our last visit in 2024, they were normal with exception of the gonadotropin   axis: Low testosterone , FSH, LH, but IGF-I also returned low in 2021 and prolactin returned low in 2024.  We discussed about pluses and minuses of growth hormone therapy.  He was under investigation for enlarged lymph nodes and ? lymphoma diagnosis and we discussed that growth hormone therapy was not a good idea at that time. -Reviewed the report of the latest MRI from 09/2017, this showed no recurrence.  He is also followed by Dr. Nancye with neurosurgery.  He had a head CT in 10/2021 due to his headaches, and this did not show a pituitary abnormality. -We discussed in 2021 and again at last visit about repeating a pituitary MRI since ACTH  staining tumors have a high risk for recurrence-he agreed at last visit and wanted to discuss with neurosurgery at [facility] to order this in the same system.  However, he did not have this yet.  We again discussed about the importance of getting another MRI now, 6 years from the previous. -Will also check pituitary labs today -will see the patient back in 1 year  2.  Hypogonadotropic hypogonadism - He has a long history of hypogonadotropic hypogonadism, without an elevated prolactin level. - We were not able to use testosterone  in the past for him as he and his wife were preparing for a pregnancy.  However, they are not pursuing this anymore due to his wife's difficult to control diabetes. - Of note, he tried Clomid in the past without results.  He was started on Xyosted  once a week injections and Arimidex  but had to come off due to headaches. - At last visit, he was preparing to start p.o. testosterone  by urology.  He tells me that he did try this but could not tolerated due to headaches and increased blood pressure. - We discussed that after surgery his Roux-en-Y surgery, it is possible that his testosterone  level will increase  3.  Type 2 diabetes and 4. Obesity class III -He lost  7 pounds before last visit and 6 more since then - Previously on Ozempic  which  was causing headaches but he switched to Mounjaro  since last visit.  This is causing some constipation, but tolerable.  He is on the near-max dose without a significant weight loss. - Last HbA1c reviewed from 11/2023 and this was at goal, at 6.1% - he will have Roux-en-Y surgery on 02/23/2024.  We discussed that his diabetes may reverse after his surgery.  Lela Fendt, MD PhD [facility]

## 2024-01-30 NOTE — Patient Instructions (Signed)
Please get a new pituitary MRI.  Please stop at the lab.  Please come back for a follow-up appointment in 1 year.

## 2024-01-31 ENCOUNTER — Other Ambulatory Visit (HOSPITAL_COMMUNITY): Payer: Self-pay

## 2024-02-02 ENCOUNTER — Ambulatory Visit: Payer: Self-pay | Admitting: Internal Medicine

## 2024-02-03 LAB — T4, FREE: Free T4: 1.1 ng/dL (ref 0.8–1.8)

## 2024-02-03 LAB — CORTISOL: Cortisol, Plasma: 5.8 ug/dL

## 2024-02-03 LAB — ACTH: C206 ACTH: 19 pg/mL (ref 6–50)

## 2024-02-03 LAB — TSH: TSH: 2.58 m[IU]/L (ref 0.40–4.50)

## 2024-02-03 LAB — T3, FREE: T3, Free: 3 pg/mL (ref 2.3–4.2)

## 2024-02-03 LAB — PROLACTIN: Prolactin: 1.9 ng/mL — ABNORMAL LOW (ref 2.0–18.0)

## 2024-02-04 DIAGNOSIS — G4733 Obstructive sleep apnea (adult) (pediatric): Secondary | ICD-10-CM | POA: Diagnosis not present

## 2024-02-07 ENCOUNTER — Other Ambulatory Visit (HOSPITAL_COMMUNITY): Payer: Self-pay

## 2024-02-15 ENCOUNTER — Other Ambulatory Visit: Payer: Self-pay | Admitting: Internal Medicine

## 2024-02-16 ENCOUNTER — Other Ambulatory Visit: Payer: Self-pay

## 2024-02-16 ENCOUNTER — Other Ambulatory Visit (HOSPITAL_COMMUNITY): Payer: Self-pay

## 2024-02-16 MED ORDER — OMEPRAZOLE 40 MG PO CPDR
40.0000 mg | DELAYED_RELEASE_CAPSULE | Freq: Every day | ORAL | 1 refills | Status: DC
  Filled 2024-02-16: qty 90, 90d supply, fill #0

## 2024-02-17 NOTE — Progress Notes (Signed)
 Date of COVID positive in last 90 days:  PCP - Aloysius Mech, MD Cardiologist - Ole Holts, MD LOV 01/20/24  Chest x-ray - 04/21/23 Epic EKG - 01/20/24 Epic Stress Test - N/A ECHO - 12/03/20 Epic Cardiac Cath - N/A Pacemaker/ICD device last checked:N/A Spinal Cord Stimulator:N/A  Bowel Prep - N/A  Sleep Study - N/A CPAP -   Fasting Blood Sugar - N/A Checks Blood Sugar _____ times a day  Last dose of GLP1 agonist-  N/A GLP1 instructions:  Do not take after     Last dose of SGLT-2 inhibitors-  N/A SGLT-2 instructions:  Do not take after     Blood Thinner Instructions: Eliquis  Aspirin Instructions:N/A Last Dose:  Activity level:  Can go up a flight of stairs and perform activities of daily living without stopping and without symptoms of chest pain or shortness of breath.  Able to exercise without symptoms  Unable to go up a flight of stairs without symptoms of     Anesthesia review: HTN, a fib, DM2, OSA, anemia  Patient denies shortness of breath, fever, cough and chest pain at PAT appointment  Patient verbalized understanding of instructions that were given to them at the PAT appointment. Patient was also instructed that they will need to review over the PAT instructions again at home before surgery.

## 2024-02-18 ENCOUNTER — Encounter (HOSPITAL_COMMUNITY)
Admission: RE | Admit: 2024-02-18 | Discharge: 2024-02-18 | Disposition: A | Discharge status: Home / Self Care | Source: Ambulatory Visit | Attending: General Surgery | Admitting: General Surgery

## 2024-02-18 ENCOUNTER — Encounter (HOSPITAL_COMMUNITY): Payer: Self-pay

## 2024-02-18 ENCOUNTER — Other Ambulatory Visit: Payer: Self-pay

## 2024-02-18 ENCOUNTER — Other Ambulatory Visit (HOSPITAL_COMMUNITY): Payer: Self-pay

## 2024-02-18 ENCOUNTER — Other Ambulatory Visit: Payer: Self-pay | Admitting: Pharmacy Technician

## 2024-02-18 ENCOUNTER — Ambulatory Visit: Attending: Internal Medicine | Admitting: Pharmacist

## 2024-02-18 VITALS — BP 143/94 | HR 97 | Temp 98.1°F | Resp 16 | Ht 69.0 in | Wt 367.0 lb

## 2024-02-18 DIAGNOSIS — Z7901 Long term (current) use of anticoagulants: Secondary | ICD-10-CM | POA: Insufficient documentation

## 2024-02-18 DIAGNOSIS — I10 Essential (primary) hypertension: Secondary | ICD-10-CM | POA: Insufficient documentation

## 2024-02-18 DIAGNOSIS — Z79899 Other long term (current) drug therapy: Secondary | ICD-10-CM | POA: Insufficient documentation

## 2024-02-18 DIAGNOSIS — Z01812 Encounter for preprocedural laboratory examination: Secondary | ICD-10-CM | POA: Insufficient documentation

## 2024-02-18 DIAGNOSIS — F32A Depression, unspecified: Secondary | ICD-10-CM | POA: Insufficient documentation

## 2024-02-18 DIAGNOSIS — E119 Type 2 diabetes mellitus without complications: Secondary | ICD-10-CM | POA: Insufficient documentation

## 2024-02-18 DIAGNOSIS — K219 Gastro-esophageal reflux disease without esophagitis: Secondary | ICD-10-CM | POA: Insufficient documentation

## 2024-02-18 DIAGNOSIS — G4733 Obstructive sleep apnea (adult) (pediatric): Secondary | ICD-10-CM | POA: Insufficient documentation

## 2024-02-18 DIAGNOSIS — Z6841 Body Mass Index (BMI) 40.0 and over, adult: Secondary | ICD-10-CM | POA: Insufficient documentation

## 2024-02-18 DIAGNOSIS — I480 Paroxysmal atrial fibrillation: Secondary | ICD-10-CM | POA: Insufficient documentation

## 2024-02-18 DIAGNOSIS — Z7984 Long term (current) use of oral hypoglycemic drugs: Secondary | ICD-10-CM | POA: Diagnosis not present

## 2024-02-18 DIAGNOSIS — Z9889 Other specified postprocedural states: Secondary | ICD-10-CM | POA: Insufficient documentation

## 2024-02-18 DIAGNOSIS — Z01818 Encounter for other preprocedural examination: Secondary | ICD-10-CM | POA: Diagnosis present

## 2024-02-18 HISTORY — DX: Other specified cardiac arrhythmias: I49.8

## 2024-02-18 HISTORY — DX: Nausea with vomiting, unspecified: R11.2

## 2024-02-18 HISTORY — DX: Zoster without complications: B02.9

## 2024-02-18 LAB — CBC
HCT: 40.5 % (ref 39.0–52.0)
Hemoglobin: 13.3 g/dL (ref 13.0–17.0)
MCH: 26.9 pg (ref 26.0–34.0)
MCHC: 32.8 g/dL (ref 30.0–36.0)
MCV: 81.8 fL (ref 80.0–100.0)
Platelets: 198 K/uL (ref 150–400)
RBC: 4.95 MIL/uL (ref 4.22–5.81)
RDW: 13.3 % (ref 11.5–15.5)
WBC: 7.4 K/uL (ref 4.0–10.5)
nRBC: 0 % (ref 0.0–0.2)

## 2024-02-18 LAB — HEMOGLOBIN A1C
Hgb A1c MFr Bld: 5.6 % (ref 4.8–5.6)
Mean Plasma Glucose: 114.02 mg/dL

## 2024-02-18 LAB — GLUCOSE, CAPILLARY: Glucose-Capillary: 103 mg/dL — ABNORMAL HIGH (ref 70–99)

## 2024-02-18 NOTE — Progress Notes (Signed)
" ° °  S: Patient presents for review of their specialty medication therapy.  Patient is currently taking Skyrizi  for psoriasis. Patient is managed by Dr. Court for this.   Adherence: confirms  Efficacy: reports that this works well for him.   Dosing: Plaque psoriasis, moderate to severe: SubQ: Two consecutive injections (75 mg each) for a total dose of 150 mg at weeks 0, 4, and then every 12 weeks thereafter.  Screening: TB test: completed  Monitoring: S/sx of infection: none  S/sx of hypersensitivity/injection site reaction: none   O:     Lab Results  Component Value Date   WBC 7.4 02/18/2024   HGB 13.3 02/18/2024   HCT 40.5 02/18/2024   MCV 81.8 02/18/2024   PLT 198 02/18/2024      Chemistry      Component Value Date/Time   NA 138 12/10/2023 1041   NA 141 11/20/2016 0811   K 4.6 12/10/2023 1041   CL 103 12/10/2023 1041   CO2 29 12/10/2023 1041   BUN 14 12/10/2023 1041   BUN 18 11/20/2016 0811   CREATININE 0.72 12/10/2023 1041   CREATININE 0.86 02/28/2023 1615      Component Value Date/Time   CALCIUM  8.9 12/10/2023 1041   ALKPHOS 65 07/14/2023 0906   AST 16 07/14/2023 0906   AST 16 07/09/2019 0942   ALT 14 07/14/2023 0906   ALT 25 07/09/2019 0942   BILITOT 0.5 07/14/2023 0906   BILITOT 0.5 07/09/2019 0942       A/P: 1. Medication review: Patient currently on Skyrizi  for psoriasis. Reviewed the medication with the patient, including the following: Skyrizi  is a monoclonal antibody used in the treatment of psoriasis. Patient educated on purpose, proper use and potential adverse effects of Skyrizi . Possible adverse effects are infections, headache, and injection site reactions. Live vaccinations should be avoided while on therapy. SubQ: Administer the two consecutive injections subcutaneously at different anatomic locations, such as thighs, abdomen, or back of upper arms. Do not inject into areas where the skin is tender, bruised, red, hard, or affected by  psoriasis. Intended for use under supervision of a health care professional; self-injection may occur after proper training (except back of upper arms). No recommendations for any changes at this time.  Of note, he has an upcoming procedure and has questions regarding his insurance deductible. I will reach out to Luke Blowers to see how best to advise the patient.   Herlene Fleeta Morris, PharmD, JAQUELINE, CPP Clinical Pharmacist [facility] [phone]            "

## 2024-02-18 NOTE — Progress Notes (Signed)
 BMP hemolyzed. Recollect DOS

## 2024-02-18 NOTE — Progress Notes (Signed)
 Specialty Pharmacy Refill Coordination Note  Julian Washington is a 44 y.o. male contacted today regarding refills of specialty medication(s) Risankizumab -rzaa (Skyrizi  Pen)   Patient requested Delivery   Delivery date: 02/25/24   Verified address: [address] [place] [address]   Medication will be filled on: 02/24/24

## 2024-02-18 NOTE — Patient Instructions (Addendum)
 SURGICAL WAITING ROOM VISITATION  Patients having surgery or a procedure may have no more than 2 support people in the waiting area - these visitors may rotate.    Children ages 82 and under will not be able to visit patients in [facility] under most circumstances.   Visitors with respiratory illnesses are discouraged from visiting and should remain at home.  If the patient needs to stay at the hospital during part of their recovery, the visitor guidelines for inpatient rooms apply. Pre-op nurse will coordinate an appropriate time for 1 support person to accompany patient in pre-op.  This support person may not rotate.    Please refer to the [facility] website for the visitor guidelines for Inpatients (after your surgery is over and you are in a regular room).    Your procedure is scheduled on: 02/23/24   Report to [facility] Main Entrance    Report to admitting at 5:15 AM   Call this number if you have problems the morning of surgery [phone]   MORNING OF SURGERY DRINK:   DRINK 1 G2 drink BEFORE YOU LEAVE HOME, DRINK ALL OF THE  G2 DRINK AT ONE TIME.   NO SOLID FOOD AFTER 600 PM THE NIGHT BEFORE YOUR SURGERY. YOU MAY DRINK CLEAR FLUIDS. THE G2 DRINK YOU DRINK BEFORE YOU LEAVE HOME WILL BE THE LAST FLUIDS YOU DRINK BEFORE SURGERY.   You may have the following liquids until 4:30 AM DAY OF SURGERY  Water Non-Citrus Juices (without pulp, NO RED-Apple, White grape, White cranberry) Black Coffee (NO MILK/CREAM OR CREAMERS, sugar ok)  Clear Tea (NO MILK/CREAM OR CREAMERS, sugar ok) regular and decaf                             Plain Jell-O (NO RED)                                           Fruit ices (not with fruit pulp, NO RED)                                     Popsicles (NO RED)                                                               Sports drinks like Gatorade (NO RED)    The day of surgery:  Drink ONE (1) Pre-Surgery G2 at 4:30 AM the morning of  surgery. Drink in one sitting. Do not sip.  This drink was given to you during your hospital  pre-op appointment visit. Nothing else to drink after completing the  Pre-Surgery G2.          If you have questions, please contact your surgeons office.   FOLLOW BOWEL PREP AND ANY ADDITIONAL PRE OP INSTRUCTIONS YOU RECEIVED FROM YOUR SURGEON'S OFFICE!!!  PAIN IS EXPECTED AFTER SURGERY AND WILL NOT BE COMPLETELY ELIMINATED. AMBULATION AND TYLENOL  WILL HELP REDUCE INCISIONAL AND GAS PAIN. MOVEMENT IS KEY!  YOU ARE EXPECTED TO BE OUT OF BED WITHIN 4 HOURS OF  ADMISSION TO YOUR PATIENT ROOM.  SITTING IN THE RECLINER THROUGHOUT THE DAY IS IMPORTANT FOR DRINKING FLUIDS AND MOVING GAS THROUGHOUT THE GI TRACT.  COMPRESSION STOCKINGS SHOULD BE WORN [facility] STAY UNLESS YOU ARE WALKING.   INCENTIVE SPIROMETER SHOULD BE USED EVERY HOUR WHILE AWAKE TO DECREASE POST-OPERATIVE COMPLICATIONS SUCH AS PNEUMONIA.  WHEN DISCHARGED HOME, IT IS IMPORTANT TO CONTINUE TO WALK EVERY HOUR AND USE THE INCENTIVE SPIROMETER EVERY HOUR.      Oral Hygiene is also important to reduce your risk of infection.                                    Remember - BRUSH YOUR TEETH THE MORNING OF SURGERY WITH YOUR REGULAR TOOTHPASTE  DENTURES WILL BE REMOVED PRIOR TO SURGERY PLEASE DO NOT APPLY Poly grip OR ADHESIVES!!!   Last dose of Eliquis  02/20/24   Stop all vitamins and herbal supplements 7 days before surgery.   Take these medicines the morning of surgery with A SIP OF WATER: Claritin , Flecainide , Metoprolol   DO NOT TAKE ANY ORAL DIABETIC MEDICATIONS DAY OF YOUR SURGERY  How to Manage Your Diabetes Before and After Surgery  Why is it important to control my blood sugar before and after surgery? Improving blood sugar levels before and after surgery helps healing and can limit problems. A way of improving blood sugar control is eating a healthy diet by:  Eating less sugar and carbohydrates  Increasing  activity/exercise  Talking with your doctor about reaching your blood sugar goals High blood sugars (greater than 180 mg/dL) can raise your risk of infections and slow your recovery, so you will need to focus on controlling your diabetes during the weeks before surgery. Make sure that the doctor who takes care of your diabetes knows about your planned surgery including the date and location.  How do I manage my blood sugar before surgery? Check your blood sugar at least 4 times a day, starting 2 days before surgery, to make sure that the level is not too high or low. Check your blood sugar the morning of your surgery when you wake up and every 2 hours until you get to the Short Stay unit. If your blood sugar is less than 70 mg/dL, you will need to treat for low blood sugar: Do not take insulin . Treat a low blood sugar (less than 70 mg/dL) with  cup of clear juice (cranberry or apple), 4 glucose tablets, OR glucose gel. Recheck blood sugar in 15 minutes after treatment (to make sure it is greater than 70 mg/dL). If your blood sugar is not greater than 70 mg/dL on recheck, call [phone] for further instructions. Report your blood sugar to the short stay nurse when you get to Short Stay.  If you are admitted to the hospital after surgery: Your blood sugar will be checked by the staff and you will probably be given insulin  after surgery (instead of oral diabetes medicines) to make sure you have good blood sugar levels. The goal for blood sugar control after surgery is 80-180 mg/dL.   WHAT DO I DO ABOUT MY DIABETES MEDICATION?  Do not take oral diabetes medicines (pills) the morning of surgery.  Do not take Mounjaro  after 02/15/24.  DO NOT TAKE THE FOLLOWING 7 DAYS PRIOR TO SURGERY: Ozempic , Wegovy , Rybelsus  (Semaglutide ), Byetta (exenatide), Bydureon (exenatide ER), Victoza , Saxenda  (liraglutide ), or Trulicity (dulaglutide) Mounjaro  (Tirzepatide ) Adlyxin (Lixisenatide), Polyethylene  Glycol  Loxenatide.  Reviewed and Endorsed by [facility] Patient Education Committee, August 2015  Bring CPAP mask and tubing day of surgery.                              You may not have any metal on your body including jewelry, and body piercing             Do not wear lotions, powders, cologne, or deodorant              Men may shave face and neck.   Do not bring valuables to the hospital. [facility] IS NOT             RESPONSIBLE   FOR VALUABLES.   Contacts, glasses, dentures or bridgework may not be worn into surgery.   Bring small overnight bag day of surgery.   DO NOT BRING YOUR HOME MEDICATIONS TO THE HOSPITAL. PHARMACY WILL DISPENSE MEDICATIONS LISTED ON YOUR MEDICATION LIST TO YOU DURING YOUR ADMISSION IN THE HOSPITAL!              Please read over the following fact sheets you were given: IF YOU HAVE QUESTIONS ABOUT YOUR PRE-OP INSTRUCTIONS PLEASE CALL [phone] [place].   If you received a COVID test during your pre-op visit  it is requested that you wear a mask when out in public, stay away from anyone that may not be feeling well and notify your surgeon if you develop symptoms. If you test positive for Covid or have been in contact with anyone that has tested positive in the last 10 days please notify you surgeon.    [facility] - Preparing for Surgery Before surgery, you can play an important role.  Because skin is not sterile, your skin needs to be as free of germs as possible.  You can reduce the number of germs on your skin by washing with CHG (chlorahexidine gluconate) soap before surgery.  CHG is an antiseptic cleaner which kills germs and bonds with the skin to continue killing germs even after washing. Please DO NOT use if you have an allergy to CHG or antibacterial soaps.  If your skin becomes reddened/irritated stop using the CHG and inform your nurse when you arrive at Short Stay. Do not shave (including legs and underarms) for at least 48 hours prior to the first  CHG shower.  You may shave your face/neck.  Please follow these instructions carefully:  1.  Shower with CHG Soap the night before surgery ONLY (DO NOT USE THE SOAP THE MORNING OF SURGERY).  2.  If you choose to wash your hair, wash your hair first as usual with your normal  shampoo.  3.  After you shampoo, rinse your hair and body thoroughly to remove the shampoo.                             4.  Use CHG as you would any other liquid soap.  You can apply chg directly to the skin and wash.  Gently with a scrungie or clean washcloth.  5.  Apply the CHG Soap to your body ONLY FROM THE NECK DOWN.   Do   not use on face/ open                           Wound or open  sores. Avoid contact with eyes, ears mouth and   genitals (private parts).                       Wash face,  Genitals (private parts) with your normal soap.             6.  Wash thoroughly, paying special attention to the area where your    surgery  will be performed.  7.  Thoroughly rinse your body with warm water from the neck down.  8.  DO NOT shower/wash with your normal soap after using and rinsing off the CHG Soap.                9.  Pat yourself dry with a clean towel.            10.  Wear clean pajamas.            11.  Place clean sheets on your bed the night of your first shower and do not  sleep with pets. Day of Surgery : Do not apply any CHG, lotions/deodorants the morning of surgery.  Please wear clean clothes to the hospital/surgery center.  FAILURE TO FOLLOW THESE INSTRUCTIONS MAY RESULT IN THE CANCELLATION OF YOUR SURGERY  PATIENT SIGNATURE_________________________________  NURSE SIGNATURE__________________________________  ________________________________________________________________________

## 2024-02-18 NOTE — Progress Notes (Signed)
Please place orders for PAT appointment.  

## 2024-02-19 ENCOUNTER — Encounter (HOSPITAL_COMMUNITY): Payer: Self-pay

## 2024-02-19 ENCOUNTER — Other Ambulatory Visit: Payer: Self-pay

## 2024-02-19 NOTE — Progress Notes (Signed)
 " Case: [id number] Date/Time: 02/23/24 0715   Procedures:      CREATION, GASTRIC BYPASS, LAPAROSCOPIC WITH ROUX-EN-Y GASTROENTEROSTOMY     ENDOSCOPY, UPPER GI TRACT   Anesthesia type: General   Pre-op diagnosis: MORBID OBESITY   Location: [facility] ROOM 02 / [facility]   Surgeons: [name], MD       DISCUSSION: Julian Washington is a 44 yo male with PMH of HTN, A.fib on Eliquis , OSA on CPAP, migraine, GERD, pituitary adenoma s/p resection (2014), DM, anxiety, depression, obesity (BMI 54).  Prior anesthesia complications include difficult intubation, PONV.  Airway note from 2014 when he had a pituitary adenoma removed states that they performed an awake intubation using fiberoptic scope.  Patient with history of paroxysmal A-fib on flecainide  and Eliquis .  Last seen by EP on 01/20/2024 by Dr. Cindie.  Noted to be doing well, tolerating medicines. Advised f/u in 6 months.  Patient follows with Endocrine for hx of pituitary adenoma, T2DM. A1c was 5.6 on pre op labs.  LD Eliquis : 1/9 LD Mounjaro : 1//4  VS: BP (!) 143/94   Pulse 97   Temp 36.7 C   Resp 16   Ht 5' 9 (1.753 m)   Wt (!) 166.5 kg   SpO2 99%   BMI 54.20 kg/m   PROVIDERS: Amon Aloysius BRAVO, MD Cardiologist - Ole Cindie, MD LOV 01/20/24 Pulmonologist- Madelin Stank, NP LOV 01/31/23  LABS: Labs reviewed: Acceptable for surgery. BMP hemolyzed, recollect DOS (all labs ordered are listed, but only abnormal results are displayed)  Labs Reviewed  GLUCOSE, CAPILLARY - Abnormal; Notable for the following components:      Result Value   Glucose-Capillary 103 (*)    All other components within normal limits  HEMOGLOBIN A1C  CBC     EKG 01/20/2024:  Normal sinus rhythm   CCTA 12/04/2020:  IMPRESSION: 1. No evidence of CAD, CADRADS = 0.   2. Coronary calcium  score of 0.   3. Normal coronary origins with co-dominance.  Echo 12/03/2020:  IMPRESSIONS    1. Left ventricular ejection fraction, by estimation, is 60 to 65%.  The left ventricle has normal function. The left ventricle has no regional wall motion abnormalities. There is mild concentric left ventricular hypertrophy. Left ventricular diastolic parameters were normal.  2. Right ventricular systolic function is normal. The right ventricular size is normal.  3. The mitral valve is grossly normal. No evidence of mitral valve regurgitation. No evidence of mitral stenosis.  4. The aortic valve was not well visualized. Aortic valve regurgitation is not visualized. No aortic stenosis is present.  Comparison(s): No significant change from prior study.  Conclusion(s)/Recommendation(s): Normal biventricular function without evidence of hemodynamically significant valvular heart disease. Technically challenging study, but no significant abnormalities seen.    Past Medical History:  Diagnosis Date   ADHD    Anemia    Anxiety and depression    h/o suicidality w/ citalopram   Atrial bigeminy    Atrial fibrillation (HCC)    RVR   Diabetes mellitus without complication (HCC)    Difficult intubation    ETD (eustachian tube dysfunction)    s/p ENT, declined ear tuves before    GERD (gastroesophageal reflux disease)    History of chicken pox    Titered on 08/16/2005   Hypertension    Hypogonadism male    Dr Trixie, used to see urology   Infertility male    Internal hemorrhoids    s/p banding   Low testosterone   Migraine    on topamax    OSA on CPAP    Palpitations    Palpitations    Pituitary mass    h/o increased prolactin, Dr Trixie (previously @ [facility])   Pneumonia 2012   PONV (postoperative nausea and vomiting)    Prediabetes    A1C 6.2 years ago   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   Shingles    Vitamin B 12 deficiency    h/o   Vitamin D  deficiency     Past Surgical History:  Procedure Laterality Date   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-04-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, ACTH    TONSILLECTOMY AND ADENOIDECTOMY       MEDICATIONS:  apixaban  (ELIQUIS ) 5 MG TABS tablet   augmented betamethasone  dipropionate (DIPROLENE -AF) 0.05 % cream   azelastine  (ASTELIN ) 0.1 % nasal spray   Cholecalciferol (SM VITAMIN D3) 100 MCG (4000 UT) CAPS   cyclobenzaprine  (FLEXERIL ) 10 MG tablet   flecainide  (TAMBOCOR ) 100 MG tablet   fluticasone  (FLONASE ) 50 MCG/ACT nasal spray   loratadine  (CLARITIN ) 10 MG tablet   losartan  (COZAAR ) 100 MG tablet   Magnesium  Oxide (MAG-OXIDE PO)   metFORMIN  (GLUCOPHAGE ) 1000 MG tablet   metoprolol  tartrate (LOPRESSOR ) 50 MG tablet   Multiple Vitamin (MULTIVITAMIN WITH MINERALS) TABS tablet   mupirocin  ointment (BACTROBAN ) 2 %   omeprazole  (PRILOSEC) 40 MG capsule   risankizumab -rzaa (SKYRIZI  PEN) 150 MG/ML pen   rosuvastatin  (CRESTOR ) 10 MG tablet   thiamine (VITAMIN B-1) 100 MG tablet   tirzepatide  (MOUNJARO ) 12.5 MG/0.5ML Pen   triamcinolone  cream (KENALOG ) 0.1 %   No current facility-administered medications for this encounter.    Burnard CHRISTELLA [name] Surgical Short Stay/Anesthesiology [facility] Phone [phone] 02/19/2024 9:09 AM       "

## 2024-02-19 NOTE — Progress Notes (Signed)
 Clinical Intervention Note  Clinical Intervention Notes: Patient reported that he will start Bariatric vitamins on 02/24/24, no DDIs are anticipated with his Skyrizi .   Clinical Intervention Outcomes: Prevention of an adverse drug event   Julian Washington [name] [name]

## 2024-02-19 NOTE — Anesthesia Preprocedure Evaluation (Signed)
"                                    Anesthesia Evaluation  Patient identified by MRN, date of birth, ID band Patient awake    Reviewed: Allergy & Precautions, H&P , NPO status , Patient's Chart, lab work & pertinent test results  History of Anesthesia Complications (+) PONV, DIFFICULT AIRWAY and history of anesthetic complications  Airway Mallampati: IV  TM Distance: >3 FB Neck ROM: Full    Dental no notable dental hx. (+) Teeth Intact, Dental Advisory Given   Pulmonary sleep apnea and Continuous Positive Airway Pressure Ventilation    Pulmonary exam normal breath sounds clear to auscultation       Cardiovascular Exercise Tolerance: Good hypertension, Pt. on medications and Pt. on home beta blockers + dysrhythmias Atrial Fibrillation  Rhythm:Regular Rate:Normal     Neuro/Psych  Headaches  Anxiety Depression       GI/Hepatic Neg liver ROS,GERD  Medicated,,  Endo/Other  diabetes, Type 2, Oral Hypoglycemic Agents  Class 4 obesity  Renal/GU negative Renal ROS  negative genitourinary   Musculoskeletal   Abdominal   Peds  Hematology  (+) Blood dyscrasia, anemia   Anesthesia Other Findings   Reproductive/Obstetrics negative OB ROS                              Anesthesia Physical Anesthesia Plan  ASA: 4  Anesthesia Plan: General   Post-op Pain Management: Ketamine  IV*, Ofirmev  IV (intra-op)*, Lidocaine  infusion* and Toradol IV (intra-op)*   Induction: Intravenous  PONV Risk Score and Plan: 4 or greater and Ondansetron , Dexamethasone  and Midazolam   Airway Management Planned: Oral ETT and Video Laryngoscope Planned  Additional Equipment:   Intra-op Plan:   Post-operative Plan: Extubation in OR  Informed Consent: I have reviewed the patients History and Physical, chart, labs and discussed the procedure including the risks, benefits and alternatives for the proposed anesthesia with the patient or authorized representative  who has indicated his/her understanding and acceptance.     Dental advisory given  Plan Discussed with: CRNA  Anesthesia Plan Comments: (See PAT note from 1/7)         Anesthesia Quick Evaluation  "

## 2024-02-20 NOTE — Discharge Instructions (Signed)
 GASTRIC BYPASS / SLEEVE  Home Care Instructions  These instructions are to help you care for yourself when you go home.  Call: If you have any problems. Call [phone] and ask for the surgeon on call If you have an emergency related to your surgery please use the ER at [facility].  Tell the ER staff that you are a new post-op gastric bypass or gastric sleeve patient   Signs and symptoms to report: Severe vomiting or nausea If you cannot handle clear liquids for longer than 1 day, call your surgeon  Abdominal pain which does not get better after taking your pain medication Fever greater than 100.4 F and chills Heart rate over 100 beats a minute Trouble breathing Chest pain  Redness, swelling, drainage, or foul odor at incision (surgical) sites  If your incisions open or pull apart Swelling or pain in calf (lower leg) Diarrhea (Loose bowel movements that happen often), frequent watery, uncontrolled bowel movements Constipation, (no bowel movements for 3 days) if this happens:  Take Milk of Magnesia, 2 tablespoons by mouth, 3 times a day for 2 days if needed Stop taking Milk of Magnesia once you have had a bowel movement Call your doctor if constipation continues Or Take Miralax  (instead of Milk of Magnesia) following the label instructions Stop taking Miralax once you have had a bowel movement Call your doctor if constipation continues Anything you think is "abnormal for you"   Normal side effects after surgery: Unable to sleep at night or unable to concentrate Irritability Being tearful (crying) or depressed These are common complaints, possibly related to your anesthesia, stress of surgery and change in lifestyle, that usually go away a few weeks after surgery.  If these feelings continue, call your medical doctor.  Wound Care: You may have surgical glue, steri-strips, or staples over your incisions after surgery Surgical glue:  Looks like a clear film over your incisions  and will wear off a little at a time Steri-strips : Adhesive strips of tape over your incisions. You may notice a yellowish color on the skin under the steri-strips. This is used to make the   steri-strips stick better. Do not pull the steri-strips off - let them fall off Staples: Staples may be removed before you leave the hospital If you go home with staples, call [facility] at for an appointment with your surgeon's nurse to have staples removed 10 days after surgery, [phone] Showering: You may shower two (2) days after your surgery unless your surgeon tells you differently Wash gently around incisions with warm soapy water , rinse well, and gently pat dry  If you have a drain (tube from your incision), you may need someone to hold this while you shower  No tub baths until staples are removed and incisions are healed     Medications: Medications should be liquid or crushed if larger than the size of a dime Extended release pills (medication that releases a little bit at a time through the day) should not be crushed Depending on the size and number of medications you take, you may need to space (take a few throughout the day)/change the time you take your medications so that you do not over-fill your pouch (smaller stomach) Make sure you follow-up with your primary care physician to make medication changes needed during rapid weight loss and life-style changes If you have diabetes, follow up with the doctor that orders your diabetes medication(s) within one week after surgery and check  your blood sugar regularly. Do not drive while taking narcotics (pain medications) DO NOT take NSAID'S (Examples of NSAID's include ibuprofen, naproxen )  Diet:                    First 2 Weeks  You will see the nutritionist about two (2) weeks after your surgery. The nutritionist will increase the types of foods you can eat if you are handling liquids well: If you have severe vomiting or nausea  and cannot handle clear liquids lasting longer than 1 day, call your surgeon  Protein Shake Drink at least 2 ounces of shake 5-6 times per day Each serving of protein shakes (usually 8 - 12 ounces) should have a minimum of:  15 grams of protein  And no more than 5 grams of carbohydrate  Goal for protein each day: Men = 80 grams per day Women = 60 grams per day Protein powder may be added to fluids such as non-fat milk or Lactaid milk or Soy milk (limit to 35 grams added protein powder per serving)  Hydration Slowly increase the amount of water  and other clear liquids as tolerated (See Acceptable Fluids) Slowly increase the amount of protein shake as tolerated   Sip fluids slowly and throughout the day May use sugar substitutes in small amounts (no more than 6 - 8 packets per day; i.e. Splenda)  Fluid Goal The first goal is to drink at least 8 ounces of protein shake/drink per day (or as directed by the nutritionist);  See handout from pre-op Bariatric Education Class for examples of protein shake/drink.   Slowly increase the amount of protein shake you drink as tolerated You may find it easier to slowly sip shakes throughout the day It is important to get your proteins in first Your fluid goal is to drink 64 - 100 ounces of fluid daily It may take a few weeks to build up to this 32 oz (or more) should be clear liquids  And  32 oz (or more) should be full liquids (see below for examples) Liquids should not contain sugar, caffeine, or carbonation  Clear Liquids: Water  or Sugar-free flavored water  (i.e. Fruit H2O, Propel) Decaffeinated coffee or tea (sugar-free) Crystal Lite, Wyler's Lite, Minute Maid Lite Sugar-free Jell-O Bouillon or broth Sugar-free Popsicle:   *Less than 20 calories each; Limit 1 per day  Full Liquids: Protein Shakes/Drinks + 2 choices per day of other full liquids Full liquids must be: No More Than 12 grams of Carbs per serving  No More Than 3 grams of Fat  per serving Strained low-fat cream soup Non-Fat milk Fat-free Lactaid Milk Sugar-free yogurt (Dannon Lite & Fit, Greek yogurt)      Vitamins and Minerals Start 1 day after surgery unless otherwise directed by your surgeon Bariatric Specific Complete Multivitamins Chewable Calcium  Citrate with Vitamin D -3 (Example: 3 Chewable Calcium  Plus 600 with Vitamin D -3) Take 500 mg three (3) times a day for a total of 1500 mg each day Do not take all 3 doses of calcium  at one time as it may cause constipation, and you can only absorb 500 mg  at a time  Do not mix multivitamins containing iron with calcium  supplements; take 2 hours apart  Menstruating women and those at risk for anemia (a blood disease that causes weakness) may need extra iron Talk with your doctor to see if you need more iron If you need extra iron: Total daily Iron recommendation (including Vitamins) is 50 to 100  mg Iron/day Do not stop taking or change any vitamins or minerals until you talk to your nutritionist or surgeon Your nutritionist and/or surgeon must approve all vitamin and mineral supplements   Activity and Exercise: It is important to continue walking at home.  Limit your physical activity as instructed by your doctor.  During this time, use these guidelines: Do not lift anything greater than ten (10) pounds for at least two (2) weeks Do not go back to work or drive until Designer, industrial/product says you can You may have sex when you feel comfortable  It is VERY important for male patients to use a reliable birth control method; fertility often increases after surgery  Do not get pregnant for at least 18 months Start exercising as soon as your doctor tells you that you can Make sure your doctor approves any physical activity Start with a simple walking program Walk 5-15 minutes each day, 7 days per week.  Slowly increase until you are walking 30-45 minutes per day Consider joining our BELT program. [phone] or email  [email or web address]   Special Instructions Things to remember:  Use your CPAP when sleeping if this applies to you, do not stop the use of CPAP unless directed by physician after a sleep study [facility] has a free Bariatric Surgery Support Group that meets monthly, the 3rd Thursday, 6 pm.  Please review discharge information for date and location of this meeting. It is very important to keep all follow up appointments with your surgeon, nutritionist, primary care physician, and behavioral health practitioner After the first year, please follow up with your bariatric surgeon and nutritionist at least once a year in order to maintain best weight loss results   [facility]: [phone] [facility]: [phone] Bariatric Nurse Coordinator: [phone]

## 2024-02-23 ENCOUNTER — Inpatient Hospital Stay (HOSPITAL_COMMUNITY): Payer: Self-pay | Admitting: Anesthesiology

## 2024-02-23 ENCOUNTER — Other Ambulatory Visit: Payer: Self-pay

## 2024-02-23 ENCOUNTER — Encounter (HOSPITAL_COMMUNITY): Payer: Self-pay | Admitting: General Surgery

## 2024-02-23 ENCOUNTER — Encounter (HOSPITAL_COMMUNITY): Admission: RE | Payer: Self-pay | Source: Home / Self Care

## 2024-02-23 ENCOUNTER — Inpatient Hospital Stay (HOSPITAL_COMMUNITY)
Admission: RE | Admit: 2024-02-23 | Discharge: 2024-02-24 | DRG: 621 | Disposition: A | Discharge status: Home / Self Care | Attending: General Surgery | Admitting: General Surgery

## 2024-02-23 ENCOUNTER — Encounter (HOSPITAL_COMMUNITY): Payer: Self-pay | Admitting: Medical

## 2024-02-23 DIAGNOSIS — Z8249 Family history of ischemic heart disease and other diseases of the circulatory system: Secondary | ICD-10-CM | POA: Diagnosis not present

## 2024-02-23 DIAGNOSIS — K5903 Drug induced constipation: Secondary | ICD-10-CM | POA: Diagnosis present

## 2024-02-23 DIAGNOSIS — Z888 Allergy status to other drugs, medicaments and biological substances status: Secondary | ICD-10-CM | POA: Diagnosis not present

## 2024-02-23 DIAGNOSIS — Z885 Allergy status to narcotic agent status: Secondary | ICD-10-CM | POA: Diagnosis not present

## 2024-02-23 DIAGNOSIS — K760 Fatty (change of) liver, not elsewhere classified: Secondary | ICD-10-CM | POA: Diagnosis present

## 2024-02-23 DIAGNOSIS — Z7984 Long term (current) use of oral hypoglycemic drugs: Secondary | ICD-10-CM

## 2024-02-23 DIAGNOSIS — R008 Other abnormalities of heart beat: Secondary | ICD-10-CM | POA: Diagnosis present

## 2024-02-23 DIAGNOSIS — I872 Venous insufficiency (chronic) (peripheral): Secondary | ICD-10-CM | POA: Diagnosis present

## 2024-02-23 DIAGNOSIS — Z6841 Body Mass Index (BMI) 40.0 and over, adult: Secondary | ICD-10-CM

## 2024-02-23 DIAGNOSIS — Z833 Family history of diabetes mellitus: Secondary | ICD-10-CM | POA: Diagnosis not present

## 2024-02-23 DIAGNOSIS — Z7901 Long term (current) use of anticoagulants: Secondary | ICD-10-CM | POA: Diagnosis not present

## 2024-02-23 DIAGNOSIS — Z79899 Other long term (current) drug therapy: Secondary | ICD-10-CM | POA: Diagnosis not present

## 2024-02-23 DIAGNOSIS — Z91040 Latex allergy status: Secondary | ICD-10-CM

## 2024-02-23 DIAGNOSIS — I10 Essential (primary) hypertension: Secondary | ICD-10-CM

## 2024-02-23 DIAGNOSIS — I4891 Unspecified atrial fibrillation: Secondary | ICD-10-CM | POA: Diagnosis present

## 2024-02-23 DIAGNOSIS — Z7985 Long-term (current) use of injectable non-insulin antidiabetic drugs: Secondary | ICD-10-CM

## 2024-02-23 DIAGNOSIS — E119 Type 2 diabetes mellitus without complications: Secondary | ICD-10-CM | POA: Diagnosis present

## 2024-02-23 DIAGNOSIS — E785 Hyperlipidemia, unspecified: Secondary | ICD-10-CM | POA: Diagnosis present

## 2024-02-23 DIAGNOSIS — Z9884 Bariatric surgery status: Principal | ICD-10-CM

## 2024-02-23 DIAGNOSIS — Z887 Allergy status to serum and vaccine status: Secondary | ICD-10-CM | POA: Diagnosis not present

## 2024-02-23 DIAGNOSIS — G4733 Obstructive sleep apnea (adult) (pediatric): Secondary | ICD-10-CM | POA: Diagnosis present

## 2024-02-23 DIAGNOSIS — Z87892 Personal history of anaphylaxis: Secondary | ICD-10-CM

## 2024-02-23 DIAGNOSIS — I480 Paroxysmal atrial fibrillation: Secondary | ICD-10-CM | POA: Diagnosis present

## 2024-02-23 DIAGNOSIS — K219 Gastro-esophageal reflux disease without esophagitis: Secondary | ICD-10-CM | POA: Diagnosis present

## 2024-02-23 DIAGNOSIS — T383X5A Adverse effect of insulin and oral hypoglycemic [antidiabetic] drugs, initial encounter: Secondary | ICD-10-CM | POA: Diagnosis present

## 2024-02-23 DIAGNOSIS — Z91018 Allergy to other foods: Secondary | ICD-10-CM

## 2024-02-23 HISTORY — PX: UPPER GI ENDOSCOPY: SHX6162

## 2024-02-23 LAB — POCT I-STAT, CHEM 8
BUN: 13 mg/dL (ref 6–20)
Calcium, Ion: 1.25 mmol/L (ref 1.15–1.40)
Chloride: 103 mmol/L (ref 98–111)
Creatinine, Ser: 0.9 mg/dL (ref 0.61–1.24)
Glucose, Bld: 95 mg/dL (ref 70–99)
HCT: 38 % — ABNORMAL LOW (ref 39.0–52.0)
Hemoglobin: 12.9 g/dL — ABNORMAL LOW (ref 13.0–17.0)
Potassium: 4.6 mmol/L (ref 3.5–5.1)
Sodium: 140 mmol/L (ref 135–145)
TCO2: 23 mmol/L (ref 22–32)

## 2024-02-23 LAB — BASIC METABOLIC PANEL WITH GFR
Anion gap: 14 (ref 5–15)
BUN: 14 mg/dL (ref 6–20)
CO2: 19 mmol/L — ABNORMAL LOW (ref 22–32)
Calcium: 9.3 mg/dL (ref 8.9–10.3)
Chloride: 106 mmol/L (ref 98–111)
Creatinine, Ser: 1.01 mg/dL (ref 0.61–1.24)
GFR, Estimated: >60 mL/min
Glucose, Bld: 150 mg/dL — ABNORMAL HIGH (ref 70–99)
Potassium: 5.8 mmol/L — ABNORMAL HIGH (ref 3.5–5.1)
Sodium: 139 mmol/L (ref 135–145)

## 2024-02-23 LAB — GLUCOSE, CAPILLARY
Glucose-Capillary: 77 mg/dL (ref 70–99)
Glucose-Capillary: 153 mg/dL — ABNORMAL HIGH (ref 70–99)

## 2024-02-23 LAB — HEMOGLOBIN AND HEMATOCRIT, BLOOD
HCT: 40.9 % (ref 39.0–52.0)
Hemoglobin: 13.7 g/dL (ref 13.0–17.0)

## 2024-02-23 MED ORDER — FENTANYL CITRATE (PF) 100 MCG/2ML IJ SOLN
INTRAMUSCULAR | Status: AC
  Filled 2024-02-23: qty 2

## 2024-02-23 MED ORDER — SUCCINYLCHOLINE CHLORIDE 200 MG/10ML IV SOSY
PREFILLED_SYRINGE | INTRAVENOUS | Status: DC | PRN
  Administered 2024-02-23: 160 mg via INTRAVENOUS

## 2024-02-23 MED ORDER — HYDROMORPHONE HCL 1 MG/ML IJ SOLN
INTRAMUSCULAR | Status: AC
  Filled 2024-02-23: qty 1

## 2024-02-23 MED ORDER — MIDAZOLAM HCL 5 MG/5ML IJ SOLN
INTRAMUSCULAR | Status: DC | PRN
  Administered 2024-02-23: 2 mg via INTRAVENOUS

## 2024-02-23 MED ORDER — ACETAMINOPHEN 500 MG PO TABS
1000.0000 mg | ORAL_TABLET | Freq: Three times a day (TID) | ORAL | Status: DC
  Administered 2024-02-23 – 2024-02-24 (×3): 1000 mg via ORAL
  Filled 2024-02-23 (×3): qty 2

## 2024-02-23 MED ORDER — PROPOFOL 10 MG/ML IV BOLUS
INTRAVENOUS | Status: DC | PRN
  Administered 2024-02-23: 270 mg via INTRAVENOUS

## 2024-02-23 MED ORDER — LIDOCAINE HCL 2 % IJ SOLN
INTRAMUSCULAR | Status: AC
  Filled 2024-02-23: qty 20

## 2024-02-23 MED ORDER — ONDANSETRON HCL 4 MG/2ML IJ SOLN
INTRAMUSCULAR | Status: AC
  Filled 2024-02-23: qty 2

## 2024-02-23 MED ORDER — APREPITANT 40 MG PO CAPS
40.0000 mg | ORAL_CAPSULE | ORAL | Status: AC
  Administered 2024-02-23: 40 mg via ORAL
  Filled 2024-02-23: qty 1

## 2024-02-23 MED ORDER — SUGAMMADEX SODIUM 200 MG/2ML IV SOLN
INTRAVENOUS | Status: AC
  Filled 2024-02-23: qty 4

## 2024-02-23 MED ORDER — ROCURONIUM BROMIDE 10 MG/ML (PF) SYRINGE
PREFILLED_SYRINGE | INTRAVENOUS | Status: AC
  Filled 2024-02-23: qty 10

## 2024-02-23 MED ORDER — BUPIVACAINE-EPINEPHRINE (PF) 0.25% -1:200000 IJ SOLN
INTRAMUSCULAR | Status: AC
  Filled 2024-02-23: qty 60

## 2024-02-23 MED ORDER — ACETAMINOPHEN 160 MG/5ML PO SOLN
1000.0000 mg | Freq: Three times a day (TID) | ORAL | Status: DC
  Administered 2024-02-24: 1000 mg via ORAL
  Filled 2024-02-23: qty 40.6

## 2024-02-23 MED ORDER — ONDANSETRON HCL 4 MG/2ML IJ SOLN: 4.0000 mg | Freq: Four times a day (QID) | INTRAMUSCULAR | Status: DC | PRN

## 2024-02-23 MED ORDER — ALVIMOPAN 12 MG PO CAPS
ORAL_CAPSULE | ORAL | Status: AC
  Filled 2024-02-23: qty 1

## 2024-02-23 MED ORDER — HEPARIN SODIUM (PORCINE) 5000 UNIT/ML IJ SOLN: 5000.0000 [IU] | INTRAMUSCULAR | Status: AC

## 2024-02-23 MED ORDER — SODIUM CHLORIDE 0.9 % IV SOLN
2.0000 g | INTRAVENOUS | Status: AC
  Administered 2024-02-23: 2 g via INTRAVENOUS
  Filled 2024-02-23: qty 2

## 2024-02-23 MED ORDER — HEPARIN SODIUM (PORCINE) 5000 UNIT/ML IJ SOLN
5000.0000 [IU] | Freq: Three times a day (TID) | INTRAMUSCULAR | Status: DC
  Administered 2024-02-23 – 2024-02-24 (×3): 5000 [IU] via SUBCUTANEOUS
  Filled 2024-02-23 (×3): qty 1

## 2024-02-23 MED ORDER — PANTOPRAZOLE SODIUM 40 MG IV SOLR
40.0000 mg | Freq: Every day | INTRAVENOUS | Status: DC
  Administered 2024-02-23: 40 mg via INTRAVENOUS
  Filled 2024-02-23: qty 10

## 2024-02-23 MED ORDER — SCOPOLAMINE 1 MG/3DAYS TD PT72: 1.0000 | MEDICATED_PATCH | TRANSDERMAL | Status: DC

## 2024-02-23 MED ORDER — HEPARIN SODIUM (PORCINE) 5000 UNIT/ML IJ SOLN
INTRAMUSCULAR | Status: AC
  Administered 2024-02-23: 5000 [IU] via SUBCUTANEOUS
  Filled 2024-02-23: qty 1

## 2024-02-23 MED ORDER — ONDANSETRON HCL 4 MG/2ML IJ SOLN
INTRAMUSCULAR | Status: DC | PRN
  Administered 2024-02-23: 4 mg via INTRAVENOUS

## 2024-02-23 MED ORDER — DEXAMETHASONE SOD PHOSPHATE PF 10 MG/ML IJ SOLN
INTRAMUSCULAR | Status: AC
  Filled 2024-02-23: qty 1

## 2024-02-23 MED ORDER — EPHEDRINE SULFATE (PRESSORS) 25 MG/5ML IV SOSY
PREFILLED_SYRINGE | INTRAVENOUS | Status: DC | PRN
  Administered 2024-02-23: 15 mg via INTRAVENOUS
  Administered 2024-02-23: 10 mg via INTRAVENOUS

## 2024-02-23 MED ORDER — BUPIVACAINE-EPINEPHRINE 0.25% -1:200000 IJ SOLN
INTRAMUSCULAR | Status: DC | PRN
  Administered 2024-02-23 (×2): 30 mL

## 2024-02-23 MED ORDER — ROCURONIUM BROMIDE 10 MG/ML (PF) SYRINGE
PREFILLED_SYRINGE | INTRAVENOUS | Status: DC | PRN
  Administered 2024-02-23: 70 mg via INTRAVENOUS
  Administered 2024-02-23 (×2): 30 mg via INTRAVENOUS
  Administered 2024-02-23: 20 mg via INTRAVENOUS

## 2024-02-23 MED ORDER — FLECAINIDE ACETATE 100 MG PO TABS
100.0000 mg | ORAL_TABLET | Freq: Two times a day (BID) | ORAL | Status: DC
  Administered 2024-02-23 – 2024-02-24 (×2): 100 mg via ORAL
  Filled 2024-02-23 (×3): qty 1

## 2024-02-23 MED ORDER — ENSURE MAX PROTEIN PO LIQD
2.0000 [oz_av] | ORAL | Status: DC
  Administered 2024-02-24 (×4): 2 [oz_av] via ORAL

## 2024-02-23 MED ORDER — GABAPENTIN 100 MG PO CAPS
100.0000 mg | ORAL_CAPSULE | Freq: Two times a day (BID) | ORAL | Status: DC
  Administered 2024-02-23: 100 mg via ORAL
  Filled 2024-02-23 (×3): qty 1

## 2024-02-23 MED ORDER — GABAPENTIN 100 MG PO CAPS
100.0000 mg | ORAL_CAPSULE | ORAL | Status: AC
  Administered 2024-02-23: 100 mg via ORAL
  Filled 2024-02-23: qty 1

## 2024-02-23 MED ORDER — CHLORHEXIDINE GLUCONATE 4 % EX SOLN: Freq: Once | CUTANEOUS | Status: DC

## 2024-02-23 MED ORDER — MORPHINE SULFATE (PF) 2 MG/ML IV SOLN: 1.0000 mg | INTRAVENOUS | Status: DC | PRN

## 2024-02-23 MED ORDER — STERILE WATER FOR IRRIGATION IR SOLN
Status: DC | PRN
  Administered 2024-02-23: 1000 mL

## 2024-02-23 MED ORDER — SIMETHICONE 80 MG PO CHEW
80.0000 mg | CHEWABLE_TABLET | Freq: Four times a day (QID) | ORAL | Status: DC | PRN
  Administered 2024-02-24: 80 mg via ORAL
  Filled 2024-02-23 (×2): qty 1

## 2024-02-23 MED ORDER — SODIUM CHLORIDE 0.9 % IV SOLN: 12.5000 mg | Freq: Four times a day (QID) | INTRAVENOUS | Status: DC | PRN

## 2024-02-23 MED ORDER — SODIUM CHLORIDE 0.9 % IV SOLN
INTRAVENOUS | Status: AC
  Filled 2024-02-23: qty 2

## 2024-02-23 MED ORDER — LIDOCAINE HCL (PF) 2 % IJ SOLN
INTRAMUSCULAR | Status: DC | PRN
  Administered 2024-02-23: 100 mg via INTRADERMAL

## 2024-02-23 MED ORDER — GABAPENTIN 300 MG PO CAPS
ORAL_CAPSULE | ORAL | Status: AC
  Filled 2024-02-23: qty 1

## 2024-02-23 MED ORDER — FENTANYL CITRATE (PF) 100 MCG/2ML IJ SOLN
INTRAMUSCULAR | Status: DC | PRN
  Administered 2024-02-23: 100 ug via INTRAVENOUS

## 2024-02-23 MED ORDER — LACTATED RINGERS IV SOLN: INTRAVENOUS | Status: DC | PRN

## 2024-02-23 MED ORDER — DROPERIDOL 2.5 MG/ML IJ SOLN
0.6250 mg | Freq: Once | INTRAMUSCULAR | Status: AC
  Administered 2024-02-23: 0.625 mg via INTRAVENOUS

## 2024-02-23 MED ORDER — SUCCINYLCHOLINE CHLORIDE 200 MG/10ML IV SOSY
PREFILLED_SYRINGE | INTRAVENOUS | Status: AC
  Filled 2024-02-23: qty 10

## 2024-02-23 MED ORDER — DEXAMETHASONE SOD PHOSPHATE PF 10 MG/ML IJ SOLN: 4.0000 mg | INTRAMUSCULAR | Status: DC

## 2024-02-23 MED ORDER — ACETAMINOPHEN 500 MG PO TABS
ORAL_TABLET | ORAL | Status: AC
  Administered 2024-02-23: 1000 mg via ORAL
  Filled 2024-02-23: qty 2

## 2024-02-23 MED ORDER — ACETAMINOPHEN 500 MG PO TABS
1000.0000 mg | ORAL_TABLET | ORAL | Status: AC
  Filled 2024-02-23: qty 2

## 2024-02-23 MED ORDER — HYDROMORPHONE HCL 1 MG/ML IJ SOLN
0.2500 mg | INTRAMUSCULAR | Status: DC | PRN
  Administered 2024-02-23 (×3): 0.5 mg via INTRAVENOUS

## 2024-02-23 MED ORDER — CHLORHEXIDINE GLUCONATE 0.12 % MT SOLN
15.0000 mL | Freq: Once | OROMUCOSAL | Status: AC
  Administered 2024-02-23: 15 mL via OROMUCOSAL

## 2024-02-23 MED ORDER — SUGAMMADEX SODIUM 200 MG/2ML IV SOLN
INTRAVENOUS | Status: DC | PRN
  Administered 2024-02-23: 400 mg via INTRAVENOUS

## 2024-02-23 MED ORDER — METOPROLOL TARTRATE 50 MG PO TABS
50.0000 mg | ORAL_TABLET | Freq: Two times a day (BID) | ORAL | Status: DC
  Administered 2024-02-23 – 2024-02-24 (×2): 50 mg via ORAL
  Filled 2024-02-23 (×2): qty 1

## 2024-02-23 MED ORDER — DROPERIDOL 2.5 MG/ML IJ SOLN
INTRAMUSCULAR | Status: AC
  Filled 2024-02-23: qty 2

## 2024-02-23 MED ORDER — SCOPOLAMINE 1 MG/3DAYS TD PT72
MEDICATED_PATCH | TRANSDERMAL | Status: AC
  Administered 2024-02-23: 1 mg via TRANSDERMAL
  Filled 2024-02-23: qty 1

## 2024-02-23 MED ORDER — SODIUM CHLORIDE 0.9 % IR SOLN
Status: DC | PRN
  Administered 2024-02-23: 1000 mL

## 2024-02-23 MED ORDER — LIDOCAINE HCL (PF) 2 % IJ SOLN
INTRAMUSCULAR | Status: AC
  Filled 2024-02-23: qty 5

## 2024-02-23 MED ORDER — MIDAZOLAM HCL 2 MG/2ML IJ SOLN
INTRAMUSCULAR | Status: AC
  Filled 2024-02-23: qty 2

## 2024-02-23 MED ORDER — TRAMADOL HCL 50 MG PO TABS
50.0000 mg | ORAL_TABLET | Freq: Four times a day (QID) | ORAL | Status: DC | PRN
  Administered 2024-02-24: 50 mg via ORAL
  Administered 2024-02-24: 100 mg via ORAL
  Filled 2024-02-23 (×2): qty 2

## 2024-02-23 MED ORDER — FIBRIN SEALANT 2 ML SINGLE DOSE KIT
2.0000 mL | PACK | Freq: Once | CUTANEOUS | Status: AC
  Administered 2024-02-23: 2 mL via TOPICAL
  Filled 2024-02-23: qty 2

## 2024-02-23 MED ORDER — LACTATED RINGERS IR SOLN
Status: DC | PRN
  Administered 2024-02-23: 1000 mL

## 2024-02-23 MED ORDER — ORAL CARE MOUTH RINSE: 15.0000 mL | Freq: Once | OROMUCOSAL | Status: AC

## 2024-02-23 MED ORDER — KETAMINE HCL 50 MG/5ML IJ SOSY
PREFILLED_SYRINGE | INTRAMUSCULAR | Status: DC | PRN
  Administered 2024-02-23: 30 mg via INTRAVENOUS
  Administered 2024-02-23: 20 mg via INTRAVENOUS

## 2024-02-23 MED ORDER — INSULIN ASPART 100 UNIT/ML IJ SOLN: 0.0000 [IU] | INTRAMUSCULAR | Status: DC | PRN

## 2024-02-23 MED ORDER — DEXAMETHASONE SOD PHOSPHATE PF 10 MG/ML IJ SOLN
INTRAMUSCULAR | Status: DC | PRN
  Administered 2024-02-23: 10 mg via INTRAVENOUS

## 2024-02-23 MED ORDER — "VISTASEAL 4 ML SINGLE DOSE KIT "
4.0000 mL | PACK | Freq: Once | CUTANEOUS | Status: AC
  Administered 2024-02-23: 4 mL via TOPICAL
  Filled 2024-02-23: qty 4

## 2024-02-23 MED ORDER — LIDOCAINE HCL (PF) 2 % IJ SOLN
INTRAMUSCULAR | Status: DC | PRN
  Administered 2024-02-23: 1.5 mg/kg/h

## 2024-02-23 MED ORDER — LOSARTAN POTASSIUM 50 MG PO TABS
100.0000 mg | ORAL_TABLET | Freq: Every day | ORAL | Status: DC
  Administered 2024-02-24: 100 mg via ORAL
  Filled 2024-02-23: qty 2

## 2024-02-23 MED ORDER — KCL IN DEXTROSE-NACL 20-5-0.45 MEQ/L-%-% IV SOLN
INTRAVENOUS | Status: DC
  Filled 2024-02-23 (×5): qty 1000

## 2024-02-23 MED ORDER — KETAMINE HCL 50 MG/5ML IJ SOSY
PREFILLED_SYRINGE | INTRAMUSCULAR | Status: AC
  Filled 2024-02-23: qty 5

## 2024-02-23 MED ORDER — HYDRALAZINE HCL 20 MG/ML IJ SOLN: 10.0000 mg | INTRAMUSCULAR | Status: DC | PRN

## 2024-02-23 MED ORDER — LACTATED RINGERS IV SOLN: INTRAVENOUS | Status: DC

## 2024-02-23 NOTE — Transfer of Care (Signed)
 Immediate Anesthesia Transfer of Care Note  Patient: Julian Washington  Procedure(s) Performed: CREATION, GASTRIC BYPASS, LAPAROSCOPIC WITH ROUX-EN-Y GASTROENTEROSTOMY (Abdomen) ENDOSCOPY, UPPER GI TRACT  Patient Location: PACU  Anesthesia Type:General  Level of Consciousness: sedated  Airway & Oxygen  Therapy: Patient Spontanous Breathing and Patient connected to face mask oxygen   Post-op Assessment: Report given to RN and Post -op Vital signs reviewed and stable  Post vital signs: Reviewed and stable  Last Vitals:  Vitals Value Taken Time  BP 122/62 02/23/24 10:41  Temp    Pulse 77 02/23/24 10:43  Resp 23 02/23/24 10:43  SpO2 100 % 02/23/24 10:43  Vitals shown include unfiled device data.  Last Pain:  Vitals:   02/23/24 0625  TempSrc:   PainSc: 0-No pain         Complications: No notable events documented.

## 2024-02-23 NOTE — Interval H&P Note (Signed)
 History and Physical Interval Note:  02/23/2024 7:23 AM  Julian Washington  has presented today for surgery, with the diagnosis of MORBID OBESITY.  The various methods of treatment have been discussed with the patient and family. After consideration of risks, benefits and other options for treatment, the patient has consented to  Procedures: CREATION, GASTRIC BYPASS, LAPAROSCOPIC WITH ROUX-EN-Y GASTROENTEROSTOMY (N/A) ENDOSCOPY, UPPER GI TRACT (N/A) as a surgical intervention.  The patient's history has been reviewed, patient examined, no change in status, stable for surgery.  I have reviewed the patient's chart and labs.  Questions were answered to the patient's satisfaction.    Stopped eliquis  Friday night -  last dose [name]

## 2024-02-23 NOTE — Anesthesia Procedure Notes (Signed)
 Procedure Name: Intubation Date/Time: 02/23/2024 7:54 AM  Performed by: Carleton Garnette SAUNDERS, CRNAPre-anesthesia Checklist: Patient identified, Emergency Drugs available, Suction available, Patient being monitored and Timeout performed Patient Re-evaluated:Patient Re-evaluated prior to induction Oxygen  Delivery Method: Circle system utilized Preoxygenation: Pre-oxygenation with 100% oxygen  Induction Type: IV induction Ventilation: Mask ventilation without difficulty Laryngoscope Size: Mac, 4 and Glidescope (Known difficult intubation) Grade View: Grade I Tube type: Oral Tube size: 8.0 mm Number of attempts: 1 Airway Equipment and Method: Stylet Placement Confirmation: ETT inserted through vocal cords under direct vision, positive ETCO2 and breath sounds checked- equal and bilateral Secured at: 23 cm Tube secured with: Tape Dental Injury: Teeth and Oropharynx as per pre-operative assessment

## 2024-02-23 NOTE — H&P (Signed)
 PROVIDER: Shantay Sonn DARALYN BLUSH, MD  MRN: [id number] DOB: May 10, 1980 DATE OF ENCOUNTER: 01/28/2024 Subjective  Chief Complaint: Obesity   History of Present Illness Julian Washington is a 44 year old male with atrial bigeminy and obesity who presents for preoperative evaluation for gastric bypass surgery. He is accompanied by his wife, Rosaline.  He is scheduled for gastric bypass surgery on January 12th and has recently attended a preoperative education class. He has questions regarding medication adjustments and postoperative care.  He has a history of atrial bigeminy and is currently on Eliquis . He recently consulted with cardiology, who advised no changes to his current medication regimen. No chest pain, chest pressure, or shortness of breath. He is concerned about the potential need for an awake intubation due to a previous experience.  He is taking Mounjaro  for weight management, which has caused constipation. Mounjaro  is more tolerable than Ozempic , as it does not cause headaches.  He has a history of sleep apnea, with previous sleep studies showing significant oxygen  desaturation. He plans to use his CPAP postoperatively to prevent complications.  He is taking omeprazole  for acid reflux. He is also considering multivitamins and protein supplements. No heartburn, vomiting, or regurgitation.  Review of Systems: A complete review of systems was obtained from the patient. I have reviewed this information and discussed as appropriate with the patient. See HPI as well for other ROS.  ROS  Medical History: Past Medical History: Diagnosis Date Anemia Anxiety Arrhythmia Diabetes mellitus without complication (CMS/HHS-HCC) GERD (gastroesophageal reflux disease) Hyperlipidemia Hypertension Sleep apnea  Patient Active Problem List Diagnosis Dyslipidemia Gastroesophageal reflux disease Chronic venous insufficiency of lower extremity HTN (hypertension) OSA on CPAP Paroxysmal A-fib  (CMS/HHS-HCC) Type 2 diabetes mellitus without complication, without long-term current use of insulin  (CMS/HHS-HCC)  Past Surgical History: Procedure Laterality Date CRANIOTOMY FOR HYPOPHYSECTOMY TONSILLECTOMY & ADENOIDECTOMY   Allergies Allergen Reactions Flu A(H5n1)Vacc(24mos)-Mf59c-Pf Anaphylaxis Dextran Other (See Comments) MUSCLE WEAKNESS Hydrocodone Itching Levaquin [Levofloxacin] Other (See Comments) ANGIOEDEMA Oxycodone Itching  Current Outpatient Medications on File Prior to Visit Medication Sig Dispense Refill apixaban  (ELIQUIS ) 5 mg tablet Take 5 mg by mouth 2 (two) times daily flecainide  (TAMBOCOR ) 100 MG tablet Take 100 mg by mouth every 12 (twelve) hours fluticasone  propionate (FLONASE ) 50 mcg/actuation nasal spray Place 2 sprays into one nostril once daily losartan  (COZAAR ) 50 MG tablet magnesium  oxide 400 mg magnesium  Tab Take 400 mg by mouth once daily metFORMIN  (GLUCOPHAGE -XR) 500 MG XR tablet Take 1,000 mg by mouth daily with breakfast metoprolol  TARTrate (LOPRESSOR ) 50 MG tablet Take 50 mg by mouth 2 (two) times daily omeprazole  (PRILOSEC) 40 MG DR capsule Take 40 mg by mouth every morning before breakfast rosuvastatin  (CRESTOR ) 10 MG tablet Take 10 mg by mouth at bedtime SKYRIZI  150 mg/mL PnIj Inject 1 pen injector subcutaneously every three months tirzepatide  (MOUNJARO ) 15 mg/0.5 mL pen injector Inject 15 mg subcutaneously once a week cetirizine  (ZYRTEC ) 10 MG tablet Take 10 mg by mouth once daily  No current facility-administered medications on file prior to visit.  Family History Problem Relation Age of Onset Obesity Mother High blood pressure (Hypertension) Maternal Grandmother Diabetes Maternal Grandmother Hyperlipidemia (Elevated cholesterol) Maternal Grandfather High blood pressure (Hypertension) Maternal Grandfather Diabetes Maternal Grandfather High blood pressure (Hypertension) Paternal Grandmother Diabetes Paternal Grandmother High blood  pressure (Hypertension) Paternal Grandfather Diabetes Paternal Grandfather   Social History  Tobacco Use Smoking Status Never Smokeless Tobacco Never   Social History  Socioeconomic History Marital status: Married Tobacco Use Smoking status: Never Smokeless tobacco:  Never Vaping Use Vaping status: Never Used Substance and Sexual Activity Drug use: Never  Social Drivers of Catering Manager Strain: Low Risk (12/09/2023) Received from [facility] Health Overall Financial Resource Strain (CARDIA) How hard is it for you to pay for the very basics like food, housing, medical care, and heating?: Not hard at all Food Insecurity: No Food Insecurity (12/09/2023) Received from [facility] Hunger Vital Sign Within the past 12 months, you worried that your food would run out before you got the money to buy more.: Never true Within the past 12 months, the food you bought just didn't last and you didn't have money to get more.: Never true Transportation Needs: No Transportation Needs (12/09/2023) Received from [facility] - Transportation In the past 12 months, has lack of transportation kept you from medical appointments or from getting medications?: No In the past 12 months, has lack of transportation kept you from meetings, work, or from getting things needed for daily living?: No Physical Activity: Inactive (12/09/2023) Received from [facility] Exercise Vital Sign On average, how many days per week do you engage in moderate to strenuous exercise (like a brisk walk)?: 0 days Stress: No Stress Concern Present (12/09/2023) Received from [facility] - Occupational Stress Questionnaire Do you feel stress - tense, restless, nervous, or anxious, or unable to sleep at night because your mind is troubled all the time - these days?: Only a little Social Connections: Socially Integrated (12/09/2023) Received from [facility] Social  Connection and Isolation Panel In a typical week, how many times do you talk on the phone with family, friends, or neighbors?: More than three times a week How often do you get together with friends or relatives?: Once a week How often do you attend church or religious services?: More than 4 times per year Do you belong to any clubs or organizations such as church groups, unions, fraternal or athletic groups, or school groups?: Yes How often do you attend meetings of the clubs or organizations you belong to?: More than 4 times per year Are you married, widowed, divorced, separated, never married, or living with a partner?: Married Housing Stability: Unknown (03/28/2023) Housing Stability Vital Sign Homeless in the Last Year: No  Objective:  Vitals: 01/28/24 0844 BP: (!) 142/84 Pulse: 96 Resp: 18 Temp: 36.5 C (97.7 F) SpO2: 98% Weight: (!) 169.6 kg (374 lb) Height: 175.3 cm (5' 9) PainSc: 0-No pain  Body mass index is 55.23 kg/m.  Wt Readings from Last 10 Encounters: 01/28/24 (!) 169.6 kg (374 lb) 03/28/23 (!) 176.2 kg (388 lb 6.4 oz)  Gen: alert, NAD, non-toxic appearing; severe obesity Pupils: equal, no scleral icterus Pulm: Lungs clear to auscultation, symmetric chest rise CV: regular rate and rhythm Abd: soft, nontender, nondistended.No incisional hernia Ext: no edema, Skin: no rash, no jaundice  Labs, Imaging and Diagnostic Testing:  Cards clearance 05/2023 04/14/23 - upper gi and cxr ok Component Latest Ref Rng 04/02/2023 WBC 3.4 - 10.8 x10E3/uL 6.6 RBC 4.14 - 5.80 x10E6/uL 5.14 Hemoglobin 13.0 - 17.7 g/dL 86.1 Hematocrit 62.4 - 51.0 % 42.3 MCV 79 - 97 fL 82 MCH (Mean Corpuscular Hemoglobin) 26.6 - 33.0 pg 26.8 MCHC 31.5 - 35.7 g/dL 67.3 RDW - [facility] 88.3 - 15.4 % 13.2 Platelet Count /L 150 - 450 x10E3/uL 192 Neutrophils - [facility] Not Estab. % 62 LYMPHS - [facility] Not Estab. % 28 Monocyte % Not Estab. % 6 Eosinophil % Not Estab. %  2  Basophil% Not Estab. % 1 Neutrophils 1.4 - 7.0 x10E3/uL 4.1 Lymphocyte Count 0.7 - 3.1 x10E3/uL 1.9 Monocyte Count 0.1 - 0.9 x10E3/uL 0.4 Eosinophils 0.0 - 0.4 x10E3/uL 0.1 Basophils 0.0 - 0.2 x10E3/uL 0.0 Immature Granulocyte % Not Estab. % 1 Immature Granulocyte Count 0.0 - 0.1 x10E3/uL 0.0 Cholesterol, Total - [facility] 100 - 199 mg/dL 892 Triglycerides - [facility] 0 - 149 mg/dL 894 HDL Cholesterol - [facility] >60 mg/dL 43 VLDL Cholesterol Cal - [facility] 5 - 40 mg/dL 20 Low Density Lipoprotein - [facility] 0 - 99 mg/dL 44 TIBC - [facility] 749 - 450 ug/dL 644 UIBC - [facility] 888 - 343 ug/dL 711 Iron - [facility] 38 - 169 ug/dL 67 Iron Saturation - [facility] 15 - 55 % 19 Vitamin D , 25-Hydroxy - [facility] 30.0 - 100.0 ng/mL 25.7 (L) Hemoglobin A1C - [facility] 4.8 - 5.6 % 5.9 (H) Vitamin B12 - [facility] 232 - 1,245 pg/mL 418  Legend: (L) Low (H) High  Cardiology office note 01/20/24 PCP note 11/2923  Labs December 10, 2023-vitamin D , hemoglobin A1c, CBC, basic metabolic panel, magnesium , B12 and folate; hemoglobin A1c 6.1 Assessment and Plan: Diagnoses and all orders for this visit:  Paroxysmal A-fib (CMS/HHS-HCC)  OSA on CPAP  Primary hypertension  Chronic venous insufficiency of lower extremity  Dyslipidemia  Gastroesophageal reflux disease, unspecified whether esophagitis present  Type 2 diabetes mellitus without complication, without long-term current use of insulin  (CMS/HHS-HCC)  Hepatic steatosis    Assessment & Plan Severe obesity Scheduled for gastric bypass surgery on January 12th. Preoperative education completed. Discussed medication adjustments post-surgery, including metformin  and omeprazole . Emphasized adherence to preoperative meal plan to shrink liver and facilitate surgery. Discussed postoperative expectations, including potential for fatigue and constipation. Advised on liquid intake post-surgery and potential need for outpatient IV fluids if unable  to maintain oral intake. Discussed lifting restrictions and return to work timeline. Addressed concerns about awake intubation and reassured based on past experience. Discussed potential need for CPAP during recovery due to obstructive sleep apnea. - Continue preoperative meal plan to shrink liver. - Stop Eliquis  two days before surgery. - Will administer heparin  preoperatively and continue postoperatively. - typically Resume Eliquis  two days post-surgery if no bleeding. - Monitor blood count in hospital post-surgery. - Use Miralax for constipation post-surgery. - Consider alkaline water  if difficulty swallowing liquids post-surgery. - Avoid bulk purchases of supplements post-surgery. - Use CPAP during recovery if needed.  I offered to rediscuss steps of surgery along with risk and benefits but he declined. He read over the surgical consent form today and signed. He had no additional questions regarding surgery. We did we discussed the risk of blood clots after surgery. We also discussed that he is slight increased risk for bleeding due to his Eliquis  and need to get back on it  Preparation for bariatric surgery Preoperative education completed. Discussed medication adjustments post-surgery, including metformin  and omeprazole . Emphasized adherence to preoperative meal plan to shrink liver and facilitate surgery. Discussed postoperative expectations, including potential for fatigue and constipation. Advised on liquid intake post-surgery and potential need for outpatient IV fluids if unable to maintain oral intake. Discussed lifting restrictions and return to work timeline. Addressed concerns about awake intubation and reassured based on past experience. Discussed potential need for CPAP during recovery due to obstructive sleep apnea. - Continue preoperative meal plan to shrink liver. - Stop Eliquis  two days before surgery. - Will administer heparin  preoperatively and continue postoperatively. -  Resume Eliquis  two days post-surgery. - Monitor blood count  in hospital post-surgery. - Use Miralax for constipation post-surgery. - Consider alkaline water  if difficulty swallowing liquids post-surgery. - Avoid bulk purchases of supplements post-surgery. - Use CPAP during recovery if needed.  Paroxysmal atrial fibrillation Recent cardiology evaluation showed no changes. No need for bridging therapy with Eliquis . Discussed continuation of Eliquis  post-surgery with monitoring for bleeding risk. - Continue Eliquis  post-surgery with monitoring for bleeding risk.  Obstructive sleep apnea Discussed potential need for CPAP during recovery due to obstructive sleep apnea. - Use CPAP during recovery if needed.  Type 2 diabetes mellitus Hemoglobin A1c is well controlled. Discussed potential discontinuation of metformin  post-surgery. - Will discontinue metformin  post-surgery.  Gastroesophageal reflux disease No current symptoms of heartburn, vomiting, or regurgitation. Discussed omeprazole  administration post-surgery. - Continue omeprazole  post-surgery.  Hyperlipidemia Cholesterol panel is well controlled. - Continue current management.  This patient encounter took 25 minutes today to perform the following: take history, perform exam, review outside records, interpret imaging, counsel the patient on their diagnosis and document encounter, findings & plan in the EHR  No follow-ups on file.  This note has been created using automated tools and reviewed for accuracy by Lemmie Vanlanen MCADAMS Vernor Monnig.  Camellia HERO. Tanda MD FACS General, Minimally Invasive, & Bariatric Surgery Electronically signed by Tanda Camellia Armour, MD at 01/28/2024 1:01 PM EST

## 2024-02-23 NOTE — Plan of Care (Signed)
" °  Problem: Education: Goal: Knowledge of General Education information will improve Description: Including pain rating scale, medication(s)/side effects and non-pharmacologic comfort measures Outcome: Progressing   Problem: Health Behavior/Discharge Planning: Goal: Ability to manage health-related needs will improve Outcome: Progressing   Problem: Clinical Measurements: Goal: Ability to maintain clinical measurements within normal limits will improve Outcome: Progressing Goal: Will remain free from infection Outcome: Progressing Goal: Diagnostic test results will improve Outcome: Progressing Goal: Respiratory complications will improve Outcome: Progressing Goal: Cardiovascular complication will be avoided Outcome: Progressing   Problem: Activity: Goal: Risk for activity intolerance will decrease Outcome: Progressing   Problem: Nutrition: Goal: Adequate nutrition will be maintained Outcome: Progressing   Problem: Coping: Goal: Level of anxiety will decrease Outcome: Progressing   Problem: Elimination: Goal: Will not experience complications related to bowel motility Outcome: Progressing Goal: Will not experience complications related to urinary retention Outcome: Progressing   Problem: Pain Managment: Goal: General experience of comfort will improve and/or be controlled Outcome: Progressing   Problem: Safety: Goal: Ability to remain free from injury will improve Outcome: Progressing   Problem: Skin Integrity: Goal: Risk for impaired skin integrity will decrease Outcome: Progressing   Problem: Education: Goal: Knowledge of the prescribed therapeutic regimen will improve Outcome: Progressing   Problem: Bowel/Gastric: Goal: Gastrointestinal status for postoperative course will improve Outcome: Progressing   Problem: Cardiac: Goal: Ability to maintain an adequate cardiac output Outcome: Progressing Goal: Will show no evidence of cardiac arrhythmias Outcome:  Progressing   Problem: Nutritional: Goal: Will attain and maintain optimal nutritional status Outcome: Progressing   Problem: Neurological: Goal: Will regain or maintain usual level of consciousness Outcome: Progressing   Problem: Clinical Measurements: Goal: Ability to maintain clinical measurements within normal limits Outcome: Progressing Goal: Postoperative complications will be avoided or minimized Outcome: Progressing   Problem: Respiratory: Goal: Will regain and/or maintain adequate ventilation Outcome: Progressing Goal: Respiratory status will improve Outcome: Progressing   Problem: Skin Integrity: Goal: Demonstrates signs of wound healing without infection Outcome: Progressing   Problem: Urinary Elimination: Goal: Will remain free from infection Outcome: Progressing Goal: Ability to achieve and maintain adequate urine output Outcome: Progressing   Problem: Education: Goal: Ability to state signs and symptoms to report to health care provider will improve Outcome: Progressing Goal: Knowledge of the prescribed self-care regimen will improve Outcome: Progressing Goal: Knowledge of discharge needs will improve Outcome: Progressing   Problem: Activity: Goal: Ability to tolerate increased activity will improve Outcome: Progressing   Problem: Bowel/Gastric: Goal: Gastrointestinal status for postoperative course will improve Outcome: Progressing Goal: Occurrences of nausea will decrease Outcome: Progressing   Problem: Coping: Goal: Development of coping mechanisms to deal with changes in body function or appearance will improve Outcome: Progressing   Problem: Fluid Volume: Goal: Maintenance of adequate hydration will improve Outcome: Progressing   Problem: Nutritional: Goal: Nutritional status will improve Outcome: Progressing   Problem: Clinical Measurements: Goal: Will show no signs or symptoms of venous thromboembolism Outcome: Progressing Goal:  Will remain free from infection Outcome: Progressing Goal: Will show no signs of GI Leak Outcome: Progressing   Problem: Respiratory: Goal: Will regain and/or maintain adequate ventilation Outcome: Progressing   Problem: Pain Management: Goal: Pain level will decrease Outcome: Progressing   Problem: Skin Integrity: Goal: Demonstration of wound healing without infection will improve Outcome: Progressing   "

## 2024-02-23 NOTE — Progress Notes (Signed)
 PHARMACY CONSULT FOR:  Risk Assessment for Post-Discharge VTE Following Bariatric Surgery  Procedure* Laparoscopic Roux-en-Y gastric bypass   Sex M  Black race N  Age (years) 44  BMI (kg/m2) 52.69  Operation duration (minutes) 134  History of VTE requiring treatment* No  Hypercoagulable condition* Yes  Liver disorder* No   Pre-op venous stasis No  Pre-op functional health status Independent   Previous foregut or bariatric surg No  Post-op surgical site infection No  Transfusion intra- or post-op* No  Unplanned readmission No  Unplanned reoperation No  GI perforation/leak/obstruction* No   *specific risk factors for portomesenteric venous thrombosis   Other patient-specific factors to consider: Patient on PTA apixaban  for atrial fibrillation.   Recommendation for Discharge: Follow up plans to resume home apixaban  5mg  BID postoperatively prior to discharge.    Julian Washington is a 44 y.o. male who underwent Laparoscopic Roux-en-Y gastric bypass on 1/13   Case start: 0818 Case end: 1032   Allergies[1]  Patient Measurements: Height: 5' 9 (175.3 cm) Weight: (!) 161.8 kg (356 lb 12.8 oz) IBW/kg (Calculated) : 70.7 Body mass index is 52.69 kg/m.  Recent Labs    02/23/24 0810  HGB 12.9*  HCT 38.0*  CREATININE 0.90   Estimated Creatinine Clearance: 162 mL/min (by C-G formula based on SCr of 0.9 mg/dL).    Past Medical History:  Diagnosis Date   ADHD    Anemia    Anxiety and depression    h/o suicidality w/ citalopram   Atrial bigeminy    Atrial fibrillation (HCC)    RVR   Diabetes mellitus without complication (HCC)    Difficult intubation    ETD (eustachian tube dysfunction)    s/p ENT, declined ear tuves before    GERD (gastroesophageal reflux disease)    History of chicken pox    Titered on 08/16/2005   Hypertension    Hypogonadism male    Dr Trixie, used to see urology   Infertility male    Internal hemorrhoids    s/p banding   Low testosterone      Migraine    on topamax    OSA on CPAP    Palpitations    Palpitations    Pituitary mass    h/o increased prolactin, Dr Trixie (previously @ [facility])   Pneumonia 2012   PONV (postoperative nausea and vomiting)    Prediabetes    A1C 6.2 years ago   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 180 tablet 3 02/20/2024   Cholecalciferol (SM VITAMIN D3) 100 MCG (4000 UT) CAPS Take 4,000 Units by mouth daily.   02/15/2024   cyclobenzaprine  (FLEXERIL ) 10 MG tablet Take 1 tablet (10 mg total) by mouth at bedtime. (Patient taking differently: Take 10 mg by mouth at bedtime as needed for muscle spasms.) 90 tablet 3 Taking Differently   flecainide  (TAMBOCOR ) 100 MG tablet Take 1 tablet (100 mg total) by mouth every 12 (twelve) hours. 180 tablet 3 02/23/2024 at  4:30 AM   fluticasone  (FLONASE ) 50 MCG/ACT nasal spray Place 2 sprays into both nostrils daily. (Patient taking differently: Place 2 sprays into both nostrils at bedtime.) 16 g 6  02/22/2024 Bedtime   loratadine  (CLARITIN ) 10 MG tablet Take 10 mg by mouth daily.   Taking   losartan  (COZAAR ) 100 MG tablet Take 1 tablet (100 mg total) by mouth daily. 90 tablet 3 02/22/2024   Magnesium  Oxide (MAG-OXIDE PO) Take 400 mg by mouth daily.   02/20/2024   metFORMIN  (GLUCOPHAGE ) 1000 MG tablet Take 1 tablet (1,000 mg total) by mouth 2 (two) times daily with a meal. 180 tablet 1 02/22/2024   metoprolol  tartrate (LOPRESSOR ) 50 MG tablet Take 1 tablet (50 mg total) by mouth 2 (two) times daily. 180 tablet 1 02/23/2024 at  4:30 AM   Multiple Vitamin (MULTIVITAMIN WITH MINERALS) TABS tablet Take 1 tablet by mouth daily.   02/16/2024   mupirocin  ointment (BACTROBAN ) 2 % Apply liberally to affected area twice a  day as directed (Patient taking differently: Apply 1 Application topically 2 (two) times daily as needed (irritation).) 22 g 3 Taking Differently   omeprazole  (PRILOSEC) 40 MG capsule Take 1 capsule (40 mg total) by mouth daily before breakfast. (Patient taking differently: Take 40 mg by mouth at bedtime.) 90 capsule 1 02/22/2024 Bedtime   rosuvastatin  (CRESTOR ) 10 MG tablet Take 1 tablet (10 mg total) by mouth at bedtime. 90 tablet 1 02/22/2024 Bedtime   thiamine (VITAMIN B-1) 100 MG tablet Take 100 mg by mouth daily.   02/20/2024   tirzepatide  (MOUNJARO ) 12.5 MG/0.5ML Pen Inject 12.5 mg into the skin once a week. 6 mL 0 02/15/2024   augmented betamethasone  dipropionate (DIPROLENE -AF) 0.05 % cream Apply a small amount to affected area twice a day (Patient not taking: Reported on 02/17/2024) 50 g 1 Not Taking   azelastine  (ASTELIN ) 0.1 % nasal spray Place 2 sprays into both nostrils 2 (two) times daily. Use in each nostril as directed (Patient not taking: Reported on 02/17/2024) 30 mL 12 Not Taking   risankizumab -rzaa (SKYRIZI  PEN) 150 MG/ML pen Inject 1 pen injector subcutaneously every three months 1 mL 3 More than a month   triamcinolone  cream (KENALOG ) 0.1 % Apply a small amount to skin to on lower legs twice a day for 2-3 weeks at a time. (Patient taking differently: Apply 1 Application topically 2 (two) times daily as needed (itching).) 454 g 1 More than a month       Julian Washington 02/23/2024,8:38 AM      [1]  Allergies Allergen Reactions   Influenza Virus Vaccine Anaphylaxis   Levaquin [Levofloxacin In D5w] Swelling   Celexa [Citalopram] Other (See Comments)    Mental status changes    Dextrans Other (See Comments)    Makes the pt. Drowsy.    Hydrocodone-Acetaminophen      REACTION: itching   Latex    Oxycodone Itching   Shiitake Mushroom (Obsolete) Rash

## 2024-02-23 NOTE — Op Note (Signed)
 Julian Washington [id number] 1980-11-18. 02/23/2024  Preoperative diagnosis:  Severe obesity (BMI 53)  Paroxysmal A-fib (CMS/HHS-HCC) - on eliquis  (being held) OSA on CPAP Primary hypertension Chronic venous insufficiency of lower extremity Dyslipidemia Gastroesophageal reflux disease, unspecified whether esophagitis present Type 2 diabetes mellitus without complication, without long-term current use of insulin  (CMS/HHS-HCC) Hepatic steatosis   Postoperative  diagnosis:  1. same  Surgical procedure: Laparoscopic Roux-en-Y gastric bypass (ante-colic, ante-gastric); upper endoscopy  Surgeon: Camellia CHRISTELLA Blush, M.D. FACS  Asst.: Deward Fail MD FACS  Anesthesia: General plus bupivacaine   Complications: None   EBL: Minimal   Drains: None   Disposition: PACU in good condition   Indications for procedure: 44 y.o. yo male with morbid obesity who has been unsuccessful at sustained weight loss. The patient's comorbidities are listed above. We discussed the risk and benefits of surgery including but not limited to anesthesia risk, bleeding, infection, blood clot formation, anastomotic leak, anastomotic stricture, ulcer formation, death, respiratory complications, intestinal blockage, internal hernia, gallstone formation, vitamin and nutritional deficiencies, injury to surrounding structures, failure to lose weight and mood changes.   Description of procedure: Patient is brought to the operating room and general anesthesia induced. The patient had received preoperative broad-spectrum IV antibiotics and subcutaneous heparin . The abdomen was widely sterilely prepped with Chloraprep and draped. Patient timeout was performed and correct patient and procedure confirmed. Access was obtained with a 5 mm Optiview trocar in the left upper quadrant and pneumoperitoneum established without difficulty. Under direct vision 12 mm trocars were placed laterally in the right upper quadrant, right upper quadrant  midclavicular line, and to the left and above the umbilicus for the camera port. A 5 mm trocar was placed laterally in the left upper quadrant.  Local was infiltrated in bilateral lateral abdominal walls as a TAP block for postoperative pain relief.  The omentum (of which there was not any significant amount )was brought into the upper abdomen and the transverse mesocolon elevated and the ligament of Treitz clearly identified. A 65 cm biliopancreatic limb was then carefully measured from the ligament of Treitz. The small intestine was divided at this point with a single firing of the white load linear stapler. A Penrose drain was sutured to the end of the Roux-en-Y limb for later identification. A 100 cm Roux-en-Y limb was then carefully measured. At this point a side-to-side anastomosis was created between the Roux limb and the end of the biliopancreatic limb. This was accomplished with a single firing of the 60 mm white load linear stapler. The common enterotomy was closed with a running 2-0 Vicryl begun at either end of the enterotomy and tied centrally. Vistaseal  tissue sealant was placed over the anastomosis. The mesenteric defect was then closed with running 2-0 silk. The omentum was not divided (there really was not any significant amount omentum ) The patient was then placed in steep reversed Trendelenburg. Through a 5 mm subxiphoid site the [facility] retractor was placed and the left lobe of the liver elevated with excellent exposure of the upper stomach and hiatus. The angle of Hiss was then mobilized with the harmonic scalpel. A 6cm gastric pouch was then carefully measured along the lesser curve of the stomach. Dissection was carried along the lesser curve at this point with the Harmonic scalpel working carefully back toward the lesser sac at right angles to the lesser curve. The free lesser sac was then entered. After being sure all tubes were removed from the stomach an initial firing of the gold  load  60 mm linear stapler was fired at right angles across the lesser curve for about 4 cm. The gastric pouch was further mobilized posteriorly and then the pouch was completed with 4 further firings of the 60 mm blue load linear stapler up through the previously dissected angle of His.  He also had some adhesions between the posterior stomach and the intra-abdominal fat that we had to take down with harmonic scalpel.  Is unclear whether or not this was due to perhaps some prior pancreatitis.  There was a little bit of bleeding from 1 location that we achieved hemostasis with the harmonic as well as putting 2 clips on an obvious small blood vessel.  We did have to exchange the left upper quadrant mid abdominal 5 mm trocar for a 12 mm trocar in order to staple the last remaining fires of the stapler and creating the gastric pouch just due to how his stomach was positioned it was ensured that the pouch was completely mobilized away from the gastric remnant. This created a nice tubular 5-6 cm gastric pouch. The Roux limb was then brought up in an antecolic fashion with the candycane facing to the patient's left without undue tension. The gastrojejunostomy was created with an initial posterior row of 2-0 Vicryl between the Roux limb and the staple line of the gastric pouch. Enterotomies were then made in the gastric pouch and the Roux limb with the harmonic scalpel and at approximately 2-2-1/2 cm anastomosis was created with a single firing of the 60mm blue load linear stapler. The staple line was inspected and was intact without bleeding. The common enterotomy was then closed with running 2-0 Vicryl begun at either end and tied centrally. The Ewall tube was then easily passed through the anastomosis and an outer anterior layer of running 2-0 Vicryl was placed. The Ewald tube was removed. With the outlet of the gastrojejunostomy clamped and under saline irrigation the assistant performed upper endoscopy and with the gastric  pouch tensely distended with air-there was no evidence of leak on this test. The pouch was desufflated. The Andra defect was closed with running 2-0 silk. The abdomen was inspected for any evidence of bleeding or bowel injury and everything looked fine. The Nathanson retractor was removed under direct vision after coating the anastomosis with Vistaseal  tissue sealant. All CO2 was evacuated and trochars removed. Skin incisions were closed with 4-0 monocryl in a subcuticular fashion followed by steri-strips and bandages. Sponge needle and instrument counts were correct. The patient was taken to the PACU in good condition.    Camellia HERO. Tanda, MD, FACS General, Bariatric, & Minimally Invasive Surgery [facility],  [facility]

## 2024-02-23 NOTE — Progress Notes (Signed)

## 2024-02-23 NOTE — Anesthesia Postprocedure Evaluation (Signed)
"   Anesthesia Post Note  Patient: Julian Washington  Procedure(s) Performed: CREATION, GASTRIC BYPASS, LAPAROSCOPIC WITH ROUX-EN-Y GASTROENTEROSTOMY (Abdomen) ENDOSCOPY, UPPER GI TRACT     Patient location during evaluation: PACU Anesthesia Type: General Level of consciousness: awake and alert Pain management: pain level controlled Vital Signs Assessment: post-procedure vital signs reviewed and stable Respiratory status: spontaneous breathing, nonlabored ventilation and respiratory function stable Cardiovascular status: blood pressure returned to baseline and stable Postop Assessment: no apparent nausea or vomiting Anesthetic complications: no   No notable events documented.  Last Vitals:  Vitals:   02/23/24 1200 02/23/24 1225  BP: (!) 143/80 138/69  Pulse: 63 68  Resp: 12   Temp:  36.6 C  SpO2: 99% 97%    Last Pain:  Vitals:   02/23/24 1225  TempSrc: Oral  PainSc: Asleep                 Taelynn Mcelhannon,W. EDMOND      "

## 2024-02-24 ENCOUNTER — Other Ambulatory Visit (HOSPITAL_COMMUNITY): Payer: Self-pay

## 2024-02-24 ENCOUNTER — Other Ambulatory Visit: Payer: Self-pay

## 2024-02-24 ENCOUNTER — Other Ambulatory Visit (HOSPITAL_BASED_OUTPATIENT_CLINIC_OR_DEPARTMENT_OTHER): Payer: Self-pay

## 2024-02-24 ENCOUNTER — Encounter (HOSPITAL_COMMUNITY): Payer: Self-pay | Admitting: General Surgery

## 2024-02-24 LAB — CBC WITH DIFFERENTIAL/PLATELET
Abs Immature Granulocytes: 0.06 K/uL (ref 0.00–0.07)
Basophils Absolute: 0 K/uL (ref 0.0–0.1)
Basophils Relative: 0 %
Eosinophils Absolute: 0 K/uL (ref 0.0–0.5)
Eosinophils Relative: 0 %
HCT: 36.2 % — ABNORMAL LOW (ref 39.0–52.0)
Hemoglobin: 12.1 g/dL — ABNORMAL LOW (ref 13.0–17.0)
Immature Granulocytes: 1 %
Lymphocytes Relative: 20 %
Lymphs Abs: 1.7 K/uL (ref 0.7–4.0)
MCH: 27 pg (ref 26.0–34.0)
MCHC: 33.4 g/dL (ref 30.0–36.0)
MCV: 80.8 fL (ref 80.0–100.0)
Monocytes Absolute: 0.5 K/uL (ref 0.1–1.0)
Monocytes Relative: 6 %
Neutro Abs: 6.3 K/uL (ref 1.7–7.7)
Neutrophils Relative %: 73 %
Platelets: 151 K/uL (ref 150–400)
RBC: 4.48 MIL/uL (ref 4.22–5.81)
RDW: 13.3 % (ref 11.5–15.5)
Smear Review: NORMAL
WBC: 8.6 K/uL (ref 4.0–10.5)
nRBC: 0 % (ref 0.0–0.2)

## 2024-02-24 LAB — COMPREHENSIVE METABOLIC PANEL WITH GFR
ALT: 57 U/L — ABNORMAL HIGH (ref 0–44)
AST: 66 U/L — ABNORMAL HIGH (ref 15–41)
Albumin: 3.8 g/dL (ref 3.5–5.0)
Alkaline Phosphatase: 61 U/L (ref 38–126)
Anion gap: 12 (ref 5–15)
BUN: 9 mg/dL (ref 6–20)
CO2: 21 mmol/L — ABNORMAL LOW (ref 22–32)
Calcium: 8.8 mg/dL — ABNORMAL LOW (ref 8.9–10.3)
Chloride: 103 mmol/L (ref 98–111)
Creatinine, Ser: 0.77 mg/dL (ref 0.61–1.24)
GFR, Estimated: >60 mL/min
Glucose, Bld: 118 mg/dL — ABNORMAL HIGH (ref 70–99)
Potassium: 4.5 mmol/L (ref 3.5–5.1)
Sodium: 135 mmol/L (ref 135–145)
Total Bilirubin: 0.3 mg/dL (ref 0.0–1.2)
Total Protein: 6.4 g/dL — ABNORMAL LOW (ref 6.5–8.1)

## 2024-02-24 MED ORDER — ACETAMINOPHEN 500 MG PO TABS: 1000.0000 mg | ORAL_TABLET | Freq: Three times a day (TID) | ORAL | Status: AC

## 2024-02-24 MED ORDER — GABAPENTIN 100 MG PO CAPS
100.0000 mg | ORAL_CAPSULE | Freq: Two times a day (BID) | ORAL | 0 refills | Status: DC
  Filled 2024-02-24: qty 10, 5d supply, fill #0

## 2024-02-24 MED ORDER — ONDANSETRON 4 MG PO TBDP
4.0000 mg | ORAL_TABLET | Freq: Four times a day (QID) | ORAL | 0 refills | Status: AC | PRN
  Filled 2024-02-24: qty 20, 5d supply, fill #0

## 2024-02-24 MED ORDER — TRAMADOL HCL 50 MG PO TABS
50.0000 mg | ORAL_TABLET | Freq: Four times a day (QID) | ORAL | 0 refills | Status: DC | PRN
  Filled 2024-02-24: qty 5, 2d supply, fill #0

## 2024-02-24 NOTE — Progress Notes (Signed)
Patient alert and oriented, Post op day 1.  Provided support and encouragement.  Encouraged pulmonary toilet, ambulation and small sips of liquids.  All questions answered.  Will continue to monitor. 

## 2024-02-24 NOTE — Progress Notes (Signed)
 Reviewed written d/c instructions w pt, all questions answered and he verbalized understanding. D/C via w/c w all belongings in stable condition.

## 2024-02-24 NOTE — Progress Notes (Signed)
" °   02/24/24 0043  BiPAP/CPAP/SIPAP  $ Non-Invasive Home Ventilator  Initial  BiPAP/CPAP/SIPAP Pt Type Adult  BiPAP/CPAP/SIPAP DREAMSTATIOND  Mask Type Nasal mask  Patient Home Machine Yes  Safety Check Completed by RT for Home Unit Yes, no issues noted  Patient Home Mask Yes  Patient Home Tubing Yes  Auto Titrate No (home settings)  Device Plugged into RED Power Outlet Yes    "

## 2024-02-24 NOTE — Progress Notes (Signed)
 Patient alert and oriented, pain is controlled. Patient is tolerating fluids, advanced to protein shake today, patient is tolerating well. Reviewed Gastric sleeve/bypass discharge instructions with patient and patient is able to articulate understanding. Provided information on BELT program, Support Group, BSTOP-D, and [facility] outpatient pharmacy. Communicated general update of patient status to surgeon. All questions answered. 24hr fluid recall is 480 mL per hydration protocol, bariatric nurse coordinator to make follow-up phone call within one week.    Thank you,  Lubertha Basque, RN, MSN Bariatric Nurse Coordinator [phone] (office)

## 2024-02-24 NOTE — Progress Notes (Signed)
" °   02/24/24 1046  TOC Brief Assessment  Insurance and Status Reviewed  Patient has primary care physician Yes  Home environment has been reviewed home with spouse  Prior level of function: independent  Prior/Current Home Services No current home services  Social Drivers of Health Review SDOH reviewed no interventions necessary  Readmission risk has been reviewed Yes  Transition of care needs no transition of care needs at this time    "

## 2024-02-24 NOTE — Progress Notes (Signed)
 Discharge meds in a secure bag delivered to patient by this RN

## 2024-02-24 NOTE — Plan of Care (Signed)
 " Problem: Education: Goal: Knowledge of General Education information will improve Description: Including pain rating scale, medication(s)/side effects and non-pharmacologic comfort measures Outcome: Adequate for Discharge   Problem: Health Behavior/Discharge Planning: Goal: Ability to manage health-related needs will improve Outcome: Adequate for Discharge   Problem: Clinical Measurements: Goal: Ability to maintain clinical measurements within normal limits will improve Outcome: Adequate for Discharge Goal: Will remain free from infection Outcome: Adequate for Discharge Goal: Diagnostic test results will improve Outcome: Adequate for Discharge Goal: Respiratory complications will improve Outcome: Adequate for Discharge Goal: Cardiovascular complication will be avoided Outcome: Adequate for Discharge   Problem: Activity: Goal: Risk for activity intolerance will decrease Outcome: Adequate for Discharge   Problem: Nutrition: Goal: Adequate nutrition will be maintained Outcome: Adequate for Discharge   Problem: Coping: Goal: Level of anxiety will decrease Outcome: Adequate for Discharge   Problem: Elimination: Goal: Will not experience complications related to bowel motility Outcome: Adequate for Discharge Goal: Will not experience complications related to urinary retention Outcome: Adequate for Discharge   Problem: Pain Managment: Goal: General experience of comfort will improve and/or be controlled Outcome: Adequate for Discharge   Problem: Safety: Goal: Ability to remain free from injury will improve Outcome: Adequate for Discharge   Problem: Skin Integrity: Goal: Risk for impaired skin integrity will decrease Outcome: Adequate for Discharge   Problem: Education: Goal: Knowledge of the prescribed therapeutic regimen will improve Outcome: Adequate for Discharge   Problem: Bowel/Gastric: Goal: Gastrointestinal status for postoperative course will  improve Outcome: Adequate for Discharge   Problem: Cardiac: Goal: Ability to maintain an adequate cardiac output Outcome: Adequate for Discharge Goal: Will show no evidence of cardiac arrhythmias Outcome: Adequate for Discharge   Problem: Nutritional: Goal: Will attain and maintain optimal nutritional status Outcome: Adequate for Discharge   Problem: Neurological: Goal: Will regain or maintain usual level of consciousness Outcome: Adequate for Discharge   Problem: Clinical Measurements: Goal: Ability to maintain clinical measurements within normal limits Outcome: Adequate for Discharge Goal: Postoperative complications will be avoided or minimized Outcome: Adequate for Discharge   Problem: Respiratory: Goal: Will regain and/or maintain adequate ventilation Outcome: Adequate for Discharge Goal: Respiratory status will improve Outcome: Adequate for Discharge   Problem: Skin Integrity: Goal: Demonstrates signs of wound healing without infection Outcome: Adequate for Discharge   Problem: Urinary Elimination: Goal: Will remain free from infection Outcome: Adequate for Discharge Goal: Ability to achieve and maintain adequate urine output Outcome: Adequate for Discharge   Problem: Education: Goal: Ability to state signs and symptoms to report to health care provider will improve Outcome: Adequate for Discharge Goal: Knowledge of the prescribed self-care regimen will improve Outcome: Adequate for Discharge Goal: Knowledge of discharge needs will improve Outcome: Adequate for Discharge   Problem: Activity: Goal: Ability to tolerate increased activity will improve Outcome: Adequate for Discharge   Problem: Bowel/Gastric: Goal: Gastrointestinal status for postoperative course will improve Outcome: Adequate for Discharge Goal: Occurrences of nausea will decrease Outcome: Adequate for Discharge   Problem: Coping: Goal: Development of coping mechanisms to deal with changes  in body function or appearance will improve Outcome: Adequate for Discharge   Problem: Fluid Volume: Goal: Maintenance of adequate hydration will improve Outcome: Adequate for Discharge   Problem: Nutritional: Goal: Nutritional status will improve Outcome: Adequate for Discharge   Problem: Clinical Measurements: Goal: Will show no signs or symptoms of venous thromboembolism Outcome: Adequate for Discharge Goal: Will remain free from infection Outcome: Adequate for Discharge Goal: Will show no signs  of GI Leak Outcome: Adequate for Discharge   Problem: Respiratory: Goal: Will regain and/or maintain adequate ventilation Outcome: Adequate for Discharge   Problem: Pain Management: Goal: Pain level will decrease Outcome: Adequate for Discharge   Problem: Skin Integrity: Goal: Demonstration of wound healing without infection will improve Outcome: Adequate for Discharge   "

## 2024-02-24 NOTE — Discharge Summary (Signed)
 Physician Discharge Summary  Julian Washington FMW:[id number] DOB: [birth year] DOA: 02/23/2024  PCP: Amon Aloysius BRAVO, MD  Admit date: 02/23/2024 Discharge date: 02/24/2024  Recommendations for Outpatient Follow-up:     Follow-up Information     [name], MD Follow up on 03/17/2024.   Specialty: General Surgery Why: Please arrive 15 minutes prior to your appointment at 9:30am Contact information: [address] [place] [address] [phone]         [name], [place] Follow up on 04/20/2024.   Specialty: General Surgery Why: Please arrive 15 minutes prior to your appointment at 2:30p with P. Gosai on behalf of Dr. Tanda Pass information: [address] [facility] [place] [facility] [place] [address] [phone]                Discharge Diagnoses:  Principal Problem:   S/P gastric bypass Severe obesity (BMI 53)  Paroxysmal A-fib (CMS/HHS-HCC) - on eliquis  (being held) OSA on CPAP Primary hypertension Chronic venous insufficiency of lower extremity Dyslipidemia Gastroesophageal reflux disease, unspecified whether esophagitis present Type 2 diabetes mellitus without complication, without long-term current use of insulin  (CMS/HHS-HCC) Hepatic steatosis     Surgical Procedure: Laparoscopic Roux-en-Y gastric bypass, upper endoscopy  Discharge Condition: Good Disposition: Home  Diet recommendation: Postoperative gastric bypass diet  Filed Weights   02/23/24 0625  Weight: (!) 161.8 kg     Hospital Course:  The patient was admitted for a planned laparoscopic Roux-en-Y gastric bypass. Please see operative note. Preoperatively the patient was given 5000 units of subcutaneous heparin  for DVT prophylaxis. ERAS protocol was used. Postoperative prophylactic heparin  dosing was started on the evening of postoperative day 0.  The patient was started on ice chips and water  on the evening of POD 0 which they tolerated. On postoperative  day 1 The patient's diet was advanced to protein shakes which they also tolerated. On POD 1, The patient was ambulating without difficulty. Their vital signs are stable without fever or tachycardia. Their hemoglobin had remained stable. The patient was maintained on their home settings for CPAP therapy. The patient had received discharge instructions and counseling. They were deemed stable for discharge.  He will resume his Eliquis  on postop day 2 in the morning.  This will also serve as his extended prophylaxis for DVT prevention  BP 126/74 (BP Location: Right Arm)   Pulse 60   Temp 98.3 F (36.8 C) (Oral)   Resp 18   Ht 5' 9 (1.753 m)   Wt (!) 161.8 kg   SpO2 100%   BMI 52.69 kg/m   Gen: alert, NAD, non-toxic appearing Pupils: equal, no scleral icterus Pulm: Lungs clear to auscultation, symmetric chest rise CV: regular rate and rhythm Abd: soft, min tender, nondistended. No cellulitis. No incisional hernia Ext: no edema, no calf tenderness Skin: no rash, no jaundice  Discharge Instructions  Discharge Instructions     Ambulate hourly while awake   Complete by: As directed    Call MD for:  difficulty breathing, headache or visual disturbances   Complete by: As directed    Call MD for:  persistant dizziness or light-headedness   Complete by: As directed    Call MD for:  persistant nausea and vomiting   Complete by: As directed    Call MD for:  redness, tenderness, or signs of infection (pain, swelling, redness, odor or green/yellow discharge around incision site)   Complete by: As directed    Call MD for:  severe uncontrolled pain   Complete by: As directed    Call MD for:  temperature >101 F   Complete by: As directed    Diet bariatric full liquid   Complete by: As directed    Discharge instructions   Complete by: As directed    See bariatric discharge instructions   Incentive spirometry   Complete by: As directed    Perform hourly while awake      Allergies as of  02/24/2024       Reactions   Influenza Virus Vaccine Anaphylaxis   Levaquin [levofloxacin In D5w] Swelling   Celexa [citalopram] Other (See Comments)   Mental status changes    Dextrans Other (See Comments)   Makes the pt. Drowsy.    Hydrocodone-acetaminophen     REACTION: itching   Latex    Oxycodone Itching   Shiitake Mushroom (obsolete) Rash        Medication List     PAUSE taking these medications    Eliquis  5 MG Tabs tablet Wait to take this until: February 25, 2024 Morning Generic drug: apixaban  Take 1 tablet (5 mg total) by mouth 2 (two) times daily.       STOP taking these medications    augmented betamethasone  dipropionate 0.05 % cream Commonly known as: DIPROLENE -AF   Azelastine  HCl 137 MCG/SPRAY Soln   metFORMIN  1000 MG tablet Commonly known as: GLUCOPHAGE    Mounjaro  12.5 MG/0.5ML Pen Generic drug: tirzepatide        TAKE these medications    acetaminophen  500 MG tablet Commonly known as: TYLENOL  Take 2 tablets (1,000 mg total) by mouth every 8 (eight) hours for 5 days.   cyclobenzaprine  10 MG tablet Commonly known as: FLEXERIL  Take 1 tablet (10 mg total) by mouth at bedtime. What changed:  when to take this reasons to take this   flecainide  100 MG tablet Commonly known as: TAMBOCOR  Take 1 tablet (100 mg total) by mouth every 12 (twelve) hours.   fluticasone  50 MCG/ACT nasal spray Commonly known as: FLONASE  Place 2 sprays into both nostrils daily. What changed: when to take this   gabapentin  100 MG capsule Commonly known as: NEURONTIN  Take 1 capsule (100 mg total) by mouth every 12 (twelve) hours.   loratadine  10 MG tablet Commonly known as: CLARITIN  Take 10 mg by mouth daily.   losartan  100 MG tablet Commonly known as: COZAAR  Take 1 tablet (100 mg total) by mouth daily.   MAG-OXIDE PO Take 400 mg by mouth daily.   metoprolol  tartrate 50 MG tablet Commonly known as: LOPRESSOR  Take 1 tablet (50 mg total) by mouth 2 (two)  times daily.   multivitamin with minerals Tabs tablet Take 1 tablet by mouth daily.   mupirocin  ointment 2 % Commonly known as: BACTROBAN  Apply liberally to affected area twice a day as directed What changed:  when to take this reasons to take this   omeprazole  40 MG capsule Commonly known as: PRILOSEC Take 1 capsule (40 mg total) by mouth daily before breakfast. What changed: when to take this   ondansetron  4 MG disintegrating tablet Commonly known as: ZOFRAN -ODT Take 1 tablet (4 mg total) by mouth every 6 (six) hours as needed for nausea or vomiting.   rosuvastatin  10 MG tablet Commonly known as: CRESTOR  Take 1 tablet (10 mg total) by mouth at bedtime.   Skyrizi  Pen 150 MG/ML pen Generic drug: risankizumab -rzaa Inject 1 pen injector subcutaneously every three months   SM Vitamin D3 100 MCG (4000 UT) Caps  Generic drug: Cholecalciferol Take 4,000 Units by mouth daily.   thiamine 100 MG tablet Commonly known as: Vitamin B-1 Take 100 mg by mouth daily.   traMADol  50 MG tablet Commonly known as: ULTRAM  Take 1 tablet (50 mg total) by mouth every 6 (six) hours as needed for moderate pain (pain score 4-6) or severe pain (pain score 7-10).   triamcinolone  cream 0.1 % Commonly known as: KENALOG  Apply a small amount to skin to on lower legs twice a day for 2-3 weeks at a time. What changed:  when to take this reasons to take this        Follow-up Information     [name], MD Follow up on 03/17/2024.   Specialty: General Surgery Why: Please arrive 15 minutes prior to your appointment at 9:30am Contact information: [address] [place] [address] [phone]         [name], [place] Follow up on 04/20/2024.   Specialty: General Surgery Why: Please arrive 15 minutes prior to your appointment at 2:30p with MYRTIS Barban on behalf of Dr. Tanda Pass information: [address] [facility] [place] [facility] [place]  [address] [phone]                  The results of significant diagnostics from this hospitalization (including imaging, microbiology, ancillary and laboratory) are listed below for reference.    Significant Diagnostic Studies: No results found.  Labs: Basic Metabolic Panel: Recent Labs  Lab 02/23/24 0810 02/23/24 1105 02/24/24 0505  NA 140 139 135  K 4.6 5.8* 4.5  CL 103 106 103  CO2  --  19* 21*  GLUCOSE 95 150* 118*  BUN 13 14 9   CREATININE 0.90 1.01 0.77  CALCIUM   --  9.3 8.8*   Liver Function Tests: Recent Labs  Lab 02/24/24 0505  AST 66*  ALT 57*  ALKPHOS 61  BILITOT 0.3  PROT 6.4*  ALBUMIN 3.8    CBC: Recent Labs  Lab 02/18/24 0831 02/23/24 0810 02/23/24 1412 02/24/24 0505  WBC 7.4  --   --  8.6  NEUTROABS  --   --   --  6.3  HGB 13.3 12.9* 13.7 12.1*  HCT 40.5 38.0* 40.9 36.2*  MCV 81.8  --   --  80.8  PLT 198  --   --  151    CBG: Recent Labs  Lab 02/18/24 0816 02/23/24 0605 02/23/24 1042  GLUCAP 103* 77 153*    Principal Problem:   S/P gastric bypass   Time coordinating discharge: 20 min  Signed:  Locus CHRISTELLA Tanda, MD [facility] [phone] 02/24/2024, 5:04 PM

## 2024-02-25 ENCOUNTER — Other Ambulatory Visit: Payer: Self-pay

## 2024-02-25 ENCOUNTER — Other Ambulatory Visit (HOSPITAL_COMMUNITY): Payer: Self-pay

## 2024-02-25 MED ORDER — PANTOPRAZOLE SODIUM 40 MG PO TBEC
40.0000 mg | DELAYED_RELEASE_TABLET | Freq: Every day | ORAL | 1 refills | Status: AC
  Filled 2024-02-25: qty 30, 30d supply, fill #0

## 2024-02-26 ENCOUNTER — Other Ambulatory Visit (HOSPITAL_COMMUNITY): Payer: Self-pay

## 2024-02-26 ENCOUNTER — Telehealth (HOSPITAL_COMMUNITY): Payer: Self-pay | Admitting: *Deleted

## 2024-02-26 NOTE — Telephone Encounter (Signed)
 1. Tell me about your pain and pain management?     Pt denies any pain.   Pt states that s/he has been experiencing some intermittent indigestion while drinking that lasts for a a while but got relief with simethicone .  Pt can tolerate protein shakes and water . Pt instructed to call [facility] if pain worsens.  Pt had c/o chills noted at night , asked patient if he notices it again to please check his blood sugar and record it. If it is outside of the normal range 70-120, get some more protein shake in if it is low  and recheck. Pt is to contact the surgeons office and his PCP.      2. Let's talk about fluid intake. How much total fluid are you taking in?   Pt states that s/he is getting in at least 54oz of fluid including protein shakes, bottled water , and broth   3. How much protein have you taken in the last day?   Pt states he is close to meeting his goal of 80g of protein each day with the protein shakes and powder    4. Have you had nausea? Tell me about when you have experienced nausea and what you did to help?   Pt reported nausea and took zofran , along with simethicone  pt reported relief  5. Has the frequency or color changed with your urine?   Pt did not report changes in frequency or urgency.   6. Tell me what your incisions look like?   Incisions look fine. Pt denies a fever.Pt states incisions are not swollen, open, or draining. Pt encouraged to call [facility] if incisions change.    7. Have you been passing gas? BM?   Pt states that they have not had a BM yet but they are passing gas  Pt instructed to take either Miralax or MoM as instructed per Gastric Bypass/Sleeve Discharge Home Care Instructions. Pt to call surgeon's office if not able to have BM with medication.    8. If a problem or question were to arise who would you call? Do you know contact numbers for [facility], [facility], and [facility]?   Pt knows to call [facility] for surgical, [facility] for nutrition, and [facility] for non-urgent questions  or concerns. Pt denies dehydration symptoms. Pt can describe s/sx of dehydration.   9. How has the walking going?   Pt states he is walking around and able to be active without difficulty.   10. Are you still using your incentive spirometer? If so, how often?   Pt states that he is doing the I.S. Pt encouraged to use incentive spirometer, at least 10x every hour while awake until s/he sees the surgeon.   11. How are your vitamins and calcium  going? How are you taking them?    Pt states that he is taking his/her supplements and vitamins without difficulty.    Patient got 72 g of protein and 52oz of fluid.

## 2024-03-09 ENCOUNTER — Encounter: Admitting: Dietician

## 2024-03-09 ENCOUNTER — Telehealth: Payer: Self-pay | Admitting: Pulmonary Disease

## 2024-03-09 ENCOUNTER — Encounter: Payer: Self-pay | Admitting: Dietician

## 2024-03-09 VITALS — Ht 69.5 in | Wt 335.2 lb

## 2024-03-09 DIAGNOSIS — E119 Type 2 diabetes mellitus without complications: Secondary | ICD-10-CM | POA: Diagnosis present

## 2024-03-09 DIAGNOSIS — E669 Obesity, unspecified: Secondary | ICD-10-CM | POA: Diagnosis present

## 2024-03-09 NOTE — Progress Notes (Signed)
 2 Week Post-Operative Nutrition Class   Class start Time: 1520   Class End Time: 1625  This was a class of 2 patients.   Patient was seen on 03/09/2024 for Post-Operative Nutrition education at the [facility].    Surgery date: 02/23/2024 Surgery type: Gastric By-Pass  Anthropometrics  Start weight at [facility]: 385.7 lbs (date: 04/21/2023)  Height: 69.5 in Weight today: 335.2 lbs  Clinical  Medical hx: HTN, hypercholesterolemia, obesity, A-fib RVR, sleep apnea, T2DM Medications: Mounjaro , rosuvastatin , skyrizi , omeprazole , metformin , magnesium  oxide, losartan , flonase , flecainide , flexeril  (as needed), eliquis , metopranole, adding Vit D Labs:  Notable signs/symptoms: none noted Any previous deficiencies? No Bowel Habits: Every day to every other day no complaints   Body Composition Scale 03/09/2024  Current Body Weight 335.2  Total Body Fat % 39.7  Visceral Fat 35  Fat-Free Mass % 60.2   Total Body Water  % 41.2  Muscle-Mass lbs 63.2  BMI 48.7  Body Fat Displacement          Torso  lbs 82.8         Left Leg  lbs 16.5         Right Leg  lbs 16.5         Left Arm  lbs 8.2         Right Arm  lbs 8.2    The following the learning objectives were met by the patient during this course: Identifies Soft Prepped Plan Advancement Guide  Identifies Soft, High Proteins (Phase 1), beginning 2 weeks post-operatively to 3 weeks post-operatively Identifies Additional Soft High Proteins, soft non-starchy vegetables, fruits and starches (Phase 2), beginning 3 weeks post-operatively to 3 months post-operatively Identifies appropriate sources of fluids, proteins, vegetables, fruits and starches Identifies appropriate fat sources and healthy verses unhealthy fat types   States protein, vegetable, fruit and starch recommendations and appropriate sources post-operatively Identifies the need for appropriate texture modifications, mastication, and bite sizes when  consuming solids Identifies appropriate fat consumption and sources Identifies appropriate multivitamin and calcium  sources post-operatively Describes the need for physical activity post-operatively and will follow MD recommendations States when to call healthcare provider regarding medication questions or post-operative complications   Handouts given during class include: Soft Prepped Plan Advancement Guide   Follow-Up Plan: Patient will follow-up at [facility] in 10 weeks for 3 month post-op nutrition visit for diet advancement per MD.

## 2024-03-09 NOTE — Telephone Encounter (Signed)
 STAT if HR is under 50 or over 120  (normal HR is 60-100 beats per minute)  What is your heart rate? Not sure   Do you have a log of your heart rate readings (document readings)?  40's-70's since he has had surgery 02/23/24  Do you have any other symptoms? No

## 2024-03-09 NOTE — Telephone Encounter (Signed)
 Spoke with the patient who states that he had gastric bypass surgery on 1/12. He has been monitoring his vitals and has noticed his heart rate in the 40s several times. Overall rates have been in the 60s-70s. He states that when it was in the 40s he did feel tired. Denies any lightheadedness or dizziness. He has held his metoprolol  when heart rates were in the 40s/50s. He has been using a pulse oximeter but has also felt his pulse manually and gotten the same readings. He denies feeling any skipped beats or irregular rhythms. He states blood pressure has been good in the 130/70s. He would like to try cutting back on his metoprolol  to 25 mg twice daily to see if this helps. He is also taking flecainide  100 mg twice daily which he will continue. He will call back if he continues to have low readings and/or becomes symptomatic.

## 2024-03-12 ENCOUNTER — Telehealth: Payer: Self-pay | Admitting: Dietician

## 2024-03-12 NOTE — Telephone Encounter (Signed)
 Returned patient's call with questions about specific plant based proteins in the legumes family for the first phase of soft proteins, and help with feeling of satisfaction vs fullness. Dietitian confirmed that legumes are options, but to wait on higher protein grains, like quinoa. Pt states that satisfaction is coming quickly and he has not eaten that much. Pt's goal for protein is 80 grams per day. Dietitian advised that he spread protein out in 3 meals and 2-3 snacks equally, with protein adding up to 80 grams throughout the day. Dietitian advised that pt may need to still supplement protein with protein shakes and protein powders until volume of food increases. Pt expressed understanding and all patients questions were answered to patient's satisfaction.

## 2024-03-16 LAB — OPHTHALMOLOGY REPORT-SCANNED

## 2024-03-17 ENCOUNTER — Other Ambulatory Visit (HOSPITAL_COMMUNITY): Payer: Self-pay

## 2024-03-18 ENCOUNTER — Telehealth: Payer: Self-pay | Admitting: Pulmonary Disease

## 2024-03-18 ENCOUNTER — Telehealth: Payer: Self-pay | Admitting: Dietician

## 2024-03-18 NOTE — Telephone Encounter (Signed)
 Pt c/o medication issue:  1. Name of Medication:   losartan  (COZAAR ) 100 MG tablet  metoprolol  tartrate (LOPRESSOR ) 50 MG tablet   2. How are you currently taking this medication (dosage and times per day)?   As prescribed  3. Are you having a reaction (difficulty breathing--STAT)?   Dizziness, low HR, BP low  4. What is your medication issue?   Patient is concerned he is having symptoms while on these medications.

## 2024-03-18 NOTE — Telephone Encounter (Signed)
 RD called pt to verify fluid intake once starting soft, solid proteins 2 week post-bariatric surgery.   Daily Fluid intake: 48 oz Daily Protein intake: 75-80 grams Bowel Habits: every 3-4 days, stating his is going to use Miralax, and switch to lower iron.  Concerns/issues: pt states he found a few things that don't agree with him; no other issues; Pt states he is switching to calcium  citrate. Pt states he is going back to work on Monday.

## 2024-03-18 NOTE — Progress Notes (Unsigned)
 "   Cardiology Office Note Date:  03/18/2024  Patient ID:  Julian, Washington 03/16/1980, MRN [id number] PCP:  Amon Aloysius BRAVO, MD  Cardiologist:  Dr. Wonda Electrophysiologist: Dr. Cindie  Chief Complaint:    BP/HR concerns s/p bariatric sx  History of Present Illness: Julian Washington is a 44 y.o. male with history of  OSA on CPAP, hypogonadotropic hypogonadism, pituitary mass s/p adenoma removal 2014, DM, HTN, morbid obesity, Afib   Initially saw cardiology back I 2018 with palpitations, discontinuation of weight loss meds did not help, started on BB, monitoring noted PACs, rare PVC  Had  hiatus from the office until a hospitalization Oct 2022 for a syncopal event that occurred while walking to the bathroom, EMS found HR 200 found in Afib w/RVR. Saw Dr. Cindie, planned for a/c, TEE/DCCV, coronary CT and AAD pending this. Not an ablation candidate given morbid obesity Had spontaneous conversion CT noted no CAD, no calcium    Following with mostly EP service, myself and Dr. Cindie. Last w/Dr. Cindie on 01/20/24, some palpitations associated with bigeminal PACs on his wearable tech. Stable intervals, no changes were made. Discussed his pending relocation/departure from the service  02/23/24: Laparoscopic Roux-en-Y gastric bypass (ante-colic, ante-gastric); upper endoscopy   03/09/24: pt called with reports of fluctuating BPs and concerns of HR in the 40's-50's with some dizziness and had self held his metoprolol , given an appt to be seen In d/w EP RN team, planned 1/2 dose metoprolol  and continued flecainide    Called 03/18/24, with recurrent concerns of fluctuating HRs, BPs --------------  Discussed the use of AI scribe software for clinical note transcription with the patient, who gave verbal consent to proceed.  History of Present Illness   Julian Washington is a 44 year old male with a history of gastric bypass surgery and atrial bigeminy who presents with concerns about blood pressure and  heart rate management.  He underwent gastric bypass surgery on January 12th and has been monitoring his blood pressure and heart rate since then. He experiences episodes of dizziness and weakness, particularly when his heart rate drops into the 40s. He has been taking metoprolol , initially at 50 mg, but reduced to 25 mg due to low heart rate. He did not take his dose this morning as his heart rate was between 40 and 50 bpm. Missing two doses previously resulted in his heart rate increasing to the 100s.  His blood pressure has been fluctuating, with readings sometimes in the 120s to 130s, and the lowest being 109/73, which made him feel weak and dizzy. He experiences orthostatic symptoms but manages them by sitting down. He is trying to balance his medication to avoid these symptoms.  He consumes about 61 ounces of fluid daily, including protein shakes, and takes a multivitamin with iron, which affects the color of his urine. He uses a CPAP machine consistently, even during naps, and has noticed some leaks since losing weight. He reports no chest tightness or shortness of breath when lying flat.  He has not returned to work fully but plans to do so soon. 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 He has lost significant weight since his surgery, dropping from 355 pounds pre-surgery. He is originally from [place] and now resides in [place].  He has not been wearing compression stockings since his surgery but notes improvement in leg swelling. He mentions a side bulge that appears more prominent at times, but no thyroid  labs have  been done.    Afib/AAD hx Diagnosed Oct 2022 Flecainide  started Oct 2022   Past Medical History:  Diagnosis Date   ADHD    Anemia    Anxiety and depression    h/o suicidality w/ citalopram   Atrial bigeminy    Atrial fibrillation (HCC)    RVR   Diabetes mellitus without complication (HCC)    Difficult intubation    ETD  (eustachian tube dysfunction)    s/p ENT, declined ear tuves before    GERD (gastroesophageal reflux disease)    History of chicken pox    Titered on 08/16/2005   Hypertension    Hypogonadism male    Dr Trixie, used to see urology   Infertility male    Internal hemorrhoids    s/p banding   Low testosterone     Migraine    on topamax    OSA on CPAP    Palpitations    Palpitations    Pituitary mass    h/o increased prolactin, Dr Trixie (previously @ [facility])   Pneumonia 2012   PONV (postoperative nausea and vomiting)    Prediabetes    A1C 6.2 years ago   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   Shingles    Vitamin B 12 deficiency    h/o   Vitamin D  deficiency     Past Surgical History:  Procedure Laterality Date   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/04/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, ACTH    ROUX-EN-Y GASTRIC BYPASS  02/23/2024   TONSILLECTOMY AND ADENOIDECTOMY     UPPER GI ENDOSCOPY N/A 02/23/2024   Procedure: ENDOSCOPY, UPPER GI TRACT;  Surgeon: [name], MD;  Location: [facility];  Service: General;  Laterality: N/A;    Current Outpatient Medications  Medication Sig Dispense Refill   apixaban  (ELIQUIS ) 5 MG TABS tablet Take 1 tablet (5 mg total) by mouth 2 (two) times daily. 180 tablet 3   Cholecalciferol (SM VITAMIN D3) 100 MCG (4000 UT) CAPS Take 4,000 Units by mouth daily.     cyclobenzaprine  (FLEXERIL ) 10 MG tablet Take 1 tablet (10 mg total) by mouth at bedtime. (Patient taking differently: Take 10 mg by mouth at bedtime as needed for muscle spasms.) 90 tablet 3   flecainide  (TAMBOCOR ) 100 MG tablet Take 1 tablet (100 mg total) by mouth every 12 (twelve) hours. 180 tablet 3   fluticasone  (FLONASE ) 50 MCG/ACT nasal spray Place 2 sprays into both nostrils daily. (Patient taking differently: Place 2 sprays into both nostrils at bedtime.) 16 g 6   gabapentin  (NEURONTIN ) 100 MG capsule Take 1 capsule (100 mg total) by mouth every 12 (twelve) hours. 10 capsule 0   loratadine   (CLARITIN ) 10 MG tablet Take 10 mg by mouth daily.     losartan  (COZAAR ) 100 MG tablet Take 1 tablet (100 mg total) by mouth daily. 90 tablet 3   Magnesium  Oxide (MAG-OXIDE PO) Take 400 mg by mouth daily.     metoprolol  tartrate (LOPRESSOR ) 50 MG tablet Take 1 tablet (50 mg total) by mouth 2 (two) times daily. (Patient taking differently: Take 25 mg by mouth 2 (two) times daily.) 180 tablet 1   Multiple Vitamin (MULTIVITAMIN WITH MINERALS) TABS tablet Take 1 tablet by mouth daily.     mupirocin  ointment (BACTROBAN ) 2 % Apply liberally to affected area twice a day as directed (Patient taking differently: Apply 1 Application topically 2 (two) times daily as needed (irritation).) 22 g 3   omeprazole  (PRILOSEC) 40 MG capsule Take  1 capsule (40 mg total) by mouth daily before breakfast. (Patient taking differently: Take 40 mg by mouth at bedtime.) 90 capsule 1   ondansetron  (ZOFRAN -ODT) 4 MG disintegrating tablet Take 1 tablet (4 mg total) by mouth every 6 (six) hours as needed for nausea or vomiting. 20 tablet 0   pantoprazole  (PROTONIX ) 40 MG tablet Take 1 tablet (40 mg total) by mouth daily. 30 tablet 1   risankizumab -rzaa (SKYRIZI  PEN) 150 MG/ML pen Inject 1 pen injector subcutaneously every three months 1 mL 3   rosuvastatin  (CRESTOR ) 10 MG tablet Take 1 tablet (10 mg total) by mouth at bedtime. 90 tablet 1   thiamine (VITAMIN B-1) 100 MG tablet Take 100 mg by mouth daily.     traMADol  (ULTRAM ) 50 MG tablet Take 1 tablet (50 mg total) by mouth every 6 (six) hours as needed for moderate pain (pain score 4-6) or severe pain (pain score 7-10). 5 tablet 0   triamcinolone  cream (KENALOG ) 0.1 % Apply a small amount to skin to on lower legs twice a day for 2-3 weeks at a time. (Patient taking differently: Apply 1 Application topically 2 (two) times daily as needed (itching).) 454 g 1   No current facility-administered medications for this visit.    Allergies:   Influenza virus vaccine, Levaquin  [levofloxacin in d5w], Celexa [citalopram], Dextrans, Hydrocodone-acetaminophen , Latex, Oxycodone, and Shiitake mushroom (obsolete)   Social History:  The patient  reports that he has never smoked. He has never used smokeless tobacco. He reports that he does not currently use alcohol. He reports that he does not use drugs.   Family History:  The patient's family history includes Anxiety disorder in his father; Bipolar disorder in his father; CAD in an other family member; Cancer in his maternal grandfather; Depression in his father; Diabetes in his paternal grandfather and paternal grandmother; Drug abuse in his father; Hypertension in his paternal grandmother; Obesity in his mother; Prostate cancer in his paternal grandfather.  ROS:  Please see the history of present illness.    All other systems are reviewed and otherwise negative.   PHYSICAL EXAM:  VS:  There were no vitals taken for this visit. BMI: There is no height or weight on file to calculate BMI. Well nourished, well developed, in no acute distress HEENT: normocephalic, atraumatic Neck: no JVD, carotid bruits or masses Cardiac:    RRR; no significant murmurs, no rubs, or gallops Lungs:     CTA b/l, no wheezing, rhonchi or rales Abd: soft, nontender MS: no deformity or atrophy Ext: 2+ LE Edema with chronic skin changes and significant brawny-type edema he reports is better of late particularly with his weight loss) Skin: warm and dry, no rash Neuro:  No gross deficits appreciated Psych: euthymic mood, full affect   EKG:  Done today and reviewed by myself shows     12/04/20: Coronary CTa IMPRESSION: 1. No evidence of CAD, CADRADS = 0. 2. Coronary calcium  score of 0. 3. Normal coronary origins with co-dominance.   12/03/20: TTE  1. Left ventricular ejection fraction, by estimation, is 60 to 65%. The  left ventricle has normal function. The left ventricle has no regional  wall motion abnormalities. There is mild concentric  left ventricular  hypertrophy. Left ventricular diastolic  parameters were normal.   2. Right ventricular systolic function is normal. The right ventricular  size is normal.   3. The mitral valve is grossly normal. No evidence of mitral valve  regurgitation. No evidence of mitral  stenosis.   4. The aortic valve was not well visualized. Aortic valve regurgitation  is not visualized. No aortic stenosis is present.   Comparison(s): No significant change from prior study.   Conclusion(s)/Recommendation(s): Normal biventricular function without  evidence of hemodynamically significant valvular heart disease.  Technically challenging study, but no significant abnormalities seen.   Recent Labs: 12/10/2023: Magnesium  1.9 01/30/2024: TSH 2.58 02/24/2024: ALT 57; BUN 9; Creatinine, Ser 0.77; Hemoglobin 12.1; Platelets 151; Potassium 4.5; Sodium 135  04/02/2023: Cholesterol 107; HDL 43; LDL Cholesterol 44; Triglycerides 105   CrCl cannot be calculated (Patient's most recent lab result is older than the maximum 21 days allowed.).   Wt Readings from Last 3 Encounters:  03/09/24 (!) 335 lb 3.2 oz (152 kg)  02/23/24 (!) 356 lb 12.8 oz (161.8 kg)  02/18/24 (!) 367 lb (166.5 kg)     Other studies reviewed: Additional studies/records reviewed today include: summarized above  ASSESSMENT AND PLAN:  Assessment and Plan    Paroxysmal A-fib (HCC) NSR today Experiences occasional atrial fibrillation, with recent episodes of tachycardia when metoprolol  is missed. Heart rhythms generally regular, with no recent significant dysrhythmias. Will consider ablation as a lower-risk procedure with continued weight loss. - Reduced metoprolol  to 12.5 mg twice daily. - Use metoprolol  as needed for tachycardia. - Monitor heart rhythms and report any significant changes. - Will consider ablation as a lower-risk procedure with continued weight loss. Zio patch in ~3-4 mos and rtc to establish with [facility] in 4-5  mos to explore if and when Ablation might be appropriate.    Primary hypertension Blood pressure fluctuates between 120s-130s, with occasional low readings of 109/73 causing dizziness and weakness. Current management includes metoprolol , recently reduced to 25 mg due to bradycardia (heart rate in the 40s). Fluid intake is adequate, and urine is clear. No immediate need to adjust losartan  as blood pressure is generally well-controlled. - Reduced metoprolol  to 12.5 mg twice daily. - Monitor blood pressure and heart rate closely. - If bradycardia persists, consider stopping metoprolol . - Use metoprolol  as needed for tachycardia. - Monitor for signs of hypotension and adjust losartan  if necessary.  Encounter for long-term (current) use of high-risk medication On Eliquis  for anticoagulation. Monitoring for signs of bleeding due to potential interaction with bariatric surgery and medication absorption. No current signs of bleeding, but advised to monitor for unusual bruising or black, tarry stools. - Monitor for signs of bleeding, including unusual bruising or black, tarry stools. - Report any signs of bleeding immediately for potential anticoagulant adjustment.  Obstructive sleep apnea Uses CPAP consistently, including during naps. Reports some mask leaks due to weight loss but remains compliant with therapy. No shortness of breath or significant swelling noted. - Continue CPAP therapy. - Address mask leaks with respiratory therapy as needed.  Status post bariatric surgery Underwent gastric bypass surgery on January 12th. Experiencing weight loss and dietary adjustments. Fluid intake is adequate, and urine is clear. Monitoring for nutritional deficiencies, particularly vitamin D , as part of post-surgical care. - Continue monitoring weight and dietary intake. - Ensure adequate fluid intake. - Monitor vitamin D  levels and adjust supplementation as needed.  Recording duration: 17 minutes        HTN Stable - reducing Metoprolol  and may consider reducing Losartan , pending how things progress  Edema Chronic for years, improved if anything   Better with weight loss  Has seen [facility] in the past    Disposition: zIo patch in ~3-4 mos and rtc to establish with [facility]  in 4-5 mos to explore if and when Ablation might be appropriate.     Current medicines are reviewed at length with the patient today.  The patient did not have any concerns regarding medicines.  Bonney Charlies Arthur, PA-C 03/18/2024 12:49 PM     [facility] [address] [place] [address] [phone] (office)  [phone] (fax)  "

## 2024-03-18 NOTE — Telephone Encounter (Signed)
 Spoke with the patient who states that his BP and HR have continued to be low. He states that he has been taking 1/2 tablet (25 mg) of metoprolol  twice daily. He states that his HR will still get into the 50s and he has some dizziness. He held his metoprolol  for 24 hours recently and his heart rate increased to 100. He states that in the morning before medications his blood pressure has been 120-130/70s. This morning after medications his BP was 109/70. He takes losartan  100 mg once daily. He recently had gastric bypass surgery and is wondering if medications needs to be adjusted because of this. He has been scheduled for an appointment tomorrow with an APP to discuss further.

## 2024-03-19 ENCOUNTER — Ambulatory Visit

## 2024-03-19 ENCOUNTER — Encounter: Payer: Self-pay | Admitting: Medical

## 2024-03-19 ENCOUNTER — Other Ambulatory Visit (HOSPITAL_COMMUNITY): Payer: Self-pay

## 2024-03-19 ENCOUNTER — Ambulatory Visit: Admitting: Medical

## 2024-03-19 VITALS — BP 128/72 | HR 63 | Ht 69.5 in | Wt 334.0 lb

## 2024-03-19 DIAGNOSIS — Z79899 Other long term (current) drug therapy: Secondary | ICD-10-CM | POA: Insufficient documentation

## 2024-03-19 DIAGNOSIS — H6991 Unspecified Eustachian tube disorder, right ear: Secondary | ICD-10-CM

## 2024-03-19 DIAGNOSIS — I872 Venous insufficiency (chronic) (peripheral): Secondary | ICD-10-CM

## 2024-03-19 DIAGNOSIS — G4733 Obstructive sleep apnea (adult) (pediatric): Secondary | ICD-10-CM

## 2024-03-19 DIAGNOSIS — I10 Essential (primary) hypertension: Secondary | ICD-10-CM

## 2024-03-19 DIAGNOSIS — I480 Paroxysmal atrial fibrillation: Secondary | ICD-10-CM

## 2024-03-19 MED ORDER — METOPROLOL TARTRATE 25 MG PO TABS
12.5000 mg | ORAL_TABLET | Freq: Two times a day (BID) | ORAL | 3 refills | Status: AC
  Filled 2024-03-19: qty 60, 30d supply, fill #0

## 2024-03-19 NOTE — Progress Notes (Unsigned)
 Enrolled patient for a 14 day Zio XT monitor to be mailed to patients home around 06/14/24  Julian Washington to read

## 2024-03-19 NOTE — Patient Instructions (Addendum)
 Medication Instructions:   Your physician recommends that you continue on your current medications as directed. Please refer to the Current Medication list given to you today.   *If you need a refill on your cardiac medications before your next appointment, please call your pharmacy*   Lab Work: NONE ORDERED  TODAY    If you have labs (blood work) drawn today and your tests are completely normal, you will receive your results only by: MyChart Message (if you have MyChart) OR A paper copy in the mail If you have any lab test that is abnormal or we need to change your treatment, we will call you to review the results.    Testing/Procedures:  Your physician has recommended that you wear an event monitor. Event monitors are medical devices that record the hearts electrical activity. Doctors most often us  these monitors to diagnose arrhythmias. Arrhythmias are problems with the speed or rhythm of the heartbeat. The monitor is a small, portable device. You can wear one while you do your normal daily activities. This is usually used to diagnose what is causing palpitations/syncope (passing out).     Follow-Up: At [facility], you and your health needs are our priority.  As part of our continuing mission to provide you with exceptional heart care, our providers are all part of one team.  This team includes your primary Cardiologist (physician) and Advanced Practice Providers or APPs (Physician Assistants and Nurse Practitioners) who all work together to provide you with the care you need, when you need it.  Your next appointment:  4  MONTHS  Provider:  Donnice Primus, MD     We recommend signing up for the patient portal called MyChart.  Sign up information is provided on this After Visit Summary.  MyChart is used to connect with patients for Virtual Visits (Telemedicine).  Patients are able to view lab/test results, encounter notes, upcoming appointments, etc.  Non-urgent  messages can be sent to your provider as well.   To learn more about what you can do with MyChart, go to [email or web address].   Other Instructions  ZIO XT- Long Term Monitor Instructions  Your physician has requested you wear a ZIO patch monitor for 14 days.  This is a single patch monitor. [employer] supplies one patch monitor per enrollment. Additional stickers are not available. Please do not apply patch if you will be having a Nuclear Stress Test,  Echocardiogram, Cardiac CT, MRI, or Chest Xray during the period you would be wearing the  monitor. The patch cannot be worn during these tests. You cannot remove and re-apply the  ZIO XT patch monitor.  Your ZIO patch monitor will be mailed 3 day USPS to your address on file. It may take 3-5 days  to receive your monitor after you have been enrolled.  Once you have received your monitor, please review the enclosed instructions. Your monitor  has already been registered assigning a specific monitor serial # to you.  Billing and Patient Assistance Program Information  We have supplied [employer] with any of your insurance information on file for billing purposes. [employer] offers a sliding scale Patient Assistance Program for patients that do not have  insurance, or whose insurance does not completely cover the cost of the ZIO monitor.  You must apply for the Patient Assistance Program to qualify for this discounted rate.  To apply, please call [employer] at [phone], select option 4, select option 2, ask to apply for  Patient Assistance Program. Meredeth will  ask your household income, and how many people  are in your household. They will quote your out-of-pocket cost based on that information.  [employer] will also be able to set up a 75-month, interest-free payment plan if needed.  Applying the monitor   Shave hair from upper left chest.  Hold abrader disc by orange tab. Rub abrader in 40 strokes over the upper left chest as  indicated in  your monitor instructions.  Clean area with 4 enclosed alcohol pads. Let dry.  Apply patch as indicated in monitor instructions. Patch will be placed under collarbone on left  side of chest with arrow pointing upward.  Rub patch adhesive wings for 2 minutes. Remove white label marked 1. Remove the white  label marked 2. Rub patch adhesive wings for 2 additional minutes.  While looking in a mirror, press and release button in center of patch. A small green light will  flash 3-4 times. This will be your only indicator that the monitor has been turned on.  Do not shower for the first 24 hours. You may shower after the first 24 hours.  Press the button if you feel a symptom. You will hear a small click. Record Date, Time and  Symptom in the Patient Logbook.  When you are ready to remove the patch, follow instructions on the last 2 pages of Patient  Logbook. Stick patch monitor onto the last page of Patient Logbook.  Place Patient Logbook in the blue and white box. Use locking tab on box and tape box closed  securely. The blue and white box has prepaid postage on it. Please place it in the mailbox as  soon as possible. Your physician should have your test results approximately 7 days after the  monitor has been mailed back to [facility].  Call [facility] Customer Care at [phone] if you have questions regarding  your ZIO XT patch monitor. Call them immediately if you see an orange light blinking on your  monitor.  If your monitor falls off in less than 4 days, contact our Monitor department at [phone].  If your monitor becomes loose or falls off after 4 days call [employer] at [phone] for  suggestions on securing your monitor

## 2024-04-19 ENCOUNTER — Ambulatory Visit: Admitting: Internal Medicine

## 2024-05-21 ENCOUNTER — Encounter: Admitting: Dietician

## 2024-06-17 ENCOUNTER — Ambulatory Visit: Admitting: Student

## 2024-07-28 ENCOUNTER — Ambulatory Visit: Admitting: Student in an Organized Health Care Education/Training Program

## 2025-01-31 ENCOUNTER — Ambulatory Visit: Admitting: Internal Medicine
